# Patient Record
Sex: Female | Born: 1935 | Race: White | Hispanic: No | Smoking: Former smoker
Health system: Southern US, Community
[De-identification: ages and names within clinical notes are randomized; demographics above are authoritative.]

## PROBLEM LIST (undated history)

## (undated) DIAGNOSIS — F419 Anxiety disorder, unspecified: Secondary | ICD-10-CM

## (undated) DIAGNOSIS — M1990 Unspecified osteoarthritis, unspecified site: Secondary | ICD-10-CM

## (undated) DIAGNOSIS — W19XXXA Unspecified fall, initial encounter: Secondary | ICD-10-CM

## (undated) DIAGNOSIS — K2971 Gastritis, unspecified, with bleeding: Secondary | ICD-10-CM

## (undated) DIAGNOSIS — F329 Major depressive disorder, single episode, unspecified: Secondary | ICD-10-CM

## (undated) DIAGNOSIS — K5790 Diverticulosis of intestine, part unspecified, without perforation or abscess without bleeding: Secondary | ICD-10-CM

## (undated) DIAGNOSIS — D649 Anemia, unspecified: Secondary | ICD-10-CM

## (undated) DIAGNOSIS — K2289 Other specified disease of esophagus: Secondary | ICD-10-CM

## (undated) DIAGNOSIS — K31819 Angiodysplasia of stomach and duodenum without bleeding: Secondary | ICD-10-CM

## (undated) DIAGNOSIS — K449 Diaphragmatic hernia without obstruction or gangrene: Secondary | ICD-10-CM

## (undated) DIAGNOSIS — H269 Unspecified cataract: Secondary | ICD-10-CM

## (undated) DIAGNOSIS — F32A Depression, unspecified: Secondary | ICD-10-CM

## (undated) DIAGNOSIS — D539 Nutritional anemia, unspecified: Secondary | ICD-10-CM

## (undated) DIAGNOSIS — E785 Hyperlipidemia, unspecified: Secondary | ICD-10-CM

## (undated) DIAGNOSIS — K635 Polyp of colon: Secondary | ICD-10-CM

## (undated) DIAGNOSIS — R296 Repeated falls: Secondary | ICD-10-CM

## (undated) DIAGNOSIS — Z5189 Encounter for other specified aftercare: Secondary | ICD-10-CM

## (undated) DIAGNOSIS — R011 Cardiac murmur, unspecified: Secondary | ICD-10-CM

## (undated) DIAGNOSIS — K219 Gastro-esophageal reflux disease without esophagitis: Secondary | ICD-10-CM

## (undated) DIAGNOSIS — IMO0001 Reserved for inherently not codable concepts without codable children: Secondary | ICD-10-CM

## (undated) DIAGNOSIS — I350 Nonrheumatic aortic (valve) stenosis: Secondary | ICD-10-CM

## (undated) DIAGNOSIS — I509 Heart failure, unspecified: Secondary | ICD-10-CM

## (undated) DIAGNOSIS — K228 Other specified diseases of esophagus: Secondary | ICD-10-CM

## (undated) DIAGNOSIS — G629 Polyneuropathy, unspecified: Secondary | ICD-10-CM

## (undated) DIAGNOSIS — I10 Essential (primary) hypertension: Secondary | ICD-10-CM

## (undated) DIAGNOSIS — I82409 Acute embolism and thrombosis of unspecified deep veins of unspecified lower extremity: Secondary | ICD-10-CM

## (undated) HISTORY — DX: Other specified disease of esophagus: K22.89

## (undated) HISTORY — PX: TUBAL LIGATION: SHX77

## (undated) HISTORY — PX: CATARACT EXTRACTION, BILATERAL: SHX1313

## (undated) HISTORY — DX: Hyperlipidemia, unspecified: E78.5

## (undated) HISTORY — PX: ORIF WRIST FRACTURE: SHX2133

## (undated) HISTORY — DX: Cardiac murmur, unspecified: R01.1

## (undated) HISTORY — DX: Nutritional anemia, unspecified: D53.9

## (undated) HISTORY — DX: Polyp of colon: K63.5

## (undated) HISTORY — DX: Depression, unspecified: F32.A

## (undated) HISTORY — DX: Essential (primary) hypertension: I10

## (undated) HISTORY — DX: Nonrheumatic aortic (valve) stenosis: I35.0

## (undated) HISTORY — DX: Other specified diseases of esophagus: K22.8

## (undated) HISTORY — DX: Anemia, unspecified: D64.9

## (undated) HISTORY — PX: CHOLECYSTECTOMY: SHX55

## (undated) HISTORY — DX: Major depressive disorder, single episode, unspecified: F32.9

## (undated) HISTORY — DX: Gastritis, unspecified, with bleeding: K29.71

## (undated) HISTORY — DX: Polyneuropathy, unspecified: G62.9

## (undated) HISTORY — DX: Acute embolism and thrombosis of unspecified deep veins of unspecified lower extremity: I82.409

## (undated) HISTORY — DX: Encounter for other specified aftercare: Z51.89

## (undated) HISTORY — DX: Gastro-esophageal reflux disease without esophagitis: K21.9

## (undated) HISTORY — DX: Unspecified cataract: H26.9

## (undated) HISTORY — PX: OTHER SURGICAL HISTORY: SHX169

## (undated) HISTORY — DX: Anxiety disorder, unspecified: F41.9

## (undated) HISTORY — DX: Unspecified osteoarthritis, unspecified site: M19.90

## (undated) HISTORY — DX: Angiodysplasia of stomach and duodenum without bleeding: K31.819

## (undated) HISTORY — DX: Diaphragmatic hernia without obstruction or gangrene: K44.9

## (undated) HISTORY — DX: Diverticulosis of intestine, part unspecified, without perforation or abscess without bleeding: K57.90

## (undated) HISTORY — DX: Reserved for inherently not codable concepts without codable children: IMO0001

## (undated) HISTORY — DX: Heart failure, unspecified: I50.9

---

## 1991-02-06 HISTORY — PX: OTHER SURGICAL HISTORY: SHX169

## 1996-02-06 HISTORY — PX: FOOT SURGERY: SHX648

## 1997-07-07 ENCOUNTER — Other Ambulatory Visit: Admission: RE | Admit: 1997-07-07 | Discharge: 1997-07-07 | Payer: Self-pay | Admitting: Family Medicine

## 1997-07-08 ENCOUNTER — Other Ambulatory Visit: Admission: RE | Admit: 1997-07-08 | Discharge: 1997-07-08 | Payer: Self-pay | Admitting: Family Medicine

## 1997-07-26 ENCOUNTER — Ambulatory Visit (HOSPITAL_COMMUNITY): Admission: RE | Admit: 1997-07-26 | Discharge: 1997-07-26 | Payer: Self-pay | Admitting: Family Medicine

## 1997-08-31 ENCOUNTER — Ambulatory Visit (HOSPITAL_COMMUNITY): Admission: RE | Admit: 1997-08-31 | Discharge: 1997-08-31 | Payer: Self-pay | Admitting: *Deleted

## 1997-09-03 ENCOUNTER — Ambulatory Visit (HOSPITAL_COMMUNITY): Admission: RE | Admit: 1997-09-03 | Discharge: 1997-09-03 | Payer: Self-pay | Admitting: *Deleted

## 1998-03-29 ENCOUNTER — Ambulatory Visit (HOSPITAL_BASED_OUTPATIENT_CLINIC_OR_DEPARTMENT_OTHER): Admission: RE | Admit: 1998-03-29 | Discharge: 1998-03-29 | Payer: Self-pay | Admitting: Podiatry

## 1999-02-11 ENCOUNTER — Encounter: Admission: RE | Admit: 1999-02-11 | Discharge: 1999-02-11 | Payer: Self-pay | Admitting: Orthopedic Surgery

## 1999-02-11 ENCOUNTER — Encounter: Payer: Self-pay | Admitting: Orthopedic Surgery

## 2000-06-18 ENCOUNTER — Ambulatory Visit (HOSPITAL_COMMUNITY): Admission: RE | Admit: 2000-06-18 | Discharge: 2000-06-18 | Payer: Self-pay | Admitting: Family Medicine

## 2000-06-18 ENCOUNTER — Encounter: Payer: Self-pay | Admitting: Family Medicine

## 2000-07-04 ENCOUNTER — Encounter: Payer: Self-pay | Admitting: Family Medicine

## 2000-07-04 ENCOUNTER — Encounter: Admission: RE | Admit: 2000-07-04 | Discharge: 2000-07-04 | Payer: Self-pay | Admitting: Family Medicine

## 2004-01-10 ENCOUNTER — Ambulatory Visit: Payer: Self-pay | Admitting: Family Medicine

## 2004-02-07 ENCOUNTER — Ambulatory Visit: Payer: Self-pay | Admitting: Family Medicine

## 2004-06-29 ENCOUNTER — Ambulatory Visit (HOSPITAL_COMMUNITY): Admission: RE | Admit: 2004-06-29 | Discharge: 2004-06-30 | Payer: Self-pay | Admitting: Otolaryngology

## 2004-07-12 ENCOUNTER — Ambulatory Visit: Payer: Self-pay | Admitting: Family Medicine

## 2004-11-13 ENCOUNTER — Ambulatory Visit: Payer: Self-pay | Admitting: Family Medicine

## 2004-11-15 ENCOUNTER — Ambulatory Visit (HOSPITAL_COMMUNITY): Admission: RE | Admit: 2004-11-15 | Discharge: 2004-11-15 | Payer: Self-pay | Admitting: Family Medicine

## 2005-01-08 ENCOUNTER — Ambulatory Visit: Payer: Self-pay | Admitting: Family Medicine

## 2005-09-25 ENCOUNTER — Ambulatory Visit: Payer: Self-pay | Admitting: Family Medicine

## 2006-08-02 ENCOUNTER — Ambulatory Visit: Payer: Self-pay | Admitting: Family Medicine

## 2006-08-05 DIAGNOSIS — I10 Essential (primary) hypertension: Secondary | ICD-10-CM | POA: Insufficient documentation

## 2006-08-05 DIAGNOSIS — M810 Age-related osteoporosis without current pathological fracture: Secondary | ICD-10-CM | POA: Insufficient documentation

## 2006-08-05 DIAGNOSIS — IMO0002 Reserved for concepts with insufficient information to code with codable children: Secondary | ICD-10-CM

## 2006-08-05 DIAGNOSIS — E785 Hyperlipidemia, unspecified: Secondary | ICD-10-CM

## 2006-08-05 DIAGNOSIS — M1990 Unspecified osteoarthritis, unspecified site: Secondary | ICD-10-CM | POA: Insufficient documentation

## 2006-08-05 DIAGNOSIS — R32 Unspecified urinary incontinence: Secondary | ICD-10-CM

## 2006-08-05 DIAGNOSIS — G608 Other hereditary and idiopathic neuropathies: Secondary | ICD-10-CM

## 2006-08-05 DIAGNOSIS — K219 Gastro-esophageal reflux disease without esophagitis: Secondary | ICD-10-CM | POA: Insufficient documentation

## 2006-08-05 DIAGNOSIS — F341 Dysthymic disorder: Secondary | ICD-10-CM | POA: Insufficient documentation

## 2006-08-05 DIAGNOSIS — D500 Iron deficiency anemia secondary to blood loss (chronic): Secondary | ICD-10-CM

## 2007-02-05 ENCOUNTER — Emergency Department (HOSPITAL_COMMUNITY): Admission: EM | Admit: 2007-02-05 | Discharge: 2007-02-05 | Payer: Self-pay | Admitting: Emergency Medicine

## 2007-07-16 ENCOUNTER — Ambulatory Visit: Payer: Self-pay | Admitting: Family Medicine

## 2007-07-16 LAB — CONVERTED CEMR LAB
Albumin: 4.2 g/dL (ref 3.5–5.2)
CO2: 25 meq/L (ref 19–32)
Chloride: 103 meq/L (ref 96–112)
Creatinine, Ser: 1.27 mg/dL — ABNORMAL HIGH (ref 0.40–1.20)
Glucose, Bld: 84 mg/dL (ref 70–99)
HDL: 75 mg/dL (ref >39)
Hemoglobin: 10.1 g/dL — ABNORMAL LOW (ref 12.0–15.0)
LDL Cholesterol: 63 mg/dL (ref 0–99)
Lymphocytes Relative: 21 % (ref 12–46)
MCV: 93.7 fL (ref 78.0–100.0)
Neutro Abs: 3.4 10*3/uL (ref 1.7–7.7)
RBC: 3.64 M/uL — ABNORMAL LOW (ref 3.87–5.11)
Sodium: 138 meq/L (ref 135–145)
Total CHOL/HDL Ratio: 2.3
VLDL: 32 mg/dL (ref 0–40)
WBC: 5.1 10*3/uL (ref 4.0–10.5)

## 2007-07-31 ENCOUNTER — Ambulatory Visit: Payer: Self-pay | Admitting: Family Medicine

## 2007-09-16 ENCOUNTER — Encounter (INDEPENDENT_AMBULATORY_CARE_PROVIDER_SITE_OTHER): Payer: Self-pay | Admitting: *Deleted

## 2007-09-16 ENCOUNTER — Ambulatory Visit (HOSPITAL_COMMUNITY): Admission: RE | Admit: 2007-09-16 | Discharge: 2007-09-16 | Payer: Self-pay | Admitting: *Deleted

## 2007-12-27 ENCOUNTER — Emergency Department (HOSPITAL_COMMUNITY): Admission: EM | Admit: 2007-12-27 | Discharge: 2007-12-27 | Payer: Self-pay | Admitting: Emergency Medicine

## 2007-12-29 ENCOUNTER — Inpatient Hospital Stay (HOSPITAL_COMMUNITY): Admission: AD | Admit: 2007-12-29 | Discharge: 2008-01-05 | Payer: Self-pay | Admitting: Orthopedic Surgery

## 2007-12-31 ENCOUNTER — Ambulatory Visit: Payer: Self-pay | Admitting: Physical Medicine & Rehabilitation

## 2008-02-09 ENCOUNTER — Inpatient Hospital Stay (HOSPITAL_COMMUNITY): Admission: AD | Admit: 2008-02-09 | Discharge: 2008-02-09 | Payer: Self-pay | Admitting: Internal Medicine

## 2008-03-25 ENCOUNTER — Ambulatory Visit: Payer: Self-pay | Admitting: Family Medicine

## 2008-03-25 LAB — CONVERTED CEMR LAB
BUN: 19 mg/dL (ref 6–23)
Basophils Relative: 0 % (ref 0–1)
Calcium: 9.6 mg/dL (ref 8.4–10.5)
Eosinophils Absolute: 0.1 10*3/uL (ref 0.0–0.7)
HCT: 26.2 % — ABNORMAL LOW (ref 36.0–46.0)
Hemoglobin: 8.2 g/dL — ABNORMAL LOW (ref 12.0–15.0)
MCHC: 31.3 g/dL (ref 30.0–36.0)
MCV: 79.2 fL (ref 78.0–100.0)
Neutrophils Relative %: 73 % (ref 43–77)
Platelets: 318 10*3/uL (ref 150–400)
RBC: 3.31 M/uL — ABNORMAL LOW (ref 3.87–5.11)
Saturation Ratios: 3 % — ABNORMAL LOW (ref 20–55)
Sodium: 130 meq/L — ABNORMAL LOW (ref 135–145)
TIBC: 414 ug/dL (ref 250–470)
WBC: 5.4 10*3/uL (ref 4.0–10.5)

## 2008-12-21 ENCOUNTER — Ambulatory Visit: Payer: Self-pay | Admitting: Family Medicine

## 2008-12-21 LAB — CONVERTED CEMR LAB
Basophils Absolute: 0 10*3/uL (ref 0.0–0.1)
Basophils Relative: 0 % (ref 0–1)
Eosinophils Absolute: 0.2 10*3/uL (ref 0.0–0.7)
Eosinophils Relative: 4 % (ref 0–5)
HCT: 41 % (ref 36.0–46.0)
Hemoglobin: 13.5 g/dL (ref 12.0–15.0)
Lymphocytes Relative: 22 % (ref 12–46)
Lymphs Abs: 1.1 10*3/uL (ref 0.7–4.0)
MCHC: 32.9 g/dL (ref 30.0–36.0)
MCV: 98.3 fL (ref 78.0–100.0)
Monocytes Absolute: 0.5 10*3/uL (ref 0.1–1.0)
Monocytes Relative: 10 % (ref 3–12)
Neutro Abs: 3.2 10*3/uL (ref 1.7–7.7)
Neutrophils Relative %: 64 % (ref 43–77)
Platelets: 252 10*3/uL (ref 150–400)
RBC: 4.17 M/uL (ref 3.87–5.11)
RDW: 12.6 % (ref 11.5–15.5)
Vit D, 25-Hydroxy: 32 ng/mL (ref 30–89)
WBC: 4.9 10*3/uL (ref 4.0–10.5)

## 2009-02-10 ENCOUNTER — Ambulatory Visit: Payer: Self-pay | Admitting: Family Medicine

## 2009-02-10 LAB — CONVERTED CEMR LAB
BUN: 21 mg/dL (ref 6–23)
Calcium: 9.7 mg/dL (ref 8.4–10.5)
Creatinine, Ser: 0.89 mg/dL (ref 0.40–1.20)
Potassium: 4.6 meq/L (ref 3.5–5.3)
Sodium: 135 meq/L (ref 135–145)
Total CHOL/HDL Ratio: 1.8

## 2009-03-15 ENCOUNTER — Ambulatory Visit: Payer: Self-pay | Admitting: Family Medicine

## 2009-06-14 ENCOUNTER — Ambulatory Visit: Payer: Self-pay | Admitting: Family Medicine

## 2009-12-08 ENCOUNTER — Encounter (INDEPENDENT_AMBULATORY_CARE_PROVIDER_SITE_OTHER): Payer: Self-pay | Admitting: Family Medicine

## 2009-12-08 ENCOUNTER — Ambulatory Visit (HOSPITAL_COMMUNITY): Admission: RE | Admit: 2009-12-08 | Discharge: 2009-12-08 | Payer: Self-pay | Admitting: Family Medicine

## 2009-12-08 ENCOUNTER — Ambulatory Visit: Payer: Self-pay | Admitting: Cardiology

## 2010-01-26 ENCOUNTER — Encounter (INDEPENDENT_AMBULATORY_CARE_PROVIDER_SITE_OTHER): Payer: Self-pay | Admitting: Family Medicine

## 2010-01-26 LAB — CONVERTED CEMR LAB
Iron: 26 ug/dL — ABNORMAL LOW (ref 42–145)
Saturation Ratios: 7 % — ABNORMAL LOW (ref 20–55)
TIBC: 382 ug/dL (ref 250–470)
UIBC: 356 ug/dL

## 2010-03-12 ENCOUNTER — Inpatient Hospital Stay (HOSPITAL_COMMUNITY)
Admission: EM | Admit: 2010-03-12 | Discharge: 2010-03-15 | DRG: 378 | Disposition: A | Discharge status: Home / Self Care | Payer: Medicare Other | Attending: Internal Medicine | Admitting: Internal Medicine

## 2010-03-12 ENCOUNTER — Emergency Department (HOSPITAL_COMMUNITY): Payer: Medicare Other

## 2010-03-12 DIAGNOSIS — E785 Hyperlipidemia, unspecified: Secondary | ICD-10-CM | POA: Diagnosis present

## 2010-03-12 DIAGNOSIS — F3289 Other specified depressive episodes: Secondary | ICD-10-CM | POA: Diagnosis present

## 2010-03-12 DIAGNOSIS — D500 Iron deficiency anemia secondary to blood loss (chronic): Secondary | ICD-10-CM | POA: Diagnosis present

## 2010-03-12 DIAGNOSIS — D378 Neoplasm of uncertain behavior of other specified digestive organs: Secondary | ICD-10-CM | POA: Diagnosis present

## 2010-03-12 DIAGNOSIS — I509 Heart failure, unspecified: Secondary | ICD-10-CM | POA: Diagnosis present

## 2010-03-12 DIAGNOSIS — I10 Essential (primary) hypertension: Secondary | ICD-10-CM | POA: Diagnosis present

## 2010-03-12 DIAGNOSIS — D371 Neoplasm of uncertain behavior of stomach: Secondary | ICD-10-CM | POA: Diagnosis present

## 2010-03-12 DIAGNOSIS — K2961 Other gastritis with bleeding: Principal | ICD-10-CM | POA: Diagnosis present

## 2010-03-12 DIAGNOSIS — I359 Nonrheumatic aortic valve disorder, unspecified: Secondary | ICD-10-CM | POA: Diagnosis present

## 2010-03-12 DIAGNOSIS — F329 Major depressive disorder, single episode, unspecified: Secondary | ICD-10-CM | POA: Diagnosis present

## 2010-03-12 DIAGNOSIS — I5022 Chronic systolic (congestive) heart failure: Secondary | ICD-10-CM | POA: Diagnosis present

## 2010-03-12 LAB — CBC
Hemoglobin: 5.4 g/dL — CL (ref 12.0–15.0)
Platelets: 277 10*3/uL (ref 150–400)
RBC: 2.19 MIL/uL — ABNORMAL LOW (ref 3.87–5.11)
RDW: 14.7 % (ref 11.5–15.5)
WBC: 5.2 10*3/uL (ref 4.0–10.5)

## 2010-03-12 LAB — BASIC METABOLIC PANEL
BUN: 30 mg/dL — ABNORMAL HIGH (ref 6–23)
CO2: 25 mEq/L (ref 19–32)
Calcium: 9 mg/dL (ref 8.4–10.5)
Chloride: 104 mEq/L (ref 96–112)
GFR calc non Af Amer: 43 mL/min — ABNORMAL LOW (ref >60)

## 2010-03-12 LAB — POCT CARDIAC MARKERS: CKMB, poc: <1 ng/mL — ABNORMAL LOW (ref 1.0–8.0)

## 2010-03-12 LAB — TROPONIN I: Troponin I: 0.01 ng/mL (ref 0.00–0.06)

## 2010-03-12 LAB — CK TOTAL AND CKMB (NOT AT ARMC): CK, MB: 1.9 ng/mL (ref 0.3–4.0)

## 2010-03-12 LAB — URINALYSIS, ROUTINE W REFLEX MICROSCOPIC
Ketones, ur: NEGATIVE mg/dL
Protein, ur: NEGATIVE mg/dL
Urine Glucose, Fasting: NEGATIVE mg/dL
Urobilinogen, UA: 0.2 mg/dL (ref 0.0–1.0)

## 2010-03-12 LAB — OCCULT BLOOD, POC DEVICE: Fecal Occult Bld: NEGATIVE

## 2010-03-12 LAB — DIFFERENTIAL
Basophils Absolute: 0 10*3/uL (ref 0.0–0.1)
Eosinophils Relative: 1 % (ref 0–5)

## 2010-03-12 LAB — PREPARE RBC (CROSSMATCH)

## 2010-03-13 DIAGNOSIS — D509 Iron deficiency anemia, unspecified: Secondary | ICD-10-CM

## 2010-03-13 DIAGNOSIS — R195 Other fecal abnormalities: Secondary | ICD-10-CM

## 2010-03-13 LAB — COMPREHENSIVE METABOLIC PANEL
ALT: 15 U/L (ref 0–35)
AST: 17 U/L (ref 0–37)
Albumin: 3.4 g/dL — ABNORMAL LOW (ref 3.5–5.2)
Alkaline Phosphatase: 56 U/L (ref 39–117)
BUN: 20 mg/dL (ref 6–23)
CO2: 26 mEq/L (ref 19–32)
Calcium: 9.2 mg/dL (ref 8.4–10.5)
Chloride: 107 mEq/L (ref 96–112)
Creatinine, Ser: 1.17 mg/dL (ref 0.4–1.2)
GFR calc Af Amer: 54 mL/min — ABNORMAL LOW (ref >60)
GFR calc non Af Amer: 45 mL/min — ABNORMAL LOW (ref >60)
Glucose, Bld: 95 mg/dL (ref 70–99)
Potassium: 4 mEq/L (ref 3.5–5.1)
Sodium: 140 mEq/L (ref 135–145)
Total Bilirubin: 0.6 mg/dL (ref 0.3–1.2)
Total Protein: 6.1 g/dL (ref 6.0–8.3)

## 2010-03-13 LAB — IRON AND TIBC
Iron: 11 ug/dL — ABNORMAL LOW (ref 42–135)
Saturation Ratios: 3 % — ABNORMAL LOW (ref 20–55)
TIBC: 412 ug/dL (ref 250–470)

## 2010-03-13 LAB — CARDIAC PANEL(CRET KIN+CKTOT+MB+TROPI)
CK, MB: 1.7 ng/mL (ref 0.3–4.0)
Relative Index: INVALID (ref 0.0–2.5)
Total CK: 73 U/L (ref 7–177)
Troponin I: 0.02 ng/mL (ref 0.00–0.06)

## 2010-03-13 LAB — CBC
HCT: 24.7 % — ABNORMAL LOW (ref 36.0–46.0)
Hemoglobin: 7.9 g/dL — ABNORMAL LOW (ref 12.0–15.0)
MCH: 26.2 pg (ref 26.0–34.0)
MCHC: 32 g/dL (ref 30.0–36.0)
MCV: 82.1 fL (ref 78.0–100.0)
Platelets: 238 10*3/uL (ref 150–400)
RBC: 3.01 MIL/uL — ABNORMAL LOW (ref 3.87–5.11)
RDW: 14.5 % (ref 11.5–15.5)
WBC: 4.5 10*3/uL (ref 4.0–10.5)

## 2010-03-13 LAB — DIFFERENTIAL
Basophils Absolute: 0 10*3/uL (ref 0.0–0.1)
Basophils Relative: 0 % (ref 0–1)
Eosinophils Absolute: 0.1 10*3/uL (ref 0.0–0.7)
Eosinophils Relative: 2 % (ref 0–5)
Lymphocytes Relative: 13 % (ref 12–46)
Lymphs Abs: 0.6 10*3/uL — ABNORMAL LOW (ref 0.7–4.0)
Monocytes Absolute: 0.5 10*3/uL (ref 0.1–1.0)
Monocytes Relative: 10 % (ref 3–12)
Neutro Abs: 3.3 10*3/uL (ref 1.7–7.7)
Neutrophils Relative %: 75 % (ref 43–77)

## 2010-03-13 LAB — HEMOCCULT GUIAC POC 1CARD (OFFICE): Fecal Occult Bld: NEGATIVE

## 2010-03-13 LAB — MAGNESIUM: Magnesium: 2.1 mg/dL (ref 1.5–2.5)

## 2010-03-13 LAB — LACTATE DEHYDROGENASE: LDH: 130 U/L (ref 94–250)

## 2010-03-14 ENCOUNTER — Other Ambulatory Visit: Payer: Self-pay | Admitting: Internal Medicine

## 2010-03-14 DIAGNOSIS — K5790 Diverticulosis of intestine, part unspecified, without perforation or abscess without bleeding: Secondary | ICD-10-CM

## 2010-03-14 DIAGNOSIS — K5730 Diverticulosis of large intestine without perforation or abscess without bleeding: Secondary | ICD-10-CM

## 2010-03-14 DIAGNOSIS — D509 Iron deficiency anemia, unspecified: Secondary | ICD-10-CM

## 2010-03-14 DIAGNOSIS — K31819 Angiodysplasia of stomach and duodenum without bleeding: Secondary | ICD-10-CM

## 2010-03-14 DIAGNOSIS — R195 Other fecal abnormalities: Secondary | ICD-10-CM

## 2010-03-14 DIAGNOSIS — K2971 Gastritis, unspecified, with bleeding: Secondary | ICD-10-CM

## 2010-03-14 DIAGNOSIS — D126 Benign neoplasm of colon, unspecified: Secondary | ICD-10-CM

## 2010-03-14 DIAGNOSIS — K2991 Gastroduodenitis, unspecified, with bleeding: Secondary | ICD-10-CM

## 2010-03-14 HISTORY — DX: Diverticulosis of intestine, part unspecified, without perforation or abscess without bleeding: K57.90

## 2010-03-14 LAB — CROSSMATCH
Unit division: 0
Unit division: 0

## 2010-03-14 LAB — CBC
HCT: 32.5 % — ABNORMAL LOW (ref 36.0–46.0)
MCV: 82.1 fL (ref 78.0–100.0)
RBC: 3.96 MIL/uL (ref 3.87–5.11)
WBC: 5.7 10*3/uL (ref 4.0–10.5)

## 2010-03-14 LAB — SEDIMENTATION RATE: Sed Rate: 13 mm/hr (ref 0–22)

## 2010-03-14 LAB — RETICULOCYTES: Retic Count, Absolute: 63.4 10*3/uL (ref 19.0–186.0)

## 2010-03-15 ENCOUNTER — Encounter (INDEPENDENT_AMBULATORY_CARE_PROVIDER_SITE_OTHER): Payer: Self-pay | Admitting: *Deleted

## 2010-03-15 ENCOUNTER — Encounter: Payer: Self-pay | Admitting: Internal Medicine

## 2010-03-15 ENCOUNTER — Telehealth: Payer: Self-pay | Admitting: Internal Medicine

## 2010-03-15 DIAGNOSIS — K2970 Gastritis, unspecified, without bleeding: Secondary | ICD-10-CM

## 2010-03-15 DIAGNOSIS — K2971 Gastritis, unspecified, with bleeding: Secondary | ICD-10-CM

## 2010-03-15 DIAGNOSIS — K263 Acute duodenal ulcer without hemorrhage or perforation: Secondary | ICD-10-CM

## 2010-03-15 DIAGNOSIS — D1330 Benign neoplasm of unspecified part of small intestine: Secondary | ICD-10-CM

## 2010-03-15 DIAGNOSIS — D500 Iron deficiency anemia secondary to blood loss (chronic): Secondary | ICD-10-CM

## 2010-03-15 DIAGNOSIS — K2990 Gastroduodenitis, unspecified, without bleeding: Secondary | ICD-10-CM

## 2010-03-15 HISTORY — DX: Gastritis, unspecified, with bleeding: K29.71

## 2010-03-15 LAB — PROTEIN ELECTROPH W RFLX QUANT IMMUNOGLOBULINS
Albumin ELP: 57.3 % (ref 55.8–66.1)
Alpha-1-Globulin: 5.9 % — ABNORMAL HIGH (ref 2.9–4.9)
Alpha-2-Globulin: 11.6 % (ref 7.1–11.8)
Gamma Globulin: 9.1 % — ABNORMAL LOW (ref 11.1–18.8)

## 2010-03-15 LAB — HEMOGLOBIN AND HEMATOCRIT, BLOOD: HCT: 30.1 % — ABNORMAL LOW (ref 36.0–46.0)

## 2010-03-15 LAB — TISSUE TRANSGLUTAMINASE, IGG: Tissue Transglut Ab: 6.7 U/mL (ref <20)

## 2010-03-16 LAB — UIFE/LIGHT CHAINS/TP QN, 24-HR UR
Albumin, U: DETECTED
Alpha 2, Urine: DETECTED — AB
Beta, Urine: DETECTED — AB
Free Lambda Lt Chains,Ur: 0.26 mg/dL (ref 0.08–1.01)
Free Lt Chn Excr Rate: 34.63 mg/d
Gamma Globulin, Urine: DETECTED — AB
Total Protein, Urine-Ur/day: 44 mg/d (ref 10–140)

## 2010-03-23 NOTE — Letter (Signed)
Summary: Patient Notice- Polyp Results  [facility]  [place], [place] [address]   Phone: [phone]  Fax: [phone]        March 15, 2010 MRN: [id number]    Sophia [name] [place], [place]  [address]    Dear [name],  I am pleased to inform you that the colon polyp(s) removed during your recent colonoscopy was (were) found to be benign (no cancer detected) upon pathologic examination.The polyp was hyperplactic ( not premalignant)    Should you develop new or worsening symptoms of abdominal pain, bowel habit changes or bleeding from the rectum or bowels, please schedule an evaluation with either your primary care physician or with me.  Additional information/recommendations:  __ No further action with gastroenterology is needed at this time. Please      follow-up with your primary care physician for your other healthcare      needs.  __ Please call [phone] to schedule a return visit to review your      situation.  _x_ Please keep your follow-up visit as already scheduled.  _x_ Continue treatment plan as outlined the day of your exam.  Please call us if you are having persistent problems or have questions about your condition that have not been fully answered at this time.  Sincerely,  Lafayette Dragon MD  This letter has been electronically signed by your physician.  Appended Document: Patient Notice- Polyp Results Letter mailed to patient.

## 2010-03-23 NOTE — Procedures (Signed)
Summary: Colonoscopy  COLONOSCOPY PROCEDURE REPORT  PATIENT:  Sophia [name], Sophia [name]  MR#:  [id number] BIRTHDATE:   Jun 13, 1928, 81 yrs. old   GENDER:   female ENDOSCOPIST:   Lowella Bandy. Olevia Perches, MD REF. BY: Ellsworth Lennox, M.D. PROCEDURE DATE:  03/14/2010 PROCEDURE:  Colonoscopy [id number] ASA CLASS:   Class II INDICATIONS: Iron deficiency anemia last colon 1999, 2009, heme positive stool MEDICATIONS:    Versed 6 mg, Fentanyl 50 mcg  DESCRIPTION OF PROCEDURE:   After the risks benefits and alternatives of the procedure were thoroughly explained, informed consent was obtained.  Digital rectal exam was performed and revealed no rectal masses.   The Pentax Peds Colonoscope [id number] endoscope was introduced through the anus and advanced to the cecum, which was identified by both the appendix and ileocecal valve, without limitations.  The quality of the prep was good, using MoviPrep.  The instrument was then slowly withdrawn as the colon was fully examined. <<PROCEDUREIMAGES>>        <<OLD IMAGES>>  FINDINGS:  A sessile polyp was found. 4 mm sessile polyp at 15 cm The polyp was removed using cold biopsy forceps (see image1).  There were mild diverticular changes in left colon. diverticulosis was found in the sigmoid colon.  This was otherwise a normal examination of the colon (see image4, image2, and image3). tortuous spastic colon, difficult exam   Retroflexed views in the rectum revealed no abnormalities.    The scope was then withdrawn from the patient and the procedure completed.  COMPLICATIONS:   None ENDOSCOPIC IMPRESSION:  1) Sessile polyp  2) Diverticulosis,mild,left sided diverticulosis in the sigmoid colon  3) Otherwise normal examination  nothing to account for heme positive stool, suspect an upper GI source of blood loss RECOMMENDATIONS:  1) Await pathology results  SB capsule endoscopy REPEAT EXAM:   In 5 year(s) for.   _______________________________ Lowella Bandy. Olevia Perches, MD  CC:

## 2010-03-23 NOTE — Procedures (Signed)
Summary: EGD  ENDOSCOPY PROCEDURE REPORT  PATIENT:  Sophia [name], Sophia [name]  MR#:  [id number] BIRTHDATE:   Nov 12, 1928, 81 yrs. old   GENDER:   female  ENDOSCOPIST:   Lowella Bandy. Olevia Perches, MD Referred by: Ellsworth Lennox, M.D.  PROCEDURE DATE:  03/14/2010 PROCEDURE:  EGD with biopsy, [id number] ASA CLASS:   Class II INDICATIONS: iron deficiency anemia, hemoccult positive stool RECURRENT IRON DEFICIENCY ANEMIA, PRIOR NEGATIVE GI WORKUPS  MEDICATIONS:    Versed 4 mg, Fentanyl 50 mcg TOPICAL ANESTHETIC:   Cetacaine Spray  DESCRIPTION OF PROCEDURE:   After the risks benefits and alternatives of the procedure were thoroughly explained, informed consent was obtained.  The Pentax Gastroscope [id number] endoscope was introduced through the mouth and advanced to the second portion of the duodenum, without limitations.  The instrument was slowly withdrawn as the mucosa was fully examined. <<PROCEDUREIMAGES>>              <<OLD IMAGES>>  Moderate gastritis was found in the antrum. hemorrhagic gastritis, low grade bleeding, With standard forceps, a biopsy was obtained and sent to pathology (see image1, image2, and image3).  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 avm vs scope induced trauma, not ablated  Otherwise the examination was normal. With standard forceps, a biopsy was obtained and sent to pathology (see image7). small bowl biopsy to r/o sprue    Retroflexed views revealed no abnormalities.    The scope was then withdrawn from the patient and the procedure completed.  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 stomach may be the source of GIB  avm vs scope induced trauma not ablated RECOMMENDATIONS:  1) Await biopsy results  colonoscopy  if negative, small bowl capsule endoscopy  REPEAT EXAM:   In 0 year(s)  for.   _______________________________ Lowella Bandy. Olevia Perches, MD    CC:

## 2010-03-23 NOTE — Letter (Signed)
Summary: New Patient letter  [facility]  [address] [place], [place] [address]   Phone: [phone]  Fax: [phone]       03/15/2010 MRN: [id number]  Sophia [name] [address] [place], [place]  [address]  Dear Ms. Lucking,  Welcome to the Gastroenterology Division at [facility].    You are scheduled to see Dr.  [name] on April 26, 2010 at 9:00am on the 3rd floor at [facility] [place] [facility].  We ask that you try to arrive at our office 15 minutes prior to your appointment time to allow for check-in.  We would like you to complete the enclosed self-administered evaluation form prior to your visit and bring it with you on the day of your appointment.  We will review it with you.  Also, please bring a complete list of all your medications or, if you prefer, bring the medication bottles and we will list them.  Please bring your insurance card so that we may make a copy of it.  If your insurance requires a referral to see a specialist, please bring your referral form from your primary care physician.  Co-payments are due at the time of your visit and may be paid by cash, check or credit card.     Your office visit will consist of a consult with your physician (includes a physical exam), any laboratory testing he/she may order, scheduling of any necessary diagnostic testing (e.g. x-ray, ultrasound, CT-scan), and scheduling of a procedure (e.g. Endoscopy, Colonoscopy) if required.  Please allow enough time on your schedule to allow for any/all of these possibilities.    If you cannot keep your appointment, please call [phone] to cancel or reschedule prior to your appointment date.  This allows [place] the opportunity to schedule an appointment for another patient in need of care.  If you do not cancel or reschedule by 5 p.m. the business day prior to your appointment date, you will be charged a $50.00 late cancellation/no-show fee.    Thank you for  choosing [facility] for your medical needs.  We appreciate the opportunity to care for you.  Please visit [place] at our website  to learn more about our practice.                     Sincerely,                                                             The Gastroenterology Division

## 2010-03-23 NOTE — Progress Notes (Signed)
Summary: labs in three weeks  Phone Note From Other Clinic   Caller: [place]  Call For: DR Olevia Perches Reason for Call: Schedule Patient Appt Summary of Call: Patient is scheduled to see Dr Olevia Perches on April 26, 2010 and Nevin Bloodgood wants her to have labs (cbc) in three weeks from today, Please put it in the system. Initial call taken by: Ronalee Red,  March 15, 2010 10:39 AM  Follow-up for Phone Call        Lmom for patient to return our call. Patient has a lab appointment in 3 weeks and then a f/u with Dr Donna Christen. Shella Maxim RN  March 15, 2010 2:31 PM   Additional Follow-up for Phone Call Additional follow up Details #1::        Spoke with patient and gave her the date for labs 04/05/10. Additional Follow-up by: Leone Payor RN,  March 17, 2010 8:39 AM

## 2010-03-27 ENCOUNTER — Telehealth: Payer: Self-pay | Admitting: Internal Medicine

## 2010-03-27 NOTE — Discharge Summary (Signed)
Sophia [name], COMMINS NO.:  [id number]  MEDICAL RECORD NO.:  [id number]           PATIENT TYPE:  I  LOCATION:  1520                         FACILITY:  [facility]  PHYSICIAN:  Oren Binet, MD    DATE OF BIRTH:  1928-09-05  DATE OF ADMISSION:  03/12/2010 DATE OF DISCHARGE:                        DISCHARGE SUMMARY - REFERRING   PRIMARY CARE PRACTITIONER:  Barton Fanny, M.D.  PRIMARY GASTROENTEROLOGIST:  Lowella Bandy. Olevia Perches, M.D.  PRIMARY CARDIOLOGIST:  Belva Crome, M.D. of [facility].  PRIMARY DISCHARGE DIAGNOSES: 1. Anemia, likely secondary to chronic gastrointestinal loss. 2. Hemorrhagic gastritis per esophagogastroduodenoscopy.  SECONDARY DISCHARGE DIAGNOSES: 1. Congestive heart failure, mild systolic, compensated currently. 2. Moderate aortic stenosis. 3. Aortic stenosis, moderate. 4. Hypertension. 5. Dyslipidemia. 6. Depression.  DISCHARGE MEDICATIONS: 1. Ferrous sulfate 325 mg 1 tablet p.o. twice daily. 2. Lisinopril 5 mg 1 tablet daily. 3. Xanax 0.5 mg half to one tablet p.o. 3 times a day p.r.n. 4. Protonix 40 mg p.o. daily. 5. EpiPen injection 1 injection subcutaneously daily p.r.n. for hornet     stings. 6. Flonase nasal spray 2 sprays nasally at bedtime. 7. [name] 40 mg 1 tablet p.o. weekly. 8. Neurontin 400 mg 1 capsule p.o. 3 times a day. 9. Hydrocodone/APAP 5/500 half to one tablet p.o. at bedtime. 10.Lidoderm 5% one to three patches topically every 12 hours. 11.Lipitor 40 mg 1 tablet p.o. at bedtime. 12.Verapamil sustained release 240 mg 1 tablet p.o. q.a.m. Mondays,     Wednesdays, and Fridays. 13.Vitamin B12 of 1000 mg 2 tablets p.o. daily. 14.Vitamin D3 of 1000 units 1 tablet p.o. daily. 15.Wellbutrin XL 150 mg p.o. twice daily.  CONSULTATION:  Lowella Bandy. Olevia Perches, M.D. from [facility].  BRIEF HPI:  The patient is a very pleasant 77 year old female who presented to the ED on March 12, 2010, with generalized  weakness. This was going on and off for the past 1 month.  On further evaluation in the emergency room, she was found to have profound anemia with a hemoglobin of 5.4.  She had no evidence of active ongoing GI loss, and she was admitted to the hospitalist service for further evaluation and treatment.  PERTINENT LABORATORY DATA: 1. Hemoglobin on discharge is 9.7. 2. ESR is 13. 3. TSH was 0.879. 4. Fecal occult blood x2 was negative. 5. Anemia, showed an iron level of 11, total iron binding capacity of     412, percent saturation of 3, vitamin B12 of 1206.  RADIOLOGICAL STUDIES:  X-ray of the chest showed stable examination.  No acute cardiopulmonary process.  PROCEDURES PERFORMED:  The patient underwent an EGD and colonoscopy done that was done by Dr. [name] on March 14, 2010.  The EGD showed moderate gastritis in the antrum.  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  The colonoscopy was negative except for a sessile polyp and diverticulosis.  Pathology results from EGD and colonoscopy are currently pending and will need to be followed up.  BRIEF HOSPITAL COURSE: 1. Anemia.  This was profound microcytic with features suggestive of     iron deficiency.  Review of the patient's record did show she had a  EGD and colonoscopy done by Dr. Lajoyce Corners in 2009 which was by and large     negative.  She was admitted to the hospitalist service and given 4     units of PRBC for her profound anemia.  Since the suspicion was for     GI loss, gastroenterology was consulted and the patient was seen in     consult by Dr. Olevia Perches.  She then subsequently underwent an EGD     which basically showed moderate hemorrhagic gastritis with low     grade bleeding in the stomach, biopsy results of which are     currently pending.  Her colonoscopy showed a sessile polyp and     diverticulosis, the biopsy results of which are also pending.  The     patient's hemoglobin and hematocrit have been stable status post      transfusion.  Please do note that this patient's FOBT x3 was     negative, and she does not have any active bleeding.  She has been     placed on proton pump inhibitor and she will continue this.  She     will also continue iron supplementation twice daily.  She also     subsequently underwent a small bowel video capsule, the official     report of which is currently pending.  Per GI note today, the     patient can be discharged to follow up with Dr. Olevia Perches on April 26, 2010, at 9:00 a.m.  She will also have blood work done in 3 weeks'     time at [facility] as well. 2. Hypertension.  This was controlled.  Given her history of mild     systolic dysfunction, she has been placed on lisinopril. 3. Aortic stenosis.  She is to continue regular followup with Dr.     Daneen Schick. 4. Dyslipidemia.  The patient is to continue on a statin.  DISPOSITION:  The patient is considered currently stable to be discharged home.  FOLLOWUP INSTRUCTIONS: 1. The patient will follow up with Dr. Olevia Perches on April 26, 2010, at     9:00 a.m.  She also will follow up with Dr. Olevia Perches in 3 weeks' time     for lab work as well. 2. She will follow up with Dr. Daneen Schick at the next scheduled     appointment. 3. She will follow up Dr. Leward Quan within 1-2 weeks upon discharge. 4. The patient's EGD and colonoscopy biopsy results are currently     pending and this will need to be followed up     when she follows up with her primary physicians. 5. Her small bowel capsule endoscopy is also currently pending and     will need followup for that as well.  TIME SPENT:  45 minutes.     Oren Binet, MD     SG/MEDQ  D:  03/15/2010  T:  03/15/2010  Job:  [id number]  cc:   Lynnell Chad. Shelia Media, M.D. Fax: [phone]  Barton Fanny, M.D. Fax: [place]. Olevia Perches, [place] [address] [place] [address]  Belva Crome, M.D. Fax: [phone]  Electronically Signed by Oren Binet  on  03/27/2010 04:24:18 PM

## 2010-04-01 NOTE — H&P (Signed)
NAMELASONIA, CASAS NO.:  [id number]  MEDICAL RECORD NO.:  [id number]           PATIENT TYPE:  I  LOCATION:  [id number]                         FACILITY[facility]  [facility]  PHYSICIAN:  Ulice Bold, MD   DATE OF BIRTH:  Feb 07, 1928  DATE OF ADMISSION:  03/12/2010 DATE OF DISCHARGE:                             HISTORY & PHYSICAL   CHIEF COMPLAINT:  Generalized weakness.  HISTORY OF PRESENT ILLNESS:  The patient is a 77 year old white female with a past medical history of hypertension, GERD, anemia who presented to the ER with a chief complaint of weakness.  History of present illness dates back to 1 month ago when the patient started having weakness.  The patient describes it as generalized weakness.  The patient does not complain of any exacerbating or relieving factors.  The patient is known to have a history of anemia.  Her last admission and transfusion was in 2010.  The patient's last hemoglobin check was before Christmas, 2011, in which the hemoglobin was told to be 8.  The patient was started on iron supplement, but the patient continued to have progressive weakness and that is the reason she came to the ER.  No complaint of nausea, vomiting, diarrhea.  No complaint of bright red blood per rectum.  No complaint of melena.  No complaint of hematemesis. No complaint of chest pain, shortness of breath.  No complaint of fever, cough, or chills.  No complaint of dizziness, loss of consciousness, or fall.  REVIEW OF SYSTEMS:  Positive for malaise.  ALLERGIES:  The patient is allergic to ACTONEL.  SOCIAL HISTORY:  The patient lives alone.  Nonsmoker.  Nondrinker. Drives.  The patient is able to perform her ADLs and IADLs.  PAST MEDICAL HISTORY:  Positive for: 1. Hypertension. 2. GERD. 3. Anemia. 4. Hiatal hernia. 5. Depression. 6. Hypercholesterolemia. 7. Aortic stenosis. 8. The patient is status post EGD and status post colonoscopy in 2009.  PHYSICAL  EXAMINATION:  VITAL SIGNS:  Blood pressure 129/73, pulse 86, respiratory rate 21, temperature afebrile. GENERAL:  The patient is awake; alert; oriented to time, place and person; is pale looking; resting comfortably on the bed. HEENT:  Conjunctivae is pale.  Sclerae anicteric.  Pupils equally reactive to light and accommodation.  Head atraumatic, normocephalic. RESPIRATORY:  No acute respiratory distress. CHEST:  Clear to auscultation bilaterally.  No crackles or rhonchi were auscultated. CVS:  S1, S2.  Regular rate and rhythm.  Systolic ejection murmur 2/6 was auscultated. GIT:  Bowel sounds present. ABDOMEN:  Soft, nontender, nondistended.  She was FOBT negative as per the ER records. RECTAL:  Deferred. EXTREMITIES:  No lower extremity edema. CNS:  Cranial nerves II through XII were grossly intact.  LABORATORY DATA:  Sodium 136, potassium 4.9, chloride 104, bicarb 25, BUN 30, serum creatinine 1.2, glucose 113, calcium 9, troponin 0.05. EKG shows normal sinus rhythm.  The patient's hemoglobin was 5.4 and the patient's peripheral blood smear had large platelets, stomatocytes and ovalocytes.  IMPRESSION: 1. Anemia.  The patient had a history of anemia, for which GI workup     was done in 2009, which was  nonsignificant besides hemorrhoids.     Her FOBT is negative this evening with her labs showing large     platelets plus stomatocytes plus oval cells plus the patient's age     of more than 39, this could possibly be myelodysplastic syndrome.     We will transfuse 4 units of PRBC.  We will check iron, TIBC,     ferritin, B12, folate.  Before the transfusion, we will get SPEP     and UPEP.  The patient's further clinical course during the     hospital stay will decide whether she needs more workup for her     possible myelodysplastic syndrome/GI as an inpatient. 2. Hypertension.  The patient has a history of hypertension.  We will     decrease the dose of verapamil at this time, as  the patient is     severely anemic. 3. Cardiovascular:  The patient was recently diagnosed with aortic     stenosis, now the hemoglobin is very low.  We will admit to Tele     and cycle troponins. 4. Deep venous thrombosis.  We will keep the patient on Venodyne for     deep venous thrombosis prophylaxis. 5. Osteoporosis.  We will hold the [name] at this time.  Further clinical course depends upon how the patient's clinical situation changes during the hospital stay.     Ulice Bold, MD     MB/MEDQ  D:  03/12/2010  T:  03/13/2010  Job:  [id number]  Electronically Signed by Ulice Bold M.D. on 04/01/2010 08:57:08 AM

## 2010-04-04 NOTE — Procedures (Signed)
Summary: CAPSULE ENDOSCOPY REPORT   ORDERED BY: [name], MD READ BY: Barrington Ellison, MD  REASON FOR REFERRAL: 77 YO FEMALE ADMITTED WITH WEAKNESS AND HGB 5.4. EGD WAS DONE ON 03/14/10 SHOWING SEVERE HEMORRHAGIC GASTRITIS. COLONOSCOPY WITH POYLP AND DIVERTICULOSIS. FE STUDIES CONSISTENT WITH AN IRON DEFICIENCY.  PATIENT DATA: Height: 60.0inches. Weight: 175 lbs. Waist: 36.0inches, Build: Stocky. Gastric Passage time: 0h 45m. Small Bowel Passage Time: 3h 36m.  PROCEDURE INFO & FINDINGS: 1)COMPLETE STUDY, GOOD PREP 2)MARKED HEMORRHAGIC GASTRITIS 3) DUODENTIS 4) A FEW BENIGN-APPEARING DUODENAL POLYPS 5) AT ONE HOUR 34 MINUTES THERE IS AN EXCAVATED LESION WITH DARK PIGMENT IN CENTER CONSISTANT WITH A SMALL, BENIGN-APPEARING SMALL BOWEL ULCER AND THERE IS A NEARBY EROSION  SUMMARY & RECOMMENDATIONS: PER DR. Olevia Perches NEED TO CONSIDER STOPPING [name] GIVEN THE SMALL BOWEL ULCER (AND AVOIDING NSAIDS IF USING)

## 2010-04-04 NOTE — Progress Notes (Signed)
Summary: refills  Phone Note Call from Patient Call back at Home Phone [phone]   Caller: Patient Call For: Dr Olevia Perches Reason for Call: Talk to Nurse Summary of Call: Patient needs refills for her iron meds, pantoprazole and lisinopril  that will run out on 3-9 states that her appt is on 3-28. Initial call taken by: Ronalee Red,  March 27, 2010 12:41 PM  Follow-up for Phone Call        Dr Olevia Perches, I realize this patient needs to get lisinopril from her PCP, however, she is also requesting pantoprazole and iron supplement until her appointment with our office. You have only seen her in hospital for ENDO/COLON and capsule endo. Would you like me to send in medication until her appointment or have her get it from her PCP until she sees you in the office? Follow-up by: Madlyn Frankel CMA Deborra Medina),  March 27, 2010 1:43 PM  Additional Follow-up for Phone Call Additional follow up Details #1::        please refill Additional Follow-up by: Lafayette Dragon MD,  March 27, 2010 2:02 PM    Additional Follow-up for Phone Call Additional follow up Details #2::    Per Dr Olevia Perches, pantoprazole 40 mg once daily and iron once daily. I have advised patient that we will send in these two prescriptions but that she needs to contact her PCP for lisinopril. Follow-up by: Madlyn Frankel CMA Deborra Medina),  March 27, 2010 5:31 PM  New/Updated Medications: PROTONIX 40 MG  TBEC (PANTOPRAZOLE SODIUM) Take 1 tablet by mouth once a day FERROUS SULFATE 325 (65 FE) MG TBEC (FERROUS SULFATE) Take 1 tablet by mouth once a day Prescriptions: FERROUS SULFATE 325 (65 FE) MG TBEC (FERROUS SULFATE) Take 1 tablet by mouth once a day  #30 x 0   Entered by:   Madlyn Frankel CMA (AAMA)   Authorized by:   Lafayette Dragon MD   Signed by:   [place] ([place]) on 03/27/2010   Method used:   Electronically to        [facility]  [id number]* (retail)       [address]    [place], [place]  [address]       Ph: [phone] or [phone]       Fax: [phone]   RxIDUM:[id number] PROTONIX 40 MG  TBEC (PANTOPRAZOLE SODIUM) Take 1 tablet by mouth once a day  #30 x 0   Entered by:   [place] ([place])   Authorized by:   Lafayette Dragon MD   Signed by:   Madlyn Frankel CMA ([place]) on 03/27/2010   Method used:   Electronically to        [facility]  [id number]* (retail)       [address]       [place], [place]  [address]       Ph: [phone] or [phone]       Fax: [phone]   RxID:   [id number]

## 2010-04-05 ENCOUNTER — Other Ambulatory Visit: Payer: Medicare Other

## 2010-04-05 ENCOUNTER — Other Ambulatory Visit: Payer: Self-pay | Admitting: Internal Medicine

## 2010-04-05 ENCOUNTER — Encounter (INDEPENDENT_AMBULATORY_CARE_PROVIDER_SITE_OTHER): Payer: Self-pay | Admitting: *Deleted

## 2010-04-05 DIAGNOSIS — D508 Other iron deficiency anemias: Secondary | ICD-10-CM

## 2010-04-05 LAB — CBC WITH DIFFERENTIAL/PLATELET
Basophils Absolute: 0 10*3/uL (ref 0.0–0.1)
Basophils Relative: 0.3 % (ref 0.0–3.0)
Eosinophils Relative: 1.1 % (ref 0.0–5.0)
HCT: 28.6 % — ABNORMAL LOW (ref 36.0–46.0)
Hemoglobin: 9.6 g/dL — ABNORMAL LOW (ref 12.0–15.0)
Lymphocytes Relative: 12.8 % (ref 12.0–46.0)
Lymphs Abs: 0.8 10*3/uL (ref 0.7–4.0)
Monocytes Relative: 8 % (ref 3.0–12.0)
Neutro Abs: 4.8 10*3/uL (ref 1.4–7.7)
RBC: 3.27 Mil/uL — ABNORMAL LOW (ref 3.87–5.11)
RDW: 22.9 % — ABNORMAL HIGH (ref 11.5–14.6)

## 2010-04-25 NOTE — Op Note (Signed)
Summary: COLON (Dr Lajoyce Corners)  NAME:  Sophia [name], Sophia [name]             ACCOUNT NO.:  [id number]      MEDICAL RECORD NO.:  [id number]          PATIENT TYPE:  AMB      LOCATION:  ENDO                         FACILITY:  [facility]      PHYSICIAN:  Waverly Ferrari, M.D.    DATE OF BIRTH:  August 02, 1928      DATE OF PROCEDURE:  09/16/2007   DATE OF DISCHARGE:                                  OPERATIVE REPORT      PROCEDURE:  Colonoscopy.      INDICATIONS:  Anemia, rule out GI bleed.      ANESTHESIA:  Fentanyl 50 mcg, Versed 4 mg.      PROCEDURE:  With the patient mildly sedated in the left lateral   decubitus position, Pentax videoscopic colonoscope was inserted in the   rectum and passed under direct vision with pressure applied to the   cecum, identified by ileocecal valve and appendiceal orifice, both of   which were photographed.  From this point, the colonoscope was slowly   withdrawn taking circumferential views of colonic mucosa, stopping to   suction greenish fecal debris as we withdrew, stopping only in the   rectum which appeared normal on direct and showed hemorrhoids on   retroflexed view.  The endoscope was straightened and withdrawn.  The   patient's vital signs and pulse oximeter remained stable.  The patient   tolerated procedure well without apparent complications.      FINDINGS:  Some thickening and diverticula noted in the sigmoid colon.   Internal hemorrhoids.  Otherwise an unremarkable exam.  The hemorrhoids   were fairly marked.      PLAN:  See endoscopy note for further details.                  ______________________________   Waverly Ferrari, M.D.            GMO/MEDQ  D:  09/16/2007  T:  09/16/2007  Job:  [id number]      cc:   Barton Fanny, M.D.   Fax: [phone]

## 2010-04-25 NOTE — Op Note (Signed)
Summary: Endoscopy (Dr Orr)/bx  NAME:  Sophia [name], Sophia [name]             ACCOUNT NO.:  [id number]      MEDICAL RECORD NO.:  [id number]          PATIENT TYPE:  AMB      LOCATION:  ENDO                         FACILITY:  [facility]      PHYSICIAN:  Waverly Ferrari, M.D.    DATE OF BIRTH:  03-Oct-1928      DATE OF PROCEDURE:   DATE OF DISCHARGE:                                  OPERATIVE REPORT      PROCEDURE:  Upper endoscopy.      INDICATIONS:  Gastroesophageal reflux disease, hemoccult positivity,   presumably.      ANESTHESIA:  Fentanyl 50 mcg, Versed 6 mg.      PROCEDURE IN DETAIL:  With the patient mildly sedated in the left   lateral decubitus position, the Pentax videoscopic endoscope was   inserted in the mouth, passed under direct vision into the esophagus,   where I saw a fair amount of esophageal spasm, but was able to advance   the endoscope of the plate was able to of that and advance the endoscope   to the GE junction, where a hiatal hernia was noted from above and   photographed.  The endoscope was advanced into stomach.  Fundus, body,   antrum were visualized and the antrum was noted to have diffuse erythema   which was photographed and biopsied.  Duodenal bulb, second portion   duodenum appeared normal.  From this point, the endoscope was slowly   withdrawn, taking circumferential views of duodenal mucosa until the   endoscope had been pulled back into stomach, placed in retroflexion to   view the stomach from below and it was noted that there may be Barrett's   esophagus on retroflexed views, so biopsies were taken of this area   after photographing it as well.  The endoscope was then straightened and   withdrawn, taking circumferential views of the remaining gastric and   esophageal mucosa.  Once again, the hiatal hernia and the esophageal   spasm was once again noted.  The endoscope was withdrawn.  The patient's   vital signs, pulse oximeter remained stable.  The patient  tolerated   procedure well without apparent complications.      FINDINGS:  Esophageal spasm with above hiatal hernia.  Question of   Barrett's esophagus, biopsied and antral erythema, biopsied.  Await   biopsy reports.  The patient will call me for results and followup with   me as an outpatient.                  ______________________________   Waverly Ferrari, M.D.            GMO/MEDQ  D:  09/16/2007  T:  09/16/2007  Job:  [id number]      cc:   Barton Fanny, M.D.   Fax: [phone]    FINAL DIAGNOSIS    ***MICROSCOPIC EXAMINATION AND DIAGNOSIS***    1. 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   - THERE  IS NO EVIDENCE OF DYSPLASIA OR MALIGNANCY.   - SEE COMMENT.    2. STOMACH, ANTRAL, BIOPSY:   - REACTIVE GASTROPATHY.   - THERE IS NO EVIDENCE OF DYSPLASIA OR MALIGNANCY.   - SEE COMMENT.    COMMENT   1. An Alcian Blue stain is performed to determine the presence   of intestinal metaplasia (goblet cell metaplasia). The Alcian   Blue stain shows intestinal metaplasia (goblet cell metaplasia).   The control stained appropriately.   Squamous lined mucosa is not identified. Therefore, if this   biopsy specimen is from the esophagogastric junction, it likely   represents Barrett' s esophagus. However, if the biopsy specimen   is from gastric mucosa, it likely represents chronic gastritis   with associated intestinal metaplasia. Endoscopic correlation is   recommended. Nonetheless, malignant features are not identified.   (JBK:cdc 09/17/07)    2. This pattern can be associated with nonsteroidal   anti-inflammatory drugs (NSAIDS), alcohol or with other causes of   chemical gastropathy. Helicobacter pylori are not identified   with Warthin-Starry stain. The control stained appropriately.   Features of gastric antral vascular ectasia (GAVE) are not   identified. (JBK:mw, 09/17/07)    cc   Date Reported: 09/17/2007  Vonna Kotyk B. Lyndon Code, MD   *** Electronically Signed Out By JBK ***    Clinical information   1) R/O Barrett' s and 2) r/o gastritis, r/o GAVE (tc)    specimen(s) obtained   1: Esophagus, biopsy   2: Stomach, biopsy, antral    Gross Description   1. Received in formalin are tan, soft tissue fragments that are   submitted in toto. Number: Two   Size: 0.1 and 0.2 cm    2. Received in formalin are tan, soft tissue fragments that are   submitted in toto. Number: Two   Size: 0.3 and 0.5 cm (GP:mj 09/16/07)    mj/   Additional Information  [id number] RESULT STATUS : F  External IF Update Timestamp : 2007-09-16:10:07:00.000000

## 2010-04-26 ENCOUNTER — Ambulatory Visit: Payer: Medicare Other | Admitting: Internal Medicine

## 2010-05-03 ENCOUNTER — Ambulatory Visit (INDEPENDENT_AMBULATORY_CARE_PROVIDER_SITE_OTHER): Payer: Medicare Other | Admitting: Internal Medicine

## 2010-05-03 ENCOUNTER — Encounter: Payer: Self-pay | Admitting: Internal Medicine

## 2010-05-03 ENCOUNTER — Other Ambulatory Visit (INDEPENDENT_AMBULATORY_CARE_PROVIDER_SITE_OTHER): Payer: Medicare Other

## 2010-05-03 DIAGNOSIS — D649 Anemia, unspecified: Secondary | ICD-10-CM

## 2010-05-03 DIAGNOSIS — E319 Polyglandular dysfunction, unspecified: Secondary | ICD-10-CM

## 2010-05-03 DIAGNOSIS — K2270 Barrett's esophagus without dysplasia: Secondary | ICD-10-CM

## 2010-05-03 LAB — CBC WITH DIFFERENTIAL/PLATELET
Basophils Relative: 0.5 % (ref 0.0–3.0)
Eosinophils Relative: 0.8 % (ref 0.0–5.0)
Hemoglobin: 9.7 g/dL — ABNORMAL LOW (ref 12.0–15.0)
MCV: 91.6 fl (ref 78.0–100.0)
Monocytes Absolute: 0.5 10*3/uL (ref 0.1–1.0)
Neutro Abs: 3.5 10*3/uL (ref 1.4–7.7)
Neutrophils Relative %: 70.5 % (ref 43.0–77.0)
RBC: 3.12 Mil/uL — ABNORMAL LOW (ref 3.87–5.11)
WBC: 5 10*3/uL (ref 4.5–10.5)

## 2010-05-03 LAB — IBC PANEL
Saturation Ratios: 6.3 % — ABNORMAL LOW (ref 20.0–50.0)
Transferrin: 329.3 mg/dL (ref 212.0–360.0)

## 2010-05-03 MED ORDER — PANTOPRAZOLE SODIUM 40 MG PO TBEC: 40.0000 mg | DELAYED_RELEASE_TABLET | Freq: Every day | ORAL | Status: DC

## 2010-05-03 MED ORDER — FERROUS SULFATE 325 (65 FE) MG PO TABS: ORAL_TABLET | ORAL | Status: DC

## 2010-05-03 NOTE — Patient Instructions (Addendum)
You have been scheduled for an endoscopy. Please follow separate instructions given to you at your visit today. Your physician has requested that you go to the basement for the following lab work before leaving today: CBC, IBC panel. CC: Dr Estanislado Pandy, Dr Leward Quan

## 2010-05-03 NOTE — Progress Notes (Signed)
Sophia [name] [birth year] MRN [id number]   History of Present Illness:  This is a 77 year old, white female who was recently hospitalized for severe anemia. She had a hemoglobin of 5.4. She was iron deficient. She has been on iron supplements including ferrous sulfate twice a day and has been feeling much better. Her energy level has increased. A small bowel capsule endoscopy showed severe gastritis. She has a history of Barrett's esophagus on an upper endoscopy in 2009. Her most recent upper endoscopy in February 2012 showed no Barrett's. A colonoscopy showed polyps and diverticulosis. She has been on [name] for osteoporosis and is followed by Dr Estanislado Pandy for this.   Past Medical History  Diagnosis Date  . Congestive heart failure   . Aortic stenosis   . Hypertension   . Dyslipidemia   . Depression   . Duodenitis 03/15/10    per capsule endoscopy report  . Hemorrhagic gastritis 03/15/10    per capsule endoscopy report  . Ulceration of intestine 03/15/10    small bowel-seen on capsule endoscopy report  . Diverticulosis 03/14/10  . Hyperplastic colon polyp   . Hiatal hernia   . Anemia   . Osteoporosis   . DVT (deep venous thrombosis)   . Anxiety    Past Surgical History  Procedure Date  . Cholecystectomy   . Orif wrist fracture   . Fracture left knee 1993    x 2   . Foot surgery 1998  . Tubal ligation   . Plantar fasciitis repair     left    reports that she has quit smoking. She has never used smokeless tobacco. She reports that she does not drink alcohol or use illicit drugs. family history includes Uterine cancer in her sister. Allergies  Allergen Reactions  . [name]   . [name]         Review of Systems: Negative for dysphagia, abdominal pain nausea or vomiting. Negative for pedal edema or shortness of breath  The remainder of the 10  point ROS is negative except as outlined in H&P   Physical Exam: General appearance  Well developed, in  no distress. Eyes- non icteric. HEENT nontraumatic, normocephalic. Mouth no lesions, tongue papillated, no cheilosis. Neck supple without adenopathy, thyroid not enlarged, no carotid bruits, no JVD. Lungs Clear to auscultation bilaterally. Cor normal S1, normal S2, regular rhythm , 2/6 systolic murmur,  quiet precordium. Abdomen soft mildly obese abdomen with normoactive bowel sounds. No tenderness. No distention. No bruits. Liver edge at costal margin. Rectal: Normal rectal sphincter tone, soft Hemoccult negative stool. Extremities no pedal edema. Skin no lesions. Neurological alert and oriented x 3. Psychological normal mood and affect.  Assessment and Plan:  Problems #1- severe iron deficiency anemia. We will recheck a CBC today. Her anemia is most likely related to severe gastritis which could have been caused by [name]. She has to stay on [name] due to osteoporosis. She is to continue on Protonix 40 mg daily and iron supplements.   Problem #2- History of Barrett's esophagus. Patient has a history of this in 2009. It was not reproduced in February 2012. There was  severe gastritis on a small bowel capsule endoscopy. This needs to be rechecked due to persistent gastritis. If it continues, I would strongly consider stopping [name] and will discuss alternative medication with Dr Estanislado Pandy.   Plan  Repeat upper endoscopy to follow up on hemorrhagic gastritis. Continue Protonix 40 mg daily. Continue ferrous sulfate 325 mg twice a day.  If her iron level and CBC is back to normal, she may be able to decrease her iron supplements to one a day.      05/03/2010 [name]

## 2010-05-04 ENCOUNTER — Telehealth: Payer: Self-pay | Admitting: *Deleted

## 2010-05-04 ENCOUNTER — Other Ambulatory Visit: Payer: Self-pay | Admitting: *Deleted

## 2010-05-04 MED ORDER — IRON DEXTRAN 50 MG/ML IJ SOLN: INTRAMUSCULAR | Status: DC

## 2010-05-04 MED ORDER — FERROUS SULFATE 325 (65 FE) MG PO TABS: ORAL_TABLET | ORAL | Status: DC

## 2010-05-04 NOTE — Telephone Encounter (Signed)
Message copied by Leone Payor on Thu May 04, 2010  8:31 AM ------      Message from: [name]      Created: Wed May 03, 2010  5:19 PM       Please call pt with very low Hgb 9.7, , was 9.6 1 month ago. Iron level still extremely low, despite of taking her FeSO4 325 bid. Please increase to tid and set up an Iron infusion ,Imfed 25mg IV test dose followed by infusion over 4 hrs of Pharmacy calculated dese.

## 2010-05-04 NOTE — Telephone Encounter (Signed)
Spoke with patient and gave her lab results and Dr. Nichola Sizer recommendations. She will increase the Ferrous Sulfate to TID. Pharmacy notified. Scheduled patient to go to [facility] short stay on 05/15/10 at 9:00 AM. Orders faxed to short stay.

## 2010-05-04 NOTE — Telephone Encounter (Signed)
Faxed orders to short stay at [facility]

## 2010-05-04 NOTE — Telephone Encounter (Signed)
Message copied by Leone Payor on Thu May 04, 2010  9:24 AM ------      Message from: [name]      Created: Wed May 03, 2010  5:19 PM       Please call pt with very low Hgb 9.7, , was 9.6 1 month ago. Iron level still extremely low, despite of taking her FeSO4 325 bid. Please increase to tid and set up an Iron infusion ,Imfed 25mg IV test dose followed by infusion over 4 hrs of Pharmacy calculated dese.

## 2010-05-10 ENCOUNTER — Other Ambulatory Visit: Payer: Self-pay | Admitting: Internal Medicine

## 2010-05-10 ENCOUNTER — Encounter: Payer: Self-pay | Admitting: Internal Medicine

## 2010-05-10 ENCOUNTER — Telehealth: Payer: Self-pay | Admitting: Internal Medicine

## 2010-05-10 NOTE — Telephone Encounter (Signed)
Patient notified that Dr. Olevia Perches will examine her hernia tomorrowl

## 2010-05-10 NOTE — Telephone Encounter (Signed)
Patient calling because she wants Dr. Olevia Perches to check her hernia tomorrow when she has the EGD. Patient states she has a runny nose every morning and her daughter told her it was because of her hernia.

## 2010-05-10 NOTE — Telephone Encounter (Signed)
Yes, I am planning to check her esophagus and stomach during the endoscopy tomorrow. It is part of the exam.

## 2010-05-11 ENCOUNTER — Encounter: Payer: Self-pay | Admitting: Internal Medicine

## 2010-05-11 ENCOUNTER — Other Ambulatory Visit: Payer: Self-pay | Admitting: Internal Medicine

## 2010-05-11 ENCOUNTER — Ambulatory Visit (AMBULATORY_SURGERY_CENTER): Payer: Medicare Other | Admitting: Internal Medicine

## 2010-05-11 VITALS — BP 128/73 | HR 74 | Temp 97.9°F | Resp 18 | Ht 60.0 in | Wt 180.0 lb

## 2010-05-11 DIAGNOSIS — K2990 Gastroduodenitis, unspecified, without bleeding: Secondary | ICD-10-CM

## 2010-05-11 DIAGNOSIS — D509 Iron deficiency anemia, unspecified: Secondary | ICD-10-CM

## 2010-05-11 DIAGNOSIS — K2961 Other gastritis with bleeding: Secondary | ICD-10-CM

## 2010-05-11 DIAGNOSIS — K2980 Duodenitis without bleeding: Secondary | ICD-10-CM

## 2010-05-11 DIAGNOSIS — R6889 Other general symptoms and signs: Secondary | ICD-10-CM

## 2010-05-11 DIAGNOSIS — D649 Anemia, unspecified: Secondary | ICD-10-CM

## 2010-05-11 DIAGNOSIS — R195 Other fecal abnormalities: Secondary | ICD-10-CM

## 2010-05-11 DIAGNOSIS — D500 Iron deficiency anemia secondary to blood loss (chronic): Secondary | ICD-10-CM

## 2010-05-11 DIAGNOSIS — D508 Other iron deficiency anemias: Secondary | ICD-10-CM

## 2010-05-11 DIAGNOSIS — K319 Disease of stomach and duodenum, unspecified: Secondary | ICD-10-CM

## 2010-05-11 MED ORDER — SODIUM CHLORIDE 0.9 % IV SOLN: 500.0000 mL | INTRAVENOUS | Status: DC

## 2010-05-11 NOTE — Patient Instructions (Signed)
Severe gastritis, duodenitis Await pathology Continue PPI See green and blue sheets for additional D/C instructions

## 2010-05-15 ENCOUNTER — Telehealth: Payer: Self-pay | Admitting: Internal Medicine

## 2010-05-15 ENCOUNTER — Telehealth: Payer: Self-pay

## 2010-05-15 ENCOUNTER — Encounter (HOSPITAL_COMMUNITY): Payer: Medicare Other | Attending: Internal Medicine

## 2010-05-15 DIAGNOSIS — D508 Other iron deficiency anemias: Secondary | ICD-10-CM | POA: Insufficient documentation

## 2010-05-15 NOTE — Telephone Encounter (Signed)
Forwarded to Dr. Cristal Generous for review.

## 2010-05-15 NOTE — Telephone Encounter (Signed)

## 2010-05-16 ENCOUNTER — Other Ambulatory Visit (INDEPENDENT_AMBULATORY_CARE_PROVIDER_SITE_OTHER): Payer: Medicare Other

## 2010-05-16 DIAGNOSIS — D500 Iron deficiency anemia secondary to blood loss (chronic): Secondary | ICD-10-CM

## 2010-05-16 LAB — CBC WITH DIFFERENTIAL/PLATELET
Basophils Absolute: 0 10*3/uL (ref 0.0–0.1)
Basophils Relative: 0.5 % (ref 0.0–3.0)
Eosinophils Absolute: 0 10*3/uL (ref 0.0–0.7)
Hemoglobin: 11 g/dL — ABNORMAL LOW (ref 12.0–15.0)
Lymphocytes Relative: 11.3 % — ABNORMAL LOW (ref 12.0–46.0)
Lymphs Abs: 0.6 10*3/uL — ABNORMAL LOW (ref 0.7–4.0)
MCHC: 34.2 g/dL (ref 30.0–36.0)
MCV: 92.7 fl (ref 78.0–100.0)
Monocytes Absolute: 0.4 10*3/uL (ref 0.1–1.0)
Neutro Abs: 4.1 10*3/uL (ref 1.4–7.7)
RDW: 17.3 % — ABNORMAL HIGH (ref 11.5–14.6)

## 2010-05-18 ENCOUNTER — Encounter: Payer: Self-pay | Admitting: Internal Medicine

## 2010-05-18 ENCOUNTER — Telehealth: Payer: Self-pay | Admitting: *Deleted

## 2010-05-18 DIAGNOSIS — D509 Iron deficiency anemia, unspecified: Secondary | ICD-10-CM

## 2010-05-18 NOTE — Telephone Encounter (Signed)
Spoke with patient and gave results and recommendations. Lab in [facility] for CBC in 3 months and note to me to call to remind patient when lab is due.

## 2010-05-18 NOTE — Telephone Encounter (Signed)
Message copied by Leone Payor on Thu May 18, 2010  9:45 AM ------      Message from: [name]      Created: Wed May 17, 2010 10:13 PM       Please call pt with improved CBC, from 9.7 up to

## 2010-05-19 ENCOUNTER — Encounter: Payer: Self-pay | Admitting: Internal Medicine

## 2010-05-22 ENCOUNTER — Telehealth: Payer: Self-pay | Admitting: Internal Medicine

## 2010-05-22 LAB — CBC
HCT: 25 % — ABNORMAL LOW (ref 36.0–46.0)
Hemoglobin: 10.7 g/dL — ABNORMAL LOW (ref 12.0–15.0)
Hemoglobin: 8 g/dL — ABNORMAL LOW (ref 12.0–15.0)
MCV: 83.9 fL (ref 78.0–100.0)
Platelets: 333 10*3/uL (ref 150–400)
Platelets: 340 10*3/uL (ref 150–400)
RBC: 2.98 MIL/uL — ABNORMAL LOW (ref 3.87–5.11)
RDW: 15.7 % — ABNORMAL HIGH (ref 11.5–15.5)
WBC: 4.8 10*3/uL (ref 4.0–10.5)
WBC: 5 10*3/uL (ref 4.0–10.5)

## 2010-05-22 LAB — COMPREHENSIVE METABOLIC PANEL
ALT: 17 U/L (ref 0–35)
Albumin: 3.3 g/dL — ABNORMAL LOW (ref 3.5–5.2)
Alkaline Phosphatase: 86 U/L (ref 39–117)
Potassium: 3.9 mEq/L (ref 3.5–5.1)
Sodium: 138 mEq/L (ref 135–145)
Total Protein: 6.2 g/dL (ref 6.0–8.3)

## 2010-05-22 LAB — BASIC METABOLIC PANEL
BUN: 15 mg/dL (ref 6–23)
Calcium: 9.5 mg/dL (ref 8.4–10.5)
Creatinine, Ser: 1.08 mg/dL (ref 0.4–1.2)
GFR calc non Af Amer: 49 mL/min — ABNORMAL LOW (ref >60)
Glucose, Bld: 111 mg/dL — ABNORMAL HIGH (ref 70–99)
Potassium: 4.2 mEq/L (ref 3.5–5.1)

## 2010-05-22 LAB — PROTIME-INR
INR: 2.2 — ABNORMAL HIGH (ref 0.00–1.49)
Prothrombin Time: 25.7 seconds — ABNORMAL HIGH (ref 11.6–15.2)

## 2010-05-22 LAB — IRON AND TIBC
Iron: 33 ug/dL — ABNORMAL LOW (ref 42–135)
TIBC: 380 ug/dL (ref 250–470)
UIBC: 347 ug/dL

## 2010-05-22 LAB — CROSSMATCH

## 2010-05-22 LAB — RETICULOCYTES
RBC.: 3.02 MIL/uL — ABNORMAL LOW (ref 3.87–5.11)
Retic Count, Absolute: 63.4 10*3/uL (ref 19.0–186.0)

## 2010-05-22 LAB — VITAMIN B12: Vitamin B-12: 714 pg/mL (ref 211–911)

## 2010-05-22 MED ORDER — DEXLANSOPRAZOLE 60 MG PO CPDR: 60.0000 mg | DELAYED_RELEASE_CAPSULE | Freq: Every day | ORAL | Status: DC

## 2010-05-22 NOTE — Telephone Encounter (Signed)
Spoke with patient and told her it is okay to go back the [place]. Rx sent to her pharmacy

## 2010-05-22 NOTE — Telephone Encounter (Signed)
OK to switch to Dexilant 60 mg po qd,.,#30, 3 refills.

## 2010-05-22 NOTE — Telephone Encounter (Signed)
Patient calling to report the Protonix is not helping. She has tried it BID. States " I am sick al over, not in pain, just sick all over."  She states her medication was changed when she was in the hospital to Protonix. States she took Dexilant in the past it helped. Is it okay to change her to Dexilant daily?

## 2010-06-20 NOTE — Op Note (Signed)
[name], Sophia [name] NO.:  [id number]   MEDICAL RECORD NO.:  [id number]          PATIENT TYPE:  AMB   LOCATION:  ENDO                         FACILITY:  [facility]   PHYSICIAN:  Waverly Ferrari, M.D.    DATE OF BIRTH:  [birth year]   DATE OF PROCEDURE:  DATE OF DISCHARGE:                               OPERATIVE REPORT   PROCEDURE:  Upper endoscopy.   INDICATIONS:  Gastroesophageal reflux disease, hemoccult positivity,  presumably.   ANESTHESIA:  Fentanyl 50 mcg, Versed 6 mg.   PROCEDURE IN DETAIL:  With the patient mildly sedated in the left  lateral decubitus position, the Pentax videoscopic endoscope was  inserted in the mouth, passed under direct vision into the esophagus,  where I saw a fair amount of esophageal spasm, but was able to advance  the endoscope of the plate was able to of that and advance the endoscope  to the GE junction, where a hiatal hernia was noted from above and  photographed.  The endoscope was advanced into stomach.  Fundus, body,  antrum were visualized and the antrum was noted to have diffuse erythema  which was photographed and biopsied.  Duodenal bulb, second portion  duodenum appeared normal.  From this point, the endoscope was slowly  withdrawn, taking circumferential views of duodenal mucosa until the  endoscope had been pulled back into stomach, placed in retroflexion to  view the stomach from below and it was noted that there may be Barrett's  esophagus on retroflexed views, so biopsies were taken of this area  after photographing it as well.  The endoscope was then straightened and  withdrawn, taking circumferential views of the remaining gastric and  esophageal mucosa.  Once again, the hiatal hernia and the esophageal  spasm was once again noted.  The endoscope was withdrawn.  The patient's  vital signs, pulse oximeter remained stable.  The patient tolerated  procedure well without apparent complications.   FINDINGS:  Esophageal  spasm with above hiatal hernia.  Question of  Barrett's esophagus, biopsied and antral erythema, biopsied.  Await  biopsy reports.  The patient will call me for results and followup with  me as an outpatient.           ______________________________  Waverly Ferrari, M.D.     GMO/MEDQ  D:  09/16/2007  T:  09/16/2007  Job:  [id number]   cc:   Barton Fanny, M.D.  Fax: [phone]

## 2010-06-20 NOTE — H&P (Signed)
NAMEMIRJANA, BRUNELL NO.:  [id number]   MEDICAL RECORD NO.:  [id number]          PATIENT TYPE:  INP   LOCATION:  5009                         FACILITY:  [facility]   PHYSICIAN:  Astrid Divine. Marcelino Scot, M.D. DATE OF BIRTH:  February 15, 1928   DATE OF ADMISSION:  12/29/2007  DATE OF DISCHARGE:                              HISTORY & PHYSICAL   CHIEF COMPLAINT:  Left knee pain/left tibial plateau fracture.   DERMATOLOGIST:  Dr. Estanislado Pandy.   HISTORY OF PRESENT ILLNESS:  This is a 77 year old Caucasian female with  a history of end-stage DJD in her right knee who has sustained a fall on  Saturday, December 27, 2007, in the morning hours on the way to the  store.  The patient was exiting her house when she thought she left her  coffee pot on, she turned around and went back into the house to turn it  off and did so successfully.  As she came out of the house, she slipped  on the top step of her porch and fell down approximately 4 steps, a  total height of probably 7-8 feet.  As a result, she landed on her left  hand, left knee, and also hit her head.  She had the immediate onset of  pain, inability to wear weight on the left leg.  Therefore, she was  brought to [facility] Emergency Department for evaluation of her  injuries.  Upon workup, it was demonstrated that she had a small  avulsion fracture of the volar plate at the middle phalanx at the PIP  joint, in addition to this and more concerning was the left lateral  tibial plateau fracture.  The patient was released from [facility]  Emergency Department with a knee immobilizer and was told to follow up  with orthopedist in about a week.  Since discharge from [facility] ED,  the patient has been experiencing significant amounts of pain and has  had tremendous difficulty mobilizing secondary to her advanced DJD in  her right knee.  The patient has become fairly reliant on family members  to help transport her to the bathroom and to  perform ADLs for her.  As  such, the patient's family contacted our office earlier this morning  stating that the patient has been complaining of a tremendous amount of  pain and is having great difficulty of mobilizing.  Therefore, we  thought prudent to proceed with direct admission for continued  observation and pain control and possible surgical fixation during this  hospital stay.   On clinical encounter, Ms. Dumouchel appears to be well.  She states the  circumstances around her fall which are noted above.  She denies any  loss of consciousness or blacking out prior to, during or after the  fall.  She was not lightheaded or dizzy which resulted in the fall.  She  simply states that she was rushing and tripped which caused her  accident.  She notes that immediately after the accident, she had  immediate onset of pain, was unable to bear weight.  Her pain was at  10/10, and  she needed to go to the emergency room which she did.  Currently, Ms. Mauzey appears to be comfortable.  Her pain is  approximately 6/10, is exacerbated with any type of movement.  She is  getting relief with Percocet that were provided to her upon discharge in  [facility] Emergency Department.  She has noted some swelling in her  left leg and has noted bruising of her foot as well.  She complains of  tenderness primarily along the lateral aspect of her left knee.  She  does not note any tenderness along the medial aspect of her left knee.  Aside from the injuries to her left hand and then contusion to her head,  she did not sustain any additional injuries in the accident.  Ms.  Thevenin notes that her pain is fairly steady and has not changed much  since the accident and pain is sharp and throbbing in nature, primarily  located about her left knee.  No radiation of pain into her upper leg,  into her back or down her leg into her foot.  She does not complain of  any shortness of breath.  No chest pain.  No  abdominal pain.  No nausea,  vomiting, or diarrhea.  No fevers.  No chills.  She has been well up  until this fall.  No recent illnesses.  No recent GI problems.   REVIEW OF SYSTEMS:  Positive for musculoskeletal pain, left knee.  There  is also some issues pertaining to her genitourinary system, sounds as if  there is some slight urinary retention.  She has had recent bowel  movement and says she is pretty regular.   PAST MEDICAL HISTORY:  Significant for:  1. Hypertension.  2. GERD status post upper endoscopy.  3. Osteoarthritis with severe DJD of her right knee and osteoarthritis      of her hands and right shoulder.  4. Hiatal hernia.  5. Neuropathy.  6. Hypercholesterolemia.  7. Depression.   SURGICAL HISTORY:  1. Cholecystectomy.  2. Tubal ligation.  3. Surgical repair for left plantar fasciitis.  4. ORIF, right wrist.  5. Four natural births.   SOCIAL HISTORY:  Ms. Labor has used tobacco products in the past, she  used to smoke approximately 3/4 of a pack a day, was started at the age  of 61 and quit back in 1995.  She denies any alcohol use.  No illicit  drug use.  She does live alone at home.  It is a single level house with  approximately 4 steps outside to enter the house.  She was walker-  dependent prior to admission and prior to this injury.  She also did  utilize a compression knee sleeve on her right knee for her  osteoarthritis.   MEDICATIONS:  1. Forteo 20 mg subcu daily.  2. Prevacid 30 mg p.o. daily.  3. Verapamil 240 mg p.o. daily.  4. Lipitor 40 mg p.o. daily.  5. Gabapentin 400 mg b.i.d.  6. Wellbutrin 150 mg p.o. daily.  7. Darvocet-N 100 p.o. p.r.n.  8. Alprazolam 0.5 mg p.o. t.i.d.  9. Vitamin D 50,000 units.  10.Calcium with vitamin D 3 times a day.  11.Multivitamins p.o. daily.  12.Lidoderm topical patch p.r.n.  13.Epinephrine for anaphylactic reaction to bee stings p.r.n. sting.   FAMILY HISTORY:  Noncontributory.   ALLERGIES:  1.  ACTONEL, the patient states anaphylaxis.  2. Anaphylaxis to BEE STINGS.   PHYSICAL EXAMINATION:  VITAL SIGNS:  Temperature temperature  99.0, heart  rate 86, respirations 20, at 96% on room air, BP 124/57.  GENERAL:  This is a well-developed, well-nourished Caucasian female who  does appear her stated age.  She is in no acute distress and converses  very well, is a good historian and is accompanied by her son.  HEENT:  Normocephalic and atraumatic except for bruise over the left  frontal region appears to be resolving.  Eyes, extraocular muscles are  intact.  Pupils are reactive to accommodation.  Moist mucous membranes  are noted.  NECK:  Supple.  No lymphadenopathy and no bruits.  Full range of motion  and no spinous tenderness is appreciated.  LUNGS:  Clear to auscultation bilaterally.  CARDIAC:  Regular rate and rhythm.  ABDOMEN:  Soft, nontender, nondistended with positive bowel sounds.  PELVIS:  No instability is appreciated with compression of ASIS or iliac  crest bilaterally.  EXTREMITIES:  Bilateral upper extremities did not demonstrate any gross  deformities, no crepitus, no blocked motion with passive range of  motion, the only exception is that there is a splint around the digits 3  and 4 on the left hand splinting the injuries sustained in the fall,  otherwise, no acute findings are noted.  The patient demonstrates full  range of motion at her wrist, elbows, and shoulders and hands  bilaterally which demonstrates good strength, strength on the left side  is restricted somewhat to the splint that is in place.  Sensation along  the radial, ulnar, and median nerves is intact to light touch.  Axillary  nerve sensation is also intact.  Distal motor function along the radial,  ulnar, and median, anterior interosseus, and posterior interosseus  nerves is intact.  There is good skin color.  No skin tensing is noted.  There is a palpable radial pulse bilaterally strong and good.   Brisk  capillary refill is noted.  Right lower extremity, no gross deformities,  crepitus, or blocked motion is noted.  The patient is nontender to  palpation on the soft tissue and compartments.  The patient is able to  demonstrate good active range of motion at the ankle, knee, and hip.  She does not demonstrate any laxity with testing of the knee and ankle.  No tenderness to palpation on the soft tissue or the bony landmarks.  No  swelling is appreciated on right lower extremity.  She has a 2+ dorsalis  pedis pulse.  Sensation along the deep peroneal nerves, superficial  peroneal nerve, and tibial nerve is intact to light touch.  EHL, FHL,  anterior tibialis, posterior tibialis, peroneal, and gastroc soleus  complex demonstrates good motor function.  The patient demonstrates a  good active quad set.  This lower extremity demonstrates good color.  Again, pulses are palpable both the DP and dorsalis pedis.  Brisk  capillary refill.  Skin is warm.  Examination of the left lower  extremity does not demonstrate any gross deformity or malalignment of  the left leg.  There is some swelling appreciated along the lateral  aspect of the left tibia.  A small knee effusion is also appreciated.  Examination of the ankle and the hip are negative for any acute  findings.  The patient did not have any tenderness with palpation of the  hip or the ankle.  The patient is able to range her ankle within a full  range.  Hip and knee range of motion not assessed secondary to pain.  With palpation, the patient does  demonstrate some tenderness along the  lateral aspect of the left tibia along the left lateral tibial plateau.  No tenderness is noted with palpation of the patella.  No tenderness  over the tibial tuberosity.  No tenderness is appreciated with palpation  over the medial aspect of the left knee.  Again, no tenderness with  palpation of the bony landmarks of the hip or the ankle.  Sensory  functions  along the DPN, SPN, TN are intact to light touch.  Extensor  hallucis longus, flexor hallucis longus, anterior tibialis, posterior  tibialis, peroneal, and gastroc-soleus complex are intact with respect  to gross motor testing.  The patient does demonstrate a good active quad  set on the left side, but it is restricted secondary to pain.  There is  2+ dorsalis pedis pulse and 1+ posterior tibialis pulse.  The extremity  has good color and feels warm.  There is some ecchymosis noted on the  dorsal aspect of the left foot as well.  Ecchymosis is also appreciated  along the proximal aspect of the left tibia.  There is some swelling  over the lateral aspect of the left proximal tibia; however, the skin  does wrinkle nicely with gentle compression.   X-rays multiple views of the left knee obtained on December 27, 2007,  demonstrate what appears to be a depressed left lateral tibial plateau  fracture with possibly split fracture also involving the left lateral  tibial plateau.  Also, a fracture involving the left fibular head.   Labs are pending.   ASSESSMENT AND PLAN:  This is 77 year old Caucasian female who sustained  a ground level fall resulting in a left lateral tibial plateau fracture  SHATZKER 2 versus sHATZKER 3, OTA classification 41-B2.1 versus 41-B3.1.  1. We will admit Ms. Nieto to our service for continued observation      and pain control.  Given her physical exam, I feel that we will be      able to perform surgery for definitive plate osteosynthesis of her      tibial plateau fracture tomorrow.  Her skin and soft tissue appears      to be in a good condition to permit safe surgical intervention.  2. We will continue workup of her left tibial plateau fracture by      obtaining a CT scan to further evaluate the amount of depression      involved and in fact there is a split component to her fracture as      well.  This skin is a valuable tool in helping our intraoperative       plan and for correct management of her fracture.  3. After surgery, we will initiate deep vein thrombosis prophylaxis      with Lovenox and most likely bridge her to Coumadin.  We would      anticipate Coumadin for 6 weeks after surgery, but this will be      based on her mobility.  4. Ms. Mcroberts will be on bed rest until surgery is performed.  After      surgery, she will be nonweightbearing on her left lower extremity      for the next 6-8 weeks.  However, we will initiate early gentle      range of motion activity of her left knee to prevent the      development of any types of knee contractures.  She will be fitted      for a [employer]  knee brace on her left leg after surgery to      facilitate this.  We will also consult PT and OT and have care      management involved.  5. I would anticipate that Ms. Lagow will need a skilled nursing      facility after surgery to help her mobilize.  6. I am concerned over the limited function of her right knee prior to      this accident and the impact it will have on her recovery from this      point forward.  We may consider having Ms. Cousar fitted for an      offloader hinged knee brace to provide some additional support,      given the advanced osteoarthritis in her right knee.  We will      obtain an x-ray of her right knee to evaluate the severity of the      disease and to direct [place] as to what type of knee brace should be      provided for the patient.  Ms. Schopp will be n.p.o. after      midnight.  We will give Ancef 1 g IV on call to the OR.  Also      obtain routine labs which will include a CBC, a BMET, UA, and urine      culture over concerns of the urinary retention which may be      attributed to urinary tract infection.   The patient was seen and examined with Dr. Marcelino Scot.      Jari Pigg, PA      Astrid Divine. Marcelino Scot, M.D.  Electronically Signed    KWP/MEDQ  D:  12/29/2007  T:  12/30/2007  Job:  [id number]

## 2010-06-20 NOTE — Op Note (Signed)
NAMETRASHA, RUBERT NO.:  [id number]   MEDICAL RECORD NO.:  [id number]          PATIENT TYPE:  AMB   LOCATION:  ENDO                         FACILITY:  [facility]   PHYSICIAN:  Waverly Ferrari, M.D.    DATE OF BIRTH:  Jun 23, 1928   DATE OF PROCEDURE:  09/16/2007  DATE OF DISCHARGE:                               OPERATIVE REPORT   PROCEDURE:  Colonoscopy.   INDICATIONS:  Anemia, rule out GI bleed.   ANESTHESIA:  Fentanyl 50 mcg, Versed 4 mg.   PROCEDURE:  With the patient mildly sedated in the left lateral  decubitus position, Pentax videoscopic colonoscope was inserted in the  rectum and passed under direct vision with pressure applied to the  cecum, identified by ileocecal valve and appendiceal orifice, both of  which were photographed.  From this point, the colonoscope was slowly  withdrawn taking circumferential views of colonic mucosa, stopping to  suction greenish fecal debris as we withdrew, stopping only in the  rectum which appeared normal on direct and showed hemorrhoids on  retroflexed view.  The endoscope was straightened and withdrawn.  The  patient's vital signs and pulse oximeter remained stable.  The patient  tolerated procedure well without apparent complications.   FINDINGS:  Some thickening and diverticula noted in the sigmoid colon.  Internal hemorrhoids.  Otherwise an unremarkable exam.  The hemorrhoids  were fairly marked.   PLAN:  See endoscopy note for further details.           ______________________________  Waverly Ferrari, M.D.     GMO/MEDQ  D:  09/16/2007  T:  09/16/2007  Job:  [id number]   cc:   Barton Fanny, M.D.  Fax: [phone]

## 2010-06-20 NOTE — Discharge Summary (Signed)
NAMESHINEQUA, BAYONA NO.:  [id number]   MEDICAL RECORD NO.:  [id number]          PATIENT TYPE:  OBV   LOCATION:  [id number]                         FACILITY:  [facility]   PHYSICIAN:  Annita Brod, M.D.DATE OF BIRTH:  12-25-1928   DATE OF ADMISSION:  02/06/2008  DATE OF DISCHARGE:  02/09/2008                               DISCHARGE SUMMARY   PRIMARY CARE PHYSICIAN:  Dr. Lajean Manes.   DISCHARGE DIAGNOSES:  1. Anemia secondary to recent surgery.  2. Status post transfusion 2 units packed red blood cells.  3. Altered mental status felt to be secondary to dehydration and      anemia, now resolved.  4. Muscle weakness and deconditioning.   DISCHARGE MEDICATIONS:  Are as follows:  1. Vitamin D 50,000 units weekly.  2. Wellbutrin 150 p.o. daily.  3. Forteo 20 mcg subcu daily.  4. Simvastatin 80 p.o. q.h.s.  5. Multivitamin daily.  6. Verapamil 40 p.o. daily.  7. Coumadin 3 mg alternating with 4 mg every other day.  8. Neurontin  400 p.o. b.i.d.  9. Prevacid 30 p.o. b.i.d.  10.Xanax 0.25 p.o. t.i.d.  11.Os-Cal D t.i.d.  12.Oxybutynin 10 p.o. q.h.s.  13.EpiPen p.r.n.  14.Methocarbamol 500 p.o. q.6 hours p.r.n. for spasm.  15.Percocet 10/650 1/2 tablet p.o. q.4 hours p.r.n. for pain.  16.Lidoderm patch 5% on for 12 hours at night, off for 12 hours during      the day.   HOSPITAL COURSE:  The patient is a 77 year old white female with recent  knee surgery who was discharged to the [facility] for rehab when she  looked to be having episodes of confusion and weakness.  When her PCP  saw her and ordered routine labs, it was noted that her hemoglobin was  down to 7.8 and at the time of discharge it was 9.6.  The patient was  brought in as a direct admission.  She was typed and crossed and  transfused 2 units of packed red blood cells.  Her hemoglobin was noted  to be on arrival to the 5100 unit at 8.0.  On 2 units of packed red  blood cells, her hemoglobin  improved to 10.7.  She remained stable and  her pressures remained stable as well.  She was continued on her  Coumadin and she has otherwise been improved.  She had some mild  confusion which was felt to be possibly secondary to her anemia and  slightly decreased oxygenation because of her anemia.  This improved  following her blood transfusion and currently she is fully alert and  oriented.  She has been evaluated by PT who recommended the patient go  back to the [facility] for rehab.  This patient will be discharged  back to [facility] skilled nursing on Monday, February 09, 2008.  The  patient's overall disposition is improved.   ACTIVITY:  Will be as per the [facility].   DISCHARGE DIET:  Will be a low-sodium diet.   In regards to her Coumadin, initially it had been scheduled for the  patient to stop her Coumadin on February 10, 2008; however, because of the  patient's slight delay in her recovery because of the blood transfusion,  I would recommend that she stay on Coumadin for 5 more days and stop her  Coumadin on February 15, 2008.   DISCHARGE FOLLOWUP:  The patient follow up with primary care physician,  Dr. Lajean Manes in 2 weeks and her disposition is improved.      Annita Brod, M.D.  Electronically Signed     SKK/MEDQ  D:  02/08/2008  T:  02/08/2008  Job:  [id number]   cc:   Hal T. Stoneking, M.D.

## 2010-06-20 NOTE — Discharge Summary (Signed)
Sophia [name], Sophia [name] NO.:  [id number]   MEDICAL RECORD NO.:  [id number]          PATIENT TYPE:  INP   LOCATION:  5009                         FACILITY:  [facility]   PHYSICIAN:  Astrid Divine. Marcelino Scot, M.D. DATE OF BIRTH:  1928/05/03   DATE OF ADMISSION:  12/29/2007  DATE OF DISCHARGE:  01/05/2008                               DISCHARGE SUMMARY   DISCHARGE DIAGNOSES:  1. Left lateral tibial plateau fracture.  2. Tibial eminence fracture left knee.  3. Right knee osteoarthritis lateral compartment.   ADDITIONAL DIAGNOSES:  1. Hypertension.  2. Gastroesophageal reflux disease.  3. Hiatal hernia.  4. Neuropathy.  5. Hypercholesterolemia.  6. Depression.   PROCEDURE PERFORMED:  On December 30, 2007, ORIF left lateral tibial  plateau, enclosed treatment of tibial eminence fracture using Synthes  locking  proximal tibial plate.   BRIEF HISTORY AND HOSPITAL COURSE:  Ms. Sophia [name] is a very pleasant 77-  year-old Caucasian female who sustained a left tibial plateau fracture  on December 27, 2007, after she sustained a ground-level fall.  Patient  was brought to the emergency room which demonstrated the tibial plateau  fracture, however, she was discharged to home in a knee immobilizer and  was given Percocet for pain control.  Subsequently over the next 2 days,  patient began to have increasing pain and contacted our office.  The  patient was directly admitted to [facility] from our office for  continued observation and pain control with anticipated open reduction  with internal fixation the following day.  After admission, the patient  was prepped for surgery and was made n.p.o. and underwent the procedure  described up above on December 30, 2007, and tolerated the procedure  well.  After a brief stay in the PACU, patient was transported back to  the orthopedic floor for continued observation and pain control.  Immediately postop, patient was started on Lovenox  for DVT prophylaxis  40 mg subcutaneously daily.  Patient's hospital course was relatively  uncomplicated.  However, immediately postoperatively we had some issues  with pain control.  Patient was initially on morphine but was not  receiving any relief from this medication whatsoever.  As such we  switched her Dilaudid 0.5 mg IV every 2 hours which appeared to  alleviate her pain.  Postoperative day #1, patient was doing much  better.  As it appeared, the Dilaudid was helping.  In addition, her  dressing was loosened which helped lessen her pain as well.  Postoperative day #1, her labs did demonstrate a significant drop in her  sodium to a level of 129 as well as her H and H to 8.4 and 25.1 from a  preoperative level of 8.8 and 27.2.  Ms. Sophia [name] also did complain of  some weakness and fatigue which suggested that she was symptomatic  secondary to her acute blood loss anemia.  As such we decided to  transfuse her with 1 unit of packed red blood cells which she tolerated  very well.  Post transfusion specimen demonstrated an H and H of 9.3 and  27.6 the following day.  In addition, the patient was somewhat  hyponatremic and we therefore discontinued her IV fluids and initiated a  fluid restriction of 1000 mL per day.  However, I did have a suspicion  that this may have been a spurious result and given the drastic drop  from her admission sodium which was 137.  Therefore, I ordered another  BMET which did demonstrate a sodium of 135.  As such, her fluid  restriction was discontinued.  Postoperative day #2, patient continued  to do very well.  The patient and her family decided that she would not  go to the [facility].  The drain was pulled and the wound was re-  dressed.  There were no acute findings and the drain was pulled without  complication.  For the remainder of visit, Ms. Cohagan visit was  uncomplicated.  We did order a Bledsoe hinge knee brace for the left  knee as well as  an osteoarthritis brace for her right knee.  We were  very concerned with her ability to begin early mobilization on her right  leg as this has severely advanced DJD.  Ms. Sophia [name] and her family made  it known that prior to the accident she was highly dependent on using  her left leg to compensate for her right leg.  Therefore, we felt that  it was necessary to obtain a custom made osteoarthritis brace for the  right knee to allow for her safe mobilization.  Patient did ultimately  receive both braces and was working with physical therapy fairly well.  Ms. Sophia [name] was still requiring significant assist to help with sit-to-  stand and stand-to-sit as well as transfers to chair as well given her  limited capacity to bare weight on her well right side.  On  postoperative day #6, Ms. Sophia [name] was deemed stable for discharge to  SNF.  She does not have any complaints and is ready to be discharged.  Clinical encounter notes for postoperative day #6 January 05, 2008, is  as follows.   SUBJECTIVE/OBJECTIVE:  Patient is doing very well without any  complaints.  Pain is well-controlled on p.o. medications.  Patient has  received her osteoarthritis brace for her right leg and her Bledsoe  hinge knee brace to the left leg.  She denies any numbness or tingling.  She is tolerating p.o. well.  She has not had any difficulty voiding and  has had a bowel movement.  She is ready for discharge to the SNF.   VITAL SIGNS:  Temperature 98.0.  Heart rate 76.  Respirations 20 at 96%  on room air.  Blood pressure 128/77.  No new labs are noted.  GENERAL:  Patient is awake, alert, no acute distress, comfortable.  LUNGS:  Clear to auscultation bilaterally.  HEART:  Regular rate and rhythm but distant.  ABDOMEN:  Soft, nontender.  Positive bowel sounds.  LOWER EXTREMITIES:  Incision was clean, dry and intact.  Looks  excellent.  No signs of infection and no drainage.  No deep calf  tenderness is noted.   Compartments are soft and nontender.  Deep  peroneal nerve, superficial peroneal nerve and tibial nerve sensation is  intact to light touch.  EHL, FHL, anterior tibialis, posterior tibialis,  peroneales and gastroc motor is intact as well.  Extremities warm.  There is good color with brisk capillary refill.  There is positive  dorsalis pedis pulse as well.   ASSESSMENT AND PLAN:  This is a 77 year old Caucasian female status  post  open reduction with internal fixation left tibial plateau fracture who  continues to improve.  1. Non-weightbearing left lower extremity.  2. Okay for left knee range of motion with a Bledsoe.  No range of      motion should be done without the brace on.  3. Lovenox for deep vein thrombosis prophylaxis and we will start with      Coumadin as well.  Do anticipate that she will have severely      restricted mobility for a period of time and feel that a prolonged      treatment with Coumadin is necessary and warranted.  4. Patient should wear her osteoarthritis brace on the right leg while      ambulating and doing therapy.  5. Daily wound care to her left leg.  The wound may be cleaned with      soap and water only.  No ointments or lotions are to be applied to      the wound.  6. We would discharge the patient to the skilled nursing facility      today.  7. The patient is to follow up at the office in 7-10 days.   DISCHARGE MEDICATIONS:  Are as follows:  1. Norco 5/325 one to two p.o. every 4-6 hours as needed for pain.  2. Robaxin 500 mg 1-2 p.o. every 6 hours as needed for spasms.  3. Lovenox 40 mg 1 subcutaneously daily.  4. Coumadin as directed by pharmacy protocol.   Patient may resume her home medications as follows:  1. Forteo 20 mg subcutaneously daily.  2. Prevacid 30 mg 1 p.o. b.i.d.  3. Verapamil 240 mg p.o. daily.  4. Lipitor 40 mg p.o. daily.  5. Gabapentin 400 mg p.o. b.i.d.  6. Wellbutrin 150 mg p.o. daily.  7. Alprazolam 0.5 mg p.o.  t.i.d.  8. Vitamin D 50,000 units weekly.  9. Multivitamin p.o. daily.  10.Citracal plus D 600 p.o. t.i.d.  11.Lidoderm patch as needed.  12.EpiPen if needed for anaphylactoid reaction.   DISCHARGE INSTRUCTIONS:  Ms. Sharber did sustain a pretty significant  injury to her left leg.  She will be non-weightbearing on her left lower  extremity for at least the next 6-8 weeks as her tibial plateau fracture  heals.  We are greatly concerned given the fact that her condition is  compounded by the severe osteoarthritis in her right knee which makes  mobilization that much more difficult.  We are hopeful that with the  addition of the osteoarthritis brace to her right knee, she will be able  to mobilize more efficiently and safely during rehabilitation.  Ms.  Lagnese needs to wear her Bledsoe hinge knee brace on her left knee at  all times during this acute phase.  The brace should not be locked out  and should permit full range of motion about her left knee.  It is okay  at this point to initiate gentle passive and active range of motion of  her left knee and flexion and extension.  With primary focus at this  point being on achieving a full extension and gradually working on knee  flexion, optimal goal is at 3-4 weeks.  She should have at least 90  degrees of knee flexion.  Ms. Auger will require daily physical  therapy at the skilled nursing facility for close supervision to help  her mobilize safely.  Ms. Midgette will be on long term DVT prophylaxis  given her expected restricted  mobility.  This will continue to be with  Lovenox until her INR becomes therapeutic on Coumadin.  Again, she will  be non-weightbearing for the next 6-8 weeks with gradual weightbearing  thereafter.  We will anticipate that once she begins some partial  weightbearing, she will continue to wear her Bledsoe knee brace on the  left side.   GENERAL WOUND CARE INSTRUCTIONS:  Are as follows:  It is permitted to  cleanse her operative wound with soap and water only.  No lotions, ointments or solutions should be applied at any point in  time to the operative wound as these may be more detrimental to wound  healing.  Nursing staff should be watchful for any signs of infection  which include increased erythema, increased pain, any new drainage or  foul odor.  Should these be noted, the patient is to contact the office  immediately and we will get her in for evaluation.  Ms. Pluff will  follow up with our office in the next 7-10 days for re-evaluation.  At  that time, we will obtain x-rays of her left knee to evaluate for  hardware placement as well as fracture healing.  We will also obtain  range of motion assessment to check her progress thus far and remove her  staples if the surgical wounds permit.  Should the patient or the  nursing facility have any questions at any point in time, they are to  feel free to contact the office at [phone].     Jari Pigg, PA      Astrid Divine. Marcelino Scot, M.D.  Electronically Signed   KWP/MEDQ  D:  01/05/2008  T:  01/05/2008  Job:  [id number]

## 2010-06-20 NOTE — Op Note (Signed)
NAMEJODILYN, Sophia [name] NO.:  [id number]   MEDICAL RECORD NO.:  [id number]          PATIENT TYPE:  INP   LOCATION:  5009                         FACILITY:  [facility]   PHYSICIAN:  Astrid Divine. Marcelino Scot, M.D. DATE OF BIRTH:  1928-03-05   DATE OF PROCEDURE:  12/30/2007  DATE OF DISCHARGE:                               OPERATIVE REPORT   PREOPERATIVE DIAGNOSES:  1. Left unicondylar tibial plateau fracture.  2. Tibial eminence fracture.   POSTOPERATIVE DIAGNOSES:  1. Left unicondylar tibial plateau fracture.  2. Tibial eminence fracture.   PROCEDURES:  1. Open reduction and internal fixation of left tibial plateau.  2. Close treatment of tibial eminence fracture.   SURGEON:  Astrid Divine. Marcelino Scot, MD   ASSISTANT:  Jari Pigg, PA   ANESTHESIA:  General.   FINDINGS:  Osteoporosis.   COMPLICATIONS:  None.   DRAINS:  one.   ESTIMATED BLOOD LOSS:  Minimal.   TOTAL TOURNIQUET TIME:  1 hour and 40 minutes.   DISPOSITION:  PACU.   CONDITION:  Stable.   BRIEF SUMMARY AND INDICATIONS FOR PROCEDURE:  Sophia [name] is a 77-  year-old female who sustained a tibial plateau fracture several days ago  and was discharged from the ED without having been seen in the hospital  by Orthopedics.  She subsequently was admitted directly to me after  having difficulty at home.  When we learned the nature for her injury  and comorbidities including contralateral knee pain, I discussed with  her preoperatively the risks and benefits of surgery including the  possibility of infection, nerve injury, vessel injury, failure to  restore her anatomy, and need for further surgery, and multiple others.  We also discussed alternative treatments including waiting for the  fracture to heal and performing a total knee arthroplasty.  After full  discussion of the risks and benefits, she wished to proceed with ORIF.   BRIEF DESCRIPTION OF PROCEDURE:  Sophia [name] was taken to the operating  room.   After administration of preoperative antibiotics, her left lower  extremity was prepped and draped in the usual sterile fashion.  A  standard anterolateral approach was made to the knee and dissection  carried down to the proximal femur through the anterior compartment as  well as proximal to the joint.  There was extensive depression of the  articular surface, which could be palpated over the retinaculum.  We did  encounter cancellus bleeding, which was not controlled with the Bovie,  and because of the need to work further in the metaphysis, I chose to go  ahead and use the Esmarch bandage to exsanguinate the extremity and then  elevate the tourniquet to 311 mmHg.  I then performed an arthrotomy and  incised the coronary ligament along the base of the plateau.  It was  quite thick in this area and cutting through the meniscus, which was  rock hard and was visualized.  We did not see any large fragments in the  joint nor there was any tear other than what was identified on entry  into the joint of the meniscus itself.  This was repaired back with 0  Prolene suture in vertical mattress fashion.  Attention was then turned  to the proximal tibia.   A cortical window was made in the bone using the half-inch osteotome  trapdoor with a hinge posterior, this was flapped open, and through it,  a series of broad tamps were placed and the lateral compartment elevated  from its severely depressed position.  I then took the lateral plate and  multiple K-wires into the lateral tibial plateau segment and then while  Sophia [name] applied varus force, elevated the plate in the lateral  plateau and then drove K-wires across into the medial side securing  significant improvement in her overall alignment and elevation of the  lateral side.  After this was achieved, we then placed a single lag  screw from the lateral into the medial side, then applied the [employer]  clamp and further compressed it.  Screws  were placed down in the  metaphysis and shaft using a combination of standard fixation which was  placed first followed by locked fixation.  This resulted in a stable  construct.  The large metaphyseal void and the underneath the lateral  plateau was filled with Norian cement, a full 5 mL.  A drain was placed  into the anterior compartment.  Final AP and lateral images showed  appropriate alignment, hardware placement, and length.  Standard layered  closure was performed with 0 Vicryl, 2-0 Vicryl, and staples.  Sterile  gently compressive dressing was applied.  Tourniquet was deflated.  The  patient was placed in knee immobilizer and then taken to the PACU in  stable condition.  Sophia Spinner, PA, assisted throughout the procedure  with reduction, retraction during exposure, placement of provisional  fixation, and simultaneous wound closure to perform this case  appropriately.   PROGNOSIS:  Sophia [name] has severe osteopenia and a severe injury to  the left tibial plateau.  I am concerned about her going on to arthritis  as we were unable to completely restore alignment of her plateau, but it  is very close and much greater than her preoperative position.  I am  hopeful this will translate into a positive outcome for her.  She have  unrestricted range of motion of the knees as soon as her wound allows.  She will need to be on long-term DVT prophylaxis and will be  weightbearing for the next 6-8 weeks with gradual weightbearing  thereafter.      Astrid Divine. Marcelino Scot, M.D.  Electronically Signed     MHH/MEDQ  D:  12/30/2007  T:  12/31/2007  Job:  [id number]

## 2010-06-20 NOTE — H&P (Signed)
NAMEKANYLA, ZEIEN NO.:  [id number]   MEDICAL RECORD NO.:  [id number]          PATIENT TYPE:  OBV   LOCATION:  [id number]                         FACILITY:  [facility]   PHYSICIAN:  Farris Has, MDDATE OF BIRTH:  05-07-1928   DATE OF ADMISSION:  02/06/2008  DATE OF DISCHARGE:                              HISTORY & PHYSICAL   PRIMARY CARE Christiaan Strebeck:  [facility].   The patient lives currently in the [facility].   Request for admission by Dr. Felipa Eth.   CHIEF COMPLAINT:  Abnormal lab values.   HISTORY OF PRESENT ILLNESS:  The patient is a 77 year old lady who  recently has had a fall in November of 2009 that ended up resulting in  depressed left lateral vertebral plateau fracture and left fibular head  fracture.  The patient was admitted.  She had an open reduction and  internal fixation and was discharged to the [facility] with follow-up  and rehab.  The patient was doing relatively well.  With routine labs  Dr. Felipa Eth noticed that her hemoglobin was down to 7.8 yesterday.  At  the time of discharge it was 9.6.  The patient was discharged on  Coumadin, but she does not endorse any melena  or bright red blood per  rectum.  She was minimally symptomatic.  And Dr. Felipa Eth felt that she  would benefit from a transfusion, for which she was admitted.  The  patient otherwise denies any shortness of breath, no chest pain.  Feeling well. No fevers or chills.  The patient has been somewhat  constipated but otherwise review of systems is unremarkable.   PAST MEDICAL HISTORY:  Significant for:  1. Recent lateral tibial fracture.  2. Hypertension.  3. GERD.  4. Hiatal hernia.  5. Neuropathy.  6. Hypercholesterolemia.  7. Depression.   SOCIAL HISTORY:  Significant for the patient is currently living in  [facility].  She previously smoked but quit in 1995.  She does not use  alcohol.   ALLERGIES:  To ACTONEL.  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   MEDICATIONS:  Per the patient's chart:  1. Vitamin D once a week.  2. Albuterol 150 mg daily.  3. __________ 20 mg daily.  4. Simvastatin 80 mg daily.  5. Multivitamins daily.  6. Verapamil 240 mg daily.  7. Coumadin 4 mg every other day.  8. Neurontin 400 mg  p.o. twice daily.  9. Prilosec 20 mg  twice daily.  10.Lidoderm topical patch as needed.  11.Epinephrine pen as needed.  12.Alprazolam 0.25 3 times per day as needed.  13.Citrus calcium with vitamin D.  14.Oxybutynin 10 mg p.o. at bedtime.  15.Prevacid 20 mg p.o. b.i.d.  16.Methocarbamol 500 mg q.6h. as needed for spasm.  17.Hydrocodone/APAP as needed for pain.  18.Lidoderm patch.   VITAL SIGNS:  Temperature 98.4, blood pressure 136/71, pulse 72,  respirations 20, satting initially 92%  on room air.   PHYSICAL EXAMINATION:  GENERAL APPEARANCE:  The patient appears to be in  no acute distress.  HEENT: Head nontraumatic.  Moist mucous membranes.  LUNGS:  Clear to auscultation anteriorly.  The patient is unable totake  deep breaths  HEART: There is a slight systolic murmur noted.  Regular rate and  rhythm.  ABDOMEN: Soft and nontender but obese and nondistended.  EXTREMITIES:  No clubbing, cyanosis, or edema.  Wearing compression  stockings.   LABORATORY RESULTS:  White blood cell count 4.8, hemoglobin 8. Sodium  136, potassium 4.2, creatinine 1.  Chest x-ray showed no acute process.  Of note, Dr. Felipa Eth obtained Hemoccult of her stool which was  negative.   ASSESSMENT AND PLAN:  This is a 77 year old female   ASSESSMENT AND PLAN:  This is a 77 year old female with anemia most  likely secondary to postoperative acute blood loss but will obtain an  anemia panel. Will transfuse 2 units of PRBC and follow-up CBC.  Will  Hemoccult stool. Expect discharge to home in the a.m. if CBC improves.  Will continue her home medications for high blood pressure, depression,  and hyperlipidemia.       Farris Has, MD  Electronically Signed     AVD/MEDQ  D:  02/06/2008  T:  02/06/2008  Job:  [id number]   cc:   Hal T. Stoneking, M.D.

## 2010-06-23 NOTE — Op Note (Signed)
Sophia [name], FOULKES NO.:  [id number]   MEDICAL RECORD NO.:  [id number]          PATIENT TYPE:  OIB   LOCATION:  [id number]                         FACILITY:  [facility]   PHYSICIAN:  Chrystie Nose. Zigmund Daniel, M.D. DATE OF BIRTH:  1928/06/26   DATE OF PROCEDURE:  06/29/2004  DATE OF DISCHARGE:                                 OPERATIVE REPORT   ADMISSION DIAGNOSES:  1.  Dislocated intraocular lens into the vitreous, right eye.  2.  Vitreous opacities, right eye.  3.  Aphakia, right eye.   PROCEDURES:  Pars plana vitrectomy, removal of nonmagnetic foreign body from  vitreous, retinal photocoagulation. gas injection. secondary intraocular  lens placement with suture and membrane peel in the right eye.   SURGEON:  Tempie Hoist, M.D.   ASSISTANT:  Thurman Coyer. Lundquist, P.A.   ANESTHESIA:  General.   DETAILS:  Usual prep and drape, peritomies from 8 o'clock around to 4  o'clock at the limbus.  Scleral flaps were raised at 3 and 9 o'clock in  anticipation of IOL suture.  The 25-gauge trocars were placed at 8, 10 and 2  o'clock.  The Provisc was placed on the corneal surface and biome viewing  system was moved into place.  The pars plana vitrectomy was begun and  vitreous was carefully removed from its attachments to the intraocular lens  and the iris.  A three-layered corneal wound was opened up from 11 o'clock  to 2 o'clock.  The intraocular lens was passed through the pupil and into  the anterior chamber, then out through the limbal wound.  The indirect  ophthalmoscope was moved into place; 842 burns were placed around the  retinal periphery with a power 500 milliwatts, 1000 microns each and 0.1  seconds each.  A new intraocular lens, model CZ70BD from [employer], 21.0 diopters in power, length 12.5 mm, optics 7.0 mm, serial  numbers [id number], was brought onto the field.  The lens was inspected and  cleaned.  Two 10-0 Prolene sutures were placed beneath  the scleral flaps at  3 and 9 o'clock for one ciliary sulcus to the other.  The sutures were  externalized through the pupil and the limbal wound.  Sutures were attached  to the intraocular lens.  The intraocular lens was passed through the limbal  wound into the posterior chamber and positioned behind the iris.  The lens  was dialed into place.  The sutures were drawn securely, knotted and the  free ends removed.  The additional pars plana vitrectomy was carried out at  this point, removing pigment particles and some blood from the vitreous  cavity.  The corneal wound was closed with 4 interrupted 10-0 sutures.  The  wound was tested and found to be tight.  1.0 mL of intravitreal gas was  injected to reinflate the globe.  The conjunctiva was closed with 7-0  chromic suture.  Polymyxin and gentamicin were irrigated into Tenon's space,  Decadron 10 mg was injected to the lower subconjunctival space, Marcaine was  injected around the globe for postop  pain, TobraDex ophthalmic ointment was  placed in the cul-de-sac, above and  below.  The closing pressure was 10 with a Barraquer keratometer.  Complications -- none.  Duration -- 2 hours.  A patch and shield were  placed.  The patient was awakened and taken to recovery in satisfactory  condition.      JDM/MEDQ  D:  06/29/2004  T:  06/30/2004  Job:  [id number]

## 2010-07-26 ENCOUNTER — Telehealth: Payer: Self-pay | Admitting: Internal Medicine

## 2010-07-26 NOTE — Telephone Encounter (Signed)
Patient notified that labs are due on 08/17/10.

## 2010-07-27 ENCOUNTER — Telehealth: Payer: Self-pay | Admitting: Internal Medicine

## 2010-07-27 NOTE — Telephone Encounter (Signed)
Scheduled patient on 07/28/10 at 2:00 PM with Tye Savoy, NP

## 2010-07-27 NOTE — Telephone Encounter (Signed)
Work in with an Conservation officer, historic buildings, next available

## 2010-07-27 NOTE — Telephone Encounter (Signed)
Spoke with patient and she states she is having stomach pain all over her abdomen above belly button after eating breakfast. She is taking Dexilant and uses a heating pad to abdomen. Heat seems to help. She has noticed weakness in her arms for sometime now and she is worried her Hgb is low. Denies bleeding or SOB. (last HGB on 05/16/10- 11.0) Hx hemorrhagic gastritis, iron def. Anemia, Barrettt's esophagus. Last EGD- 05/11/10- severe gastritis, dudenditis, prebyesophagus,"watermelon stomach." Please, advise

## 2010-07-28 ENCOUNTER — Encounter: Payer: Self-pay | Admitting: Nurse Practitioner

## 2010-07-28 ENCOUNTER — Ambulatory Visit (INDEPENDENT_AMBULATORY_CARE_PROVIDER_SITE_OTHER): Payer: Medicare Other | Admitting: Nurse Practitioner

## 2010-07-28 ENCOUNTER — Other Ambulatory Visit (INDEPENDENT_AMBULATORY_CARE_PROVIDER_SITE_OTHER): Payer: Medicare Other

## 2010-07-28 VITALS — BP 98/60 | HR 76 | Ht 60.0 in | Wt 185.0 lb

## 2010-07-28 DIAGNOSIS — R1013 Epigastric pain: Secondary | ICD-10-CM

## 2010-07-28 LAB — CBC WITH DIFFERENTIAL/PLATELET
Basophils Relative: 0.2 % (ref 0.0–3.0)
Eosinophils Relative: 0.1 % (ref 0.0–5.0)
HCT: 28.6 % — ABNORMAL LOW (ref 36.0–46.0)
Hemoglobin: 9.9 g/dL — ABNORMAL LOW (ref 12.0–15.0)
Lymphs Abs: 0.6 10*3/uL — ABNORMAL LOW (ref 0.7–4.0)
MCV: 96 fl (ref 78.0–100.0)
Monocytes Absolute: 0.3 10*3/uL (ref 0.1–1.0)
Neutrophils Relative %: 79.9 % — ABNORMAL HIGH (ref 43.0–77.0)
RBC: 2.98 Mil/uL — ABNORMAL LOW (ref 3.87–5.11)
WBC: 4.8 10*3/uL (ref 4.5–10.5)

## 2010-07-28 LAB — HEPATIC FUNCTION PANEL
ALT: 17 U/L (ref 0–35)
AST: 21 U/L (ref 0–37)
Total Bilirubin: 0.5 mg/dL (ref 0.3–1.2)

## 2010-07-28 NOTE — Patient Instructions (Addendum)
Go directly to the basement today to have your labs drawn. Take Tylenol Extra Strength every 6 hours as needed for pain. Your follow up visit with Dr. Olevia Perches is scheduled for 09/04/10 at 3:30pm. If your symptoms get worse, please call our office before your appointment.

## 2010-07-28 NOTE — Progress Notes (Signed)
Sophia NUR [id number] [birth year]   HISTORY OR PRESENT ILLNESS : [name] is a 77 year old female followed by Dr. Olevia Perches for history of iron deficiency anemia, severe gastritis, duodenitis, and diverticulosis. She was last seen in late March after being hospitalized with severe anemia. Her EGD revealed moderate gastritis and possible gastric AVM vrs. trauma. Colonoscopy unrevealing. Small bowel video capsule study revealed marked hemorrhagic gastritis. Patient had been on [name].  A followup upper endoscopy May 5 of this year showed severe gastritis and duodenitis. There was a suspicion for GAVE but biopsies were compatible with reactive gastropathy.  No H. pylori was identified. Patient gives a several month history of intermittent, gnawing epigastric pain relieved with heat . She doesn't really feel that the pain is GI in origin. No recent GERD, if patient does get heartburn she takes a second Dexilant and pain subsides. Appetitie good, BMs are normal.   Patient complains of weakness in her arms and pain in left upper arm relieved with lidoderm patchs.   Current Medications, Allergies, Past Medical History, Past Surgical History, Family History and Social History were reviewed in [employer] record.   PHYSICAL EXAMINATION : General: Well developed white female in no acute distress Head: Normocephalic and atraumatic Eyes:  sclerae anicteric,conjunctive pink. Ears: Normal auditory acuity Mouth: No deformity or lesions Neck: Supple, no masses.  Lungs: Clear throughout to auscultation Heart: Regular rate and rhythm; no murmurs heard Abdomen: Soft, nondistended, nontender. No masses or hepatomegaly noted. Normal bowel sounds Rectal: Not done Musculoskeletal: Symmetrical with no gross deformities  Skin: No lesions on visible extremities Extremities: No edema or deformities noted Neurological: Alert oriented x 4, grossly nonfocal Cervical Nodes:  No significant  cervical adenopathy Psychological:  Alert and cooperative. Normal mood and affect  ASSESSMENT AND PLAN :

## 2010-07-29 DIAGNOSIS — R1013 Epigastric pain: Secondary | ICD-10-CM | POA: Insufficient documentation

## 2010-07-29 NOTE — Assessment & Plan Note (Addendum)
Intermittent, gnawing epigastric pain. Patient doesn't feel that pain is GI in origin, she may be correct as pain relieved with heat. Her abdomial examination is benign. Patient does have a history of severe gastritis which has hopefully improved since discontinuation of [name].To be on the safe side, will check a CBC as well as LFTs and lipase. Continue daily PPI, heating pad as needed, avoid NSAIDS. May use Tylenol every 6 hours for pain. Patient will be called early next week with test results and any further recommendations.

## 2010-08-01 NOTE — Progress Notes (Signed)
Agree with initial assessment and plans 

## 2010-08-07 ENCOUNTER — Telehealth: Payer: Self-pay | Admitting: *Deleted

## 2010-08-07 DIAGNOSIS — D649 Anemia, unspecified: Secondary | ICD-10-CM

## 2010-08-07 NOTE — Telephone Encounter (Signed)
Message copied by Hulan Saas on Mon Aug 07, 2010 10:05 AM ------      Message from: Willia Craze      Created: Mon Aug 07, 2010  9:23 AM       Rollene Fare, please let patient know that hemoglobin dropped slightly but the previous result of 11.0 may have been falsely elevated as her hemoglobin usually runs in high 9's and that is where she is now. Let's check another CBC in couple of weeks to be on safe side. Thanks

## 2010-08-07 NOTE — Telephone Encounter (Signed)
Patient notified of lab results. She will come for repeat labs on 08/21/10. Note to remind patient with a call.

## 2010-08-14 ENCOUNTER — Telehealth: Payer: Self-pay | Admitting: *Deleted

## 2010-08-14 NOTE — Telephone Encounter (Signed)
Patient notified and she will get labs done.

## 2010-08-14 NOTE — Telephone Encounter (Signed)
Message copied by Hulan Saas on Mon Aug 14, 2010 10:02 AM ------      Message from: Hulan Saas      Created: Thu May 18, 2010  9:49 AM       Due for cbc 7/02/06/10. Call and remind patient

## 2010-08-18 ENCOUNTER — Telehealth: Payer: Self-pay | Admitting: *Deleted

## 2010-08-18 NOTE — Telephone Encounter (Signed)
Message copied by Hulan Saas on Fri Aug 18, 2010  3:32 PM ------      Message from: Hulan Saas      Created: Mon Aug 07, 2010 10:09 AM       Call and remind patient of CBC(PG)

## 2010-08-18 NOTE — Telephone Encounter (Signed)
Called and reminded patient of labs due next week.

## 2010-08-24 ENCOUNTER — Other Ambulatory Visit (INDEPENDENT_AMBULATORY_CARE_PROVIDER_SITE_OTHER): Payer: Medicare Other

## 2010-08-24 DIAGNOSIS — D500 Iron deficiency anemia secondary to blood loss (chronic): Secondary | ICD-10-CM

## 2010-08-24 LAB — CBC WITH DIFFERENTIAL/PLATELET
Eosinophils Absolute: 0 10*3/uL (ref 0.0–0.7)
Eosinophils Relative: 1.2 % (ref 0.0–5.0)
MCV: 97.8 fl (ref 78.0–100.0)
Monocytes Absolute: 0.4 10*3/uL (ref 0.1–1.0)
Neutrophils Relative %: 74.6 % (ref 43.0–77.0)
Platelets: 240 10*3/uL (ref 150.0–400.0)
WBC: 4 10*3/uL — ABNORMAL LOW (ref 4.5–10.5)

## 2010-08-31 ENCOUNTER — Other Ambulatory Visit: Payer: Self-pay | Admitting: Internal Medicine

## 2010-09-01 ENCOUNTER — Telehealth: Payer: Self-pay | Admitting: *Deleted

## 2010-09-01 DIAGNOSIS — D649 Anemia, unspecified: Secondary | ICD-10-CM

## 2010-09-01 NOTE — Telephone Encounter (Signed)
Message copied by Hulan Saas on Fri Sep 01, 2010  8:18 AM ------      Message from: Lafayette Dragon      Created: Tue Aug 29, 2010 10:47 PM       Please call pt with results, her H/H has dropped since last month from 9.9 to 8.7. Please tell her to  Take her IRON AND REPEAT CBC AND IRON STUDIES IN 2 WEEKS.

## 2010-09-01 NOTE — Telephone Encounter (Signed)
Spoke with patient and she states she has an appointment with Dr. Olevia Perches on 09/04/10 and wants to do labs then. She states she has seen black in stools on 08/17/10 and one other time since then.  She states she is getting worked up for a cornea transplant.

## 2010-09-04 ENCOUNTER — Ambulatory Visit (INDEPENDENT_AMBULATORY_CARE_PROVIDER_SITE_OTHER): Payer: Medicare Other | Admitting: Internal Medicine

## 2010-09-04 ENCOUNTER — Encounter: Payer: Self-pay | Admitting: Internal Medicine

## 2010-09-04 VITALS — BP 140/72 | HR 72 | Ht 60.0 in | Wt 187.0 lb

## 2010-09-04 DIAGNOSIS — R195 Other fecal abnormalities: Secondary | ICD-10-CM

## 2010-09-04 DIAGNOSIS — D509 Iron deficiency anemia, unspecified: Secondary | ICD-10-CM

## 2010-09-04 MED ORDER — IRON DEXTRAN 50 MG/ML IJ SOLN: INTRAMUSCULAR | Status: DC

## 2010-09-04 NOTE — Progress Notes (Signed)
Sophia [name] [birth year] MRN [id number]     History of Present Illness:  This is an 77 year old white female with chronic GI blood loss and a recent drop in hemoglobin to 8.7, prior Hgb 9.9 in June 2012. She has chronic iron deficiency anemia. She has been on oral iron supplements 3 times a day. Her last iron infusion was in March 2012. Her iron saturation was 6%. She has chronic gastritis and duodenitis. Her last upper endoscopy was in March 2012. A small bowel capsule endoscopy in March 2012 showed hemorrhagic gastritis and a question of GAVE syndrome. Her last colonoscopy in March 2012 was normal. She has been off Coumadin. Her platelet count is 240,000 with white cells of 4000. She has intermittent epigastric pain. She has been on Dexilant 60 mg daily.   Past Medical History  Diagnosis Date  . Congestive heart failure   . Aortic stenosis   . Hypertension   . Dyslipidemia   . Depression   . Duodenitis 03/15/10    per capsule endoscopy report  . Hemorrhagic gastritis 03/15/10    per capsule endoscopy report  . Diverticulosis 03/14/10  . Hyperplastic colon polyp   . Anemia   . DVT (deep venous thrombosis)   . Anxiety   . Arthritis   . Blood transfusion   . Cataract bil cateract removed  . GERD (gastroesophageal reflux disease)   . Heart murmur   . Hyperlipidemia   . Hiatal hernia   . Osteoporosis   . Neuropathy     bil big toes   Past Surgical History  Procedure Date  . Cholecystectomy   . Orif wrist fracture   . Fracture left knee 1993    x 2   . Foot surgery 1998  . Tubal ligation   . Plantar fasciitis repair     left    reports that she has quit smoking. She has never used smokeless tobacco. She reports that she does not drink alcohol or use illicit drugs. family history includes Uterine cancer in her sister. Allergies  Allergen Reactions  . [name]   . [name]         Review of Systems: Denies dysphagia odynophagia shortness of  breath or chest pain  The remainder of the 10  point ROS is negative except as outlined in H&P   Physical Exam: General appearance  Well developed in no distress, appears pale, ambulates independently   Assessment and Plan  Problem #1 Chronic ongoing GI blood loss from erosive gastritis. 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 She has had a recent drop in hemoglobin from 9.9 to 8.7. We will schedule her for iron infusion of Imfed,1,000 mg over a period of 4-5 hours IV. She will have her CBC rechecked 2 weeks following infusion.   09/04/2010 [name]

## 2010-09-04 NOTE — Patient Instructions (Signed)
We will contact you regarding an appointment for iron infusion.

## 2010-09-08 ENCOUNTER — Encounter (HOSPITAL_COMMUNITY): Payer: Medicare Other | Attending: Internal Medicine

## 2010-09-08 DIAGNOSIS — Z79899 Other long term (current) drug therapy: Secondary | ICD-10-CM | POA: Insufficient documentation

## 2010-09-08 DIAGNOSIS — D509 Iron deficiency anemia, unspecified: Secondary | ICD-10-CM | POA: Insufficient documentation

## 2010-09-10 ENCOUNTER — Other Ambulatory Visit: Payer: Self-pay | Admitting: Internal Medicine

## 2010-09-15 ENCOUNTER — Other Ambulatory Visit (INDEPENDENT_AMBULATORY_CARE_PROVIDER_SITE_OTHER): Payer: Medicare Other

## 2010-09-15 DIAGNOSIS — D509 Iron deficiency anemia, unspecified: Secondary | ICD-10-CM

## 2010-09-15 LAB — CBC WITH DIFFERENTIAL/PLATELET
Basophils Relative: 0.4 % (ref 0.0–3.0)
Eosinophils Relative: 2.6 % (ref 0.0–5.0)
Lymphocytes Relative: 15.2 % (ref 12.0–46.0)
MCV: 101.4 fl — ABNORMAL HIGH (ref 78.0–100.0)
Monocytes Relative: 10.7 % (ref 3.0–12.0)
Neutrophils Relative %: 71.1 % (ref 43.0–77.0)
RBC: 2.49 Mil/uL — ABNORMAL LOW (ref 3.87–5.11)
WBC: 3.4 10*3/uL — ABNORMAL LOW (ref 4.5–10.5)

## 2010-09-18 ENCOUNTER — Telehealth: Payer: Self-pay | Admitting: *Deleted

## 2010-09-18 NOTE — Telephone Encounter (Signed)
Scheduled patient at [facility] short stay on 09/21/10 at 8:00 AM for 2 units PRBC.Orders faxed. Patient notified,

## 2010-09-18 NOTE — Telephone Encounter (Signed)
Message copied by Hulan Saas on Mon Sep 18, 2010  1:54 PM ------      Message from: Lafayette Dragon      Created: Mon Sep 18, 2010  1:29 PM       Please set up for blood transfusion, 2 units IV this week. DB

## 2010-09-21 ENCOUNTER — Encounter (HOSPITAL_COMMUNITY): Payer: Medicare Other

## 2010-09-21 ENCOUNTER — Other Ambulatory Visit: Payer: Self-pay | Admitting: Internal Medicine

## 2010-09-22 LAB — CROSSMATCH
Antibody Screen: NEGATIVE
Unit division: 0

## 2010-10-01 ENCOUNTER — Other Ambulatory Visit: Payer: Self-pay | Admitting: Internal Medicine

## 2010-10-06 ENCOUNTER — Telehealth: Payer: Self-pay | Admitting: Internal Medicine

## 2010-10-06 DIAGNOSIS — D649 Anemia, unspecified: Secondary | ICD-10-CM

## 2010-10-06 NOTE — Telephone Encounter (Signed)
CBC on 9/4 ok

## 2010-10-06 NOTE — Telephone Encounter (Signed)
Spoke with patient and she is feeling weak today. Denies any bleeding or black stools. Not SOB. She had labs on 09/25/10 with Dr. Sallyanne Havers on 09/25/10 Hgb 9.9 and Hct 29.6. She would like to come for CBC on Tuesday. States [place] is [place] to be evaluated for eye surgery and she wants to be sure her Hgb is good enough. Hx chronic ongoing GI blood loss from erosive gastritis Please, advise.

## 2010-10-10 NOTE — Telephone Encounter (Signed)
Spoke with patient and told her she can come today for labs. She states she cannot come today but will come on 10/10/10

## 2010-10-12 ENCOUNTER — Encounter: Payer: Self-pay | Admitting: Internal Medicine

## 2010-10-12 ENCOUNTER — Telehealth: Payer: Self-pay | Admitting: *Deleted

## 2010-10-12 DIAGNOSIS — D649 Anemia, unspecified: Secondary | ICD-10-CM

## 2010-10-12 NOTE — Telephone Encounter (Signed)
Error

## 2010-10-12 NOTE — Telephone Encounter (Signed)
Called patient to check on why she did not come for CBC and she states she had to leave her MD visit without being seen today because she did not sleep last night and she could not come for labs. She states she does not have a ride to get here and she is not sure when she can come. She has an appointment on 10/18/10 at [facility] for her eyes and is not sure she can come before then.

## 2010-10-12 NOTE — Telephone Encounter (Signed)
Message copied by Hulan Saas on Thu Oct 12, 2010  4:35 PM ------      Message from: Hulan Saas      Created: Tue Oct 10, 2010  8:38 AM       Due foe CBC  On 9.5.12

## 2010-10-13 ENCOUNTER — Other Ambulatory Visit (INDEPENDENT_AMBULATORY_CARE_PROVIDER_SITE_OTHER): Payer: Medicare Other

## 2010-10-13 DIAGNOSIS — D649 Anemia, unspecified: Secondary | ICD-10-CM

## 2010-10-13 LAB — CBC WITH DIFFERENTIAL/PLATELET
Basophils Absolute: 0 10*3/uL (ref 0.0–0.1)
Lymphocytes Relative: 11.2 % — ABNORMAL LOW (ref 12.0–46.0)
Monocytes Relative: 9.3 % (ref 3.0–12.0)
Neutrophils Relative %: 78.3 % — ABNORMAL HIGH (ref 43.0–77.0)
Platelets: 259 10*3/uL (ref 150.0–400.0)
RDW: 14.9 % — ABNORMAL HIGH (ref 11.5–14.6)
WBC: 5.1 10*3/uL (ref 4.5–10.5)

## 2010-10-13 NOTE — Telephone Encounter (Signed)
There is a CBC from today, Hgb  up to 9.8 from 8.3. She can wait 3-4 weeks and have it rechecked.

## 2010-10-16 NOTE — Progress Notes (Signed)
Quick Note:  This shows stable to better Hgb Forwarding to Dr. Olevia Perches ______

## 2010-10-16 NOTE — Telephone Encounter (Signed)
Patient given results. Labs in EPIC. Note to remind patient.

## 2010-10-31 ENCOUNTER — Other Ambulatory Visit: Payer: Self-pay | Admitting: Internal Medicine

## 2010-11-03 ENCOUNTER — Telehealth: Payer: Self-pay | Admitting: *Deleted

## 2010-11-03 MED ORDER — DEXLANSOPRAZOLE 60 MG PO CPDR: 60.0000 mg | DELAYED_RELEASE_CAPSULE | Freq: Every day | ORAL | Status: DC

## 2010-11-03 NOTE — Telephone Encounter (Signed)
Patient's son here to se if he can get some samples of Dexilant for his mother. She is going out of town and her refill is not due yet but she will run out while out of town Samples given.

## 2010-11-07 LAB — BASIC METABOLIC PANEL
BUN: 12 mg/dL (ref 6–23)
BUN: 18 mg/dL (ref 6–23)
BUN: 9 mg/dL (ref 6–23)
CO2: 24 mEq/L (ref 19–32)
CO2: 25 mEq/L (ref 19–32)
CO2: 28 mEq/L (ref 19–32)
CO2: 29 mEq/L (ref 19–32)
CO2: 30 mEq/L (ref 19–32)
Calcium: 9.1 mg/dL (ref 8.4–10.5)
Calcium: 9.1 mg/dL (ref 8.4–10.5)
Chloride: 100 mEq/L (ref 96–112)
Chloride: 103 mEq/L (ref 96–112)
Chloride: 103 mEq/L (ref 96–112)
Chloride: 106 mEq/L (ref 96–112)
Chloride: 97 mEq/L (ref 96–112)
Creatinine, Ser: 0.95 mg/dL (ref 0.4–1.2)
Creatinine, Ser: 1.01 mg/dL (ref 0.4–1.2)
Creatinine, Ser: 1.01 mg/dL (ref 0.4–1.2)
Creatinine, Ser: 1.11 mg/dL (ref 0.4–1.2)
Creatinine, Ser: 1.21 mg/dL — ABNORMAL HIGH (ref 0.4–1.2)
GFR calc Af Amer: 52 mL/min — ABNORMAL LOW (ref >60)
GFR calc Af Amer: 58 mL/min — ABNORMAL LOW (ref >60)
GFR calc Af Amer: >60 mL/min (ref >60)
GFR calc Af Amer: >60 mL/min (ref >60)
GFR calc Af Amer: >60 mL/min (ref >60)
GFR calc non Af Amer: 47 mL/min — ABNORMAL LOW (ref >60)
Glucose, Bld: 118 mg/dL — ABNORMAL HIGH (ref 70–99)
Glucose, Bld: 125 mg/dL — ABNORMAL HIGH (ref 70–99)
Potassium: 4 mEq/L (ref 3.5–5.1)
Potassium: 4.1 mEq/L (ref 3.5–5.1)
Potassium: 4.1 mEq/L (ref 3.5–5.1)
Potassium: 4.4 mEq/L (ref 3.5–5.1)
Potassium: 4.6 mEq/L (ref 3.5–5.1)
Sodium: 137 mEq/L (ref 135–145)
Sodium: 138 mEq/L (ref 135–145)

## 2010-11-07 LAB — CBC
HCT: 25.1 % — ABNORMAL LOW (ref 36.0–46.0)
HCT: 27.6 % — ABNORMAL LOW (ref 36.0–46.0)
HCT: 29.7 % — ABNORMAL LOW (ref 36.0–46.0)
Hemoglobin: 8.4 g/dL — ABNORMAL LOW (ref 12.0–15.0)
Hemoglobin: 9.6 g/dL — ABNORMAL LOW (ref 12.0–15.0)
MCHC: 32.3 g/dL (ref 30.0–36.0)
MCHC: 32.5 g/dL (ref 30.0–36.0)
MCHC: 32.8 g/dL (ref 30.0–36.0)
MCHC: 33.4 g/dL (ref 30.0–36.0)
MCHC: 33.5 g/dL (ref 30.0–36.0)
MCV: 82.9 fL (ref 78.0–100.0)
MCV: 84 fL (ref 78.0–100.0)
MCV: 84.1 fL (ref 78.0–100.0)
MCV: 84.3 fL (ref 78.0–100.0)
MCV: 84.6 fL (ref 78.0–100.0)
Platelets: 193 10*3/uL (ref 150–400)
Platelets: 221 10*3/uL (ref 150–400)
Platelets: 221 10*3/uL (ref 150–400)
RBC: 3.03 MIL/uL — ABNORMAL LOW (ref 3.87–5.11)
RBC: 3.12 MIL/uL — ABNORMAL LOW (ref 3.87–5.11)
RBC: 3.15 MIL/uL — ABNORMAL LOW (ref 3.87–5.11)
RBC: 3.52 MIL/uL — ABNORMAL LOW (ref 3.87–5.11)
RDW: 15.9 % — ABNORMAL HIGH (ref 11.5–15.5)
RDW: 16.7 % — ABNORMAL HIGH (ref 11.5–15.5)
WBC: 4 10*3/uL (ref 4.0–10.5)
WBC: 5 10*3/uL (ref 4.0–10.5)

## 2010-11-07 LAB — PROTIME-INR
INR: 1 (ref 0.00–1.49)
INR: 1 (ref 0.00–1.49)
INR: 1.1 (ref 0.00–1.49)
Prothrombin Time: 13.4 seconds (ref 11.6–15.2)
Prothrombin Time: 13.6 seconds (ref 11.6–15.2)
Prothrombin Time: 13.9 seconds (ref 11.6–15.2)
Prothrombin Time: 13.9 seconds (ref 11.6–15.2)

## 2010-11-07 LAB — URINALYSIS, ROUTINE W REFLEX MICROSCOPIC
Bilirubin Urine: NEGATIVE
Hgb urine dipstick: NEGATIVE
Protein, ur: NEGATIVE mg/dL
Urobilinogen, UA: 0.2 mg/dL (ref 0.0–1.0)

## 2010-11-07 LAB — CROSSMATCH

## 2010-11-07 LAB — URINE CULTURE: Special Requests: NEGATIVE

## 2010-11-10 ENCOUNTER — Telehealth: Payer: Self-pay | Admitting: *Deleted

## 2010-11-10 LAB — I-STAT 8, (EC8 V) (CONVERTED LAB)
BUN: 13
Bicarbonate: 26.2 — ABNORMAL HIGH
Chloride: 97
Glucose, Bld: 113 — ABNORMAL HIGH
HCT: 37
Operator id: 198171
pCO2, Ven: 38.7 — ABNORMAL LOW
pH, Ven: 7.438 — ABNORMAL HIGH

## 2010-11-10 NOTE — Telephone Encounter (Signed)
Left message for patient reminding her of labs that are due on 11/15/10.

## 2010-11-10 NOTE — Telephone Encounter (Signed)
Message copied by Hulan Saas on Fri Nov 10, 2010  9:21 AM ------      Message from: Hulan Saas      Created: Mon Oct 16, 2010  9:31 AM       Patient due for CBC 11/15/10 call and remind

## 2010-11-13 ENCOUNTER — Telehealth: Payer: Self-pay | Admitting: Internal Medicine

## 2010-11-13 NOTE — Telephone Encounter (Signed)
Left message to call back  

## 2010-11-13 NOTE — Telephone Encounter (Signed)
Left message for return phone call

## 2010-11-14 NOTE — Telephone Encounter (Signed)
Pts son came to the office. Pt is currently living 3.5 hours away and recently had eye surgery and has some other procedures to follow. Requesting that cbc needed 11/15/10 be drawn at [place]. Rx given to son for the cbc.

## 2010-11-23 ENCOUNTER — Telehealth: Payer: Self-pay | Admitting: Internal Medicine

## 2010-11-23 NOTE — Telephone Encounter (Signed)
Spoke with patient's son. Received labs from [place] labs to her other physicians as requested. Labs on Dr. Nichola Sizer desk for review.

## 2010-11-23 NOTE — Telephone Encounter (Signed)
Patient's son calling to report his mother had her lab work yesterday at [facility] in [place], [place] and we should be getting the results faxed to [place]. He wants the results called to him. Also wants labs faxed to Dr. Shelia Media and Dr. Estanislado Pandy.

## 2010-11-29 ENCOUNTER — Telehealth: Payer: Self-pay | Admitting: *Deleted

## 2010-11-29 ENCOUNTER — Encounter: Payer: Self-pay | Admitting: Internal Medicine

## 2010-11-29 NOTE — Telephone Encounter (Signed)
Spoke with patient's son

## 2010-11-29 NOTE — Telephone Encounter (Signed)
Message copied by Hulan Saas on Wed Nov 29, 2010  8:56 AM ------      Message from: Lafayette Dragon      Created: Tue Nov 28, 2010 10:57 PM      Regarding: repeat CBC       Please have her cbc BE CHECKED EVERY 4 WEEKS.      ----- Message -----         From: Hulan Saas, RN         Sent: 11/27/2010   3:14 PM           To: Lafayette Dragon, MD            Dr. Olevia Perches,      Sophia [name] is living with her daughter at this time. She had a CBC on 11/22/10 hgb-10.8, hct-32.5. [facility] is doing her labs now. When should she have another CBC?      Sophia [name]

## 2010-11-29 NOTE — Telephone Encounter (Signed)
Spoke with patient's son. Mailed rx for labs to [facility] at  [place], [place] [address]

## 2010-12-05 ENCOUNTER — Other Ambulatory Visit: Payer: Self-pay | Admitting: *Deleted

## 2010-12-05 MED ORDER — FERROUS SULFATE 325 (65 FE) MG PO TABS: 325.0000 mg | ORAL_TABLET | Freq: Three times a day (TID) | ORAL | Status: DC

## 2010-12-12 ENCOUNTER — Encounter: Payer: Self-pay | Admitting: Internal Medicine

## 2010-12-12 NOTE — Telephone Encounter (Signed)
Error

## 2010-12-26 ENCOUNTER — Telehealth: Payer: Self-pay | Admitting: Internal Medicine

## 2010-12-26 DIAGNOSIS — D649 Anemia, unspecified: Secondary | ICD-10-CM

## 2010-12-26 NOTE — Telephone Encounter (Signed)
Spoke with patient's son and told him we have received her labs. On 12/22/10, Hgb 9.9, hct 30.5. Last CBC on 11/22/10, hgb 10.8, hct 32.5. Patient will be in [place] next Thursday for OV with Dr. Shelia Media  If Dr. Olevia Perches thinks she needs to be transfused, she could do this on Friday 01/05/11. Please, advise.

## 2010-12-26 NOTE — Telephone Encounter (Signed)
She does not need a Txn for Hgb 9.9 but next week, she ought to have a STAT CBC done in our office or at Dr Linnell Fulling' and we can decide about TXN. Is it possible to "reserve" a Txn spot  for her at [facility] outpatient in case she will need a Txn?

## 2010-12-27 NOTE — Telephone Encounter (Signed)
Spoke with pts son and he is aware. Pt will have the CBC checked by Dr. Linnell Fulling' office. Pt scheduled for type and cross and blood if needed at [place] stay 01-05-11@8am . If pt does not need the blood we can cancel the appt.

## 2011-01-02 NOTE — Telephone Encounter (Signed)
Dr Shelia Media does not plan to check CBC.  She will come for a CBC on Thursday am.

## 2011-01-04 ENCOUNTER — Other Ambulatory Visit (INDEPENDENT_AMBULATORY_CARE_PROVIDER_SITE_OTHER): Payer: Medicare Other

## 2011-01-04 ENCOUNTER — Telehealth: Payer: Self-pay | Admitting: Internal Medicine

## 2011-01-04 DIAGNOSIS — D649 Anemia, unspecified: Secondary | ICD-10-CM

## 2011-01-04 LAB — CBC WITH DIFFERENTIAL/PLATELET
Basophils Relative: 0.2 % (ref 0.0–3.0)
Eosinophils Relative: 0.6 % (ref 0.0–5.0)
Lymphocytes Relative: 12.3 % (ref 12.0–46.0)
Neutrophils Relative %: 80.2 % — ABNORMAL HIGH (ref 43.0–77.0)
RBC: 2.41 Mil/uL — ABNORMAL LOW (ref 3.87–5.11)
WBC: 5.2 10*3/uL (ref 4.5–10.5)

## 2011-01-04 NOTE — Telephone Encounter (Signed)
Patient is having an air bubble under her cornea tomorrow at 1:00.  She will not be sedated for this.  Dr Laray Anger [phone].  I am unable to reschedule transfusion at [facility] or [facility].  Dr Olevia Perches please advise step.  Patient is not symptomatic at this time.

## 2011-01-04 NOTE — Telephone Encounter (Signed)
Reviewed and agree. We will recheck her CBC before rescheduling the infusion.

## 2011-01-04 NOTE — Telephone Encounter (Signed)
Son called and the patient needs to have urgent eye surgery tomorrow at [facility] and won't be able to have transfusion if it is still needed.  Her son will bring her by today to get CBC.  I have canceled appt for short stay for tomorrow.  Will reschedule transfusion if needed based on Dr Brodie's orders and results.

## 2011-01-05 ENCOUNTER — Inpatient Hospital Stay (HOSPITAL_COMMUNITY): Admission: RE | Admit: 2011-01-05 | Payer: Medicare Other | Source: Ambulatory Visit

## 2011-01-05 ENCOUNTER — Other Ambulatory Visit: Payer: Self-pay

## 2011-01-05 DIAGNOSIS — D649 Anemia, unspecified: Secondary | ICD-10-CM

## 2011-01-05 NOTE — Telephone Encounter (Signed)
Patient is scheduled for T&C on 01/09/11 and transfusion on 01/10/11 8:00 Daughter is aware of both appointments

## 2011-01-08 ENCOUNTER — Telehealth: Payer: Self-pay | Admitting: Internal Medicine

## 2011-01-08 NOTE — Telephone Encounter (Signed)
OK to draw CBC tomorrow. , if Hgb less than 8.0 gm, she will be asked to go to ER for transfusion.

## 2011-01-08 NOTE — Telephone Encounter (Signed)
Patient is feeling weaker.  Her son would like to have a CBC drawn while she is at the hospital tomorrow for T&C.  Her son is thinking that she has dropped more.  She is scheduled for T&C for blood tomorrow and transfusion on Wed 01/10/11

## 2011-01-09 ENCOUNTER — Encounter (HOSPITAL_COMMUNITY)
Admission: RE | Admit: 2011-01-09 | Discharge: 2011-01-09 | Disposition: A | Discharge status: Home / Self Care | Payer: Medicare Other | Source: Ambulatory Visit | Attending: Internal Medicine | Admitting: Internal Medicine

## 2011-01-09 ENCOUNTER — Encounter: Payer: Self-pay | Admitting: Internal Medicine

## 2011-01-09 ENCOUNTER — Other Ambulatory Visit: Payer: Self-pay

## 2011-01-09 ENCOUNTER — Observation Stay (HOSPITAL_COMMUNITY)
Admission: EM | Admit: 2011-01-09 | Discharge: 2011-01-11 | Disposition: A | Discharge status: Home / Self Care | Payer: Medicare Other | Attending: Internal Medicine | Admitting: Internal Medicine

## 2011-01-09 ENCOUNTER — Encounter (HOSPITAL_COMMUNITY): Payer: Self-pay | Admitting: *Deleted

## 2011-01-09 DIAGNOSIS — R6889 Other general symptoms and signs: Secondary | ICD-10-CM | POA: Insufficient documentation

## 2011-01-09 DIAGNOSIS — D509 Iron deficiency anemia, unspecified: Secondary | ICD-10-CM | POA: Insufficient documentation

## 2011-01-09 DIAGNOSIS — D508 Other iron deficiency anemias: Secondary | ICD-10-CM

## 2011-01-09 DIAGNOSIS — D500 Iron deficiency anemia secondary to blood loss (chronic): Principal | ICD-10-CM | POA: Diagnosis present

## 2011-01-09 DIAGNOSIS — K2950 Unspecified chronic gastritis without bleeding: Secondary | ICD-10-CM | POA: Diagnosis present

## 2011-01-09 DIAGNOSIS — D649 Anemia, unspecified: Secondary | ICD-10-CM

## 2011-01-09 DIAGNOSIS — M79609 Pain in unspecified limb: Secondary | ICD-10-CM | POA: Insufficient documentation

## 2011-01-09 DIAGNOSIS — I959 Hypotension, unspecified: Secondary | ICD-10-CM | POA: Insufficient documentation

## 2011-01-09 DIAGNOSIS — R531 Weakness: Secondary | ICD-10-CM | POA: Diagnosis present

## 2011-01-09 DIAGNOSIS — G5790 Unspecified mononeuropathy of unspecified lower limb: Secondary | ICD-10-CM | POA: Insufficient documentation

## 2011-01-09 DIAGNOSIS — I350 Nonrheumatic aortic (valve) stenosis: Secondary | ICD-10-CM | POA: Insufficient documentation

## 2011-01-09 DIAGNOSIS — K2940 Chronic atrophic gastritis without bleeding: Secondary | ICD-10-CM | POA: Insufficient documentation

## 2011-01-09 DIAGNOSIS — K219 Gastro-esophageal reflux disease without esophagitis: Secondary | ICD-10-CM | POA: Diagnosis present

## 2011-01-09 LAB — CBC
HCT: 25.9 % — ABNORMAL LOW (ref 36.0–46.0)
Hemoglobin: 7.5 g/dL — ABNORMAL LOW (ref 12.0–15.0)
Hemoglobin: 7.9 g/dL — ABNORMAL LOW (ref 12.0–15.0)
MCH: 31.9 pg (ref 26.0–34.0)
MCHC: 30.5 g/dL (ref 30.0–36.0)
MCV: 102.1 fL — ABNORMAL HIGH (ref 78.0–100.0)
RBC: 2.35 MIL/uL — ABNORMAL LOW (ref 3.87–5.11)
RBC: 2.53 MIL/uL — ABNORMAL LOW (ref 3.87–5.11)
WBC: 6 10*3/uL (ref 4.0–10.5)

## 2011-01-09 LAB — COMPREHENSIVE METABOLIC PANEL
ALT: 13 U/L (ref 0–35)
Alkaline Phosphatase: 56 U/L (ref 39–117)
BUN: 21 mg/dL (ref 6–23)
CO2: 22 mEq/L (ref 19–32)
Chloride: 107 mEq/L (ref 96–112)
GFR calc Af Amer: 53 mL/min — ABNORMAL LOW (ref >90)
Glucose, Bld: 118 mg/dL — ABNORMAL HIGH (ref 70–99)
Potassium: 4.1 mEq/L (ref 3.5–5.1)
Total Bilirubin: 0.1 mg/dL — ABNORMAL LOW (ref 0.3–1.2)

## 2011-01-09 LAB — VITAMIN B12: Vitamin B-12: 530 pg/mL (ref 211–911)

## 2011-01-09 LAB — RETICULOCYTES
RBC.: 2.32 MIL/uL — ABNORMAL LOW (ref 3.87–5.11)
Retic Ct Pct: 7.1 % — ABNORMAL HIGH (ref 0.4–3.1)

## 2011-01-09 LAB — FERRITIN: Ferritin: 25 ng/mL (ref 10–291)

## 2011-01-09 MED ORDER — ONDANSETRON HCL 4 MG PO TABS: 4.0000 mg | ORAL_TABLET | Freq: Four times a day (QID) | ORAL | Status: DC | PRN

## 2011-01-09 MED ORDER — ONDANSETRON HCL 4 MG/2ML IJ SOLN: 4.0000 mg | Freq: Four times a day (QID) | INTRAMUSCULAR | Status: DC | PRN

## 2011-01-09 MED ORDER — MORPHINE SULFATE 2 MG/ML IJ SOLN
1.0000 mg | INTRAMUSCULAR | Status: DC | PRN
  Filled 2011-01-09: qty 1

## 2011-01-09 MED ORDER — VERAPAMIL HCL ER 240 MG PO TBCR
240.0000 mg | EXTENDED_RELEASE_TABLET | Freq: Every day | ORAL | Status: DC
  Administered 2011-01-10 – 2011-01-11 (×2): 240 mg via ORAL
  Filled 2011-01-09 (×3): qty 1

## 2011-01-09 MED ORDER — SODIUM CHLORIDE 0.9 % IV SOLN
INTRAVENOUS | Status: DC
  Administered 2011-01-09: 17:00:00 via INTRAVENOUS

## 2011-01-09 MED ORDER — PANTOPRAZOLE SODIUM 40 MG PO TBEC
40.0000 mg | DELAYED_RELEASE_TABLET | Freq: Every day | ORAL | Status: DC
  Administered 2011-01-10 – 2011-01-11 (×2): 40 mg via ORAL
  Filled 2011-01-09 (×2): qty 1

## 2011-01-09 MED ORDER — ROSUVASTATIN CALCIUM 5 MG PO TABS
5.0000 mg | ORAL_TABLET | Freq: Every day | ORAL | Status: DC
  Filled 2011-01-09 (×3): qty 1

## 2011-01-09 MED ORDER — PREDNISOLONE ACETATE 1 % OP SUSP
1.0000 [drp] | Freq: Four times a day (QID) | OPHTHALMIC | Status: DC
  Administered 2011-01-09 – 2011-01-11 (×6): 1 [drp] via OPHTHALMIC
  Filled 2011-01-09: qty 1

## 2011-01-09 MED ORDER — ALPRAZOLAM 0.5 MG PO TABS
0.5000 mg | ORAL_TABLET | Freq: Two times a day (BID) | ORAL | Status: DC | PRN
  Administered 2011-01-09 – 2011-01-11 (×3): 0.5 mg via ORAL
  Filled 2011-01-09 (×3): qty 1

## 2011-01-09 MED ORDER — GABAPENTIN 800 MG PO TABS
400.0000 mg | ORAL_TABLET | Freq: Two times a day (BID) | ORAL | Status: DC
  Administered 2011-01-10: 400 mg via ORAL
  Filled 2011-01-09 (×5): qty 1

## 2011-01-09 MED ORDER — SIMVASTATIN 20 MG PO TABS: 20.0000 mg | ORAL_TABLET | Freq: Every day | ORAL | Status: DC

## 2011-01-09 MED ORDER — ACETAMINOPHEN 325 MG PO TABS
650.0000 mg | ORAL_TABLET | Freq: Four times a day (QID) | ORAL | Status: DC | PRN
  Administered 2011-01-10: 650 mg via ORAL
  Filled 2011-01-09: qty 2

## 2011-01-09 MED ORDER — FERROUS SULFATE 325 (65 FE) MG PO TABS
325.0000 mg | ORAL_TABLET | Freq: Three times a day (TID) | ORAL | Status: DC
  Administered 2011-01-10 – 2011-01-11 (×4): 325 mg via ORAL
  Filled 2011-01-09 (×8): qty 1

## 2011-01-09 MED ORDER — LIDOCAINE 5 % EX PTCH
1.0000 | MEDICATED_PATCH | CUTANEOUS | Status: DC
  Administered 2011-01-09 – 2011-01-10 (×2): 1 via TRANSDERMAL
  Filled 2011-01-09 (×3): qty 1

## 2011-01-09 MED ORDER — ACETAMINOPHEN 650 MG RE SUPP: 650.0000 mg | Freq: Four times a day (QID) | RECTAL | Status: DC | PRN

## 2011-01-09 MED ORDER — VERAPAMIL HCL ER 240 MG PO CP24: 240.0000 mg | ORAL_CAPSULE | Freq: Every day | ORAL | Status: DC

## 2011-01-09 MED ORDER — SODIUM CHLORIDE 0.9 % IV SOLN: INTRAVENOUS | Status: AC

## 2011-01-09 MED ORDER — ZOLPIDEM TARTRATE 5 MG PO TABS
5.0000 mg | ORAL_TABLET | Freq: Every evening | ORAL | Status: DC | PRN
  Administered 2011-01-09 – 2011-01-10 (×2): 5 mg via ORAL
  Filled 2011-01-09 (×2): qty 1

## 2011-01-09 NOTE — Telephone Encounter (Signed)
Patient notified

## 2011-01-09 NOTE — ED Provider Notes (Signed)
History     CSN: [id number] Arrival date & time: 01/09/2011 11:24 AM   First MD Initiated Contact with Patient 01/09/11 1152      Chief Complaint  Patient presents with  . Anemia    (Consider location/radiation/quality/duration/timing/severity/associated sxs/prior treatment) HPI  Patient relates she feels weak. She states "my body is gone". He denies chest pain shortness of breath abdominal pain melena blood in stool passing out. Son relates she's had anemia and has had to be transfused a couple times in the past. She was scheduled to be transfused 4 days ago but she got canceled because of another emergent patient. He states in September her hemoglobin was 9.9 he states that last week her hemoglobin was 9.9 and then Thursday 5 days ago 7.8. She is scheduled to have blood transfusion done in short stay tomorrow. Son states however she cannot wait. Patient has already been to the lab this morning and had her blood drawn in anticipation of a transfusion tomorrow. Patient is also beginning iron transfusions. He states she's had endoscopy twice and colonoscopy dysuria by Dr. Olevia Perches and her diagnosis is chronic anemia  Primary care physician Dr. Shelia Media GI Dr Olevia Perches  Past Medical History  Diagnosis Date  . Congestive heart failure   . Aortic stenosis   . Hypertension   . Dyslipidemia   . Depression   . Duodenitis 03/15/10    per capsule endoscopy report  . Hemorrhagic gastritis 03/15/10    per capsule endoscopy report  . Diverticulosis 03/14/10  . Hyperplastic colon polyp   . Anemia   . DVT (deep venous thrombosis)   . Anxiety   . Arthritis   . Blood transfusion   . Cataract bil cateract removed  . GERD (gastroesophageal reflux disease)   . Heart murmur   . Hyperlipidemia   . Hiatal hernia   . Osteoporosis   . Neuropathy     bil big toes    Past Surgical History  Procedure Date  . Cholecystectomy   . Orif wrist fracture   . Fracture left knee 1993    x 2   . Foot surgery  1998  . Tubal ligation   . Plantar fasciitis repair     left    Family History  Problem Relation Age of Onset  . Uterine cancer Sister     Spread to colon     History  Substance Use Topics  . Smoking status: Former Research scientist (life sciences)  . Smokeless tobacco: Never Used  . Alcohol Use: No   easily lives alone currently living with her daughter  OB History    Grav Para Term Preterm Abortions TAB SAB Ect Mult Living                  Review of Systems  All other systems reviewed and are negative.    Allergies  Bee; Lyrica; [name]; and [name]  Home Medications   Current Outpatient Rx  Name Route Sig Dispense Refill  . ALPRAZOLAM 0.5 MG PO TABS  1/2- 1 tab by mouth three times a day as needed    . ATORVASTATIN CALCIUM 40 MG PO TABS Oral Take 40 mg by mouth. Take 1 tab by mouth every PM     . CALCIUM CITRATE-VITAMIN D 315-200 MG-UNIT PO TABS  1 tablet. Take 3 tablets by mouth once daily    . VITAMIN D3 1000 UNITS PO CAPS Oral Take by mouth. Take 1 tablet by mouth once daily     .  DENOSUMAB 60 MG/ML [place] SOLN Subcutaneous Inject 60 mg into the skin once. One shot every 6 months.     . DEXILANT 60 MG PO CPDR  TAKE ONE CAPSULE BY MOUTH EVERY DAY 30 capsule 2  . FERROUS SULFATE 325 (65 FE) MG PO TABS Oral Take 1 tablet (325 mg total) by mouth 3 (three) times daily. 90 tablet 2  . GABAPENTIN 400 MG PO TABS Oral Take 400 mg by mouth 2 (two) times daily.     [name] HYDROCODONE-ACETAMINOPHEN 5-500 MG PO TABS  Take 1/2-1 tablet by mouth at bedtime     . IRON DEXTRAN 50 MG/ML IJ SOLN  Iron infusion- Infed infustion over 4 hours of pharmacy calculated dose. No test dose needed. Weight-187 lbs Height - 5'. 1 mL 0  . LIDOCAINE 5 % EX PTCH Transdermal Place onto the skin. Use 1-3 patches topically every 12 hours     . MOXIFLOXACIN HCL 0.5 % OP SOLN Left Eye Place 1 drop into the left eye 4 (four) times daily.      [name] PREDNISOLONE ACETATE 1 % OP SUSP Left Eye Place 1 drop into the left  eye 4 (four) times daily.      [name] VERAPAMIL HCL ER 240 MG PO CP24  Take 1 tablet by mouth daily    . EPIPEN 2-PAK IJ Injection Inject as directed. Use as directed for anaphylaxis      BP 94/31  Pulse 59  Temp(Src) 98.6 F (37 C) (Oral)  Resp 17  SpO2 95%  Hypotension (94/31) bradycardia otherwise normal  Physical Exam  Nursing note and vitals reviewed. Constitutional: She is oriented to person, place, and time. She appears well-developed and well-nourished.  Non-toxic appearance. She does not appear ill. No distress.  HENT:  Head: Normocephalic and atraumatic.  Right Ear: External ear normal.  Left Ear: External ear normal.  Nose: Nose normal. No mucosal edema or rhinorrhea.  Mouth/Throat: Oropharynx is clear and moist and mucous membranes are normal. No dental abscesses or uvula swelling.  Eyes: Conjunctivae and EOM are normal. Pupils are equal, round, and reactive to light.  Neck: Normal range of motion and full passive range of motion without pain. Neck supple.  Cardiovascular: Normal rate, regular rhythm and normal heart sounds.  Exam reveals no gallop and no friction rub.   No murmur heard. Pulmonary/Chest: Effort normal and breath sounds normal. No respiratory distress. She has no wheezes. She has no rhonchi. She has no rales. She exhibits no tenderness and no crepitus.  Abdominal: Soft. Normal appearance and bowel sounds are normal. She exhibits no distension. There is no tenderness. There is no rebound and no guarding.  Musculoskeletal: Normal range of motion. She exhibits no edema and no tenderness.       Moves all extremities well.   Neurological: She is alert and oriented to person, place, and time. She has normal strength. No cranial nerve deficit.  Skin: Skin is warm, dry and intact. No rash noted. No erythema. There is pallor.  Psychiatric: She has a normal mood and affect. Her speech is normal and behavior is normal. Her mood appears not anxious.    ED Course   Procedures (including critical care time)  1:43 Dr Hartford Poli will admit to observation, med bed, team 3  Results for orders placed during the hospital encounter of 01/09/11  CBC      Component Value Range   WBC 6.0  4.0 - 10.5 (K/uL)   RBC 2.53 (*) 3.87 -  5.11 (MIL/uL)   Hemoglobin 7.9 (*) 12.0 - 15.0 (g/dL)   HCT 25.9 (*) 36.0 - 46.0 (%)   MCV 102.4 (*) 78.0 - 100.0 (fL)   MCH 31.2  26.0 - 34.0 (pg)   MCHC 30.5  30.0 - 36.0 (g/dL)   RDW 16.0 (*) 11.5 - 15.5 (%)   Platelets 259  150 - 400 (K/uL)  COMPREHENSIVE METABOLIC PANEL      Component Value Range   Sodium 138  135 - 145 (mEq/L)   Potassium 4.1  3.5 - 5.1 (mEq/L)   Chloride 107  96 - 112 (mEq/L)   CO2 22  19 - 32 (mEq/L)   Glucose, Bld 118 (*) 70 - 99 (mg/dL)   BUN 21  6 - 23 (mg/dL)   Creatinine, Ser 1.11 (*) 0.50 - 1.10 (mg/dL)   Calcium 8.8  8.4 - 10.5 (mg/dL)   Total Protein 6.3  6.0 - 8.3 (g/dL)   Albumin 3.5  3.5 - 5.2 (g/dL)   AST 20  0 - 37 (U/L)   ALT 13  0 - 35 (U/L)   Alkaline Phosphatase 56  39 - 117 (U/L)   Total Bilirubin 0.1 (*) 0.3 - 1.2 (mg/dL)   GFR calc non Af Amer 46 (*) >90 (mL/min)   GFR calc Af Amer 53 (*) >90 (mL/min)   Laboratory interpretation anemia, renal insufficiency that is mild  Diagnoses that have been ruled out:  Diagnoses that are still under consideration:  Final diagnoses:  Anemia  Hypotension     Plan admission to observation  Rolland Porter, MD, FACEP      MDM          Janice Norrie, MD 01/09/11 1350

## 2011-01-09 NOTE — ED Notes (Deleted)
Pt reports low blood sugar this am, states it was 33. Took glucagon at home. States rechecked sugar at home and was normal but is afraid it will go down again.

## 2011-01-09 NOTE — ED Notes (Signed)
Pt with hx of anemia. Son states that went to docotors office last week and hemoglobin was 7, scheduled to have infusion tomorrow but states that pt is very weak. Spoke with dr Maurene Capes and was told to come into ED. Pt very lethargic.

## 2011-01-09 NOTE — ED Notes (Signed)
Family at bedside. 

## 2011-01-09 NOTE — H&P (Signed)
Hospital Admission Note Date: 01/09/2011  Patient name: Sophia [name]           Medical record number: [id number] Date of birth: [birth year]           Age: 77 y.o.   Gender: female    PCP:   Horatio Pel, MD, MD   Chief Complaint:  Anemia  HPI: Sophia [name] is a 77 y.o. female with past medical history of hemorrhagic gastritis and iron deficiency anemia. Patient is been getting transfusions on and off since February of this year. In February patient was admitted to the hospital with generalized weakness and anemia with hemoglobin of 5. At that time EGD/colonoscopy and the capsule endoscopy showed hemorrhagic gastritis and some small bowel AVMs. Patient was following with Dr. Maurene Capes as outpatient and she said she got 3 transfusions since then. Patient is scheduled for transfusion tomorrow, but she got weak to the point she came into the emergency department today. Patient evaluated in the emergency department her hemoglobin is 7.9 with MCV is 102.4. Transfusion ordered by the ED physician. Claimed there is no short stay bed so patient preferred to try the hospitalist for observation overnight till a transfusion finishes.  Past Medical History: Past Medical History  Diagnosis Date  . Congestive heart failure   . Aortic stenosis   . Hypertension   . Dyslipidemia   . Depression   . Duodenitis 03/15/10    per capsule endoscopy report  . Hemorrhagic gastritis 03/15/10    per capsule endoscopy report  . Diverticulosis 03/14/10  . Hyperplastic colon polyp   . Anemia   . DVT (deep venous thrombosis)   . Anxiety   . Arthritis   . Blood transfusion   . Cataract bil cateract removed  . GERD (gastroesophageal reflux disease)   . Heart murmur   . Hyperlipidemia   . Hiatal hernia   . Osteoporosis   . Neuropathy     bil big toes   Past Surgical History  Procedure Date  . Cholecystectomy   . Orif wrist fracture   . Fracture left knee 1993    x 2   . Foot surgery 1998  . Tubal  ligation   . Plantar fasciitis repair     left    Medications: Prior to Admission medications   Medication Sig Start Date End Date Taking? Authorizing Provider  ALPRAZolam (XANAX) 0.5 MG tablet 1/2- 1 tab by mouth three times a day as needed   Yes Historical Provider, MD  atorvastatin (LIPITOR) 40 MG tablet Take 40 mg by mouth. Take 1 tab by mouth every PM    Yes Historical Provider, MD  calcium citrate-vitamin D (CITRACAL+D) 315-200 MG-UNIT per tablet 1 tablet. Take 3 tablets by mouth once daily   Yes Historical Provider, MD  Cholecalciferol (VITAMIN D3) 1000 UNITS CAPS Take by mouth. Take 1 tablet by mouth once daily    Yes Historical Provider, MD  denosumab (PROLIA) 60 MG/ML SOLN Inject 60 mg into the skin once. One shot every 6 months.    Yes Historical Provider, MD  DEXILANT 60 MG capsule TAKE ONE CAPSULE BY MOUTH EVERY DAY 09/10/10  Yes Lafayette Dragon, MD  ferrous sulfate 325 (65 FE) MG tablet Take 1 tablet (325 mg total) by mouth 3 (three) times daily. 12/05/10  Yes Lafayette Dragon, MD  gabapentin (NEURONTIN) 400 MG tablet Take 400 mg by mouth 2 (two) times daily.    Yes Historical Provider, MD  HYDROcodone-acetaminophen Criselda Peaches)  5-500 MG per tablet Take 1/2-1 tablet by mouth at bedtime    Yes Historical Provider, MD  iron dextran complex (INFED) 50 MG/ML injection Iron infusion- Infed infustion over 4 hours of pharmacy calculated dose. No test dose needed. Weight-187 lbs Height - 5'. 09/04/10  Yes Lafayette Dragon, MD  lidocaine (LIDODERM) 5 % Place onto the skin. Use 1-3 patches topically every 12 hours    Yes Historical Provider, MD  moxifloxacin (VIGAMOX) 0.5 % ophthalmic solution Place 1 drop into the left eye 4 (four) times daily.     Yes Historical Provider, MD  prednisoLONE acetate (PRED FORTE) 1 % ophthalmic suspension Place 1 drop into the left eye 4 (four) times daily.     Yes Historical Provider, MD  verapamil (VERELAN PM) 240 MG 24 hr capsule Take 1 tablet by mouth daily   Yes  Historical Provider, MD  EPINEPHrine (EPIPEN 2-PAK IJ) Inject as directed. Use as directed for anaphylaxis    Historical Provider, MD    Allergies:   Allergies  Allergen Reactions  . Bee Anaphylaxis  . Lyrica Nausea And Vomiting  . [name]   . [name]     Social History:  reports that she has quit smoking. She has never used smokeless tobacco. She reports that she does not drink alcohol or use illicit drugs.  Family History: Family History  Problem Relation Age of Onset  . Uterine cancer Sister     Spread to colon     Review of Systems:  Constitutional: negative for anorexia, fevers and sweats Eyes: negative for irritation, redness and visual disturbance Ears, nose, mouth, throat, and face: negative for earaches, epistaxis, nasal congestion and sore throat Respiratory: negative for cough, dyspnea on exertion, sputum and wheezing Cardiovascular: negative for chest pain, dyspnea, lower extremity edema, orthopnea, palpitations and syncope Gastrointestinal: negative for abdominal pain, constipation, diarrhea, melena, nausea and vomiting Genitourinary:negative for dysuria, frequency and hematuria Hematologic/lymphatic: negative for bleeding, easy bruising and lymphadenopathy Musculoskeletal:negative for arthralgias, muscle weakness and stiff joints Neurological: negative for coordination problems, gait problems, headaches and weakness Endocrine: negative for diabetic symptoms including polydipsia, polyuria and weight loss Allergic/Immunologic: negative for anaphylaxis, hay fever and urticaria  Physical Exam: BP 114/42  Pulse 60  Temp(Src) 97.9 F (36.6 C) (Oral)  Resp 15  SpO2 99% General appearance: alert, cooperative and no distress  Head: Normocephalic, without obvious abnormality, atraumatic  Eyes: conjunctivae/corneas clear. PERRL, EOM's intact. Fundi benign.  Nose: Nares normal. Septum midline. Mucosa normal. No drainage or sinus tenderness.    Throat: lips, mucosa, and tongue normal; teeth and gums normal  Neck: Supple, no masses, no cervical lymphadenopathy, no JVD appreciated, no meningeal signs Resp: clear to auscultation bilaterally  Chest wall: no tenderness  Cardio: regular rate and rhythm, S1, S2 normal, no murmur, click, rub or gallop  GI: soft, non-tender; bowel sounds normal; no masses, no organomegaly  Extremities: extremities normal, atraumatic, no cyanosis or edema  Skin: Skin color, texture, turgor normal. No rashes or lesions  Neurologic: Alert and oriented X 3, normal strength and tone. Normal symmetric reflexes. Normal coordination and gait  Labs on Admission:   [facility] 01/09/11 1130  NA 138  K 4.1  CL 107  CO2 22  GLUCOSE 118*  BUN 21  CREATININE 1.11*  CALCIUM 8.8  MG --  PHOS --    Basename 01/09/11 1130  AST 20  ALT 13  ALKPHOS 56  BILITOT 0.1*  PROT 6.3  ALBUMIN 3.5  No results found for this basename: LIPASE:2,AMYLASE:2 in the last 72 hours  Basename 01/09/11 1130 01/09/11 1052  WBC 6.0 5.5  NEUTROABS -- --  HGB 7.9* 7.5*  HCT 25.9* 24.0*  MCV 102.4* 102.1*  PLT 259 239   Radiological Exams on Admission: No results found.   IMPRESSION: Present on Admission:  .ANEMIA, IRON DEFICIENCY NEC .GASTROESOPHAGEAL REFLUX DISEASE .Chronic gastritis .Generalized weakness  Assessment/Plan 1. Anemia: Patient has history of iron deficiency anemia. With iron level of 11 and ferritin level of 5. Since then patient had multiple iron infusions and blood transfusions. This time patient came in with generalized weakness and she is scheduled for transfusion tomorrow she can wait until tomorrow so she admitted to the hospital overnight for blood transfusion. Her MCV is 102.4 which is not consistent with iron deficiency on getting iron studies on her. The transfusion already ordered  2. Generalized weakness: This is likely secondary to the anemia patient does not have any symptoms or signs of  infections will get urinalysis and check her TSH.  3. History of chronic gastritis: We'll continue her PPI, explained again to avoid NSAIDs and bisphosphonates.  4. Hypertension: Controlled.  Kensli Bowley A 01/09/2011, 2:51 PM

## 2011-01-10 ENCOUNTER — Encounter (HOSPITAL_COMMUNITY): Payer: Medicare Other

## 2011-01-10 LAB — CBC
HCT: 20.8 % — ABNORMAL LOW (ref 36.0–46.0)
Hemoglobin: 6.4 g/dL — CL (ref 12.0–15.0)
MCH: 31.5 pg (ref 26.0–34.0)
MCH: 31.3 pg (ref 26.0–34.0)
MCHC: 32.4 g/dL (ref 30.0–36.0)
MCV: 102.5 fL — ABNORMAL HIGH (ref 78.0–100.0)
MCV: 96.7 fL (ref 78.0–100.0)
Platelets: 204 10*3/uL (ref 150–400)
Platelets: 210 10*3/uL (ref 150–400)
RBC: 2.03 MIL/uL — ABNORMAL LOW (ref 3.87–5.11)

## 2011-01-10 LAB — BASIC METABOLIC PANEL
CO2: 25 mEq/L (ref 19–32)
Calcium: 8 mg/dL — ABNORMAL LOW (ref 8.4–10.5)
Glucose, Bld: 88 mg/dL (ref 70–99)
Sodium: 144 mEq/L (ref 135–145)

## 2011-01-10 LAB — URINALYSIS, ROUTINE W REFLEX MICROSCOPIC
Ketones, ur: NEGATIVE mg/dL
Leukocytes, UA: NEGATIVE
Nitrite: NEGATIVE
Protein, ur: NEGATIVE mg/dL
Urobilinogen, UA: 0.2 mg/dL (ref 0.0–1.0)

## 2011-01-10 MED ORDER — GATIFLOXACIN 0.5 % OP SOLN
1.0000 [drp] | Freq: Four times a day (QID) | OPHTHALMIC | Status: DC
  Administered 2011-01-10 – 2011-01-11 (×4): 1 [drp] via OPHTHALMIC
  Filled 2011-01-10 (×2): qty 2.5

## 2011-01-10 NOTE — Progress Notes (Signed)
Patient signed informed consent prior to receiving.  Patient was given 2 units of blood today.  Unit [id number] was the first unit that was given at 0800 and was completed at 1100. Patient's vital signs are stable prior to, during, and after the transfusion.  No reaction was seen with the patient.  Unit [id number] was the second unit that was started at 1115 and ended at 1315, patient's vitals were stable before, during, and after transfusion.  No reaction was seen with the patient.

## 2011-01-10 NOTE — Progress Notes (Signed)
Physical Therapy Evaluation Patient Details Name: Sophia [name] MRN: [id number] DOB: May 23, 1928 Today's Date: 01/10/2011  Problem List:  Patient Active Problem List  Diagnoses  . HYPERLIPIDEMIA  . ANEMIA, IRON DEFICIENCY NEC  . ANXIETY DEPRESSION  . NEUROPATHY, IDIOPATHIC PERIPHERAL NEC  . HYPERTENSION, ESSENTIAL, CONTROLLED  . GASTROESOPHAGEAL REFLUX DISEASE  . OSTEOARTHRITIS  . DEGENERATIVE DISC DISEASE  . OSTEOPOROSIS  . SYMPTOM, INCONTINENCE, URINARY NOS  . Epigastric pain  . Chronic gastritis  . Generalized weakness  . Aortic stenosis    Past Medical History:  Past Medical History  Diagnosis Date  . Congestive heart failure   . Aortic stenosis   . Hypertension   . Dyslipidemia   . Depression   . Duodenitis 03/15/10    per capsule endoscopy report  . Hemorrhagic gastritis 03/15/10    per capsule endoscopy report  . Diverticulosis 03/14/10  . Hyperplastic colon polyp   . Anemia   . DVT (deep venous thrombosis)   . Anxiety   . Arthritis   . Blood transfusion   . Cataract bil cateract removed  . GERD (gastroesophageal reflux disease)   . Heart murmur   . Hyperlipidemia   . Hiatal hernia   . Osteoporosis   . Neuropathy     bil big toes   Past Surgical History:  Past Surgical History  Procedure Date  . Cholecystectomy   . Orif wrist fracture   . Fracture left knee 1993    x 2   . Foot surgery 1998  . Tubal ligation   . Plantar fasciitis repair     left    PT Assessment/Plan/Recommendation PT Assessment Clinical Impression Statement: 77 y.o. female admitted to [facility] with anemia s/p 2 units of PRBCs.  She presents with generalized weakness, decreased balance and mobility.  She would benefit from skilled PT in the acute care setting to maximize independence, functional mobility, safety so that she may return home with her daughter's assist and supervision safetly at discharge.  PT Recommendation/Assessment: Patient will need skilled PT in the acute care  venue PT Problem List: Decreased activity tolerance;Decreased balance;Decreased mobility PT Therapy Diagnosis : Difficulty walking;Abnormality of gait;Generalized weakness PT Plan PT Frequency: Min 3X/week PT Treatment/Interventions: DME instruction;Gait training;Stair training;Functional mobility training;Therapeutic activities;Therapeutic exercise;Balance training;Neuromuscular re-education;Patient/family education PT Recommendation Follow Up Recommendations: None Equipment Recommended: None recommended by PT PT Goals  Acute Rehab PT Goals PT Goal Formulation: With patient Time For Goal Achievement: 2 weeks Pt will go Supine/Side to Sit: with modified independence;with HOB 0 degrees PT Goal: Supine/Side to Sit - Progress: Progressing toward goal Pt will go Sit to Supine/Side: with modified independence;with HOB 0 degrees PT Goal: Sit to Supine/Side - Progress: Progressing toward goal Pt will go Sit to Stand: with modified independence PT Goal: Sit to Stand - Progress: Progressing toward goal Pt will go Stand to Sit: with modified independence PT Goal: Stand to Sit - Progress: Progressing toward goal Pt will Ambulate: >150 feet;with modified independence;with rolling walker PT Goal: Ambulate - Progress: Progressing toward goal Pt will Go Up / Down Stairs: 1-2 stairs;with min assist;with cane PT Goal: Up/Down Stairs - Progress: Not met  PT Evaluation Precautions/Restrictions    Prior Functioning  Home Living Lives With: Family;Daughter Receives Help From: Family (Daughter works days and is off Games developer, Wed.) Type of Home: Estate agent (daughter's house) Home Layout: One level Home Access: Stairs to enter Entrance Stairs-Rails: None Technical brewer of Steps: 2 Home Adaptive Equipment: Straight cane;Walker - rolling Prior Function  Level of Independence: Requires assistive device for independence;Other (comment) (assist needed on stairs) Able to Take Stairs?: Yes (with assist from  daughter) Driving: No Vocation: Retired Public librarian Arousal/Alertness: Awake/alert Overall Cognitive Status: Appears within functional limits for tasks assessed Orientation Level: Oriented X4 Sensation/Coordination   Extremity Assessment RLE Assessment RLE Assessment: Within Functional Limits LLE Assessment LLE Assessment: Within Functional Limits Mobility (including Balance) Bed Mobility Bed Mobility: Yes Supine to Sit: 6: Modified independent (Device/Increase time);HOB elevated (Comment degrees);With rails (HOB 45) Sitting - Scoot to Edge of Bed: 7: Independent Sit to Supine - Right: 6: Modified independent (Device/Increase time);With rail;HOB elevated (comment degrees) (HOB 45) Transfers Transfers: Yes Sit to Stand: 4: Min assist;With upper extremity assist;With armrests;From bed;From toilet Sit to Stand Details (indicate cue type and reason): min assist to boost trunk up over feet  Stand to Sit: 5: Supervision;To bed;To toilet;With armrests;With upper extremity assist Stand to Sit Details: supervision for safety Ambulation/Gait Ambulation/Gait: Yes Ambulation/Gait Assistance: 5: Supervision Ambulation/Gait Assistance Details (indicate cue type and reason): supervision for safety secondary to first walk while staying in hospital Ambulation Distance (Feet): 100 Feet Assistive device: Rolling walker Gait Pattern: Step-through pattern;Shuffle    Exercise    End of Session PT - End of Session Activity Tolerance: Patient tolerated treatment well;Patient limited by fatigue Patient left: in bed;with call bell in reach Nurse Communication:  (need for heat packs on feet-RN to attach per patient.) General Behavior During Session: [facility] for tasks performed Cognition: [facility] for tasks performed  Will Schier B. [place], [place], DPT [phone]  01/10/2011, 5:26 PM

## 2011-01-10 NOTE — Progress Notes (Signed)
PCP: Horatio Pel, MD, MD  Brief narrative: Sophia [name] is a 77 y.o. female with past medical history of hemorrhagic gastritis and iron deficiency anemia. Patient is been getting transfusions on and off since February of this year. In February patient was admitted to the hospital with generalized weakness and anemia with hemoglobin of 5. At that time EGD/colonoscopy and the capsule endoscopy showed hemorrhagic gastritis and some small bowel AVMs. Patient was following with Dr. Maurene Capes as outpatient and she said she got 3 transfusions since then. She presented to the ED, with the severe weakness. She was scheduled to undergo transfusions. This morning as an outpatient. However, she could not wait that long.  Consultants: None  Procedures: None   Subjective: Patient feels better after transfusion. Denies any bleeding anywhere. Denies any shortness of breath or chest pain.  Objective: Vital signs in last 24 hours: Temp:  [97.2 F (36.2 C)-99.1 F (37.3 C)] 98.1 F (36.7 C) (12/05 1437) Pulse Rate:  [69-84] 76  (12/05 1437) Resp:  [17-19] 18  (12/05 1437) BP: (121-157)/(51-79) 134/51 mmHg (12/05 1437) SpO2:  [93 %-99 %] 97 % (12/05 1437) Weight change:  Last BM Date: 01/08/11  Intake/Output from previous day: 12/04 0701 - 12/05 0700 In: -  Out: 1050 [Urine:1050] Intake/Output this shift: Total I/O In: 360 [P.O.:360] Out: -   General appearance: alert, cooperative, appears stated age and no distress Head: Normocephalic, without obvious abnormality, atraumatic Resp: clear to auscultation bilaterally Cardio: regular rate and rhythm, systolic murmur: holosystolic 4/6, blowing at 2nd left intercostal space, no click and no rub GI: soft, non-tender; bowel sounds normal; no masses,  no organomegaly Extremities: extremities normal, atraumatic, no cyanosis or edema Pulses: 2+ and symmetric Neurologic: Alert and oriented X 3, normal strength and tone. Normal symmetric  reflexes. Normal coordination and gait  Lab Results:  Basename 01/10/11 0550 01/09/11 1130  WBC 3.1* 6.0  HGB 6.4* 7.9*  HCT 20.8* 25.9*  PLT 204 259   BMET  Basename 01/10/11 0550 01/09/11 1130  NA 144 138  K 3.8 4.1  CL 114* 107  CO2 25 22  GLUCOSE 88 118*  BUN 14 21  CREATININE 0.86 1.11*  CALCIUM 8.0* 8.8    Studies/Results: No results found.  Medications:  Scheduled:    . sodium chloride   Intravenous STAT  . ferrous sulfate  325 mg Oral TID  . gabapentin  400 mg Oral BID  . gatifloxacin  1 drop Left Eye QID  . lidocaine  1 patch Transdermal Q24H  . pantoprazole  40 mg Oral Q1200  . prednisoLONE acetate  1 drop Left Eye QID  . rosuvastatin  5 mg Oral q1800  . verapamil  240 mg Oral Daily  . DISCONTD: simvastatin  20 mg Oral Daily  . DISCONTD: verapamil  240 mg Oral Daily    Principal Problem:  *ANEMIA, IRON DEFICIENCY NEC Active Problems:  GASTROESOPHAGEAL REFLUX DISEASE  Chronic gastritis  Generalized weakness   Assessment/Plan: 1. Anemia: Patient has history of iron deficiency anemia. With iron level of 11 and ferritin level of 5. Since earlier this year patient has had multiple iron infusions and blood transfusions. Patient feels better posttransfusion. Will check a CBC post transfusion, as well as one in the morning.  2. Generalized weakness: This is likely secondary to the anemia. UA and TSH were normal. PT/OT will be asked to evaluate.  3. History of chronic gastritis: We'll continue her PPI, explained again to avoid NSAIDs and bisphosphonates.  4. Hypertension: Controlled.  His hemoglobin stays stable patient will likely be discharged in the morning.   LOS: 1 day   [facility] Pager [phone] 01/10/2011, 4:42 PM

## 2011-01-10 NOTE — Progress Notes (Signed)
01/10/2011 [facility] [employer] Case Management Note [id number]        CARE MANAGEMENT NOTE 01/10/2011  Patient:  Sophia [name], Sophia [name]   Account Number:  [id number]  Date Initiated:  01/10/2011  Documentation initiated by:  Kellie Moor  Subjective/Objective Assessment:   admitted on 01/09/11 with c/o weakness and anemia     Action/Plan:   Prior to admission, patient lived at home with family support. Fairly independent with ADLs   Anticipated DC Date:  01/11/2011   Anticipated DC Plan:  [place]  CM consult      Choice offered to / List presented to:             Status of service:  In process, will continue to follow Medicare Important Message given?   (If response is "NO", the following Medicare IM given date fields will be blank) Date Medicare IM given:   Date Additional Medicare IM given:    Discharge Disposition:    Per UR Regulation:  Reviewed for med. necessity/level of care/duration of stay  Comments:  01/10/11-1030- J.Bertram Savin  [id number]      77yo female admitted on 01/09/11 with c/o weakness and anemia. Prior to admission, patient lived at home with family support. Reports being fairly independent with ADLs. Anticipated discharge plan will be to discharge home to self care. No discharge needs noted at present.CM will continue to follow.

## 2011-01-11 ENCOUNTER — Telehealth: Payer: Self-pay | Admitting: Internal Medicine

## 2011-01-11 LAB — CBC
MCHC: 32.9 g/dL (ref 30.0–36.0)
MCV: 97 fL (ref 78.0–100.0)
Platelets: 237 10*3/uL (ref 150–400)
RDW: 18.2 % — ABNORMAL HIGH (ref 11.5–15.5)
WBC: 4.6 10*3/uL (ref 4.0–10.5)

## 2011-01-11 LAB — TYPE AND SCREEN: Unit division: 0

## 2011-01-11 LAB — URINE CULTURE
Colony Count: NO GROWTH
Culture  Setup Time: 201212051111
Culture: NO GROWTH

## 2011-01-11 MED ORDER — GABAPENTIN 400 MG PO CAPS
400.0000 mg | ORAL_CAPSULE | Freq: Two times a day (BID) | ORAL | Status: DC
  Administered 2011-01-11: 400 mg via ORAL

## 2011-01-11 NOTE — Progress Notes (Signed)
PT Cancellation Note    Treatment cancelled today due to patient's refusal to participate. Patient states pending discharge and wishes to conserve her energy  01/11/2011  Jake Bathe, PT  Pager [phone]

## 2011-01-11 NOTE — Telephone Encounter (Signed)
Patient is about to be discharged from the hospital after receiving 2 units.  He is advised to have her follow up with Dr Olevia Perches based on the discharge instructions.

## 2011-01-11 NOTE — Discharge Summary (Signed)
Physician Discharge Summary  Patient ID: Sophia [name] MRN: [id number] DOB/AGE: [birth year] 77 y.o.  Admit date: 01/09/2011 Discharge date: 01/11/2011  Discharge Diagnoses:  Principal Problem:  *ANEMIA, IRON DEFICIENCY NEC Active Problems:  GASTROESOPHAGEAL REFLUX DISEASE  Chronic gastritis  Discharged Condition: fair  Initial History: Sophia [name] is a 77 y.o. female with past medical history of hemorrhagic gastritis and iron deficiency anemia. Patient is been getting transfusions on and off since February of this year. In February patient was admitted to the hospital with generalized weakness and anemia with hemoglobin of 5. At that time EGD/colonoscopy and the capsule endoscopy showed hemorrhagic gastritis and some small bowel AVMs. Patient was following with Dr. Maurene Capes as outpatient and she said she got 3 transfusions since then. She presented to the ED, with the severe weakness. She was scheduled to undergo transfusions this morning as an outpatient. However, she could not wait that long and decided to come in to the hospital.   Hospital Course:  1. Anemia: Patient has history of iron deficiency anemia. Patient was admitted to the hospital and underwent the blood transfusions. She got 2 units. Her weakness resolved after she received the blood transfusions. The reason for her anemia is thought to be chronic GI blood loss. She follows with Dr. Olevia Perches, as an outpatient. Her posttransfusion CBC reveals a hemoglobin of 9.4.  2. Generalized weakness: This is likely secondary to the anemia. And this resolved with transfusion. UA and TSH were normal.   3. History of chronic gastritis: We'll continue her PPI, explained again to avoid NSAIDs and bisphosphonates.   4. Hypertension: Controlled. Continue current medications.  We also had the patient evaluated by physical therapy and they did not feel that the patient needed any home health.   PERTINENT LABS  Hemoglobin yesterday  morning was 6.4. Post transfusion, it came up to 9.4. Renal function was normal. Electrolytes were normal. B12 level was normal as well.  IMAGING STUDIES No results found.  Discharge Exam: Blood pressure 164/73, pulse 90, temperature 97.9 F (36.6 C), temperature source Oral, resp. rate 20, SpO2 96.00%. General appearance: alert, cooperative and no distress Head: Normocephalic, without obvious abnormality, atraumatic Eyes: conjunctivae/corneas clear. PERRL, EOM's intact. Fundi benign. Throat: lips, mucosa, and tongue normal; teeth and gums normal Resp: clear to auscultation bilaterally Cardio: regular rate and rhythm, systolic murmur: holosystolic 4/6, blowing at 2nd right intercostal space, no click and no rub GI: soft, non-tender; bowel sounds normal; no masses,  no organomegaly Extremities: extremities normal, atraumatic, no cyanosis or edema Pulses: 2+ and symmetric Skin: Skin color, texture, turgor normal. No rashes or lesions Neurologic: Grossly normal  Disposition: Home or Self Care  Discharge Orders    Future Orders Please Complete By Expires   Diet - low sodium heart healthy      Increase activity slowly      Discharge instructions      Comments:   Follow up with Dr. Olevia Perches as soon as possibe     Current Discharge Medication List    CONTINUE these medications which have NOT CHANGED   Details  ALPRAZolam (XANAX) 0.5 MG tablet 1/2- 1 tab by mouth three times a day as needed    atorvastatin (LIPITOR) 40 MG tablet Take 40 mg by mouth. Take 1 tab by mouth every PM     calcium citrate-vitamin D (CITRACAL+D) 315-200 MG-UNIT per tablet 1 tablet. Take 3 tablets by mouth once daily    Cholecalciferol (VITAMIN D3) 1000 UNITS CAPS Take  by mouth. Take 1 tablet by mouth once daily     denosumab (PROLIA) 60 MG/ML SOLN Inject 60 mg into the skin once. One shot every 6 months.     DEXILANT 60 MG capsule TAKE ONE CAPSULE BY MOUTH EVERY DAY Qty: 30 capsule, Refills: 2    ferrous  sulfate 325 (65 FE) MG tablet Take 1 tablet (325 mg total) by mouth 3 (three) times daily. Qty: 90 tablet, Refills: 2    gabapentin (NEURONTIN) 400 MG tablet Take 400 mg by mouth 2 (two) times daily.     HYDROcodone-acetaminophen (VICODIN) 5-500 MG per tablet Take 1/2-1 tablet by mouth at bedtime     iron dextran complex (INFED) 50 MG/ML injection Iron infusion- Infed infustion over 4 hours of pharmacy calculated dose. No test dose needed. Weight-187 lbs Height - 5'. Qty: 1 mL, Refills: 0    lidocaine (LIDODERM) 5 % Place onto the skin. Use 1-3 patches topically every 12 hours     moxifloxacin (VIGAMOX) 0.5 % ophthalmic solution Place 1 drop into the left eye 4 (four) times daily.     prednisoLONE acetate (PRED FORTE) 1 % ophthalmic suspension Place 1 drop into the left eye 4 (four) times daily.      verapamil (VERELAN PM) 240 MG 24 hr capsule Take 1 tablet by mouth daily    EPINEPHrine (EPIPEN 2-PAK IJ) Inject as directed. Use as directed for anaphylaxis       Follow-up Information    Follow up with Horatio Pel, MD in 2 weeks.      Follow up with [name], MD in 5 days.   Contact information:   [address] [facility] [place]          Total Discharge Time: 35 mins  Signed: [facility] Pager [phone]  01/11/2011, 11:22 AM

## 2011-02-07 ENCOUNTER — Telehealth: Payer: Self-pay | Admitting: Internal Medicine

## 2011-02-07 NOTE — Telephone Encounter (Signed)
error 

## 2011-02-07 NOTE — Telephone Encounter (Signed)
Had a detailed conversation with Sophia [name] regarding a letter that he brought by from [employer] stating that they would no longer cover [place]. I advised him that I never received this paperwork, however, I am more than happy to help him. He did give me the phone number of the insurance company [phone]) to contact regarding the medication. I did advise Sophia [name] that it does not appear his mother has been given any refills of Dexilant in quite sometime so I wonder if she is still taking it. He states that she actually gets the medication from Dr Olevia Perches and Dr Shelia Media. I have advised him that she needs to get her Dexilant from either [place] OR Dr Shelia Media, not both, as it makes it difficult to gauge compliance of medications etc. He verbalizes understanding. He states that he also took a letter to Dr Pennie Banter office regarding the [place]. I have asked him to contact Dr Pennie Banter office first to see if they have already completed the necessary paperwork etc. I have also advised him that his mother needs an office visit as per Discharge instructions from the hospital. He states he was never told this (although Sophia Merino, RN note states that he was advised of this). I attempted to schedule an office visit but he states that he has to coordinate between several doctors so he will have to call me back. I will await his phone call.

## 2011-02-08 DIAGNOSIS — N289 Disorder of kidney and ureter, unspecified: Secondary | ICD-10-CM | POA: Diagnosis not present

## 2011-02-08 DIAGNOSIS — D649 Anemia, unspecified: Secondary | ICD-10-CM | POA: Diagnosis not present

## 2011-02-15 ENCOUNTER — Telehealth: Payer: Self-pay | Admitting: Internal Medicine

## 2011-02-15 NOTE — Telephone Encounter (Signed)
Spoke with patient's son and told him we have not received any labs on his mother since 01/11/12.[name] He will have labs faxed to [place] from [place], [place]. Patient is scheduled on 03/09/11 for OV here.

## 2011-02-16 ENCOUNTER — Telehealth: Payer: Self-pay | Admitting: Internal Medicine

## 2011-02-16 NOTE — Telephone Encounter (Signed)
Spoke with patient's son [name] and told him we have received the fax with her labs and it is on Dr. Nichola Sizer desk for review. Her hgb was 10.2.

## 2011-02-19 ENCOUNTER — Telehealth: Payer: Self-pay | Admitting: Internal Medicine

## 2011-02-19 NOTE — Telephone Encounter (Signed)
Left message for Sophia [name] to call us back.

## 2011-02-19 NOTE — Telephone Encounter (Signed)
I have spoken to Mr Sophia [name] once more regarding authorization for [name]. He still is unaware as to whether Dr Pennie Banter office is persuing authorization of [name]. I have asked that he find out if they are and if not, we will be glad to get authorization. He verbalizes understanding. He states that his mother has tried Protonix but he will ask her if there are any other medications she has tried and will let us know.

## 2011-02-19 NOTE — Telephone Encounter (Signed)
Spoke with Olivia Mackie and gave her dx code: 285.9 for CBC/diff on 02/08/11.

## 2011-02-22 ENCOUNTER — Encounter: Payer: Self-pay | Admitting: Internal Medicine

## 2011-02-27 ENCOUNTER — Encounter: Payer: Self-pay | Admitting: *Deleted

## 2011-03-07 ENCOUNTER — Telehealth: Payer: Self-pay | Admitting: Internal Medicine

## 2011-03-07 DIAGNOSIS — D509 Iron deficiency anemia, unspecified: Secondary | ICD-10-CM

## 2011-03-07 DIAGNOSIS — E538 Deficiency of other specified B group vitamins: Secondary | ICD-10-CM

## 2011-03-07 DIAGNOSIS — D649 Anemia, unspecified: Secondary | ICD-10-CM

## 2011-03-07 NOTE — Telephone Encounter (Signed)
Please do CBC,C-met, Iron studies and B12

## 2011-03-07 NOTE — Telephone Encounter (Signed)
Patient's family calling. She is due for OV on 03/09/11. Wants to know if she can have labs done before OV. Please, advise.

## 2011-03-07 NOTE — Telephone Encounter (Signed)
Spoke with patient's son. Patient will come tomorrow for labs prior to OV on 03/09/11. Labs in [facility]

## 2011-03-08 ENCOUNTER — Other Ambulatory Visit (INDEPENDENT_AMBULATORY_CARE_PROVIDER_SITE_OTHER): Payer: Medicare Other

## 2011-03-08 DIAGNOSIS — M6740 Ganglion, unspecified site: Secondary | ICD-10-CM | POA: Diagnosis not present

## 2011-03-08 DIAGNOSIS — D649 Anemia, unspecified: Secondary | ICD-10-CM | POA: Diagnosis not present

## 2011-03-08 DIAGNOSIS — E538 Deficiency of other specified B group vitamins: Secondary | ICD-10-CM

## 2011-03-08 DIAGNOSIS — D509 Iron deficiency anemia, unspecified: Secondary | ICD-10-CM | POA: Diagnosis not present

## 2011-03-08 DIAGNOSIS — F329 Major depressive disorder, single episode, unspecified: Secondary | ICD-10-CM | POA: Diagnosis not present

## 2011-03-08 LAB — IBC PANEL
Iron: 48 ug/dL (ref 42–145)
Transferrin: 246.2 mg/dL (ref 212.0–360.0)

## 2011-03-08 LAB — CBC WITH DIFFERENTIAL/PLATELET
Basophils Absolute: 0 10*3/uL (ref 0.0–0.1)
Eosinophils Absolute: 0.1 10*3/uL (ref 0.0–0.7)
Hemoglobin: 10.2 g/dL — ABNORMAL LOW (ref 12.0–15.0)
Lymphocytes Relative: 13.6 % (ref 12.0–46.0)
Monocytes Relative: 7.7 % (ref 3.0–12.0)
Neutro Abs: 3.7 10*3/uL (ref 1.4–7.7)
Neutrophils Relative %: 76.3 % (ref 43.0–77.0)
RBC: 3.17 Mil/uL — ABNORMAL LOW (ref 3.87–5.11)
RDW: 13.2 % (ref 11.5–14.6)

## 2011-03-08 LAB — COMPREHENSIVE METABOLIC PANEL
AST: 17 U/L (ref 0–37)
Alkaline Phosphatase: 52 U/L (ref 39–117)
BUN: 29 mg/dL — ABNORMAL HIGH (ref 6–23)
Creatinine, Ser: 1.2 mg/dL (ref 0.4–1.2)

## 2011-03-09 ENCOUNTER — Encounter: Payer: Self-pay | Admitting: Internal Medicine

## 2011-03-09 ENCOUNTER — Ambulatory Visit (INDEPENDENT_AMBULATORY_CARE_PROVIDER_SITE_OTHER): Payer: Medicare Other | Admitting: Internal Medicine

## 2011-03-09 ENCOUNTER — Telehealth: Payer: Self-pay | Admitting: *Deleted

## 2011-03-09 VITALS — BP 102/60 | HR 76 | Ht 62.0 in | Wt 183.0 lb

## 2011-03-09 DIAGNOSIS — K2961 Other gastritis with bleeding: Secondary | ICD-10-CM

## 2011-03-09 DIAGNOSIS — R195 Other fecal abnormalities: Secondary | ICD-10-CM | POA: Diagnosis not present

## 2011-03-09 NOTE — Progress Notes (Signed)
Sophia [name] 1928-11-28 MRN [id number]        History of Present Illness:  This is a 78 year old white female with chronic GI blood loss and chronic anemia due to  iron deficiency despite of oral iron supplements. Last iron infusion March 2012. She has a history of aortic stenosis and congestive heart failure. Her hemoglobin yesterday was 10.2 which is the same as 8 weeks ago. Her iron saturation is still low at 13.9%. Last upper endoscopy and colonoscopy in March 2012 showed hemorrhagic gastritis question of watermelon stomach. She has been off Coumadin. She received blood transfusions in December 2012 for hemoglobin of 6.4. She denies any abdominal pain   Past Medical History  Diagnosis Date  . Congestive heart failure   . Aortic stenosis   . Hypertension   . Dyslipidemia   . Depression   . Duodenitis 03/15/10    per capsule endoscopy report  . Hemorrhagic gastritis 03/15/10    per capsule endoscopy report  . Diverticulosis 03/14/10  . Hyperplastic colon polyp   . Anemia   . DVT (deep venous thrombosis)   . Anxiety   . Arthritis   . Blood transfusion   . Cataract bil cateract removed  . GERD (gastroesophageal reflux disease)   . Heart murmur   . Hyperlipidemia   . Hiatal hernia   . Osteoporosis   . Neuropathy     bil big toes   Past Surgical History  Procedure Date  . Cholecystectomy   . Orif wrist fracture   . Fracture left knee 1993    x 2   . Foot surgery 1998  . Tubal ligation   . Plantar fasciitis repair     left  . Eye surgery     reports that she quit smoking about 22 years ago. She has never used smokeless tobacco. She reports that she does not drink alcohol or use illicit drugs. family history includes Uterine cancer in her sister. Allergies  Allergen Reactions  . Bee Anaphylaxis  . Lyrica Nausea And Vomiting  . [name]   . [name]         Review of Systems: Denies abdominal pain, dyspepsia, dysphagia  The remainder  of the 10 point ROS is negative except as outlined in H&P   Physical Exam: General appearance  Well developed, in no distress. Eyes- non icteric. HEENT nontraumatic, normocephalic. Mouth no lesions, tongue papillated, no cheilosis. Neck supple without adenopathy, thyroid not enlarged, no carotid bruits, no JVD. Lungs Clear to auscultation bilaterally. Cor normal S1, normal S2, regular rhythm, no murmur,  quiet precordium. Abdomen: Soft nontender abdomen with normal active bowel sounds  Rectal: Dark Hemoccult positive stool Extremities no pedal edema. Skin no lesions. Neurological alert and oriented x 3. Psychological normal mood and affect.  Assessment and Plan:  Chronic GI blood loss from  chronic gastritis. On iron supplements. We will recheck blood count in 4 weeks. She will likely need iron infusion in near future but wants to wait because she is currently living with her daughter in [place]. We may be able to arrange for outpatient iron transfusion there in [facility] pending results of the CBC from April 06, 2011   03/09/2011 [name]

## 2011-03-09 NOTE — Patient Instructions (Signed)
Dr Shelia Media

## 2011-03-09 NOTE — Telephone Encounter (Signed)
Message copied by Hulan Saas on Fri Mar 09, 2011 11:46 AM ------      Message from: Lafayette Dragon      Created: Thu Mar 08, 2011  5:46 PM       Please call pt with  Improved Hgb up to

## 2011-03-09 NOTE — Telephone Encounter (Signed)
Spoke with patient and gave her labs results as per Dr. Olevia Perches.

## 2011-04-05 DIAGNOSIS — D649 Anemia, unspecified: Secondary | ICD-10-CM | POA: Diagnosis not present

## 2011-04-10 ENCOUNTER — Telehealth: Payer: Self-pay | Admitting: *Deleted

## 2011-04-10 DIAGNOSIS — D649 Anemia, unspecified: Secondary | ICD-10-CM

## 2011-04-10 NOTE — Telephone Encounter (Signed)
She ought to have her iron checked with next CBC

## 2011-04-10 NOTE — Telephone Encounter (Signed)
Gave patient's son results as per Dr. Olevia Perches. He is wondering if she should have her iron level checked. Please, advise.

## 2011-04-10 NOTE — Telephone Encounter (Signed)
Received a call from son re: 04/05/11 labs. [employer] for results.

## 2011-04-10 NOTE — Telephone Encounter (Signed)
Addended by: Hulan Saas on: 04/10/2011 02:52 PM   Modules accepted: Orders

## 2011-04-13 ENCOUNTER — Encounter: Payer: Self-pay | Admitting: Internal Medicine

## 2011-04-26 DIAGNOSIS — M545 Low back pain: Secondary | ICD-10-CM | POA: Diagnosis not present

## 2011-04-26 DIAGNOSIS — M5430 Sciatica, unspecified side: Secondary | ICD-10-CM | POA: Diagnosis not present

## 2011-04-26 DIAGNOSIS — M5126 Other intervertebral disc displacement, lumbar region: Secondary | ICD-10-CM | POA: Diagnosis not present

## 2011-04-29 DIAGNOSIS — M5126 Other intervertebral disc displacement, lumbar region: Secondary | ICD-10-CM | POA: Diagnosis not present

## 2011-04-29 DIAGNOSIS — M5430 Sciatica, unspecified side: Secondary | ICD-10-CM | POA: Diagnosis not present

## 2011-04-29 DIAGNOSIS — M545 Low back pain: Secondary | ICD-10-CM | POA: Diagnosis not present

## 2011-05-03 ENCOUNTER — Other Ambulatory Visit (INDEPENDENT_AMBULATORY_CARE_PROVIDER_SITE_OTHER): Payer: Medicare Other

## 2011-05-03 DIAGNOSIS — D649 Anemia, unspecified: Secondary | ICD-10-CM

## 2011-05-03 DIAGNOSIS — D235 Other benign neoplasm of skin of trunk: Secondary | ICD-10-CM | POA: Diagnosis not present

## 2011-05-03 DIAGNOSIS — B351 Tinea unguium: Secondary | ICD-10-CM | POA: Diagnosis not present

## 2011-05-03 DIAGNOSIS — B079 Viral wart, unspecified: Secondary | ICD-10-CM | POA: Diagnosis not present

## 2011-05-03 LAB — CBC WITH DIFFERENTIAL/PLATELET
Basophils Relative: 0.4 % (ref 0.0–3.0)
Eosinophils Absolute: 0.1 10*3/uL (ref 0.0–0.7)
Eosinophils Relative: 2.2 % (ref 0.0–5.0)
Lymphocytes Relative: 15.7 % (ref 12.0–46.0)
MCHC: 33 g/dL (ref 30.0–36.0)
Neutrophils Relative %: 72.7 % (ref 43.0–77.0)
Platelets: 273 10*3/uL (ref 150.0–400.0)
RBC: 3.27 Mil/uL — ABNORMAL LOW (ref 3.87–5.11)
WBC: 5.1 10*3/uL (ref 4.5–10.5)

## 2011-05-03 LAB — IBC PANEL
Iron: 121 ug/dL (ref 42–145)
Saturation Ratios: 31.7 % (ref 20.0–50.0)
Transferrin: 272.3 mg/dL (ref 212.0–360.0)

## 2011-05-04 DIAGNOSIS — H43399 Other vitreous opacities, unspecified eye: Secondary | ICD-10-CM | POA: Diagnosis not present

## 2011-05-04 DIAGNOSIS — Z961 Presence of intraocular lens: Secondary | ICD-10-CM | POA: Diagnosis not present

## 2011-05-04 DIAGNOSIS — H524 Presbyopia: Secondary | ICD-10-CM | POA: Diagnosis not present

## 2011-05-07 ENCOUNTER — Telehealth: Payer: Self-pay | Admitting: *Deleted

## 2011-05-07 NOTE — Telephone Encounter (Signed)
Message copied by Larina Bras on Mon May 07, 2011  9:15 AM ------      Message from: Lafayette Dragon      Created: Thu May 03, 2011 10:28 PM       Please call pt with stable H/H, unchanged from 1/31//2013, repeat 2 months

## 2011-05-07 NOTE — Telephone Encounter (Signed)
I have spoken to Mrs. Palomera son (her emergency contact). I have advised him that per Dr Olevia Perches, patient's hemoglobin and hematocrit were stable. He questions whether the IBC was completed. I did advise him that this has been completed and those levels were normal (actually increased significantly since 03/08/11). I have asked that patient had CBC completed again in 2 months. He verbalizes understanding and states that [facility] already has standing orders to complete labs.

## 2011-05-17 DIAGNOSIS — M25569 Pain in unspecified knee: Secondary | ICD-10-CM | POA: Diagnosis not present

## 2011-05-17 DIAGNOSIS — M5430 Sciatica, unspecified side: Secondary | ICD-10-CM | POA: Diagnosis not present

## 2011-05-17 DIAGNOSIS — M5020 Other cervical disc displacement, unspecified cervical region: Secondary | ICD-10-CM | POA: Diagnosis not present

## 2011-06-14 DIAGNOSIS — M549 Dorsalgia, unspecified: Secondary | ICD-10-CM | POA: Diagnosis not present

## 2011-06-14 DIAGNOSIS — M545 Low back pain: Secondary | ICD-10-CM | POA: Diagnosis not present

## 2011-06-14 DIAGNOSIS — M5430 Sciatica, unspecified side: Secondary | ICD-10-CM | POA: Diagnosis not present

## 2011-06-20 DIAGNOSIS — I080 Rheumatic disorders of both mitral and aortic valves: Secondary | ICD-10-CM | POA: Diagnosis not present

## 2011-06-20 DIAGNOSIS — J209 Acute bronchitis, unspecified: Secondary | ICD-10-CM | POA: Diagnosis not present

## 2011-06-27 DIAGNOSIS — M810 Age-related osteoporosis without current pathological fracture: Secondary | ICD-10-CM | POA: Diagnosis not present

## 2011-06-27 DIAGNOSIS — D509 Iron deficiency anemia, unspecified: Secondary | ICD-10-CM | POA: Diagnosis not present

## 2011-06-27 DIAGNOSIS — J209 Acute bronchitis, unspecified: Secondary | ICD-10-CM | POA: Diagnosis not present

## 2011-06-29 ENCOUNTER — Telehealth: Payer: Self-pay | Admitting: Internal Medicine

## 2011-06-29 DIAGNOSIS — D649 Anemia, unspecified: Secondary | ICD-10-CM

## 2011-06-29 NOTE — Telephone Encounter (Signed)
Patient's son calling to make sure we received labs on his mother. Labs on are MD desk for review.

## 2011-07-03 NOTE — Telephone Encounter (Signed)
I have spoken to patient's son to advise him that Dr Olevia Perches has looked over his mother's labs and says they are stable at this time. She would like for patient to have repeat labwork in 8 weeks. Son verbalizes understanding.

## 2011-07-04 DIAGNOSIS — M810 Age-related osteoporosis without current pathological fracture: Secondary | ICD-10-CM | POA: Diagnosis not present

## 2011-07-04 DIAGNOSIS — G250 Essential tremor: Secondary | ICD-10-CM | POA: Diagnosis not present

## 2011-07-04 DIAGNOSIS — G252 Other specified forms of tremor: Secondary | ICD-10-CM | POA: Diagnosis not present

## 2011-07-05 ENCOUNTER — Encounter: Payer: Self-pay | Admitting: Internal Medicine

## 2011-07-12 DIAGNOSIS — M549 Dorsalgia, unspecified: Secondary | ICD-10-CM | POA: Diagnosis not present

## 2011-07-12 DIAGNOSIS — M25569 Pain in unspecified knee: Secondary | ICD-10-CM | POA: Diagnosis not present

## 2011-07-12 DIAGNOSIS — M545 Low back pain: Secondary | ICD-10-CM | POA: Diagnosis not present

## 2011-07-12 DIAGNOSIS — M5430 Sciatica, unspecified side: Secondary | ICD-10-CM | POA: Diagnosis not present

## 2011-08-01 DIAGNOSIS — Z961 Presence of intraocular lens: Secondary | ICD-10-CM | POA: Diagnosis not present

## 2011-08-01 DIAGNOSIS — Z947 Corneal transplant status: Secondary | ICD-10-CM | POA: Diagnosis not present

## 2011-08-02 ENCOUNTER — Telehealth: Payer: Self-pay | Admitting: Internal Medicine

## 2011-08-02 DIAGNOSIS — K625 Hemorrhage of anus and rectum: Secondary | ICD-10-CM

## 2011-08-02 NOTE — Telephone Encounter (Signed)
Line busy - will try again later

## 2011-08-03 NOTE — Telephone Encounter (Signed)
Spoke with patient.

## 2011-08-03 NOTE — Addendum Note (Signed)
Addended by: Hulan Saas on: 08/03/2011 10:59 AM   Modules accepted: Orders

## 2011-08-03 NOTE — Telephone Encounter (Signed)
Please check CBC and Iron studies on Mon July 1,2013 ----- Message ----- From: Hulan Saas, RN Sent: 08/03/2011 9:17 AM To: Lafayette Dragon, MD

## 2011-08-03 NOTE — Telephone Encounter (Signed)
Patient will come Monday for labs. Lab in [facility]

## 2011-08-03 NOTE — Telephone Encounter (Signed)
Spoke with patient and she states she is back in town now. States she thought she had a UTI and her daughter gave her some antibiotic pills she had(she thinks it was sulfa). She took them for 5 days. Her stools became mushy and tar like. States her stools were like this on 6/23, 24, 25, 26. She did not have a stool yesterday. She is asking to have labs done to check her CBC. States she does not have a ride to get here today but could come Monday. Please, advise.

## 2011-08-06 ENCOUNTER — Other Ambulatory Visit (INDEPENDENT_AMBULATORY_CARE_PROVIDER_SITE_OTHER): Payer: Medicare Other

## 2011-08-06 DIAGNOSIS — K625 Hemorrhage of anus and rectum: Secondary | ICD-10-CM | POA: Diagnosis not present

## 2011-08-06 LAB — CBC WITH DIFFERENTIAL/PLATELET
Basophils Relative: 0.6 % (ref 0.0–3.0)
Eosinophils Absolute: 0 10*3/uL (ref 0.0–0.7)
HCT: 27 % — ABNORMAL LOW (ref 36.0–46.0)
Hemoglobin: 9 g/dL — ABNORMAL LOW (ref 12.0–15.0)
Lymphocytes Relative: 14.2 % (ref 12.0–46.0)
Lymphs Abs: 0.5 10*3/uL — ABNORMAL LOW (ref 0.7–4.0)
MCHC: 33.3 g/dL (ref 30.0–36.0)
MCV: 95.1 fl (ref 78.0–100.0)
Neutro Abs: 2.9 10*3/uL (ref 1.4–7.7)
RBC: 2.84 Mil/uL — ABNORMAL LOW (ref 3.87–5.11)
RDW: 13.7 % (ref 11.5–14.6)

## 2011-08-06 LAB — IBC PANEL: Saturation Ratios: 9.9 % — ABNORMAL LOW (ref 20.0–50.0)

## 2011-08-07 ENCOUNTER — Other Ambulatory Visit: Payer: Self-pay | Admitting: *Deleted

## 2011-08-10 DIAGNOSIS — N390 Urinary tract infection, site not specified: Secondary | ICD-10-CM | POA: Diagnosis not present

## 2011-08-10 DIAGNOSIS — R300 Dysuria: Secondary | ICD-10-CM | POA: Diagnosis not present

## 2011-08-15 ENCOUNTER — Encounter (HOSPITAL_COMMUNITY)
Admission: RE | Admit: 2011-08-15 | Discharge: 2011-08-15 | Disposition: A | Discharge status: Home / Self Care | Payer: Medicare Other | Source: Ambulatory Visit | Attending: Internal Medicine | Admitting: Internal Medicine

## 2011-08-15 ENCOUNTER — Encounter (HOSPITAL_COMMUNITY): Payer: Self-pay

## 2011-08-15 DIAGNOSIS — D509 Iron deficiency anemia, unspecified: Secondary | ICD-10-CM | POA: Diagnosis not present

## 2011-08-15 MED ORDER — SODIUM CHLORIDE 0.9 % IV SOLN
1000.0000 mg | Freq: Once | INTRAVENOUS | Status: AC
  Administered 2011-08-15: 1000 mg via INTRAVENOUS
  Filled 2011-08-15: qty 20

## 2011-08-15 MED ORDER — SODIUM CHLORIDE 0.9 % IV SOLN
Freq: Once | INTRAVENOUS | Status: AC
  Administered 2011-08-15: 10:00:00 via INTRAVENOUS

## 2011-08-15 NOTE — Progress Notes (Signed)
MEDICATION RELATED CONSULT NOTE - INITIAL   Pharmacy Consult for Infed Indication: IDA  Allergies  Allergen Reactions  . Nutritional Supplements Anaphylaxis  . Pregabalin Nausea And Vomiting  . [name]   . [name]     Patient Measurements: Height: 5' (152.4 cm) Weight: 173 lb (78.472 kg) IBW/kg (Calculated) : 45.5   Vital Signs: Temp: 97.8 F (36.6 C) (07/10 0900) Temp src: Oral (07/10 0900) BP: 128/51 mmHg (07/10 0900) Pulse Rate: 79  (07/10 0900) Intake/Output from previous day:   Intake/Output from this shift:    Labs: No results found for this basename: WBC:3,HGB:3,HCT:3,PLT:3,APTT:3;INR:3,CREATININE:3,LABCREA:3,CREATININE:3,CREAT24HRUR:3,MG:3,PHOS:3,ALBUMIN:3,PROT:3,ALBUMIN:3,AST:3,ALT:3,ALKPHOS:3,BILITOT:3,BILIDIR:3,IBILI:3 in the last 72 hours Estimated Creatinine Clearance: 34.1 ml/min (by C-G formula based on Cr of 1.2). Hgb 9  Microbiology: No results found for this or any previous visit (from the past 720 hour(s)).  Medical History: Past Medical History  Diagnosis Date  . Congestive heart failure   . Aortic stenosis   . Hypertension   . Dyslipidemia   . Depression   . Duodenitis 03/15/10    per capsule endoscopy report  . Hemorrhagic gastritis 03/15/10    per capsule endoscopy report  . Diverticulosis 03/14/10  . Hyperplastic colon polyp   . Anemia   . DVT (deep venous thrombosis)   . Anxiety   . Arthritis   . Blood transfusion   . Cataract bil cateract removed  . GERD (gastroesophageal reflux disease)   . Heart murmur   . Hyperlipidemia   . Hiatal hernia   . Osteoporosis   . Neuropathy     bil big toes    Assessment/Plan: Goal Hgb per consult is 12 g/dL. Using Iron Dextran calculation, patient to receive 1g Iron Dextran. Also per orders, no test dose is needed.    Kara Mead 08/15/2011,9:09 AM

## 2011-08-23 ENCOUNTER — Telehealth: Payer: Self-pay | Admitting: *Deleted

## 2011-08-23 DIAGNOSIS — H35729 Serous detachment of retinal pigment epithelium, unspecified eye: Secondary | ICD-10-CM | POA: Diagnosis not present

## 2011-08-23 DIAGNOSIS — N390 Urinary tract infection, site not specified: Secondary | ICD-10-CM | POA: Diagnosis not present

## 2011-08-23 DIAGNOSIS — H179 Unspecified corneal scar and opacity: Secondary | ICD-10-CM | POA: Diagnosis not present

## 2011-08-23 DIAGNOSIS — F411 Generalized anxiety disorder: Secondary | ICD-10-CM | POA: Diagnosis not present

## 2011-08-23 DIAGNOSIS — Z961 Presence of intraocular lens: Secondary | ICD-10-CM | POA: Diagnosis not present

## 2011-08-23 DIAGNOSIS — M169 Osteoarthritis of hip, unspecified: Secondary | ICD-10-CM | POA: Diagnosis not present

## 2011-08-23 DIAGNOSIS — R259 Unspecified abnormal involuntary movements: Secondary | ICD-10-CM | POA: Diagnosis not present

## 2011-08-23 DIAGNOSIS — H43399 Other vitreous opacities, unspecified eye: Secondary | ICD-10-CM | POA: Diagnosis not present

## 2011-08-23 NOTE — Telephone Encounter (Signed)
OK to take the supplements

## 2011-08-23 NOTE — Telephone Encounter (Signed)
Patient's son brought by a paper with supplements that [facility] would like patient to take. He wonders if they could "irritate her stomach." They include Vitamin C 250 mg, Vitamin E 200 iu, Zinc 7.5 mg, Copper 0.5 mg, Lutein 12.5 mg, bilberry 10 mg and Zeaxanthin 5 mg. I advised Mr Drapkin that this is very likely fine for the patient to take, but that I would double check with Dr Olevia Perches to make sure none of these supplements would "irritate her stomach." Dr Olevia Perches, please advise.

## 2011-08-24 NOTE — Telephone Encounter (Signed)
Patient advised that Dr Olevia Perches is okay with her taking the eye supplements. She verbalizes understanding.

## 2011-09-06 DIAGNOSIS — N390 Urinary tract infection, site not specified: Secondary | ICD-10-CM | POA: Diagnosis not present

## 2011-09-06 DIAGNOSIS — F411 Generalized anxiety disorder: Secondary | ICD-10-CM | POA: Diagnosis not present

## 2011-09-06 DIAGNOSIS — Z947 Corneal transplant status: Secondary | ICD-10-CM | POA: Diagnosis not present

## 2011-09-06 DIAGNOSIS — H04129 Dry eye syndrome of unspecified lacrimal gland: Secondary | ICD-10-CM | POA: Diagnosis not present

## 2011-09-10 DIAGNOSIS — M159 Polyosteoarthritis, unspecified: Secondary | ICD-10-CM | POA: Diagnosis not present

## 2011-09-10 DIAGNOSIS — M810 Age-related osteoporosis without current pathological fracture: Secondary | ICD-10-CM | POA: Diagnosis not present

## 2011-09-10 DIAGNOSIS — M25569 Pain in unspecified knee: Secondary | ICD-10-CM | POA: Diagnosis not present

## 2011-09-10 DIAGNOSIS — M545 Low back pain: Secondary | ICD-10-CM | POA: Diagnosis not present

## 2011-09-25 ENCOUNTER — Other Ambulatory Visit: Payer: Self-pay | Admitting: Internal Medicine

## 2011-09-25 DIAGNOSIS — D649 Anemia, unspecified: Secondary | ICD-10-CM

## 2011-09-26 ENCOUNTER — Other Ambulatory Visit: Payer: Self-pay | Admitting: Internal Medicine

## 2011-09-26 ENCOUNTER — Other Ambulatory Visit (INDEPENDENT_AMBULATORY_CARE_PROVIDER_SITE_OTHER): Payer: Medicare Other

## 2011-09-26 DIAGNOSIS — M1610 Unilateral primary osteoarthritis, unspecified hip: Secondary | ICD-10-CM | POA: Diagnosis not present

## 2011-09-26 DIAGNOSIS — I10 Essential (primary) hypertension: Secondary | ICD-10-CM

## 2011-09-26 DIAGNOSIS — M76899 Other specified enthesopathies of unspecified lower limb, excluding foot: Secondary | ICD-10-CM | POA: Diagnosis not present

## 2011-09-26 DIAGNOSIS — D649 Anemia, unspecified: Secondary | ICD-10-CM | POA: Diagnosis not present

## 2011-09-26 DIAGNOSIS — M545 Low back pain: Secondary | ICD-10-CM | POA: Diagnosis not present

## 2011-09-26 DIAGNOSIS — M810 Age-related osteoporosis without current pathological fracture: Secondary | ICD-10-CM

## 2011-09-26 DIAGNOSIS — Z79899 Other long term (current) drug therapy: Secondary | ICD-10-CM

## 2011-09-26 LAB — CBC WITH DIFFERENTIAL/PLATELET
Basophils Relative: 0.8 % (ref 0.0–3.0)
Eosinophils Relative: 1.5 % (ref 0.0–5.0)
HCT: 34.2 % — ABNORMAL LOW (ref 36.0–46.0)
Hemoglobin: 11.3 g/dL — ABNORMAL LOW (ref 12.0–15.0)
Lymphocytes Relative: 17.6 % (ref 12.0–46.0)
Lymphs Abs: 0.8 10*3/uL (ref 0.7–4.0)
Monocytes Relative: 10 % (ref 3.0–12.0)
Neutro Abs: 3.1 10*3/uL (ref 1.4–7.7)
RBC: 3.62 Mil/uL — ABNORMAL LOW (ref 3.87–5.11)
RDW: 13.9 % (ref 11.5–14.6)
WBC: 4.4 10*3/uL — ABNORMAL LOW (ref 4.5–10.5)

## 2011-09-26 LAB — COMPREHENSIVE METABOLIC PANEL
ALT: 14 U/L (ref 0–35)
Alkaline Phosphatase: 43 U/L (ref 39–117)
CO2: 27 mEq/L (ref 19–32)
Calcium: 9.5 mg/dL (ref 8.4–10.5)
Creatinine, Ser: 1.1 mg/dL (ref 0.4–1.2)
Sodium: 132 mEq/L — ABNORMAL LOW (ref 135–145)
Total Bilirubin: 0.4 mg/dL (ref 0.3–1.2)
Total Protein: 6.3 g/dL (ref 6.0–8.3)

## 2011-09-26 LAB — LIPID PANEL
HDL: 74.9 mg/dL (ref >39.00)
Total CHOL/HDL Ratio: 3
Triglycerides: 163 mg/dL — ABNORMAL HIGH (ref 0.0–149.0)
VLDL: 32.6 mg/dL (ref 0.0–40.0)

## 2011-09-27 ENCOUNTER — Other Ambulatory Visit: Payer: Self-pay | Admitting: *Deleted

## 2011-09-27 DIAGNOSIS — D509 Iron deficiency anemia, unspecified: Secondary | ICD-10-CM

## 2011-10-10 DIAGNOSIS — F411 Generalized anxiety disorder: Secondary | ICD-10-CM | POA: Diagnosis not present

## 2011-10-10 DIAGNOSIS — G4700 Insomnia, unspecified: Secondary | ICD-10-CM | POA: Diagnosis not present

## 2011-10-11 DIAGNOSIS — H04129 Dry eye syndrome of unspecified lacrimal gland: Secondary | ICD-10-CM | POA: Diagnosis not present

## 2011-11-07 ENCOUNTER — Encounter (HOSPITAL_COMMUNITY): Payer: Self-pay | Admitting: *Deleted

## 2011-11-07 ENCOUNTER — Emergency Department (HOSPITAL_COMMUNITY)
Admission: EM | Admit: 2011-11-07 | Discharge: 2011-11-07 | Disposition: A | Discharge status: Home / Self Care | Payer: Medicare Other | Attending: Emergency Medicine | Admitting: Emergency Medicine

## 2011-11-07 ENCOUNTER — Emergency Department (HOSPITAL_COMMUNITY): Payer: Medicare Other

## 2011-11-07 DIAGNOSIS — R1012 Left upper quadrant pain: Secondary | ICD-10-CM | POA: Diagnosis not present

## 2011-11-07 DIAGNOSIS — Z9089 Acquired absence of other organs: Secondary | ICD-10-CM | POA: Insufficient documentation

## 2011-11-07 DIAGNOSIS — K449 Diaphragmatic hernia without obstruction or gangrene: Secondary | ICD-10-CM | POA: Diagnosis not present

## 2011-11-07 DIAGNOSIS — Z79899 Other long term (current) drug therapy: Secondary | ICD-10-CM | POA: Insufficient documentation

## 2011-11-07 DIAGNOSIS — K5900 Constipation, unspecified: Secondary | ICD-10-CM | POA: Diagnosis not present

## 2011-11-07 DIAGNOSIS — I10 Essential (primary) hypertension: Secondary | ICD-10-CM | POA: Diagnosis not present

## 2011-11-07 DIAGNOSIS — I509 Heart failure, unspecified: Secondary | ICD-10-CM | POA: Insufficient documentation

## 2011-11-07 DIAGNOSIS — R109 Unspecified abdominal pain: Secondary | ICD-10-CM

## 2011-11-07 LAB — POCT I-STAT, CHEM 8
Creatinine, Ser: 1.1 mg/dL (ref 0.50–1.10)
HCT: 33 % — ABNORMAL LOW (ref 36.0–46.0)
Hemoglobin: 11.2 g/dL — ABNORMAL LOW (ref 12.0–15.0)
Potassium: 4 mEq/L (ref 3.5–5.1)
Sodium: 134 mEq/L — ABNORMAL LOW (ref 135–145)
TCO2: 21 mmol/L (ref 0–100)

## 2011-11-07 LAB — COMPREHENSIVE METABOLIC PANEL
Albumin: 3.4 g/dL — ABNORMAL LOW (ref 3.5–5.2)
BUN: 28 mg/dL — ABNORMAL HIGH (ref 6–23)
Calcium: 9.8 mg/dL (ref 8.4–10.5)
GFR calc Af Amer: 60 mL/min — ABNORMAL LOW (ref >90)
Glucose, Bld: 113 mg/dL — ABNORMAL HIGH (ref 70–99)
Potassium: 4.1 mEq/L (ref 3.5–5.1)
Sodium: 131 mEq/L — ABNORMAL LOW (ref 135–145)
Total Protein: 6.2 g/dL (ref 6.0–8.3)

## 2011-11-07 LAB — URINALYSIS, MICROSCOPIC ONLY
Leukocytes, UA: NEGATIVE
Nitrite: NEGATIVE
Specific Gravity, Urine: 1.007 (ref 1.005–1.030)
Urine-Other: NONE SEEN
Urobilinogen, UA: 0.2 mg/dL (ref 0.0–1.0)
pH: 6 (ref 5.0–8.0)

## 2011-11-07 LAB — CBC WITH DIFFERENTIAL/PLATELET
Basophils Relative: 0 % (ref 0–1)
Eosinophils Absolute: 0 10*3/uL (ref 0.0–0.7)
Eosinophils Relative: 0 % (ref 0–5)
Lymphs Abs: 0.7 10*3/uL (ref 0.7–4.0)
MCH: 31.6 pg (ref 26.0–34.0)
MCHC: 34.3 g/dL (ref 30.0–36.0)
MCV: 92.2 fL (ref 78.0–100.0)
Monocytes Relative: 8 % (ref 3–12)
Neutrophils Relative %: 79 % — ABNORMAL HIGH (ref 43–77)
Platelets: 252 10*3/uL (ref 150–400)
RBC: 3.45 MIL/uL — ABNORMAL LOW (ref 3.87–5.11)

## 2011-11-07 LAB — LIPASE, BLOOD: Lipase: 56 U/L (ref 11–59)

## 2011-11-07 MED ORDER — IOHEXOL 300 MG/ML  SOLN
100.0000 mL | Freq: Once | INTRAMUSCULAR | Status: AC | PRN
  Administered 2011-11-07: 100 mL via INTRAVENOUS

## 2011-11-07 MED ORDER — ONDANSETRON HCL 4 MG/2ML IJ SOLN
4.0000 mg | Freq: Once | INTRAMUSCULAR | Status: AC
  Administered 2011-11-07: 4 mg via INTRAVENOUS
  Filled 2011-11-07: qty 2

## 2011-11-07 MED ORDER — MORPHINE SULFATE 2 MG/ML IJ SOLN
2.0000 mg | Freq: Once | INTRAMUSCULAR | Status: AC
  Administered 2011-11-07: 2 mg via INTRAVENOUS
  Filled 2011-11-07: qty 1

## 2011-11-07 NOTE — ED Provider Notes (Addendum)
History     CSN: [id number]  Arrival date & time 11/07/11  1459   First MD Initiated Contact with Patient 11/07/11 1531      Chief Complaint  Patient presents with  . Abdominal Pain  . Nausea    (Consider location/radiation/quality/duration/timing/severity/associated sxs/prior treatment) Patient is a 78 y.o. female presenting with abdominal pain. The history is provided by the patient.  Abdominal Pain The primary symptoms of the illness include abdominal pain and nausea. The primary symptoms of the illness do not include fever, shortness of breath, vomiting, diarrhea or dysuria. Episode onset: 5 days ago. The onset of the illness was gradual. The problem has been gradually worsening.  The abdominal pain is located in the LUQ (left mid quadrant). The abdominal pain does not radiate. The severity of the abdominal pain is 8/10. The abdominal pain is relieved by nothing. The abdominal pain is exacerbated by movement (eating does not worsen symptoms).  Additional symptoms associated with the illness include constipation. Symptoms associated with the illness do not include urgency, frequency or back pain. Significant associated medical issues include PUD and GERD. Associated medical issues comments: prior GI bleeding.    Past Medical History  Diagnosis Date  . Congestive heart failure   . Aortic stenosis   . Hypertension   . Dyslipidemia   . Depression   . Duodenitis 03/15/10    per capsule endoscopy report  . Hemorrhagic gastritis 03/15/10    per capsule endoscopy report  . Diverticulosis 03/14/10  . Hyperplastic colon polyp   . Anemia   . DVT (deep venous thrombosis)   . Anxiety   . Arthritis   . Blood transfusion   . Cataract bil cateract removed  . GERD (gastroesophageal reflux disease)   . Heart murmur   . Hyperlipidemia   . Hiatal hernia   . Osteoporosis   . Neuropathy     bil big toes    Past Surgical History  Procedure Date  . Cholecystectomy   . Orif wrist fracture    . Fracture left knee 1993    x 2   . Foot surgery 1998  . Tubal ligation   . Plantar fasciitis repair     left  . Eye surgery     Family History  Problem Relation Age of Onset  . Uterine cancer Sister     Spread to colon     History  Substance Use Topics  . Smoking status: Former Smoker    Quit date: 01/08/1989  . Smokeless tobacco: Never Used  . Alcohol Use: No    OB History    Grav Para Term Preterm Abortions TAB SAB Ect Mult Living                  Review of Systems  Constitutional: Negative for fever.  Respiratory: Negative for shortness of breath.   Gastrointestinal: Positive for nausea, abdominal pain and constipation. Negative for vomiting and diarrhea.  Genitourinary: Negative for dysuria, urgency and frequency.  Musculoskeletal: Negative for back pain.  All other systems reviewed and are negative.    Allergies  Nutritional supplements; [name]; Sertraline; Lipitor; Pregabalin; and [name]  Home Medications   Current Outpatient Rx  Name Route Sig Dispense Refill  . ALPRAZOLAM 0.5 MG PO TABS  1/2- 1 tab by mouth three times a day as needed    . ARTIFICIAL TEAR SOLUTION OP SOLN Right Eye Place 1 each into the right eye daily.    [name] CALCIUM CITRATE-VITAMIN  D 315-200 MG-UNIT PO TABS  1 tablet. Take 3 tablets by mouth once daily    . VITAMIN D3 1000 UNITS PO CAPS Oral Take by mouth. Take 1 tablet by mouth once daily     . DEXILANT 60 MG PO CPDR  TAKE ONE CAPSULE BY MOUTH EVERY DAY 30 capsule 2  . FERROUS SULFATE 325 (65 FE) MG PO TABS Oral Take 1 tablet (325 mg total) by mouth 3 (three) times daily. 90 tablet 2  . FLUOROMETHOLONE ACETATE OP Left Eye Place 1 drop into the left eye 3 (three) times daily.    [name] GABAPENTIN 400 MG PO TABS Oral Take 400 mg by mouth 2 (two) times daily.     [name] HYDROCODONE-ACETAMINOPHEN 5-500 MG PO TABS  Take 1/2-1 tablet by mouth at bedtime     . LIDOCAINE 5 % EX PTCH Transdermal Place onto the skin. Use 1-3  patches topically every 12 hours     . VERAPAMIL HCL ER 240 MG PO CP24  Take 1 tablet by mouth daily    . DENOSUMAB 60 MG/ML [place] SOLN Subcutaneous Inject 60 mg into the skin once. One shot every 6 months.     . IRON DEXTRAN 50 MG/ML IJ SOLN  Iron infusion- Infed infustion over 4 hours of pharmacy calculated dose. No test dose needed. Weight-187 lbs Height - 5'.  Pt took 09-2011      BP 144/61  Pulse 79  Temp 98 F (36.7 C) (Oral)  Resp 16  SpO2 96%  Physical Exam  Nursing note and vitals reviewed. Constitutional: She is oriented to person, place, and time. She appears well-developed and well-nourished. No distress.  HENT:  Head: Normocephalic and atraumatic.  Mouth/Throat: Oropharynx is clear and moist.  Eyes: Conjunctivae normal and EOM are normal. Pupils are equal, round, and reactive to light.  Neck: Normal range of motion. Neck supple.  Cardiovascular: Normal rate, regular rhythm and intact distal pulses.   No murmur heard. Pulmonary/Chest: Effort normal and breath sounds normal. No respiratory distress. She has no wheezes. She has no rales.  Abdominal: Soft. Normal appearance. She exhibits no distension. There is tenderness in the left upper quadrant. There is guarding. There is no rebound and no CVA tenderness. No hernia.  Musculoskeletal: Normal range of motion. She exhibits no edema and no tenderness.  Neurological: She is alert and oriented to person, place, and time.  Skin: Skin is warm and dry. No rash noted. No erythema.  Psychiatric: She has a normal mood and affect. Her behavior is normal.    ED Course  Procedures (including critical care time)  Labs Reviewed  CBC WITH DIFFERENTIAL - Abnormal; Notable for the following:    RBC 3.45 (*)     Hemoglobin 10.9 (*)     HCT 31.8 (*)     Neutrophils Relative 79 (*)     All other components within normal limits  COMPREHENSIVE METABOLIC PANEL - Abnormal; Notable for the following:    Sodium 131 (*)     Glucose, Bld 113  (*)     BUN 28 (*)     Albumin 3.4 (*)     Total Bilirubin 0.2 (*)     GFR calc non Af Amer 52 (*)     GFR calc Af Amer 60 (*)     All other components within normal limits  POCT I-STAT, CHEM 8 - Abnormal; Notable for the following:    Sodium 134 (*)     BUN 30 (*)  Glucose, Bld 107 (*)     Hemoglobin 11.2 (*)     HCT 33.0 (*)     All other components within normal limits  LIPASE, BLOOD  URINALYSIS, MICROSCOPIC ONLY   Ct Abdomen Pelvis W Contrast  11/07/2011  *RADIOLOGY REPORT*  Clinical Data: Left sided abdominal pain, some nausea  CT ABDOMEN AND PELVIS WITH CONTRAST  Technique:  Multidetector CT imaging of the abdomen and pelvis was performed following the standard protocol during bolus administration of intravenous contrast.  Contrast: 18mL OMNIPAQUE IOHEXOL 300 MG/ML  SOLN  Comparison: None.  Findings: The lung bases are clear.  There is a moderate sized hiatal hernia present.  The liver enhances with no focal abnormality.  There is slight prominence of central intrahepatic ducts in this patient who appears to have undergone prior cholecystectomy.  This is most likely normal but correlation with LFTs is recommended.  The pancreatic duct is somewhat prominent throughout the entirety the pancreas but no mass is evident.  The right adrenal gland is unremarkable.  There is a probable left adrenal adenoma present which is low in attenuation.  The spleen is normal in size.  The stomach is decompressed.  The kidneys enhance with no calculus or mass and no hydronephrosis is seen.  The abdominal aorta is normal in caliber with moderate atheromatous change present.  No adenopathy is seen.  The urinary bladder is decompressed and cannot be well evaluated. The uterus  is unremarkable.  No adnexal lesion is seen.  No fluid is noted within the pelvis.  Scattered rectosigmoid colonic diverticula are present.  There is a moderate amount of feces throughout the colon.  The cecum appears to be on a long  mesentery, lying in the anterior mid line of the pelvis.  The terminal ileum is unremarkable.  Moderately severe thoracolumbar scoliosis is present with associated degenerative change.  IMPRESSION:  1.  No explanation for the patient's pain is seen.  There is a moderate amount of feces throughout the colon. 2.  Moderate sized hiatal hernia. 3.  Slightly prominent central intrahepatic ducts most likely normal post cholecystectomy.  Correlate with LFTs. 4.  Moderately severe thoracolumbar scoliosis with degenerative change.   Original Report Authenticated By: Joretta Bachelor, M.D.      1. Abdominal  pain, other specified site   2. Constipation       MDM   Patient with a history of 5 days of left-sided abdominal pain which is continuing to get worse. It is not affected by eating she does feel nauseated however no vomiting. She states she's had normal bowel movements but is always constipated. She denies any signs of bleeding. She is status post cholecystectomy and appendectomy. But does have a history of diverticulosis.  CMP is within normal limits. Lipase and CBC also within normal limits. UA pending. CT abdomen and pelvis pending she was given pain and nausea medication  7:15 PM. All labs and CT within normal limits except for a moderate stool burden which is most likely the cause of her symptoms. Patient states she stays constipated. She is not currently taking anything for constipation. She will initially try MiraLAX and if that works at the magnesium citrate.     Blanchie Dessert, MD 11/07/11 1916  Blanchie Dessert, MD 11/07/11 [id number]

## 2011-11-07 NOTE — ED Notes (Signed)
Pt reports left abdominal pain starting Friday of last week and has continued to worsen. Pt denies history of similar pain.  Pt reports nausea without vomiting.  Pt reports last normal BM was a few days ago.

## 2011-11-09 ENCOUNTER — Other Ambulatory Visit: Payer: Self-pay | Admitting: *Deleted

## 2011-11-09 DIAGNOSIS — D649 Anemia, unspecified: Secondary | ICD-10-CM

## 2011-11-19 DIAGNOSIS — H1810 Bullous keratopathy, unspecified eye: Secondary | ICD-10-CM | POA: Diagnosis not present

## 2011-11-26 DIAGNOSIS — H1810 Bullous keratopathy, unspecified eye: Secondary | ICD-10-CM | POA: Diagnosis not present

## 2011-11-26 DIAGNOSIS — H04209 Unspecified epiphora, unspecified lacrimal gland: Secondary | ICD-10-CM | POA: Diagnosis not present

## 2011-11-26 DIAGNOSIS — H538 Other visual disturbances: Secondary | ICD-10-CM | POA: Diagnosis not present

## 2011-12-03 DIAGNOSIS — H1810 Bullous keratopathy, unspecified eye: Secondary | ICD-10-CM | POA: Diagnosis not present

## 2011-12-17 DIAGNOSIS — H538 Other visual disturbances: Secondary | ICD-10-CM | POA: Diagnosis not present

## 2011-12-17 DIAGNOSIS — H1810 Bullous keratopathy, unspecified eye: Secondary | ICD-10-CM | POA: Diagnosis not present

## 2011-12-17 DIAGNOSIS — H04209 Unspecified epiphora, unspecified lacrimal gland: Secondary | ICD-10-CM | POA: Diagnosis not present

## 2011-12-17 DIAGNOSIS — Z23 Encounter for immunization: Secondary | ICD-10-CM | POA: Diagnosis not present

## 2011-12-20 ENCOUNTER — Telehealth: Payer: Self-pay | Admitting: *Deleted

## 2011-12-20 NOTE — Telephone Encounter (Signed)
Message copied by Hulan Saas on Thu Dec 20, 2011 10:42 AM ------      Message from: Hulan Saas      Created: Fri Nov 09, 2011  8:57 AM       Call and remind CBC due on 12/24/11 DB

## 2011-12-20 NOTE — Telephone Encounter (Signed)
Line busy. Will try again later.

## 2011-12-21 NOTE — Telephone Encounter (Signed)
Spoke with patient's son and reminded him she will come for labs next week.

## 2011-12-21 NOTE — Telephone Encounter (Signed)
Line busy, will try again later

## 2011-12-27 DIAGNOSIS — Z961 Presence of intraocular lens: Secondary | ICD-10-CM | POA: Diagnosis not present

## 2012-01-07 ENCOUNTER — Other Ambulatory Visit (INDEPENDENT_AMBULATORY_CARE_PROVIDER_SITE_OTHER): Payer: Medicare Other

## 2012-01-07 DIAGNOSIS — D649 Anemia, unspecified: Secondary | ICD-10-CM | POA: Diagnosis not present

## 2012-01-07 DIAGNOSIS — Z299 Encounter for prophylactic measures, unspecified: Secondary | ICD-10-CM | POA: Diagnosis not present

## 2012-01-07 DIAGNOSIS — M810 Age-related osteoporosis without current pathological fracture: Secondary | ICD-10-CM | POA: Diagnosis not present

## 2012-01-07 LAB — CBC WITH DIFFERENTIAL/PLATELET
Basophils Relative: 0.5 % (ref 0.0–3.0)
Eosinophils Absolute: 0.1 10*3/uL (ref 0.0–0.7)
Hemoglobin: 10.9 g/dL — ABNORMAL LOW (ref 12.0–15.0)
Lymphocytes Relative: 16.8 % (ref 12.0–46.0)
MCHC: 33.3 g/dL (ref 30.0–36.0)
Monocytes Relative: 12.3 % — ABNORMAL HIGH (ref 3.0–12.0)
Neutrophils Relative %: 69.2 % (ref 43.0–77.0)
RBC: 3.4 Mil/uL — ABNORMAL LOW (ref 3.87–5.11)
WBC: 5 10*3/uL (ref 4.5–10.5)

## 2012-01-08 ENCOUNTER — Other Ambulatory Visit: Payer: Self-pay | Admitting: *Deleted

## 2012-01-08 DIAGNOSIS — D509 Iron deficiency anemia, unspecified: Secondary | ICD-10-CM

## 2012-01-08 MED ORDER — FERROUS SULFATE 325 (65 FE) MG PO TABS: 325.0000 mg | ORAL_TABLET | Freq: Three times a day (TID) | ORAL | Status: DC

## 2012-01-21 DIAGNOSIS — K649 Unspecified hemorrhoids: Secondary | ICD-10-CM | POA: Diagnosis not present

## 2012-01-21 DIAGNOSIS — K644 Residual hemorrhoidal skin tags: Secondary | ICD-10-CM | POA: Diagnosis not present

## 2012-01-23 DIAGNOSIS — H1810 Bullous keratopathy, unspecified eye: Secondary | ICD-10-CM | POA: Diagnosis not present

## 2012-01-31 DIAGNOSIS — J45909 Unspecified asthma, uncomplicated: Secondary | ICD-10-CM | POA: Diagnosis not present

## 2012-03-06 ENCOUNTER — Telehealth: Payer: Self-pay | Admitting: *Deleted

## 2012-03-06 NOTE — Telephone Encounter (Signed)
Spoke with patient and she will come for labs next week. 

## 2012-03-06 NOTE — Telephone Encounter (Signed)
Message copied by Hulan Saas on Thu Mar 06, 2012 11:18 AM ------      Message from: Hulan Saas      Created: Tue Jan 08, 2012  9:56 AM       Call and remind due for CBC for DB on 03/10/12. Lab in [facility]

## 2012-03-12 ENCOUNTER — Other Ambulatory Visit (INDEPENDENT_AMBULATORY_CARE_PROVIDER_SITE_OTHER): Payer: Medicare Other

## 2012-03-12 DIAGNOSIS — M159 Polyosteoarthritis, unspecified: Secondary | ICD-10-CM | POA: Diagnosis not present

## 2012-03-12 DIAGNOSIS — M545 Low back pain: Secondary | ICD-10-CM | POA: Diagnosis not present

## 2012-03-12 DIAGNOSIS — M810 Age-related osteoporosis without current pathological fracture: Secondary | ICD-10-CM | POA: Diagnosis not present

## 2012-03-12 DIAGNOSIS — D509 Iron deficiency anemia, unspecified: Secondary | ICD-10-CM

## 2012-03-12 DIAGNOSIS — M25569 Pain in unspecified knee: Secondary | ICD-10-CM | POA: Diagnosis not present

## 2012-03-12 DIAGNOSIS — M1710 Unilateral primary osteoarthritis, unspecified knee: Secondary | ICD-10-CM | POA: Diagnosis not present

## 2012-03-12 LAB — CBC WITH DIFFERENTIAL/PLATELET
Basophils Relative: 0.6 % (ref 0.0–3.0)
Eosinophils Absolute: 0.1 10*3/uL (ref 0.0–0.7)
HCT: 26.2 % — ABNORMAL LOW (ref 36.0–46.0)
Hemoglobin: 8.7 g/dL — ABNORMAL LOW (ref 12.0–15.0)
Lymphocytes Relative: 14.1 % (ref 12.0–46.0)
Lymphs Abs: 0.7 10*3/uL (ref 0.7–4.0)
MCHC: 33.3 g/dL (ref 30.0–36.0)
Monocytes Relative: 9.4 % (ref 3.0–12.0)
Neutro Abs: 3.5 10*3/uL (ref 1.4–7.7)
RBC: 2.72 Mil/uL — ABNORMAL LOW (ref 3.87–5.11)

## 2012-03-12 NOTE — Progress Notes (Signed)
Quick Note:  Please call pt, Hgb has dropped to 8.7. Last Iron infusion July 20. Please set up an Iron infusion ASAP. It has been 1 year since her last OV. Please schedule first available appointment. Thanx ______

## 2012-03-13 ENCOUNTER — Encounter: Payer: Self-pay | Admitting: *Deleted

## 2012-03-13 ENCOUNTER — Other Ambulatory Visit: Payer: Self-pay | Admitting: *Deleted

## 2012-03-17 DIAGNOSIS — H04129 Dry eye syndrome of unspecified lacrimal gland: Secondary | ICD-10-CM | POA: Diagnosis not present

## 2012-03-17 DIAGNOSIS — H1810 Bullous keratopathy, unspecified eye: Secondary | ICD-10-CM | POA: Diagnosis not present

## 2012-03-17 DIAGNOSIS — Z961 Presence of intraocular lens: Secondary | ICD-10-CM | POA: Diagnosis not present

## 2012-03-17 DIAGNOSIS — Z947 Corneal transplant status: Secondary | ICD-10-CM | POA: Diagnosis not present

## 2012-03-18 ENCOUNTER — Other Ambulatory Visit (HOSPITAL_COMMUNITY): Payer: Self-pay | Admitting: Internal Medicine

## 2012-03-19 ENCOUNTER — Encounter (HOSPITAL_COMMUNITY)
Admission: RE | Admit: 2012-03-19 | Discharge: 2012-03-19 | Disposition: A | Discharge status: Home / Self Care | Payer: Medicare Other | Source: Ambulatory Visit | Attending: Internal Medicine | Admitting: Internal Medicine

## 2012-03-19 ENCOUNTER — Encounter (HOSPITAL_COMMUNITY): Payer: Self-pay

## 2012-03-19 DIAGNOSIS — D509 Iron deficiency anemia, unspecified: Secondary | ICD-10-CM | POA: Diagnosis not present

## 2012-03-19 MED ORDER — SODIUM CHLORIDE 0.9 % IV SOLN
1000.0000 mg | Freq: Once | INTRAVENOUS | Status: AC
  Administered 2012-03-19: 1000 mg via INTRAVENOUS
  Filled 2012-03-19: qty 20

## 2012-03-19 MED ORDER — SODIUM CHLORIDE 0.9 % IV SOLN
Freq: Once | INTRAVENOUS | Status: AC
  Administered 2012-03-19: 09:00:00 via INTRAVENOUS

## 2012-03-19 NOTE — Progress Notes (Signed)
MEDICATION RELATED CONSULT NOTE - INITIAL   Pharmacy Consult for IV iron  Labs: Hgb 8.7  A/P: Stated Hgb goal of 12. Patient has had IV iron previously so does not need a test dose. Using IV iron calculation, will give dose of 1000mg .   Adrian Saran, PharmD, BCPS Pager [phone] 03/19/2012 9:09 AM

## 2012-03-19 NOTE — Progress Notes (Signed)
Tolerated iron infusion well. Verbalizes understanding of discharge instructions. Will be discharged home with son.

## 2012-03-26 ENCOUNTER — Other Ambulatory Visit: Payer: Self-pay | Admitting: *Deleted

## 2012-03-26 ENCOUNTER — Ambulatory Visit (INDEPENDENT_AMBULATORY_CARE_PROVIDER_SITE_OTHER): Payer: Medicare Other | Admitting: Internal Medicine

## 2012-03-26 ENCOUNTER — Encounter: Payer: Self-pay | Admitting: Internal Medicine

## 2012-03-26 ENCOUNTER — Other Ambulatory Visit (INDEPENDENT_AMBULATORY_CARE_PROVIDER_SITE_OTHER): Payer: Medicare Other

## 2012-03-26 VITALS — BP 118/68 | HR 113 | Ht 60.0 in | Wt 171.0 lb

## 2012-03-26 DIAGNOSIS — K2970 Gastritis, unspecified, without bleeding: Secondary | ICD-10-CM | POA: Diagnosis not present

## 2012-03-26 DIAGNOSIS — D509 Iron deficiency anemia, unspecified: Secondary | ICD-10-CM

## 2012-03-26 DIAGNOSIS — R195 Other fecal abnormalities: Secondary | ICD-10-CM

## 2012-03-26 DIAGNOSIS — K2990 Gastroduodenitis, unspecified, without bleeding: Secondary | ICD-10-CM

## 2012-03-26 DIAGNOSIS — D649 Anemia, unspecified: Secondary | ICD-10-CM

## 2012-03-26 LAB — CBC WITH DIFFERENTIAL/PLATELET
Basophils Absolute: 0 10*3/uL (ref 0.0–0.1)
Eosinophils Absolute: 0 10*3/uL (ref 0.0–0.7)
HCT: 31.3 % — ABNORMAL LOW (ref 36.0–46.0)
Hemoglobin: 10.3 g/dL — ABNORMAL LOW (ref 12.0–15.0)
Lymphs Abs: 0.6 10*3/uL — ABNORMAL LOW (ref 0.7–4.0)
MCHC: 32.9 g/dL (ref 30.0–36.0)
Neutro Abs: 4.7 10*3/uL (ref 1.4–7.7)
RDW: 14.4 % (ref 11.5–14.6)

## 2012-03-26 NOTE — Progress Notes (Signed)
Sophia [name] 1928-07-06 MRN [id number]  History of Present Illness:  This is an 79 year old white female with chronic GI blood loss, chronic iron deficiency anemia despite of oral iron and iron infusions. 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 Her last appointment was in February 2013. She was able to maintain her hemoglobin at 10-11 g until 2 weeks ago when her hemoglobin dropped to 8.7. She saw a small amount of blood on the toilet tissue. She received an iron infusion last week. Her prior iron infusions were in March 2012, July 2013 and her upper and lower endoscopies were in March 2012. Her last blood transfusion for hemoglobin of 6.4 was in December 2012. Her small bowel capsule endoscopy showed hemorrhagic gastritis in February 2012. She has a history of aortic stenosis. She lives by herself but does not drive. Her son brings her to her appointments.   Past Medical History  Diagnosis Date  . Congestive heart failure   . Aortic stenosis   . Hypertension   . Dyslipidemia   . Depression   . Duodenitis 03/15/10    per capsule endoscopy report  . Hemorrhagic gastritis 03/15/10    per capsule endoscopy report  . Diverticulosis 03/14/10  . Hyperplastic colon polyp   . Anemia   . DVT (deep venous thrombosis)   . Anxiety   . Arthritis   . Blood transfusion   . Cataracts, bilateral     removed  . GERD (gastroesophageal reflux disease)   . Heart murmur   . Hyperlipidemia   . Hiatal hernia   . Osteoporosis   . Neuropathy     bil big toes   Past Surgical History  Procedure Laterality Date  . Cholecystectomy    . Orif wrist fracture    . Fracture left knee  1993    x 2   . Foot surgery  1998  . Tubal ligation    . Plantar fasciitis repair      left  . Cataract extraction, bilateral      reports that she quit smoking about 23 years ago. She has never used smokeless tobacco. She reports that she does not drink alcohol or use illicit drugs. family  history includes Uterine cancer in her sister. Allergies  Allergen Reactions  . Nutritional Supplements Anaphylaxis  . [name] Shortness Of Breath  . Sertraline Shortness Of Breath  . Lipitor (Atorvastatin) Other (See Comments)    Muscle aches  . Pregabalin Nausea And Vomiting  . [name] Nausea And Vomiting        Review of Systems: Negative for dysphagia, odynophagia, abdominal pain  The remainder of the 10 point ROS is negative except as outlined in H&P   Physical Exam: General appearance  Well developed, in no distress. Eyes- non icteric. HEENT nontraumatic, normocephalic. Mouth no lesions, tongue papillated, no cheilosis. Neck supple without adenopathy, thyroid not enlarged, no carotid bruits, no JVD. Lungs Clear to auscultation bilaterally. Cor normal S1, normal S2, regular rhythm, harsh 3/6 systolic ejection murmur, quiet precordium. Abdomen: Soft nontender with normoactive bowel sounds. Rectal: Dark Hemoccult-positive stool. Extremities no pedal edema., Walks with walker Skin no lesions. Neurological alert and oriented x 3. Psychological normal mood and affect.  Assessment and Plan:  Problem #61 79 year old white female with chronic low-grade GI blood loss necessitating transfusions and intravenous iron infusions. Her hemoglobin recently dropped to 8.7. She received an iron infusion last week. We will recheck her blood count today. She will continue on Dexilant  60 mg daily and continue on her iron supplements. I have spoken to her son who came with her. Her GI work up has been completed.   03/26/2012 [name]

## 2012-03-26 NOTE — Patient Instructions (Addendum)
Your physician has requested that you go to the basement for the following lab work before leaving today: CBC  CC: Dr [name]

## 2012-04-01 ENCOUNTER — Other Ambulatory Visit: Payer: Self-pay | Admitting: Internal Medicine

## 2012-04-14 DIAGNOSIS — H353190 Nonexudative age-related macular degeneration, unspecified eye, stage unspecified: Secondary | ICD-10-CM | POA: Diagnosis not present

## 2012-04-14 DIAGNOSIS — H43399 Other vitreous opacities, unspecified eye: Secondary | ICD-10-CM | POA: Diagnosis not present

## 2012-04-14 DIAGNOSIS — H1810 Bullous keratopathy, unspecified eye: Secondary | ICD-10-CM | POA: Diagnosis not present

## 2012-04-17 ENCOUNTER — Telehealth: Payer: Self-pay | Admitting: *Deleted

## 2012-04-17 NOTE — Telephone Encounter (Signed)
Patient notified and she will come next week for labs. 

## 2012-04-17 NOTE — Telephone Encounter (Signed)
Message copied by Hulan Saas on Thu Apr 17, 2012 10:07 AM ------      Message from: Hulan Saas      Created: Wed Mar 26, 2012  1:18 PM       Call and remind patient due for CBC for DB on 04/21/12 ------

## 2012-04-24 ENCOUNTER — Other Ambulatory Visit (INDEPENDENT_AMBULATORY_CARE_PROVIDER_SITE_OTHER): Payer: Medicare Other

## 2012-04-24 DIAGNOSIS — D649 Anemia, unspecified: Secondary | ICD-10-CM | POA: Diagnosis not present

## 2012-04-24 LAB — CBC WITH DIFFERENTIAL/PLATELET
Basophils Absolute: 0 10*3/uL (ref 0.0–0.1)
Lymphocytes Relative: 14.8 % (ref 12.0–46.0)
Monocytes Relative: 9.9 % (ref 3.0–12.0)
Neutrophils Relative %: 73.2 % (ref 43.0–77.0)
Platelets: 247 10*3/uL (ref 150.0–400.0)
RDW: 14.6 % (ref 11.5–14.6)

## 2012-04-25 ENCOUNTER — Other Ambulatory Visit: Payer: Self-pay | Admitting: *Deleted

## 2012-04-25 DIAGNOSIS — D500 Iron deficiency anemia secondary to blood loss (chronic): Secondary | ICD-10-CM

## 2012-04-27 NOTE — H&P (Signed)
History of Present Illness:  This is an 79 year old white female with chronic GI blood loss, chronic iron deficiency anemia despite of oral iron and iron infusions. 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 Her last appointment was in February 2013. She was able to maintain her hemoglobin at 10-11 g until 2 weeks ago when her hemoglobin dropped to 8.7. She saw a small amount of blood on the toilet tissue. She received an iron infusion last week. Her prior iron infusions were in March 2012, July 2013 and her upper and lower endoscopies were in March 2012. Her last blood transfusion for hemoglobin of 6.4 was in December 2012. Her small bowel capsule endoscopy showed hemorrhagic gastritis in February 2012. She has a history of aortic stenosis. She lives by herself but does not drive. Her son brings her to her appointments.  Past Medical History   Diagnosis  Date   .  Congestive heart failure    .  Aortic stenosis    .  Hypertension    .  Dyslipidemia    .  Depression    .  Duodenitis  03/15/10     per capsule endoscopy report   .  Hemorrhagic gastritis  03/15/10     per capsule endoscopy report   .  Diverticulosis  03/14/10   .  Hyperplastic colon polyp    .  Anemia    .  DVT (deep venous thrombosis)    .  Anxiety    .  Arthritis    .  Blood transfusion    .  Cataracts, bilateral      removed   .  GERD (gastroesophageal reflux disease)    .  Heart murmur    .  Hyperlipidemia    .  Hiatal hernia    .  Osteoporosis    .  Neuropathy      bil big toes    Past Surgical History   Procedure  Laterality  Date   .  Cholecystectomy     .  Orif wrist fracture     .  Fracture left knee   1993     x 2   .  Foot surgery   1998   .  Tubal ligation     .  Plantar fasciitis repair       left   .  Cataract extraction, bilateral     reports that she quit smoking about 23 years ago. She has never used smokeless tobacco. She reports that she does not drink alcohol or use  illicit drugs.  family history includes Uterine cancer in her sister.  Allergies   Allergen  Reactions   .  Nutritional Supplements  Anaphylaxis   .  [name]  Shortness Of Breath   .  Sertraline  Shortness Of Breath   .  Lipitor (Atorvastatin)  Other (See Comments)     Muscle aches   .  Pregabalin  Nausea And Vomiting   .  [name]  Nausea And Vomiting   Review of Systems: Negative for dysphagia, odynophagia, abdominal pain  The remainder of the 10 point ROS is negative except as outlined in H&P  Physical Exam:  General appearance Well developed, in no distress.  Eyes- non icteric.  HEENT nontraumatic, normocephalic.  Mouth no lesions, tongue papillated, no cheilosis.  Neck supple without adenopathy, thyroid not enlarged, no carotid bruits, no JVD.  Lungs Clear to auscultation bilaterally.  Cor normal S1, normal S2, regular rhythm,  harsh 3/6 systolic ejection murmur, quiet precordium.  Abdomen: Soft nontender with normoactive bowel sounds.  Rectal: Dark Hemoccult-positive stool.  Extremities no pedal edema., Walks with walker  Skin no lesions.  Neurological alert and oriented x 3.  Psychological normal mood and affect.  Assessment and Plan:  Problem #75 79 year old white female with chronic low-grade GI blood loss necessitating transfusions and intravenous iron infusions. Her hemoglobin recently dropped to 8.7. She received an iron infusion last week. We will recheck her blood count today. She will continue on Dexilant 60 mg daily and continue on her iron supplements. I have spoken to her son who came with her. Her GI work up has been completed.  03/26/2012  [name]

## 2012-04-28 ENCOUNTER — Encounter (HOSPITAL_COMMUNITY): Payer: Self-pay

## 2012-04-28 ENCOUNTER — Encounter (HOSPITAL_COMMUNITY)
Admission: RE | Disposition: A | Discharge status: Home / Self Care | Payer: Self-pay | Source: Ambulatory Visit | Attending: Internal Medicine

## 2012-04-28 ENCOUNTER — Ambulatory Visit (HOSPITAL_COMMUNITY)
Admission: RE | Admit: 2012-04-28 | Discharge: 2012-04-28 | Disposition: A | Discharge status: Home / Self Care | Payer: Medicare Other | Source: Ambulatory Visit | Attending: Internal Medicine | Admitting: Internal Medicine

## 2012-04-28 DIAGNOSIS — E785 Hyperlipidemia, unspecified: Secondary | ICD-10-CM | POA: Insufficient documentation

## 2012-04-28 DIAGNOSIS — K31811 Angiodysplasia of stomach and duodenum with bleeding: Secondary | ICD-10-CM | POA: Insufficient documentation

## 2012-04-28 DIAGNOSIS — K219 Gastro-esophageal reflux disease without esophagitis: Secondary | ICD-10-CM | POA: Insufficient documentation

## 2012-04-28 DIAGNOSIS — D508 Other iron deficiency anemias: Secondary | ICD-10-CM | POA: Diagnosis not present

## 2012-04-28 DIAGNOSIS — K2971 Gastritis, unspecified, with bleeding: Secondary | ICD-10-CM | POA: Diagnosis not present

## 2012-04-28 DIAGNOSIS — I10 Essential (primary) hypertension: Secondary | ICD-10-CM | POA: Diagnosis not present

## 2012-04-28 DIAGNOSIS — D509 Iron deficiency anemia, unspecified: Secondary | ICD-10-CM | POA: Insufficient documentation

## 2012-04-28 DIAGNOSIS — Z86718 Personal history of other venous thrombosis and embolism: Secondary | ICD-10-CM | POA: Insufficient documentation

## 2012-04-28 DIAGNOSIS — K31819 Angiodysplasia of stomach and duodenum without bleeding: Secondary | ICD-10-CM

## 2012-04-28 DIAGNOSIS — K2289 Other specified disease of esophagus: Secondary | ICD-10-CM | POA: Diagnosis not present

## 2012-04-28 DIAGNOSIS — I509 Heart failure, unspecified: Secondary | ICD-10-CM | POA: Diagnosis not present

## 2012-04-28 DIAGNOSIS — K449 Diaphragmatic hernia without obstruction or gangrene: Secondary | ICD-10-CM | POA: Insufficient documentation

## 2012-04-28 DIAGNOSIS — D500 Iron deficiency anemia secondary to blood loss (chronic): Secondary | ICD-10-CM

## 2012-04-28 DIAGNOSIS — K228 Other specified diseases of esophagus: Secondary | ICD-10-CM | POA: Insufficient documentation

## 2012-04-28 HISTORY — PX: ESOPHAGOGASTRODUODENOSCOPY: SHX5428

## 2012-04-28 HISTORY — PX: HOT HEMOSTASIS: SHX5433

## 2012-04-28 SURGERY — EGD (ESOPHAGOGASTRODUODENOSCOPY)
Anesthesia: Moderate Sedation

## 2012-04-28 MED ORDER — MIDAZOLAM HCL 10 MG/2ML IJ SOLN
INTRAMUSCULAR | Status: DC | PRN
  Administered 2012-04-28: 2.5 mg via INTRAVENOUS

## 2012-04-28 MED ORDER — MIDAZOLAM HCL 10 MG/2ML IJ SOLN
INTRAMUSCULAR | Status: AC
  Filled 2012-04-28: qty 4

## 2012-04-28 MED ORDER — FENTANYL CITRATE 0.05 MG/ML IJ SOLN
INTRAMUSCULAR | Status: DC | PRN
  Administered 2012-04-28: 25 ug via INTRAVENOUS

## 2012-04-28 MED ORDER — FENTANYL CITRATE 0.05 MG/ML IJ SOLN
INTRAMUSCULAR | Status: AC
  Filled 2012-04-28: qty 4

## 2012-04-28 MED ORDER — BUTAMBEN-TETRACAINE-BENZOCAINE 2-2-14 % EX AERO
INHALATION_SPRAY | CUTANEOUS | Status: DC | PRN
  Administered 2012-04-28: 2 via TOPICAL

## 2012-04-28 MED ORDER — SODIUM CHLORIDE 0.9 % IV SOLN
INTRAVENOUS | Status: DC
  Administered 2012-04-28: 11:00:00 via INTRAVENOUS

## 2012-04-28 NOTE — Op Note (Signed)
[facility] [place], [id number]   ENDOSCOPY PROCEDURE REPORT  PATIENT: Sophia, [name]  MR#: [id number] BIRTHDATE: 07-14-1928 , 83  yrs. old GENDER: Female ENDOSCOPIST: Lafayette Dragon, MD REFERRED BY:  Alden Server, M.D. 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 MEDICATIONS: These medications were titrated to patient response per physician's verbal order, Fentanyl 25 mcg IV, and Versed-Detailed 2.5 mg IV TOPICAL ANESTHETIC: Cetacaine Spray  DESCRIPTION OF PROCEDURE: After the risks benefits and alternatives of the procedure were thoroughly explained, informed consent was obtained.  The    endoscope was introduced through the mouth and advanced to the second portion of the duodenum. Without limitations.  The instrument was slowly withdrawn as the mucosa was fully examined.      Esophagus: tortuous esophagus c/w presbyesophagus, spastic LES, STOMACH: 2-3 cm hiatal hernia, 2 avm's in the gastric fundus, ablated with APC coagulator, extensive vascular ectasias in the prepyloric antrum, treated extensively with an APC ( Argon Plasma coagulator,) side- probe, DUODENUM; bulb and descending duodenum normal[          The scope was then withdrawn from the patient and the procedure completed.  COMPLICATIONS: There were no complications. ENDOSCOPIC IMPRESSION:  1. 2 AVM's gastric fundus, ablated with an APC 2. GAVE prepyloric antrum, ablated with side-probe,APC 3. Presbyesophagus   RECOMMENDATIONS: 1. continue PPI 2.  continue to monitor H/H, nex 2 weeks 3. will likely need a repeat exam in 4 weeks  REPEAT EXAM: 4 weeks  eSigned:  Lafayette Dragon, MD 04/28/2012 12:25 PM   CC:  PATIENT NAME:  Sophia [name], Sophia [name] MR#: [id number]

## 2012-04-28 NOTE — Interval H&P Note (Signed)
History and Physical Interval Note:  04/28/2012 11:19 AM  Sophia [name]  has presented today for surgery, with the diagnosis of anemia, watermelon stomach  The various methods of treatment have been discussed with the patient and family. After consideration of risks, benefits and other options for treatment, the patient has consented to  Procedure(s): ESOPHAGOGASTRODUODENOSCOPY (EGD) (N/A) HOT HEMOSTASIS (ARGON PLASMA COAGULATION/BICAP) (N/A) as a surgical intervention .  The patient's history has been reviewed, patient examined, no change in status, stable for surgery.  I have reviewed the patient's chart and labs.  Questions were answered to the patient's satisfaction.     [name]

## 2012-04-29 ENCOUNTER — Other Ambulatory Visit: Payer: Self-pay | Admitting: *Deleted

## 2012-04-29 ENCOUNTER — Encounter (HOSPITAL_COMMUNITY): Payer: Self-pay | Admitting: Internal Medicine

## 2012-04-29 DIAGNOSIS — D500 Iron deficiency anemia secondary to blood loss (chronic): Secondary | ICD-10-CM

## 2012-05-08 ENCOUNTER — Telehealth: Payer: Self-pay | Admitting: *Deleted

## 2012-05-08 NOTE — Telephone Encounter (Signed)
Spoke with patient and reminded of labs due for Dr. Olevia Perches

## 2012-05-08 NOTE — Telephone Encounter (Signed)
Message copied by Hulan Saas on Thu May 08, 2012  8:53 AM ------      Message from: Hulan Saas      Created: Tue Apr 29, 2012 10:20 AM       Call and remind patient due for CBC for DB on 05/12/12 ------

## 2012-05-15 ENCOUNTER — Other Ambulatory Visit (INDEPENDENT_AMBULATORY_CARE_PROVIDER_SITE_OTHER): Payer: Medicare Other

## 2012-05-15 DIAGNOSIS — D500 Iron deficiency anemia secondary to blood loss (chronic): Secondary | ICD-10-CM | POA: Diagnosis not present

## 2012-05-15 LAB — CBC WITH DIFFERENTIAL/PLATELET
Basophils Absolute: 0 10*3/uL (ref 0.0–0.1)
Basophils Relative: 0.8 % (ref 0.0–3.0)
Eosinophils Absolute: 0.1 10*3/uL (ref 0.0–0.7)
MCHC: 33 g/dL (ref 30.0–36.0)
MCV: 96.3 fl (ref 78.0–100.0)
Monocytes Absolute: 0.3 10*3/uL (ref 0.1–1.0)
Neutro Abs: 3.3 10*3/uL (ref 1.4–7.7)
Neutrophils Relative %: 76 % (ref 43.0–77.0)
RBC: 3.11 Mil/uL — ABNORMAL LOW (ref 3.87–5.11)
RDW: 13.7 % (ref 11.5–14.6)

## 2012-05-16 ENCOUNTER — Other Ambulatory Visit: Payer: Self-pay | Admitting: *Deleted

## 2012-05-16 DIAGNOSIS — D649 Anemia, unspecified: Secondary | ICD-10-CM

## 2012-06-12 ENCOUNTER — Telehealth: Payer: Self-pay | Admitting: *Deleted

## 2012-06-12 NOTE — Telephone Encounter (Signed)
Patient will come for labs next week. 

## 2012-06-12 NOTE — Telephone Encounter (Signed)
Message copied by Hulan Saas on Thu Jun 12, 2012  9:19 AM ------      Message from: Hulan Saas      Created: Fri May 16, 2012  9:18 AM       Call and remind patient due for CBC on 06/16/12 DB ------

## 2012-06-24 ENCOUNTER — Encounter: Payer: Self-pay | Admitting: *Deleted

## 2012-06-25 ENCOUNTER — Other Ambulatory Visit (INDEPENDENT_AMBULATORY_CARE_PROVIDER_SITE_OTHER): Payer: Medicare Other

## 2012-06-25 DIAGNOSIS — D649 Anemia, unspecified: Secondary | ICD-10-CM

## 2012-06-25 LAB — CBC WITH DIFFERENTIAL/PLATELET
Basophils Absolute: 0 10*3/uL (ref 0.0–0.1)
Eosinophils Absolute: 0.1 10*3/uL (ref 0.0–0.7)
Lymphocytes Relative: 20.2 % (ref 12.0–46.0)
MCHC: 34.5 g/dL (ref 30.0–36.0)
Monocytes Relative: 9.8 % (ref 3.0–12.0)
Neutrophils Relative %: 67.8 % (ref 43.0–77.0)
RDW: 13.4 % (ref 11.5–14.6)

## 2012-06-26 ENCOUNTER — Other Ambulatory Visit: Payer: Self-pay | Admitting: *Deleted

## 2012-06-26 DIAGNOSIS — D500 Iron deficiency anemia secondary to blood loss (chronic): Secondary | ICD-10-CM

## 2012-07-02 ENCOUNTER — Other Ambulatory Visit: Payer: Self-pay | Admitting: Internal Medicine

## 2012-07-15 DIAGNOSIS — Z006 Encounter for examination for normal comparison and control in clinical research program: Secondary | ICD-10-CM | POA: Diagnosis not present

## 2012-07-15 DIAGNOSIS — D649 Anemia, unspecified: Secondary | ICD-10-CM | POA: Diagnosis not present

## 2012-07-15 DIAGNOSIS — G4700 Insomnia, unspecified: Secondary | ICD-10-CM | POA: Diagnosis not present

## 2012-07-15 DIAGNOSIS — M810 Age-related osteoporosis without current pathological fracture: Secondary | ICD-10-CM | POA: Diagnosis not present

## 2012-07-15 DIAGNOSIS — I10 Essential (primary) hypertension: Secondary | ICD-10-CM | POA: Diagnosis not present

## 2012-07-15 DIAGNOSIS — F411 Generalized anxiety disorder: Secondary | ICD-10-CM | POA: Diagnosis not present

## 2012-07-15 DIAGNOSIS — Z0000 Encounter for general adult medical examination without abnormal findings: Secondary | ICD-10-CM | POA: Diagnosis not present

## 2012-07-16 DIAGNOSIS — E7800 Pure hypercholesterolemia, unspecified: Secondary | ICD-10-CM | POA: Diagnosis not present

## 2012-07-16 DIAGNOSIS — D649 Anemia, unspecified: Secondary | ICD-10-CM | POA: Diagnosis not present

## 2012-07-16 DIAGNOSIS — M545 Low back pain: Secondary | ICD-10-CM | POA: Diagnosis not present

## 2012-07-16 DIAGNOSIS — M47817 Spondylosis without myelopathy or radiculopathy, lumbosacral region: Secondary | ICD-10-CM | POA: Diagnosis not present

## 2012-07-16 DIAGNOSIS — Z0000 Encounter for general adult medical examination without abnormal findings: Secondary | ICD-10-CM | POA: Diagnosis not present

## 2012-07-16 DIAGNOSIS — M810 Age-related osteoporosis without current pathological fracture: Secondary | ICD-10-CM | POA: Diagnosis not present

## 2012-07-31 DIAGNOSIS — D649 Anemia, unspecified: Secondary | ICD-10-CM | POA: Diagnosis not present

## 2012-07-31 DIAGNOSIS — E871 Hypo-osmolality and hyponatremia: Secondary | ICD-10-CM | POA: Diagnosis not present

## 2012-07-31 DIAGNOSIS — F411 Generalized anxiety disorder: Secondary | ICD-10-CM | POA: Diagnosis not present

## 2012-07-31 DIAGNOSIS — M810 Age-related osteoporosis without current pathological fracture: Secondary | ICD-10-CM | POA: Diagnosis not present

## 2012-08-01 DIAGNOSIS — E871 Hypo-osmolality and hyponatremia: Secondary | ICD-10-CM | POA: Diagnosis not present

## 2012-08-06 DIAGNOSIS — H409 Unspecified glaucoma: Secondary | ICD-10-CM | POA: Diagnosis not present

## 2012-08-06 DIAGNOSIS — H1810 Bullous keratopathy, unspecified eye: Secondary | ICD-10-CM | POA: Diagnosis not present

## 2012-08-06 DIAGNOSIS — H40019 Open angle with borderline findings, low risk, unspecified eye: Secondary | ICD-10-CM | POA: Diagnosis not present

## 2012-08-07 ENCOUNTER — Telehealth: Payer: Self-pay | Admitting: *Deleted

## 2012-08-07 NOTE — Telephone Encounter (Signed)
Spoke with patient and she will come for labs next week. 

## 2012-08-07 NOTE — Telephone Encounter (Signed)
Message copied by Hulan Saas on Thu Aug 07, 2012  8:30 AM ------      Message from: Hulan Saas      Created: Thu Jun 26, 2012  8:39 AM       Call and remind patient due for CBC for DB ob 08/11/12 ------

## 2012-08-07 NOTE — Telephone Encounter (Signed)
Left a message for patient to call me. 

## 2012-08-20 ENCOUNTER — Other Ambulatory Visit (INDEPENDENT_AMBULATORY_CARE_PROVIDER_SITE_OTHER): Payer: Medicare Other

## 2012-08-20 DIAGNOSIS — D500 Iron deficiency anemia secondary to blood loss (chronic): Secondary | ICD-10-CM | POA: Diagnosis not present

## 2012-08-20 LAB — CBC WITH DIFFERENTIAL/PLATELET
Basophils Absolute: 0 10*3/uL (ref 0.0–0.1)
Basophils Relative: 0.7 % (ref 0.0–3.0)
Eosinophils Relative: 2.3 % (ref 0.0–5.0)
Hemoglobin: 11.1 g/dL — ABNORMAL LOW (ref 12.0–15.0)
Lymphocytes Relative: 18.5 % (ref 12.0–46.0)
Monocytes Relative: 11.8 % (ref 3.0–12.0)
Neutro Abs: 2.5 10*3/uL (ref 1.4–7.7)
RBC: 3.39 Mil/uL — ABNORMAL LOW (ref 3.87–5.11)
WBC: 3.8 10*3/uL — ABNORMAL LOW (ref 4.5–10.5)

## 2012-08-21 ENCOUNTER — Other Ambulatory Visit: Payer: Self-pay | Admitting: *Deleted

## 2012-08-21 ENCOUNTER — Telehealth: Payer: Self-pay | Admitting: *Deleted

## 2012-08-21 DIAGNOSIS — D500 Iron deficiency anemia secondary to blood loss (chronic): Secondary | ICD-10-CM

## 2012-08-21 NOTE — Telephone Encounter (Signed)
Called and gave patient results of labs. CBC in [facility] for 10/20/12.

## 2012-09-01 DIAGNOSIS — G4700 Insomnia, unspecified: Secondary | ICD-10-CM | POA: Diagnosis not present

## 2012-09-01 DIAGNOSIS — M545 Low back pain: Secondary | ICD-10-CM | POA: Diagnosis not present

## 2012-09-05 DIAGNOSIS — D539 Nutritional anemia, unspecified: Secondary | ICD-10-CM | POA: Diagnosis not present

## 2012-09-05 DIAGNOSIS — E041 Nontoxic single thyroid nodule: Secondary | ICD-10-CM | POA: Diagnosis not present

## 2012-09-05 DIAGNOSIS — E119 Type 2 diabetes mellitus without complications: Secondary | ICD-10-CM | POA: Diagnosis not present

## 2012-09-05 DIAGNOSIS — M1990 Unspecified osteoarthritis, unspecified site: Secondary | ICD-10-CM | POA: Diagnosis not present

## 2012-09-05 DIAGNOSIS — E871 Hypo-osmolality and hyponatremia: Secondary | ICD-10-CM | POA: Diagnosis not present

## 2012-09-09 DIAGNOSIS — M159 Polyosteoarthritis, unspecified: Secondary | ICD-10-CM | POA: Diagnosis not present

## 2012-09-09 DIAGNOSIS — M25569 Pain in unspecified knee: Secondary | ICD-10-CM | POA: Diagnosis not present

## 2012-09-09 DIAGNOSIS — M545 Low back pain: Secondary | ICD-10-CM | POA: Diagnosis not present

## 2012-09-09 DIAGNOSIS — M810 Age-related osteoporosis without current pathological fracture: Secondary | ICD-10-CM | POA: Diagnosis not present

## 2012-09-16 DIAGNOSIS — E871 Hypo-osmolality and hyponatremia: Secondary | ICD-10-CM | POA: Diagnosis not present

## 2012-09-23 ENCOUNTER — Other Ambulatory Visit: Payer: Self-pay | Admitting: Internal Medicine

## 2012-10-13 DIAGNOSIS — Z23 Encounter for immunization: Secondary | ICD-10-CM | POA: Diagnosis not present

## 2012-10-13 DIAGNOSIS — E871 Hypo-osmolality and hyponatremia: Secondary | ICD-10-CM | POA: Diagnosis not present

## 2012-10-16 ENCOUNTER — Telehealth: Payer: Self-pay | Admitting: *Deleted

## 2012-10-16 NOTE — Telephone Encounter (Signed)
Message copied by Hulan Saas on Thu Oct 16, 2012  8:32 AM ------      Message from: Hulan Saas      Created: Thu Aug 21, 2012 11:01 AM       Call and remind due for CBC on 10/20/12 for DB ------

## 2012-10-16 NOTE — Telephone Encounter (Signed)
Spoke with patient and reminded her of lab work due next week.

## 2012-10-23 ENCOUNTER — Other Ambulatory Visit (INDEPENDENT_AMBULATORY_CARE_PROVIDER_SITE_OTHER): Payer: Medicare Other

## 2012-10-23 ENCOUNTER — Other Ambulatory Visit: Payer: Medicare Other

## 2012-10-23 DIAGNOSIS — D500 Iron deficiency anemia secondary to blood loss (chronic): Secondary | ICD-10-CM

## 2012-10-23 LAB — CBC WITH DIFFERENTIAL/PLATELET
Basophils Absolute: 0 10*3/uL (ref 0.0–0.1)
HCT: 31 % — ABNORMAL LOW (ref 36.0–46.0)
Hemoglobin: 10.6 g/dL — ABNORMAL LOW (ref 12.0–15.0)
Lymphs Abs: 0.9 10*3/uL (ref 0.7–4.0)
MCHC: 34.2 g/dL (ref 30.0–36.0)
MCV: 96.1 fl (ref 78.0–100.0)
Monocytes Relative: 10.4 % (ref 3.0–12.0)
Neutro Abs: 3.1 10*3/uL (ref 1.4–7.7)
RDW: 13.4 % (ref 11.5–14.6)

## 2012-10-24 ENCOUNTER — Other Ambulatory Visit: Payer: Self-pay | Admitting: *Deleted

## 2012-10-24 DIAGNOSIS — D649 Anemia, unspecified: Secondary | ICD-10-CM

## 2012-12-04 ENCOUNTER — Telehealth: Payer: Self-pay | Admitting: Internal Medicine

## 2012-12-04 DIAGNOSIS — I10 Essential (primary) hypertension: Secondary | ICD-10-CM | POA: Diagnosis not present

## 2012-12-04 DIAGNOSIS — E871 Hypo-osmolality and hyponatremia: Secondary | ICD-10-CM | POA: Diagnosis not present

## 2012-12-04 DIAGNOSIS — M545 Low back pain: Secondary | ICD-10-CM | POA: Diagnosis not present

## 2012-12-04 DIAGNOSIS — K219 Gastro-esophageal reflux disease without esophagitis: Secondary | ICD-10-CM | POA: Diagnosis not present

## 2012-12-04 NOTE — Telephone Encounter (Signed)
I have spoken to Elberta Fortis at [employer] (phone [phone]) to get an approval for patient to have Dexilant 60 mg once daily in 2015. Patient has a history of Barretts, GAVE, GI bleed, Hemorrhagic gastritis. She has tried pantoprazole in the past. Coverage determination will be made in the next 72 hours. I have left a voicemail for patient's son (who helps care for patient) to advise that in the future, we are glad to to the prior authorization for patient's medication if needed. However, Dr Olevia Perches would need to be the one to write the prescription for the Dexilant, not Dr Shelia Media (current prescriber). I did the authorization last year and this year as a courtesy since Dr Pennie Banter office did not get authorization and patient was out of medication. I will be unable to continue this though.

## 2012-12-08 MED ORDER — LANSOPRAZOLE 30 MG PO CPDR: 30.0000 mg | DELAYED_RELEASE_CAPSULE | Freq: Every day | ORAL | Status: DC

## 2012-12-08 NOTE — Telephone Encounter (Signed)
Patient has been advised that insurance will no longer cover Tw[place]gs. They want her to have tried at least 2 of the preferred alternatives which include omeprazole, lansoprazole and pantoprazole. Patient has tried all of the medications but we only have documentation of pantoprazole trial (she tried the others under a doctor who is now retired). I have sent a prescription for prevacid. Patient will let us know how that works.

## 2012-12-08 NOTE — Addendum Note (Signed)
Addended by: Larina Bras on: 12/08/2012 12:15 PM   Modules accepted: Orders, Medications

## 2012-12-10 DIAGNOSIS — H04129 Dry eye syndrome of unspecified lacrimal gland: Secondary | ICD-10-CM | POA: Diagnosis not present

## 2012-12-10 DIAGNOSIS — H40019 Open angle with borderline findings, low risk, unspecified eye: Secondary | ICD-10-CM | POA: Diagnosis not present

## 2012-12-11 ENCOUNTER — Telehealth: Payer: Self-pay | Admitting: Internal Medicine

## 2012-12-11 NOTE — Telephone Encounter (Signed)
Spoke to patient's son (caregiver). He states that [employer] company is "all screwed up" and he is working on getting things fixed. I have advised that the documentation that I have is that she has tried pantoprazole in the past and that she has a hx of GI bleed and Barretts. I am unable to get authorization for Dexilant if the patient has not tried the preferred medication. Patient states that she has tried all of the preferred medication. However, I have again advised that I am unable to tell the insurance company that she has tried these medications if I do not have proper documentation. Patient's son verbalizes understanding and will call us back if we can be of further assistance.

## 2012-12-18 ENCOUNTER — Telehealth: Payer: Self-pay | Admitting: *Deleted

## 2012-12-18 NOTE — Telephone Encounter (Signed)
Spoke with patient and she will come for labs next week. 

## 2012-12-18 NOTE — Telephone Encounter (Signed)
Message copied by Hulan Saas on Thu Dec 18, 2012  8:22 AM ------      Message from: Hulan Saas      Created: Fri Oct 24, 2012  9:00 AM       Call and remind patient due for CBC on 12/22/12 DB ------

## 2012-12-24 ENCOUNTER — Other Ambulatory Visit (INDEPENDENT_AMBULATORY_CARE_PROVIDER_SITE_OTHER): Payer: Medicare Other

## 2012-12-24 DIAGNOSIS — D649 Anemia, unspecified: Secondary | ICD-10-CM | POA: Diagnosis not present

## 2012-12-24 LAB — CBC WITH DIFFERENTIAL/PLATELET
Basophils Absolute: 0 10*3/uL (ref 0.0–0.1)
HCT: 30.7 % — ABNORMAL LOW (ref 36.0–46.0)
Lymphs Abs: 0.9 10*3/uL (ref 0.7–4.0)
Monocytes Absolute: 0.4 10*3/uL (ref 0.1–1.0)
Monocytes Relative: 11.4 % (ref 3.0–12.0)
Platelets: 238 10*3/uL (ref 150.0–400.0)
RDW: 13.2 % (ref 11.5–14.6)

## 2012-12-25 ENCOUNTER — Other Ambulatory Visit: Payer: Self-pay | Admitting: *Deleted

## 2012-12-25 DIAGNOSIS — D649 Anemia, unspecified: Secondary | ICD-10-CM

## 2012-12-29 ENCOUNTER — Other Ambulatory Visit: Payer: Self-pay | Admitting: Internal Medicine

## 2013-01-08 DIAGNOSIS — M5030 Other cervical disc degeneration, unspecified cervical region: Secondary | ICD-10-CM | POA: Diagnosis not present

## 2013-01-08 DIAGNOSIS — M19049 Primary osteoarthritis, unspecified hand: Secondary | ICD-10-CM | POA: Diagnosis not present

## 2013-01-08 DIAGNOSIS — M810 Age-related osteoporosis without current pathological fracture: Secondary | ICD-10-CM | POA: Diagnosis not present

## 2013-01-08 DIAGNOSIS — M76899 Other specified enthesopathies of unspecified lower limb, excluding foot: Secondary | ICD-10-CM | POA: Diagnosis not present

## 2013-01-08 DIAGNOSIS — M1710 Unilateral primary osteoarthritis, unspecified knee: Secondary | ICD-10-CM | POA: Diagnosis not present

## 2013-02-16 ENCOUNTER — Encounter: Payer: Self-pay | Admitting: *Deleted

## 2013-02-20 ENCOUNTER — Telehealth: Payer: Self-pay | Admitting: *Deleted

## 2013-02-20 NOTE — Telephone Encounter (Signed)
Spoke with patient and she will come for labs. 

## 2013-02-20 NOTE — Telephone Encounter (Signed)
Message copied by Hulan Saas on Fri Feb 20, 2013 10:25 AM ------      Message from: Hulan Saas      Created: Thu Dec 25, 2012  9:29 AM       Call and remind patient CBC due 02/23/13 for DB Lab in EPIC ------

## 2013-02-24 ENCOUNTER — Other Ambulatory Visit (INDEPENDENT_AMBULATORY_CARE_PROVIDER_SITE_OTHER): Payer: Medicare Other

## 2013-02-24 DIAGNOSIS — D649 Anemia, unspecified: Secondary | ICD-10-CM | POA: Diagnosis not present

## 2013-02-24 DIAGNOSIS — M810 Age-related osteoporosis without current pathological fracture: Secondary | ICD-10-CM | POA: Diagnosis not present

## 2013-02-24 LAB — CBC WITH DIFFERENTIAL/PLATELET
BASOS ABS: 0 10*3/uL (ref 0.0–0.1)
BASOS PCT: 0.5 % (ref 0.0–3.0)
EOS ABS: 0.1 10*3/uL (ref 0.0–0.7)
Eosinophils Relative: 1.7 % (ref 0.0–5.0)
HCT: 32 % — ABNORMAL LOW (ref 36.0–46.0)
HEMOGLOBIN: 10.7 g/dL — AB (ref 12.0–15.0)
LYMPHS ABS: 0.8 10*3/uL (ref 0.7–4.0)
Lymphocytes Relative: 19.3 % (ref 12.0–46.0)
MCHC: 33.6 g/dL (ref 30.0–36.0)
MCV: 94 fl (ref 78.0–100.0)
MONO ABS: 0.5 10*3/uL (ref 0.1–1.0)
Monocytes Relative: 11.6 % (ref 3.0–12.0)
NEUTROS ABS: 2.9 10*3/uL (ref 1.4–7.7)
Neutrophils Relative %: 66.9 % (ref 43.0–77.0)
Platelets: 260 10*3/uL (ref 150.0–400.0)
RBC: 3.4 Mil/uL — AB (ref 3.87–5.11)
RDW: 13.1 % (ref 11.5–14.6)
WBC: 4.4 10*3/uL — ABNORMAL LOW (ref 4.5–10.5)

## 2013-02-26 ENCOUNTER — Other Ambulatory Visit: Payer: Self-pay | Admitting: *Deleted

## 2013-02-26 DIAGNOSIS — D649 Anemia, unspecified: Secondary | ICD-10-CM

## 2013-03-17 ENCOUNTER — Encounter: Payer: Self-pay | Admitting: Internal Medicine

## 2013-03-17 ENCOUNTER — Other Ambulatory Visit: Payer: Self-pay | Admitting: Internal Medicine

## 2013-03-17 ENCOUNTER — Ambulatory Visit (INDEPENDENT_AMBULATORY_CARE_PROVIDER_SITE_OTHER): Payer: Medicare Other | Admitting: Internal Medicine

## 2013-03-17 ENCOUNTER — Other Ambulatory Visit (INDEPENDENT_AMBULATORY_CARE_PROVIDER_SITE_OTHER): Payer: Medicare Other

## 2013-03-17 VITALS — BP 116/70 | HR 64 | Ht 60.0 in | Wt 182.0 lb

## 2013-03-17 DIAGNOSIS — R6889 Other general symptoms and signs: Secondary | ICD-10-CM

## 2013-03-17 DIAGNOSIS — D509 Iron deficiency anemia, unspecified: Secondary | ICD-10-CM

## 2013-03-17 DIAGNOSIS — D649 Anemia, unspecified: Secondary | ICD-10-CM | POA: Diagnosis not present

## 2013-03-17 DIAGNOSIS — K31811 Angiodysplasia of stomach and duodenum with bleeding: Secondary | ICD-10-CM | POA: Diagnosis not present

## 2013-03-17 LAB — IBC PANEL
Iron: 35 ug/dL — ABNORMAL LOW (ref 42–145)
Saturation Ratios: 8.2 % — ABNORMAL LOW (ref 20.0–50.0)
TRANSFERRIN: 304.9 mg/dL (ref 212.0–360.0)

## 2013-03-17 LAB — VITAMIN B12: Vitamin B-12: >1500 pg/mL — ABNORMAL HIGH (ref 211–911)

## 2013-03-17 MED ORDER — DEXLANSOPRAZOLE 60 MG PO CPDR: 60.0000 mg | DELAYED_RELEASE_CAPSULE | Freq: Every day | ORAL | Status: DC

## 2013-03-17 NOTE — Progress Notes (Signed)
Sophia [name] 13-Feb-1928 [id number]  Note: This dictation was prepared with Dragon digital system. Any transcriptional errors that result from this procedure are unintentional.   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 Patient also has chronic iron deficiency anemia. She has been doing well on iron supplements and iron infusions. Prior infusions took place in March 2012, July 2013 and the most recent one in January 2014. She has been getting hemoglobin checks every 2 months. Since last March 2014, her hemoglobin values were 9.4, 9.9, 11.0, 11.1, 10.6 10.4  and 10.7. She is feeling well. An upper endoscopy and Argon Plasma  coagulation of AVMs in March 2014 resulted in a stable hematological picture.    Past Medical History  Diagnosis Date  . Congestive heart failure   . Aortic stenosis   . Hypertension   . Dyslipidemia   . Depression   . Duodenitis 03/15/10    per capsule endoscopy report  . Hemorrhagic gastritis 03/15/10    per capsule endoscopy report  . Diverticulosis 03/14/10  . Hyperplastic colon polyp   . Anemia   . DVT (deep venous thrombosis)   . Anxiety   . Arthritis   . Blood transfusion   . Cataracts, bilateral     removed  . GERD (gastroesophageal reflux disease)   . Heart murmur   . Hyperlipidemia   . Hiatal hernia   . Osteoporosis   . Neuropathy     bil big toes  . Presbyesophagus     Past Surgical History  Procedure Laterality Date  . Cholecystectomy    . Orif wrist fracture    . Fracture left knee  1993    x 2   . Foot surgery  1998  . Tubal ligation    . Plantar fasciitis repair      left  . Cataract extraction, bilateral    . Esophagogastroduodenoscopy N/A 04/28/2012    Procedure: ESOPHAGOGASTRODUODENOSCOPY (EGD);  Surgeon: Lafayette Dragon, MD;  Location: [facility];  Service: Endoscopy;  Laterality: N/A;  . Hot hemostasis N/A 04/28/2012     Procedure: HOT HEMOSTASIS (ARGON PLASMA COAGULATION/BICAP);  Surgeon: Lafayette Dragon, MD;  Location: [facility];  Service: Endoscopy;  Laterality: N/A;    Allergies  Allergen Reactions  . Nutritional Supplements Anaphylaxis  . [name] Shortness Of Breath  . Sertraline Shortness Of Breath  . Lipitor [Atorvastatin] Other (See Comments)    Muscle aches  . Pregabalin Nausea And Vomiting  . [name] Nausea And Vomiting    Family history and social history have been reviewed.  Review of Systems:   The remainder of the 10 point ROS is negative except as outlined in the H&P  Physical Exam: General Appearance Well developed, in no distress, ambulates with walker Eyes  Non icteric  HEENT  Non traumatic, normocephalic  Mouth No lesion, tongue papillated, no cheilosis Neck Supple without adenopathy, thyroid not enlarged,  2+ carotid bruits, no JVD Lungs Clear to auscultation bilaterally COR Normal S1, normal S2, regular rhythm, 2/6 systolic ejection murmur, quiet precordium Abdomen soft nontender Rectal not done Extremities  trace bilateral pedal edema Skin No lesions Neurological Alert and oriented x 3 Psychological Normal mood and affect  Assessment and Plan:   Problem #1 Stable chronic GI blood loss. Patient is maintaining her hemoglobin around 10 g on oral iron supplements. We will check her B12 and iron  levels today. Her last iron infusion was 1 year ago. We will refill  Dexilant 60 mg daily. I will see her in one year. We are changing her hemoglobin checks to every 3 months. Problem #2 AS- followed by cardiology    [name] 03/17/2013

## 2013-03-17 NOTE — Patient Instructions (Signed)
Your physician has requested that you go to the basement for the following lab work before leaving today: B12, IBC  You should have your CBC drawn every 3 months now instead of every 3 months.  We have sent the following medications to your pharmacy for you to pick up at your convenience: Dexilant   Cc: Dr [name]

## 2013-03-18 ENCOUNTER — Other Ambulatory Visit: Payer: Self-pay | Admitting: *Deleted

## 2013-03-18 DIAGNOSIS — D509 Iron deficiency anemia, unspecified: Secondary | ICD-10-CM

## 2013-03-30 ENCOUNTER — Encounter (HOSPITAL_COMMUNITY)
Admission: RE | Admit: 2013-03-30 | Discharge: 2013-03-30 | Disposition: A | Discharge status: Home / Self Care | Payer: Medicare Other | Source: Ambulatory Visit | Attending: Internal Medicine | Admitting: Internal Medicine

## 2013-03-30 ENCOUNTER — Encounter (HOSPITAL_COMMUNITY): Payer: Self-pay

## 2013-03-30 VITALS — BP 146/75 | HR 71 | Temp 97.9°F | Resp 16 | Ht 60.0 in | Wt 182.0 lb

## 2013-03-30 DIAGNOSIS — D509 Iron deficiency anemia, unspecified: Secondary | ICD-10-CM

## 2013-03-30 MED ORDER — IRON DEXTRAN 50 MG/ML IJ SOLN
1000.0000 mg | Freq: Once | INTRAMUSCULAR | Status: AC
  Administered 2013-03-30: 1000 mg via INTRAVENOUS
  Filled 2013-03-30: qty 20

## 2013-03-30 MED ORDER — SODIUM CHLORIDE 0.9 % IV SOLN
INTRAVENOUS | Status: DC
  Administered 2013-03-30: 08:00:00 via INTRAVENOUS

## 2013-03-30 NOTE — Progress Notes (Signed)
Tolerated Infed well. No signs of adverse reaction noted. Instructed to call Dr. Nichola Sizer office for concerns following discharge from Short Stay. Verbalized understanding.

## 2013-03-30 NOTE — Discharge Instructions (Signed)

## 2013-04-23 ENCOUNTER — Telehealth: Payer: Self-pay | Admitting: *Deleted

## 2013-04-23 NOTE — Telephone Encounter (Signed)
Patient will come for labs.  

## 2013-04-23 NOTE — Telephone Encounter (Signed)
Message copied by Hulan Saas on Thu Apr 23, 2013  9:44 AM ------      Message from: Hulan Saas      Created: Thu Feb 26, 2013  8:28 AM       Call and remind patient due for CBC (DB) on 04/27/13. Lab in EPIC. ------

## 2013-05-04 ENCOUNTER — Other Ambulatory Visit (INDEPENDENT_AMBULATORY_CARE_PROVIDER_SITE_OTHER): Payer: Medicare Other

## 2013-05-04 DIAGNOSIS — D509 Iron deficiency anemia, unspecified: Secondary | ICD-10-CM | POA: Diagnosis not present

## 2013-05-04 LAB — IBC PANEL
Iron: 162 ug/dL — ABNORMAL HIGH (ref 42–145)
SATURATION RATIOS: 44.4 % (ref 20.0–50.0)
Transferrin: 260.5 mg/dL (ref 212.0–360.0)

## 2013-05-05 ENCOUNTER — Other Ambulatory Visit: Payer: Self-pay | Admitting: *Deleted

## 2013-05-05 DIAGNOSIS — D649 Anemia, unspecified: Secondary | ICD-10-CM

## 2013-05-05 LAB — CBC WITH DIFFERENTIAL/PLATELET
BASOS PCT: 0.4 % (ref 0.0–3.0)
Basophils Absolute: 0 10*3/uL (ref 0.0–0.1)
EOS ABS: 0 10*3/uL (ref 0.0–0.7)
Eosinophils Relative: 1 % (ref 0.0–5.0)
HEMATOCRIT: 29.8 % — AB (ref 36.0–46.0)
HEMOGLOBIN: 10 g/dL — AB (ref 12.0–15.0)
LYMPHS ABS: 0.8 10*3/uL (ref 0.7–4.0)
LYMPHS PCT: 16.6 % (ref 12.0–46.0)
MCHC: 33.4 g/dL (ref 30.0–36.0)
MCV: 100 fl (ref 78.0–100.0)
Monocytes Absolute: 0.4 10*3/uL (ref 0.1–1.0)
Monocytes Relative: 8 % (ref 3.0–12.0)
NEUTROS ABS: 3.4 10*3/uL (ref 1.4–7.7)
Neutrophils Relative %: 74 % (ref 43.0–77.0)
Platelets: 276 10*3/uL (ref 150.0–400.0)
RBC: 2.98 Mil/uL — AB (ref 3.87–5.11)
RDW: 15 % — AB (ref 11.5–14.6)
WBC: 4.7 10*3/uL (ref 4.5–10.5)

## 2013-05-21 ENCOUNTER — Other Ambulatory Visit: Payer: Self-pay | Admitting: Internal Medicine

## 2013-06-03 DIAGNOSIS — H40019 Open angle with borderline findings, low risk, unspecified eye: Secondary | ICD-10-CM | POA: Diagnosis not present

## 2013-06-03 DIAGNOSIS — H353190 Nonexudative age-related macular degeneration, unspecified eye, stage unspecified: Secondary | ICD-10-CM | POA: Diagnosis not present

## 2013-06-03 DIAGNOSIS — Z961 Presence of intraocular lens: Secondary | ICD-10-CM | POA: Diagnosis not present

## 2013-06-03 DIAGNOSIS — H04129 Dry eye syndrome of unspecified lacrimal gland: Secondary | ICD-10-CM | POA: Diagnosis not present

## 2013-07-30 ENCOUNTER — Telehealth: Payer: Self-pay | Admitting: *Deleted

## 2013-07-30 NOTE — Telephone Encounter (Signed)
Message copied by Hulan Saas on Thu Jul 30, 2013  9:58 AM ------      Message from: Hulan Saas      Created: Tue May 05, 2013  2:08 PM       Call and remind patient due for CBC, iron panel for DB on 08/03/13 ------

## 2013-07-30 NOTE — Telephone Encounter (Signed)
Patient notified and she will come for labs. 

## 2013-08-10 ENCOUNTER — Other Ambulatory Visit (INDEPENDENT_AMBULATORY_CARE_PROVIDER_SITE_OTHER): Payer: Medicare Other

## 2013-08-10 DIAGNOSIS — D649 Anemia, unspecified: Secondary | ICD-10-CM | POA: Diagnosis not present

## 2013-08-10 LAB — IBC PANEL
Iron: 346 ug/dL — ABNORMAL HIGH (ref 42–145)
SATURATION RATIOS: 83.4 % — AB (ref 20.0–50.0)
Transferrin: 296.5 mg/dL (ref 212.0–360.0)

## 2013-08-11 ENCOUNTER — Other Ambulatory Visit: Payer: Self-pay | Admitting: *Deleted

## 2013-08-11 ENCOUNTER — Telehealth: Payer: Self-pay | Admitting: Internal Medicine

## 2013-08-11 DIAGNOSIS — R718 Other abnormality of red blood cells: Secondary | ICD-10-CM

## 2013-08-11 DIAGNOSIS — D509 Iron deficiency anemia, unspecified: Secondary | ICD-10-CM

## 2013-08-11 DIAGNOSIS — E7800 Pure hypercholesterolemia, unspecified: Secondary | ICD-10-CM | POA: Diagnosis not present

## 2013-08-11 DIAGNOSIS — D649 Anemia, unspecified: Secondary | ICD-10-CM | POA: Diagnosis not present

## 2013-08-11 DIAGNOSIS — I10 Essential (primary) hypertension: Secondary | ICD-10-CM | POA: Diagnosis not present

## 2013-08-11 DIAGNOSIS — Z23 Encounter for immunization: Secondary | ICD-10-CM | POA: Diagnosis not present

## 2013-08-11 DIAGNOSIS — G609 Hereditary and idiopathic neuropathy, unspecified: Secondary | ICD-10-CM | POA: Diagnosis not present

## 2013-08-11 DIAGNOSIS — D508 Other iron deficiency anemias: Secondary | ICD-10-CM

## 2013-08-11 DIAGNOSIS — K2941 Chronic atrophic gastritis with bleeding: Secondary | ICD-10-CM | POA: Diagnosis not present

## 2013-08-11 DIAGNOSIS — Z0000 Encounter for general adult medical examination without abnormal findings: Secondary | ICD-10-CM | POA: Diagnosis not present

## 2013-08-11 DIAGNOSIS — D72819 Decreased white blood cell count, unspecified: Secondary | ICD-10-CM

## 2013-08-11 DIAGNOSIS — D539 Nutritional anemia, unspecified: Secondary | ICD-10-CM

## 2013-08-12 ENCOUNTER — Telehealth: Payer: Self-pay | Admitting: Hematology and Oncology

## 2013-08-12 NOTE — Telephone Encounter (Signed)
S/W PATIENT AND GAVE NP APPT FOR 07/20 @ 11 W/DR. [place].  REFERRING DR. DORA BRODIE DX- ANEMIA; MACROCYTIC

## 2013-08-12 NOTE — Telephone Encounter (Signed)
Labs faxed from Dr. Pennie Banter office and Dr. Olevia Perches reviewed them. Per Dr. Olevia Perches, patient needs to be referred to hematology non iron def. Anemia, macrocytic anemia and ultrasound abdomen r/o enlarged spleen, liver. Spoke with patient's son and gave him recommendations. Referral made to hematology.Tiffany at [facility] notified and she will schedule patient. Ultrasound scheduled at [facility] radiology([place]) on 08/18/13 at 8:30 AM. NPO 6 hours prior. Patient notified of appointment date, time and instructions.

## 2013-08-13 DIAGNOSIS — H40019 Open angle with borderline findings, low risk, unspecified eye: Secondary | ICD-10-CM | POA: Diagnosis not present

## 2013-08-13 DIAGNOSIS — H18519 Endothelial corneal dystrophy, unspecified eye: Secondary | ICD-10-CM | POA: Diagnosis not present

## 2013-08-17 DIAGNOSIS — E7800 Pure hypercholesterolemia, unspecified: Secondary | ICD-10-CM | POA: Diagnosis not present

## 2013-08-17 DIAGNOSIS — E875 Hyperkalemia: Secondary | ICD-10-CM | POA: Diagnosis not present

## 2013-08-17 DIAGNOSIS — R5381 Other malaise: Secondary | ICD-10-CM | POA: Diagnosis not present

## 2013-08-17 DIAGNOSIS — D649 Anemia, unspecified: Secondary | ICD-10-CM | POA: Diagnosis not present

## 2013-08-17 DIAGNOSIS — M810 Age-related osteoporosis without current pathological fracture: Secondary | ICD-10-CM | POA: Diagnosis not present

## 2013-08-17 DIAGNOSIS — R5383 Other fatigue: Secondary | ICD-10-CM | POA: Diagnosis not present

## 2013-08-18 ENCOUNTER — Ambulatory Visit (HOSPITAL_COMMUNITY)
Admission: RE | Admit: 2013-08-18 | Discharge: 2013-08-18 | Disposition: A | Discharge status: Home / Self Care | Payer: Medicare Other | Source: Ambulatory Visit | Attending: Internal Medicine | Admitting: Internal Medicine

## 2013-08-18 DIAGNOSIS — D539 Nutritional anemia, unspecified: Secondary | ICD-10-CM

## 2013-08-18 DIAGNOSIS — D72818 Other decreased white blood cell count: Secondary | ICD-10-CM | POA: Insufficient documentation

## 2013-08-18 DIAGNOSIS — D649 Anemia, unspecified: Secondary | ICD-10-CM | POA: Diagnosis not present

## 2013-08-18 DIAGNOSIS — R718 Other abnormality of red blood cells: Secondary | ICD-10-CM

## 2013-08-18 DIAGNOSIS — N281 Cyst of kidney, acquired: Secondary | ICD-10-CM | POA: Insufficient documentation

## 2013-08-18 DIAGNOSIS — D72819 Decreased white blood cell count, unspecified: Secondary | ICD-10-CM

## 2013-08-19 ENCOUNTER — Other Ambulatory Visit: Payer: Self-pay | Admitting: Internal Medicine

## 2013-08-24 ENCOUNTER — Encounter: Payer: Self-pay | Admitting: Hematology and Oncology

## 2013-08-24 ENCOUNTER — Ambulatory Visit (HOSPITAL_BASED_OUTPATIENT_CLINIC_OR_DEPARTMENT_OTHER): Payer: Medicare Other | Admitting: Hematology and Oncology

## 2013-08-24 ENCOUNTER — Other Ambulatory Visit (HOSPITAL_BASED_OUTPATIENT_CLINIC_OR_DEPARTMENT_OTHER): Payer: Medicare Other

## 2013-08-24 ENCOUNTER — Telehealth: Payer: Self-pay | Admitting: *Deleted

## 2013-08-24 ENCOUNTER — Ambulatory Visit: Payer: Medicare Other

## 2013-08-24 ENCOUNTER — Telehealth: Payer: Self-pay | Admitting: Hematology and Oncology

## 2013-08-24 VITALS — BP 102/40 | HR 73 | Temp 98.3°F | Resp 18 | Ht 60.0 in | Wt 187.9 lb

## 2013-08-24 DIAGNOSIS — D539 Nutritional anemia, unspecified: Secondary | ICD-10-CM | POA: Diagnosis not present

## 2013-08-24 DIAGNOSIS — D509 Iron deficiency anemia, unspecified: Secondary | ICD-10-CM | POA: Diagnosis not present

## 2013-08-24 DIAGNOSIS — D508 Other iron deficiency anemias: Secondary | ICD-10-CM

## 2013-08-24 DIAGNOSIS — K31811 Angiodysplasia of stomach and duodenum with bleeding: Secondary | ICD-10-CM

## 2013-08-24 DIAGNOSIS — Z86718 Personal history of other venous thrombosis and embolism: Secondary | ICD-10-CM | POA: Diagnosis not present

## 2013-08-24 DIAGNOSIS — K2950 Unspecified chronic gastritis without bleeding: Secondary | ICD-10-CM

## 2013-08-24 HISTORY — DX: Nutritional anemia, unspecified: D53.9

## 2013-08-24 LAB — CBC & DIFF AND RETIC
BASO%: 0.8 % (ref 0.0–2.0)
BASOS ABS: 0 10*3/uL (ref 0.0–0.1)
EOS ABS: 0.1 10*3/uL (ref 0.0–0.5)
EOS%: 2.4 % (ref 0.0–7.0)
HEMATOCRIT: 25.3 % — AB (ref 34.8–46.6)
HEMOGLOBIN: 7.9 g/dL — AB (ref 11.6–15.9)
IMMATURE RETIC FRACT: 14.2 % — AB (ref 1.60–10.00)
LYMPH%: 15.4 % (ref 14.0–49.7)
MCH: 29.6 pg (ref 25.1–34.0)
MCHC: 31.3 g/dL — AB (ref 31.5–36.0)
MCV: 94.4 fL (ref 79.5–101.0)
MONO#: 0.4 10*3/uL (ref 0.1–0.9)
MONO%: 9.7 % (ref 0.0–14.0)
NEUT#: 2.7 10*3/uL (ref 1.5–6.5)
NEUT%: 71.7 % (ref 38.4–76.8)
PLATELETS: 257 10*3/uL (ref 145–400)
RBC: 2.68 10*6/uL — ABNORMAL LOW (ref 3.70–5.45)
RDW: 13.7 % (ref 11.2–14.5)
Retic %: 1.62 % (ref 0.70–2.10)
Retic Ct Abs: 43.42 10*3/uL (ref 33.70–90.70)
WBC: 3.7 10*3/uL — AB (ref 3.9–10.3)
lymph#: 0.6 10*3/uL — ABNORMAL LOW (ref 0.9–3.3)
nRBC: 0 % (ref 0–0)

## 2013-08-24 LAB — MORPHOLOGY: PLT EST: ADEQUATE

## 2013-08-24 LAB — COMPREHENSIVE METABOLIC PANEL (CC13)
ALBUMIN: 3.7 g/dL (ref 3.5–5.0)
ALK PHOS: 47 U/L (ref 40–150)
ALT: 8 U/L (ref 0–55)
AST: 15 U/L (ref 5–34)
Anion Gap: 7 mEq/L (ref 3–11)
BUN: 15.8 mg/dL (ref 7.0–26.0)
CO2: 24 mEq/L (ref 22–29)
Calcium: 8.9 mg/dL (ref 8.4–10.4)
Chloride: 100 mEq/L (ref 98–109)
Creatinine: 1.1 mg/dL (ref 0.6–1.1)
GLUCOSE: 88 mg/dL (ref 70–140)
POTASSIUM: 4.7 meq/L (ref 3.5–5.1)
SODIUM: 131 meq/L — AB (ref 136–145)
TOTAL PROTEIN: 6.5 g/dL (ref 6.4–8.3)
Total Bilirubin: 0.23 mg/dL (ref 0.20–1.20)

## 2013-08-24 LAB — IRON AND TIBC CHCC
%SAT: 5 % — ABNORMAL LOW (ref 21–57)
IRON: 19 ug/dL — AB (ref 41–142)
TIBC: 383 ug/dL (ref 236–444)
UIBC: 364 ug/dL (ref 120–384)

## 2013-08-24 LAB — CHCC SMEAR

## 2013-08-24 LAB — FERRITIN CHCC: FERRITIN: 17 ng/mL (ref 9–269)

## 2013-08-24 LAB — LACTATE DEHYDROGENASE (CC13): LDH: 163 U/L (ref 125–245)

## 2013-08-24 LAB — HOLD TUBE, BLOOD BANK

## 2013-08-24 NOTE — Progress Notes (Signed)
Checked in new patient with no financial issues prior to seeing the dr. She has not been out of the country and she has her appt card.

## 2013-08-24 NOTE — Progress Notes (Signed)
[facility] NOTE  Patient Care Team: Horatio Pel, MD as PCP - General (Internal Medicine)  CHIEF COMPLAINTS/PURPOSE OF CONSULTATION:  Chronic anemia, iron deficiency likely due to chronic GI bleed HISTORY OF PRESENTING ILLNESS:  Sophia [name] 80 y.o. female is here because of chronic anemia.  She was found to have abnormal CBC from recent blood count monitoring. The patient have chronic iron deficiency anemia due to recurrent GI bleed. 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 She had received intravenous iron infusions in the past but that was discontinued when her iron studies came back high. She was placed on oral iron supplement for a while.  She denies recent chest pain on exertion, shortness of breath on minimal exertion, pre-syncopal episodes, or palpitations. she complained of feeling weak and fatigued.  She had not noticed any recent bleeding such as epistaxis, hematuria or hematochezia The patient denies over the counter NSAID ingestion. She is not on antiplatelets agents.  She had no prior history or diagnosis of cancer. Her age appropriate screening programs are up-to-date. She denies any pica and eats a variety of diet. She never donated blood.   She denies prior iron infusion or transfusion reaction. She complained of poor sleep.  MEDICAL HISTORY:  Past Medical History  Diagnosis Date  . Congestive heart failure   . Aortic stenosis   . Hypertension   . Dyslipidemia   . Depression   . Duodenitis 03/15/10    per capsule endoscopy report  . Hemorrhagic gastritis 03/15/10    per capsule endoscopy report  . Diverticulosis 03/14/10  . Hyperplastic colon polyp   . Anemia   . DVT (deep venous thrombosis)   . Anxiety   . Arthritis   . Blood transfusion   . Cataracts, bilateral     removed  . GERD (gastroesophageal reflux disease)   . Heart murmur   . Hyperlipidemia   . Hiatal hernia   .  Osteoporosis   . Neuropathy     bil big toes  . Presbyesophagus   . Unspecified deficiency anemia 08/24/2013    SURGICAL HISTORY: Past Surgical History  Procedure Laterality Date  . Cholecystectomy    . Orif wrist fracture    . Fracture left knee  1993    x 2   . Foot surgery  1998  . Tubal ligation    . Plantar fasciitis repair      left  . Cataract extraction, bilateral    . Esophagogastroduodenoscopy N/A 04/28/2012    Procedure: ESOPHAGOGASTRODUODENOSCOPY (EGD);  Surgeon: Lafayette Dragon, MD;  Location: [facility];  Service: Endoscopy;  Laterality: N/A;  . Hot hemostasis N/A 04/28/2012    Procedure: HOT HEMOSTASIS (ARGON PLASMA COAGULATION/BICAP);  Surgeon: Lafayette Dragon, MD;  Location: [facility];  Service: Endoscopy;  Laterality: N/A;    SOCIAL HISTORY: History   Social History  . Marital Status: Widowed    Spouse Name: N/A    Number of Children: N/A  . Years of Education: N/A   Occupational History  . retired    Social History Main Topics  . Smoking status: Former Smoker    Quit date: 01/08/1989  . Smokeless tobacco: Never Used  . Alcohol Use: No  . Drug Use: No  . Sexual Activity: No   Other Topics Concern  . Not on file   Social History Narrative  . No narrative on file    FAMILY HISTORY: Family History  Problem Relation Age of Onset  . Uterine cancer Sister     Spread to colon   . Cancer Father     prostate ca  . Cancer Brother     kidney ca    ALLERGIES:  is allergic to nutritional supplements; risedronate sodium; sertraline; fosamax ; lipitor; pregabalin; and tolterodine tartrate.  MEDICATIONS:  Current Outpatient Prescriptions  Medication Sig Dispense Refill  . ALPRAZolam (XANAX) 0.5 MG tablet 1/2- 1 tab by mouth three times a day as needed      . Artificial Tear Solution SOLN Place 1 each into the right eye daily.      [name] buPROPion (WELLBUTRIN SR) 150 MG 12 hr tablet Take 150 mg by mouth 2 (two) times daily.      . calcium  citrate-vitamin D (CITRACAL+D) 315-200 MG-UNIT per tablet 1 tablet. Take 3 tablets by mouth once daily      . Cholecalciferol (VITAMIN D3) 1000 UNITS CAPS Take by mouth. Take 1 tablet by mouth once daily       . denosumab (PROLIA) 60 MG/ML SOLN Inject 60 mg into the skin once. One shot every 6 months.       . dexlansoprazole (DEXILANT) 60 MG capsule Take 1 capsule (60 mg total) by mouth daily.  90 capsule  1  . EPINEPHrine (EPIPEN JR) 0.15 MG/0.3ML injection Inject 0.3 mg into the muscle as needed for anaphylaxis.      . fluorometholone (FML) 0.1 % ophthalmic suspension       . FLUOROMETHOLONE ACETATE OP Place 1 drop into the left eye 3 (three) times daily.      . furosemide (LASIX) 40 MG tablet Take 40 mg by mouth daily.      [name] gabapentin (NEURONTIN) 400 MG tablet Take 400 mg by mouth 2 (two) times daily.       [name] HYDROcodone-acetaminophen (NORCO/VICODIN) 5-325 MG per tablet Take 1 tablet by mouth every 6 (six) hours as needed for pain.      . traZODone (DESYREL) 50 MG tablet       . verapamil (VERELAN PM) 240 MG 24 hr capsule Take 1 tablet by mouth daily      . dexlansoprazole (DEXILANT) 60 MG capsule Take 1 capsule (60 mg total) by mouth daily.  20 capsule  0  . ferrous sulfate 325 (65 FE) MG tablet TAKE 1 TABLET BY MOUTH 3 TIMES A DAY  90 tablet  2  . iron dextran complex (INFED) 50 MG/ML injection Iron infusion- Infed infustion over 4 hours of pharmacy calculated dose. No test dose needed. Weight-187 lbs Height - 5'.  Pt took 09-2011       No current facility-administered medications for this visit.    REVIEW OF SYSTEMS:   Constitutional: Denies fevers, chills or abnormal night sweats Eyes: Denies blurriness of vision, double vision or watery eyes Ears, nose, mouth, throat, [name], [name], [name], heartburn or  change in bowel habits Skin: Denies abnormal skin rashes Lymphatics: Denies new lymphadenopathy or easy bruising Neurological:Denies numbness, [name], no new changes  All other systems were reviewed with the patient and are negative.  PHYSICAL EXAMINATION: ECOG PERFORMANCE STATUS: 1 - Symptomatic but completely ambulatory  Filed Vitals:   08/24/13 1119  BP: 102/40  Pulse: 73  Temp: 98.3 F (36.8 C)  Resp: 18  Filed Weights   08/24/13 1119  Weight: 187 lb 14.4 oz (85.231 kg)    GENERAL:alert, [name] is pale, texture, turgor are normal, [name], conjunctiva are  pale  and non-injected, sclera clear OROPHARYNX:no exudate, no erythema and lips, buccal mucosa, and tongue normal  NECK: supple, thyroid normal size, non-tender, without nodularity LYMPH:  no palpable lymphadenopathy in the cervical, axillary or inguinal LUNGS: clear to auscultation and percussion with normal breathing effort HEART: regular rate & rhythm  with systolic murmur on the left sternal border, without lower extremity edema ABDOMEN:abdomen soft, non-tender and normal bowel sounds Musculoskeletal:no cyanosis of digits and no clubbing  PSYCH: alert & oriented x 3 with fluent speech NEURO: no focal motor/sensory deficits  LABORATORY DATA:  I have reviewed the data as listed Recent Results (from the past 2160 hour(s))  IBC PANEL     Status: Abnormal   Collection Time    08/10/13 12:07 PM      Result Value Ref Range   Iron 346 (*) 42 - 145 ug/dL   Transferrin 296.5  212.0 - 360.0 mg/dL   Saturation Ratios 83.4 (*) 20.0 - 50.0 %  CBC & DIFF AND RETIC     Status: Abnormal   Collection Time    08/24/13 12:18 PM      Result Value Ref Range   WBC 3.7 (*) 3.9 - 10.3 10e3/uL   NEUT# 2.7  1.5 - 6.5 10e3/uL   HGB 7.9 (*) 11.6 - 15.9 g/dL   HCT 25.3 (*) 34.8 - 46.6 %   Platelets 257   145 - 400 10e3/uL   MCV 94.4  79.5 - 101.0 fL   MCH 29.6  25.1 - 34.0 pg   MCHC 31.3 (*) 31.5 - 36.0 g/dL   RBC 2.68 (*) 3.70 - 5.45 10e6/uL   RDW 13.7  11.2 - 14.5 %   lymph# 0.6 (*) 0.9 - 3.3 10e3/uL   MONO# 0.4  0.1 - 0.9 10e3/uL   Eosinophils Absolute 0.1  0.0 - 0.5 10e3/uL   Basophils Absolute 0.0  0.0 - 0.1 10e3/uL   NEUT% 71.7  38.4 - 76.8 %   LYMPH% 15.4  14.0 - 49.7 %   MONO% 9.7  0.0 - 14.0 %   EOS% 2.4  0.0 - 7.0 %   BASO% 0.8  0.0 - 2.0 %   nRBC 0  0 - 0 %   Retic % 1.62  0.70 - 2.10 %   Retic Ct Abs 43.42  33.70 - 90.70 10e3/uL   Immature Retic Fract 14.20 (*) 1.60 - 10.00 %  FERRITIN [facility]     Status: None   Collection Time    08/24/13 12:18 PM      Result Value Ref Range   Ferritin 17  9 - 269 ng/ml  HOLD TUBE, BLOOD BANK     Status: None   Collection Time    08/24/13 12:18 PM      Result Value Ref Range   Hold Tube, Blood Bank Blood Bank Order Cancelled per Tammy    IRON AND TIBC [facility]     Status: Abnormal   Collection Time    08/24/13 12:18 PM      Result Value Ref Range   Iron 19 (*) 41 - 142 ug/dL   TIBC 383  236 - 444 ug/dL   UIBC 364  120 - 384 ug/dL   %SAT 5 (*) 21 -  57 %  [facility] SMEAR     Status: None   Collection Time    08/24/13 12:18 PM      Result Value Ref Range   Smear Result Smear Available    MORPHOLOGY     Status: None   Collection Time    08/24/13 12:18 PM      Result Value Ref Range   Tear Drop Cells Occ  Negative   Ovalocytes Occ  Negative   White Cell Comments C/W auto diff     PLT EST Adequate  Adequate  LACTATE DEHYDROGENASE (CC13)     Status: None   Collection Time    08/24/13 12:18 PM      Result Value Ref Range   LDH 163  125 - 245 U/L  COMPREHENSIVE METABOLIC PANEL (ST41)     Status: Abnormal   Collection Time    08/24/13 12:19 PM      Result Value Ref Range   Sodium 131 (*) 136 - 145 mEq/L   Potassium 4.7  3.5 - 5.1 mEq/L   Chloride 100  98 - 109 mEq/L   CO2 24  22 - 29 mEq/L   Glucose 88  70 - 140 mg/dl   BUN 15.8   7.0 - 26.0 mg/dL   Creatinine 1.1  0.6 - 1.1 mg/dL   Total Bilirubin 0.23  0.20 - 1.20 mg/dL   Alkaline Phosphatase 47  40 - 150 U/L   AST 15  5 - 34 U/L   ALT 8  0 - 55 U/L   Total Protein 6.5  6.4 - 8.3 g/dL   Albumin 3.7  3.5 - 5.0 g/dL   Calcium 8.9  8.4 - 10.4 mg/dL   Anion Gap 7  3 - 11 mEq/L   ASSESSMENT & PLAN:  Unspecified deficiency anemia I suspect this is due to chronic blood loss although with her age, myelodysplastic syndrome cannot be excluded. I am waiting for iron studies today. If her ferritin is low, we'll proceed with intravenous iron infusion. However, if iron studies were adequate, we will proceed with unsedated bone marrow aspirate and biopsy. I discussed with her in great detail about the procedure of the bone marrow biopsy and iron infusion and she agreed to proceed with the plan of care. In the meantime, I recommend folic acid supplementation. The most likely cause of her anemia is due to chronic blood loss. We discussed some of the risks, benefits, and alternatives of intravenous iron infusions. The patient is symptomatic from anemia and the iron level is critically low. She tolerated oral iron supplement poorly and desires to achieved higher levels of iron faster for adequate hematopoesis. Some of the side-effects to be expected including risks of infusion reactions, phlebitis, headaches, nausea and fatigue.  The patient is willing to proceed. Patient education material was dispensed.  Goal is to keep ferritin level greater than 50   Chronic gastritis She had recurrent GI bleed from GAVE. She is on high-dose proton pump inhibitor for this.      All questions were answered. The patient knows to call the clinic with any problems, questions or concerns. I spent 40 minutes counseling the patient face to face. The total time spent in the appointment was 55 minutes and more than 50% was on counseling.     [facility], [place], MD 08/24/2013 2:49 PM

## 2013-08-24 NOTE — Telephone Encounter (Signed)
Patient's son notified to bring patient in for iron infusion on Tuesday at 0800

## 2013-08-24 NOTE — Telephone Encounter (Signed)
Pt confirmed labs/ov per 07/20 POF, gave pt AVS....KJ °

## 2013-08-24 NOTE — Assessment & Plan Note (Addendum)
I suspect this is due to chronic blood loss although with her age, myelodysplastic syndrome cannot be excluded. I am waiting for iron studies today. If her ferritin is low, we'll proceed with intravenous iron infusion. However, if iron studies were adequate, we will proceed with unsedated bone marrow aspirate and biopsy. I discussed with her in great detail about the procedure of the bone marrow biopsy and iron infusion and she agreed to proceed with the plan of care. In the meantime, I recommend folic acid supplementation. The most likely cause of her anemia is due to chronic blood loss. We discussed some of the risks, benefits, and alternatives of intravenous iron infusions. The patient is symptomatic from anemia and the iron level is critically low. She tolerated oral iron supplement poorly and desires to achieved higher levels of iron faster for adequate hematopoesis. Some of the side-effects to be expected including risks of infusion reactions, phlebitis, headaches, nausea and fatigue.  The patient is willing to proceed. Patient education material was dispensed.  Goal is to keep ferritin level greater than 50

## 2013-08-24 NOTE — Assessment & Plan Note (Signed)
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 She is on high-dose proton pump inhibitor for this.

## 2013-08-25 ENCOUNTER — Ambulatory Visit (HOSPITAL_BASED_OUTPATIENT_CLINIC_OR_DEPARTMENT_OTHER): Payer: Medicare Other

## 2013-08-25 VITALS — BP 103/40 | HR 67 | Temp 98.4°F | Resp 16

## 2013-08-25 DIAGNOSIS — D539 Nutritional anemia, unspecified: Secondary | ICD-10-CM

## 2013-08-25 DIAGNOSIS — D509 Iron deficiency anemia, unspecified: Secondary | ICD-10-CM

## 2013-08-25 DIAGNOSIS — K31819 Angiodysplasia of stomach and duodenum without bleeding: Secondary | ICD-10-CM

## 2013-08-25 MED ORDER — FERUMOXYTOL INJECTION 510 MG/17 ML
1020.0000 mg | Freq: Once | INTRAVENOUS | Status: AC
  Administered 2013-08-25: 1020 mg via INTRAVENOUS
  Filled 2013-08-25: qty 34

## 2013-08-25 MED ORDER — SODIUM CHLORIDE 0.9 % IV SOLN
Freq: Once | INTRAVENOUS | Status: AC
  Administered 2013-08-25: 09:00:00 via INTRAVENOUS

## 2013-08-25 NOTE — Patient Instructions (Signed)

## 2013-08-26 LAB — SEDIMENTATION RATE: Sed Rate: 35 mm/hr — ABNORMAL HIGH (ref 0–22)

## 2013-08-26 LAB — ERYTHROPOIETIN: Erythropoietin: 179.1 m[IU]/mL — ABNORMAL HIGH (ref 2.6–18.5)

## 2013-09-03 DIAGNOSIS — M1710 Unilateral primary osteoarthritis, unspecified knee: Secondary | ICD-10-CM | POA: Diagnosis not present

## 2013-09-03 DIAGNOSIS — IMO0002 Reserved for concepts with insufficient information to code with codable children: Secondary | ICD-10-CM | POA: Diagnosis not present

## 2013-09-03 DIAGNOSIS — M5137 Other intervertebral disc degeneration, lumbosacral region: Secondary | ICD-10-CM | POA: Diagnosis not present

## 2013-09-03 DIAGNOSIS — G894 Chronic pain syndrome: Secondary | ICD-10-CM | POA: Diagnosis not present

## 2013-09-03 DIAGNOSIS — M47817 Spondylosis without myelopathy or radiculopathy, lumbosacral region: Secondary | ICD-10-CM | POA: Diagnosis not present

## 2013-09-07 ENCOUNTER — Other Ambulatory Visit (HOSPITAL_BASED_OUTPATIENT_CLINIC_OR_DEPARTMENT_OTHER): Payer: Medicare Other

## 2013-09-07 ENCOUNTER — Ambulatory Visit (HOSPITAL_BASED_OUTPATIENT_CLINIC_OR_DEPARTMENT_OTHER): Payer: Medicare Other | Admitting: Hematology and Oncology

## 2013-09-07 ENCOUNTER — Telehealth: Payer: Self-pay | Admitting: *Deleted

## 2013-09-07 ENCOUNTER — Telehealth: Payer: Self-pay | Admitting: Hematology and Oncology

## 2013-09-07 VITALS — BP 130/55 | HR 77 | Temp 98.4°F | Resp 18 | Ht 60.0 in | Wt 183.2 lb

## 2013-09-07 DIAGNOSIS — D72819 Decreased white blood cell count, unspecified: Secondary | ICD-10-CM | POA: Diagnosis not present

## 2013-09-07 DIAGNOSIS — D539 Nutritional anemia, unspecified: Secondary | ICD-10-CM

## 2013-09-07 DIAGNOSIS — D638 Anemia in other chronic diseases classified elsewhere: Secondary | ICD-10-CM

## 2013-09-07 DIAGNOSIS — D508 Other iron deficiency anemias: Secondary | ICD-10-CM

## 2013-09-07 LAB — CBC & DIFF AND RETIC
BASO%: 0.6 % (ref 0.0–2.0)
Basophils Absolute: 0 10*3/uL (ref 0.0–0.1)
EOS%: 3.8 % (ref 0.0–7.0)
Eosinophils Absolute: 0.1 10*3/uL (ref 0.0–0.5)
HCT: 28 % — ABNORMAL LOW (ref 34.8–46.6)
HGB: 8.7 g/dL — ABNORMAL LOW (ref 11.6–15.9)
IMMATURE RETIC FRACT: 9.6 % (ref 1.60–10.00)
LYMPH#: 0.7 10*3/uL — AB (ref 0.9–3.3)
LYMPH%: 20.8 % (ref 14.0–49.7)
MCH: 30.4 pg (ref 25.1–34.0)
MCHC: 31.1 g/dL — AB (ref 31.5–36.0)
MCV: 97.9 fL (ref 79.5–101.0)
MONO#: 0.5 10*3/uL (ref 0.1–0.9)
MONO%: 15.1 % — ABNORMAL HIGH (ref 0.0–14.0)
NEUT#: 1.9 10*3/uL (ref 1.5–6.5)
NEUT%: 59.7 % (ref 38.4–76.8)
Platelets: 214 10*3/uL (ref 145–400)
RBC: 2.86 10*6/uL — ABNORMAL LOW (ref 3.70–5.45)
RDW: 16.6 % — AB (ref 11.2–14.5)
RETIC CT ABS: 85.51 10*3/uL (ref 33.70–90.70)
Retic %: 2.99 % — ABNORMAL HIGH (ref 0.70–2.10)
WBC: 3.2 10*3/uL — AB (ref 3.9–10.3)

## 2013-09-07 LAB — HOLD TUBE, BLOOD BANK

## 2013-09-07 NOTE — Assessment & Plan Note (Signed)
I suspect this is due to chronic blood loss although with her age, myelodysplastic syndrome cannot be excluded. Overall, her symptoms has improved since recent iron infusion but her hemoglobin just barely improved by 1 g with low reticulocyte ptosis. I suspect there may be a component of anemia chronic disease and possible myelodysplastic syndrome. I explained to her and her son the importance of bone marrow aspirate and biopsy, especially in view of additional leukopenia which is unexplained. She agreed to proceed with bone marrow biopsy at the end of this month.

## 2013-09-07 NOTE — Assessment & Plan Note (Signed)
The cause of leukopenia is unknown. I recommend proceed with bone marrow aspirate and biopsy and she agreed.

## 2013-09-07 NOTE — Telephone Encounter (Signed)
Called pt to inform of BMBx and she states her son is not home but she will give him message to call nurse back for details.  Offered to call him on his cell phone but pt said he was at the grocery store and not to bother him.

## 2013-09-07 NOTE — Telephone Encounter (Signed)
Pt confirmed labs/ov per 08/03 POF, gave pt AVS...KJ °

## 2013-09-07 NOTE — Progress Notes (Signed)
[facility] OFFICE PROGRESS NOTE  Sophia Pel, MD SUMMARY OF HEMATOLOGIC HISTORY: She was found to have abnormal CBC from recent blood count monitoring. The patient have chronic iron deficiency anemia due to recurrent GI bleed. 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 She had received intravenous iron infusions in the past but that was discontinued when her iron studies came back high. She was placed on oral iron supplement for a while. On 08/25/2013, she received one dose of intravenous iron. INTERVAL HISTORY: Sophia [name] 80 y.o. female returns for further followup. She continues to feel fatigue. She denies recent bleeding. No recent infection.   I have reviewed the past medical history, past surgical history, [name]; risedronate sodium; sertraline; fosamax ; lipitor; pregabalin; tolterodine tartrate; and wasp venom.  MEDICATIONS:  Current Outpatient Prescriptions  Medication Sig Dispense Refill  . ALPRAZolam (XANAX) 0.5 MG tablet 1/2- 1 tab by mouth three times a day as needed      . Artificial Tear Solution SOLN Place 1 each into the right eye daily.      [name] buPROPion (WELLBUTRIN SR) 150 MG 12 hr tablet Take 150 mg by mouth 2 (two) times daily.      . calcium citrate-vitamin D (CITRACAL+D) 315-200 MG-UNIT per tablet 1 tablet. Take 3 tablets by mouth once daily      . Cholecalciferol (VITAMIN D3) 1000 UNITS CAPS Take by mouth. Take 1 tablet by mouth once daily       . denosumab (PROLIA) 60 MG/ML SOLN Inject 60 mg into the skin once. One shot every 6 months.       . dexlansoprazole (DEXILANT) 60 MG capsule Take 1 capsule (60 mg total) by mouth daily.  90 capsule  1  . EPINEPHrine (EPIPEN JR) 0.15 MG/0.3ML injection Inject 0.3 mg into the muscle as needed for anaphylaxis.       . fluorometholone (FML) 0.1 % ophthalmic suspension       . FLUOROMETHOLONE ACETATE OP Place 1 drop into the left eye 3 (three) times daily.      . folic acid (FOLVITE) 300 MCG tablet Take 800 mcg by mouth daily.      . furosemide (LASIX) 40 MG tablet Take 40 mg by mouth every other day.       . gabapentin (NEURONTIN) 400 MG tablet Take 400 mg by mouth 2 (two) times daily.       [name] HYDROcodone-acetaminophen (NORCO/VICODIN) 5-325 MG per tablet Take 1 tablet by mouth every 6 (six) hours as needed for pain.      . iron dextran complex (INFED) 50 MG/ML injection Iron infusion- Infed infustion over 4 hours of pharmacy calculated dose. No test dose needed. Weight-187 lbs Height - 5'.  Pt took 09-2011      . traZODone (DESYREL) 50 MG tablet at bedtime as needed for sleep.       . verapamil (VERELAN PM) 240 MG 24 hr capsule Take 1 tablet by mouth daily       No current facility-administered medications for this visit.     REVIEW OF SYSTEMS:   Constitutional: Denies fevers, [name], nose, mouth, throat, [name], [name], [name], [name], [name], no new changes  All other systems were reviewed with the patient and are negative.  PHYSICAL EXAMINATION: ECOG PERFORMANCE STATUS: 0 - Asymptomatic  Filed Vitals:   09/07/13 0905  BP: 130/55  Pulse: 77  Temp: 98.4 F (36.9 C)  Resp: 18   Filed Weights   09/07/13 0905  Weight: 183 lb 3.2 oz (83.099 kg)    GENERAL:alert, [name], texture, turgor are normal, no rashes or significant  lesions Musculoskeletal:no cyanosis of digits and no clubbing  NEURO: alert & oriented x 3 with fluent speech, no focal motor/sensory deficits  LABORATORY DATA:  I have reviewed the data as listed Results for orders placed in visit on 09/07/13 (from the past 48 hour(s))  CBC & DIFF AND RETIC     Status: Abnormal   Collection Time    09/07/13  8:45 AM      Result Value Ref Range   WBC 3.2 (*) 3.9 - 10.3 10e3/uL   NEUT# 1.9  1.5 - 6.5 10e3/uL   HGB 8.7 (*) 11.6 - 15.9 g/dL   HCT 28.0 (*) 34.8 - 46.6 %   Platelets 214  145 - 400 10e3/uL   MCV 97.9  79.5 - 101.0 fL   MCH 30.4  25.1 - 34.0 pg   MCHC 31.1 (*) 31.5 - 36.0 g/dL   RBC 2.86 (*) 3.70 - 5.45 10e6/uL   RDW 16.6 (*) 11.2 - 14.5 %   lymph# 0.7 (*) 0.9 - 3.3 10e3/uL   MONO# 0.5  0.1 - 0.9 10e3/uL   Eosinophils Absolute 0.1  0.0 - 0.5 10e3/uL   Basophils Absolute 0.0  0.0 - 0.1 10e3/uL   NEUT% 59.7  38.4 - 76.8 %   LYMPH% 20.8  14.0 - 49.7 %   MONO% 15.1 (*) 0.0 - 14.0 %   EOS% 3.8  0.0 - 7.0 %   BASO% 0.6  0.0 - 2.0 %   Retic % 2.99 (*) 0.70 - 2.10 %   Retic Ct Abs 85.51  33.70 - 90.70 10e3/uL   Immature Retic Fract 9.60  1.60 - 10.00 %  HOLD TUBE, BLOOD BANK     Status: None   Collection Time    09/07/13  8:45 AM      Result Value Ref Range   Hold Tube, Blood Bank Blood Bank Order Cancelled      Lab Results  Component Value Date   WBC 3.2* 09/07/2013   HGB 8.7* 09/07/2013   HCT 28.0* 09/07/2013   MCV 97.9 09/07/2013   PLT 214 09/07/2013   ASSESSMENT & PLAN:  Unspecified deficiency anemia I suspect this is due to chronic blood loss although with her age, myelodysplastic syndrome cannot be excluded. Overall, her symptoms has improved since recent iron infusion but her hemoglobin just barely improved by 1 g with low reticulocyte ptosis. I suspect there may be a component of anemia chronic disease and possible myelodysplastic syndrome. I explained to her and her son the importance of bone marrow aspirate and biopsy, especially  in view of additional leukopenia which is unexplained. She agreed to proceed with bone marrow biopsy at the end of this month.  Leukopenia The cause of leukopenia is unknown. I recommend proceed with bone marrow aspirate and biopsy and she agreed.   All questions were  answered. The patient knows to call the clinic with any problems, questions or concerns. No barriers to learning was detected.  I spent 25 minutes counseling the patient face to face. The total time spent in the appointment was 30 minutes and more than 50% was on counseling.     [facility], [place], MD 09/07/2013 12:14 PM

## 2013-09-18 ENCOUNTER — Telehealth: Payer: Self-pay | Admitting: *Deleted

## 2013-09-18 NOTE — Telephone Encounter (Signed)
Instructed son on BMBx on 8/26,  Arrive at [employer] [facility] at 7 am,  NPO after midnight and need a driver home.  Son verbalized understanding.

## 2013-09-18 NOTE — Telephone Encounter (Signed)
Left VM for son to return nurse's call.  Need to give him date/ time and instructions for pt's BMBx on 8/26.

## 2013-09-30 ENCOUNTER — Encounter (HOSPITAL_COMMUNITY): Payer: Self-pay

## 2013-09-30 ENCOUNTER — Other Ambulatory Visit (HOSPITAL_COMMUNITY): Payer: Self-pay | Admitting: Hematology and Oncology

## 2013-09-30 ENCOUNTER — Ambulatory Visit (HOSPITAL_COMMUNITY)
Admission: RE | Admit: 2013-09-30 | Discharge: 2013-09-30 | Disposition: A | Discharge status: Home / Self Care | Payer: Medicare Other | Source: Ambulatory Visit | Attending: Hematology and Oncology | Admitting: Hematology and Oncology

## 2013-09-30 VITALS — BP 150/60 | HR 80 | Temp 98.2°F | Resp 16 | Ht 59.5 in | Wt 181.0 lb

## 2013-09-30 DIAGNOSIS — D649 Anemia, unspecified: Secondary | ICD-10-CM | POA: Insufficient documentation

## 2013-09-30 DIAGNOSIS — D500 Iron deficiency anemia secondary to blood loss (chronic): Secondary | ICD-10-CM

## 2013-09-30 DIAGNOSIS — D72819 Decreased white blood cell count, unspecified: Secondary | ICD-10-CM | POA: Insufficient documentation

## 2013-09-30 DIAGNOSIS — D638 Anemia in other chronic diseases classified elsewhere: Secondary | ICD-10-CM

## 2013-09-30 LAB — CBC WITH DIFFERENTIAL/PLATELET
BASOS PCT: 1 % (ref 0–1)
Basophils Absolute: 0 10*3/uL (ref 0.0–0.1)
EOS ABS: 0.1 10*3/uL (ref 0.0–0.7)
Eosinophils Relative: 3 % (ref 0–5)
HEMATOCRIT: 31.5 % — AB (ref 36.0–46.0)
HEMOGLOBIN: 10.1 g/dL — AB (ref 12.0–15.0)
Lymphocytes Relative: 24 % (ref 12–46)
Lymphs Abs: 0.7 10*3/uL (ref 0.7–4.0)
MCH: 30.7 pg (ref 26.0–34.0)
MCHC: 32.1 g/dL (ref 30.0–36.0)
MCV: 95.7 fL (ref 78.0–100.0)
MONOS PCT: 13 % — AB (ref 3–12)
Monocytes Absolute: 0.4 10*3/uL (ref 0.1–1.0)
Neutro Abs: 1.6 10*3/uL — ABNORMAL LOW (ref 1.7–7.7)
Neutrophils Relative %: 59 % (ref 43–77)
Platelets: 249 10*3/uL (ref 150–400)
RBC: 3.29 MIL/uL — ABNORMAL LOW (ref 3.87–5.11)
RDW: 14.4 % (ref 11.5–15.5)
WBC: 2.7 10*3/uL — ABNORMAL LOW (ref 4.0–10.5)

## 2013-09-30 LAB — BONE MARROW EXAM

## 2013-09-30 LAB — FERRITIN: Ferritin: 74 ng/mL (ref 10–291)

## 2013-09-30 MED ORDER — MIDAZOLAM HCL 10 MG/2ML IJ SOLN
10.0000 mg | Freq: Once | INTRAMUSCULAR | Status: DC
  Filled 2013-09-30: qty 2

## 2013-09-30 MED ORDER — MIDAZOLAM HCL 5 MG/5ML IJ SOLN
INTRAMUSCULAR | Status: AC | PRN
  Administered 2013-09-30 (×4): 1 mg via INTRAVENOUS

## 2013-09-30 MED ORDER — FENTANYL CITRATE 0.05 MG/ML IJ SOLN
100.0000 ug | Freq: Once | INTRAMUSCULAR | Status: DC
  Filled 2013-09-30: qty 2

## 2013-09-30 MED ORDER — FENTANYL CITRATE 0.05 MG/ML IJ SOLN
INTRAMUSCULAR | Status: AC | PRN
  Administered 2013-09-30: 25 ug via INTRAVENOUS

## 2013-09-30 MED ORDER — SODIUM CHLORIDE 0.9 % IV SOLN
Freq: Once | INTRAVENOUS | Status: AC
  Administered 2013-09-30: 08:00:00 via INTRAVENOUS

## 2013-09-30 NOTE — Sedation Documentation (Signed)
Procedure completed. Pt tolerated well. Lying on right side

## 2013-09-30 NOTE — Discharge Instructions (Signed)
Biopsy Care After Refer to this sheet in the next few weeks. These instructions provide you with information on caring for yourself after your procedure. Your caregiver may also give you more specific instructions. Your treatment has been planned according to current medical practices, but problems sometimes occur. Call your caregiver if you have any problems or questions after your procedure. If you had a fine needle biopsy, you may have soreness at the biopsy site for 1 to 2 days. If you had an open biopsy, you may have soreness at the biopsy site for 3 to 4 days. HOME CARE INSTRUCTIONS   You may resume normal diet and activities as directed.  Change bandages (dressings) as directed. If your wound was closed with a skin glue (adhesive), it will wear off and begin to peel in 7 days.  Only take over-the-counter or prescription medicines for pain, discomfort, or fever as directed by your caregiver.  Ask your caregiver when you can bathe and get your wound wet. SEEK IMMEDIATE MEDICAL CARE IF:   You have increased bleeding (more than a small spot) from the biopsy site.  You notice redness, swelling, or increasing pain at the biopsy site.  You have pus coming from the biopsy site.  You have a fever.  You notice a bad smell coming from the biopsy site or dressing.  You have a rash, have difficulty breathing, or have any allergic problems. MAKE SURE YOU:   Understand these instructions.  Will watch your condition.  Will get help right away if you are not doing well or get worse. Document Released: 08/11/2004 Document Revised: 04/16/2011 Document Reviewed: 07/20/2010 [facility] Patient Information 2015 Sophia [name], [place]. This information is not intended to replace advice given to you by your health care provider. Make sure you discuss any questions you have with your health care provider. Conscious Sedation, Adult, Care After Refer to this sheet in the next few weeks. These instructions  provide you with information on caring for yourself after your procedure. Your health care provider may also give you more specific instructions. Your treatment has been planned according to current medical practices, but problems sometimes occur. Call your health care provider if you have any problems or questions after your procedure. WHAT TO EXPECT AFTER THE PROCEDURE  After your procedure:  You may feel sleepy, clumsy, and have poor balance for several hours.  Vomiting may occur if you eat too soon after the procedure. HOME CARE INSTRUCTIONS  Do not participate in any activities where you could become injured for at least 24 hours. Do not:  Drive.  Swim.  Ride a bicycle.  Operate heavy machinery.  Cook.  Use power tools.  Climb ladders.  Work from a high place.  Do not make important decisions or sign legal documents until you are improved.  If you vomit, drink water, juice, or soup when you can drink without vomiting. Make sure you have little or no nausea before eating solid foods.  Only take over-the-counter or prescription medicines for pain, discomfort, or fever as directed by your health care provider.  Make sure you and your family fully understand everything about the medicines given to you, including what side effects may occur.  You should not drink alcohol, take sleeping pills, or take medicines that cause drowsiness for at least 24 hours.  If you smoke, do not smoke without supervision.  If you are feeling better, you may resume normal activities 24 hours after you were sedated.  Keep all appointments with  your health care provider. SEEK MEDICAL CARE IF:  Your skin is pale or bluish in color.  You continue to feel nauseous or vomit.  Your pain is getting worse and is not helped by medicine.  You have bleeding or swelling.  You are still sleepy or feeling clumsy after 24 hours. SEEK IMMEDIATE MEDICAL CARE IF:  You develop a rash.  You have  difficulty breathing.  You develop any type of allergic problem.  You have a fever. MAKE SURE YOU:  Understand these instructions.  Will watch your condition.  Will get help right away if you are not doing well or get worse. Document Released: 11/12/2012 Document Reviewed: 11/12/2012 [facility] Patient Information 2015 [place], [place]. This information is not intended to replace advice given to you by your health care provider. Make sure you discuss any questions you have with your health care provider.

## 2013-09-30 NOTE — Procedures (Signed)
Brief examination was performed. ENT: adequate airway clearance Heart: regular rate and rhythm. Left systolic murmur audible Lungs: clear to auscultation, no wheezes, normal respiratory effort  American Society of Anesthesiologists ASA scale 3  Mallampati Score of 2  Bone Marrow Biopsy and Aspiration Procedure Note   Informed consent was obtained and potential risks including bleeding, infection and pain were reviewed with the patient. I verified that the patient has been fasting since midnight.  The patient's name, date of birth, identification, consent and allergies were verified prior to the start of procedure and time out was performed.  A total of 4 mg of IV Versed and 25 mcg of IV fentanyl were given.  The left posterior iliac crest was chosen as the site of biopsy.  The skin was prepped with Betadine solution.   8 cc of 1% lidocaine was used to provide local anaesthesia.   10 cc of bone marrow aspirate was obtained followed by 1 inch biopsy.   The procedure was tolerated well and there were no complications.  The patient was stable at the end of the procedure.  Specimens sent for flow cytometry, cytogenetics and additional studies.

## 2013-09-30 NOTE — Sedation Documentation (Signed)
BX site remains C D I. Has drank sprite w/o problems

## 2013-09-30 NOTE — Sedation Documentation (Signed)
MD at bedside. 

## 2013-09-30 NOTE — Sedation Documentation (Addendum)
BX site left iliac C, D, I

## 2013-10-06 ENCOUNTER — Encounter (HOSPITAL_COMMUNITY): Payer: Self-pay

## 2013-10-06 ENCOUNTER — Encounter (HOSPITAL_COMMUNITY)
Admission: RE | Admit: 2013-10-06 | Discharge: 2013-10-06 | Disposition: A | Discharge status: Home / Self Care | Payer: Medicare Other | Source: Ambulatory Visit | Attending: Internal Medicine | Admitting: Internal Medicine

## 2013-10-06 ENCOUNTER — Other Ambulatory Visit (HOSPITAL_COMMUNITY): Payer: Self-pay | Admitting: Internal Medicine

## 2013-10-06 DIAGNOSIS — E871 Hypo-osmolality and hyponatremia: Secondary | ICD-10-CM | POA: Diagnosis not present

## 2013-10-06 MED ORDER — COSYNTROPIN 0.25 MG IJ SOLR
0.2500 mg | Freq: Once | INTRAMUSCULAR | Status: AC
  Administered 2013-10-06: 0.25 mg via INTRAVENOUS
  Filled 2013-10-06: qty 0.25

## 2013-10-06 NOTE — Discharge Instructions (Signed)
ACTH Stimulation Test   The ACTH stimulation test shows how well your adrenal glands are working. The adrenal glands are 2 glands that are above your kidneys. They make hormones (chemicals) that go into the blood. One of these hormones is cortisol. Cortisol helps the body respond to stress. If your adrenal glands are not working right, you may have too much or too little cortisol.  BEFORE THE TEST   You may need to stop taking some medications before the test. Give your caregiver a list of all medications you take. Include prescription and over-the-counter medicines. Also include herbs, vitamins, eyedrops, and medicated creams.   You may be asked to eat foods that contain carbohydrates the day before the test. Some examples are fruits, vegetables, breads, pastas, and milk.   Do not eat or drink for 6 hours before the test.   The test often is done in the early morning. Arrive in time to fill out any needed paperwork.  HOW THE TEST IS DONE  Your blood will be taken and tested 2 times. In between, you will be given a shot (injection) called cosyntropin. Cosyntropin makes the adrenal glands release cortisol. The whole test process usually takes a little more than an hour. Then, the blood samples will be checked in a lab. This test will show the difference in the amount of cortisol in your blood before and after you were given cosyntropin.   To take a sample of your blood:   An area on your arm or hand will be cleaned with a germ-killing (antiseptic) swab.   An elastic band may be wrapped around your upper arm. This makes it easier for your caregiver to see veins under your skin.   A needle will be put into your vein. Blood from the needle will flow into a small bottle or tube. Then, the needle is taken out.   The band is taken off. The caregiver will press on the spot where the needle went in to stop any bleeding.  Other times, something called a heparin lock is used. Only one needle is used for this test.  To do a heparin lock:   A small needle is put into your vein. Tape is put on your arm or hand to hold the needle in place.   For the first blood sample, the needle is hooked to a plastic tube. It is hooked to a different tube for the injection. And, a new tube is used for the last blood test.   When the test is done, the needle is taken out. Pressure and a bandage are used to stop any bleeding.  Sometimes, a urine sample also is taken. Cortisol levels can be checked in urine, too.  RISKS AND COMPLICATIONS  This is a simple and safe test. Problems are rare. But, they can occur. Possibilities include:   Bleeding at the spot where a needle was put in.   Bleeding or bruising under the skin (hematoma).   Fainting or feeling lightheaded.   Infection.  YOUR TEST RESULTS   It is your responsibility to obtain your test results. Ask the lab or department performing the test when and how you will get your results.   Cortisol levels in the blood normally go up after an ACTH stimulation test. The results are measured in micrograms per deciliter (mcg/dL). This tells how much (in micrograms) of cortisol there is in a certain amount (in deciliters) of blood.   Ranges for normal findings may vary among   different laboratories and hospitals. You should always check with your doctor after having lab work or other tests done to discuss the meaning of your test results and whether your values are considered within normal limits.   Your caregiver will help you understand the results. Keep asking questions until it is clear to you.   Results may not be normal for different reasons. You might need more testing.  Document Released: 02/24/2010 Document Revised: 06/08/2013 Document Reviewed: 02/24/2010  [employer] Patient Information 2015 [employer]. This information is not intended to replace advice given to you by your health care provider. Make sure you discuss any questions you have with your health care provider.

## 2013-10-06 NOTE — Progress Notes (Signed)
Pt arrives today for ACTH stimulation test(Cortrosyn test). She has been fasting since midnight. Baseline labs drawn at 0910. Then via saline well Cortrosyn given (see MAR) at 0916. Post injection drawn at : 30 minute labs drawn at 0946, 60 minute labs drawn at 1016 and 90 minute labs to be drawn at 1046 via saline lock.

## 2013-10-06 NOTE — Progress Notes (Signed)
Uneventful visit today. Pt had 90 minute post corrtosyn labs done at 1046. Saline lock d'c. VSS Pt discharged with her rolling walker accompanied by son to her car

## 2013-10-07 LAB — ACTH STIMULATION, 3 TIME POINTS
CORTISOL BASE: 16.6 ug/dL
Cortisol, 30 Min: 26 ug/dL (ref >20.0)
Cortisol, 60 Min: 30.2 ug/dL (ref >20)

## 2013-10-07 LAB — TISSUE HYBRIDIZATION (BONE MARROW)-NCBH

## 2013-10-07 LAB — CHROMOSOME ANALYSIS, BONE MARROW

## 2013-10-09 ENCOUNTER — Ambulatory Visit (HOSPITAL_BASED_OUTPATIENT_CLINIC_OR_DEPARTMENT_OTHER): Payer: Medicare Other | Admitting: Hematology and Oncology

## 2013-10-09 ENCOUNTER — Telehealth: Payer: Self-pay | Admitting: Hematology and Oncology

## 2013-10-09 ENCOUNTER — Encounter: Payer: Self-pay | Admitting: Hematology and Oncology

## 2013-10-09 VITALS — BP 118/45 | HR 78 | Temp 98.1°F | Resp 18 | Ht 59.5 in | Wt 187.2 lb

## 2013-10-09 DIAGNOSIS — D539 Nutritional anemia, unspecified: Secondary | ICD-10-CM

## 2013-10-09 DIAGNOSIS — D509 Iron deficiency anemia, unspecified: Secondary | ICD-10-CM | POA: Diagnosis not present

## 2013-10-09 DIAGNOSIS — Z23 Encounter for immunization: Secondary | ICD-10-CM | POA: Diagnosis not present

## 2013-10-09 DIAGNOSIS — D72819 Decreased white blood cell count, unspecified: Secondary | ICD-10-CM | POA: Diagnosis not present

## 2013-10-09 DIAGNOSIS — D508 Other iron deficiency anemias: Secondary | ICD-10-CM

## 2013-10-09 MED ORDER — INFLUENZA VAC SPLIT QUAD 0.5 ML IM SUSY
0.5000 mL | PREFILLED_SYRINGE | Freq: Once | INTRAMUSCULAR | Status: AC
  Administered 2013-10-09: 0.5 mL via INTRAMUSCULAR
  Filled 2013-10-09: qty 0.5

## 2013-10-09 NOTE — Telephone Encounter (Signed)
gv adn printed appt sched and avs for pt for OCT....sed added tx. °

## 2013-10-09 NOTE — Assessment & Plan Note (Signed)
The cause of leukopenia is unknown. Bone marrow aspirate and biopsy showed nothing suspicious for mild dysplastic syndrome.

## 2013-10-10 NOTE — Assessment & Plan Note (Signed)
I suspect that is the component of iron deficiency and anemia of chronic disease. After iron infusion, she had marked improvement of anemia with high ferritin level. I recommend she resumed iron supplement daily. I plan to see her back in 1 month with repeat blood work ahead of time and to give her further iron infusion if needed.

## 2013-10-10 NOTE — Progress Notes (Signed)
[name] OFF[facility] PROGRESS NOTE  Horatio Pel, MD SUMMARY OF HEMATOLOGIC HISTORY: She was found to have abnormal CBC from recent blood count monitoring. The patient have chronic iron deficiency anemia due to recurrent GI bleed. 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 She had received intravenous iron infusions in the past but that was discontinued when her iron studies came back high. She was placed on oral iron supplement for a while. On 08/25/2013, she received one dose of intravenous iron. On 09/30/2013, [name], [name], past surgical history, [name]; risedronate sodium; sertraline; fosamax ; lipitor; pregabalin; tolterodine tartrate; and wasp venom.  MEDICATIONS:  Current Outpatient Prescriptions  Medication Sig Dispense Refill  . ALPRAZolam (XANAX) 0.5 MG tablet 1/2- 1 tab by mouth three times a day as needed      . Artificial Tear Solution SOLN Place 1 each into the right eye daily.      [name] buPROPion (WELLBUTRIN SR) 150 MG 12 hr tablet Take 150 mg by mouth 2 (two) times daily.      . calcium citrate-vitamin D (CITRACAL+D) 315-200 MG-UNIT per tablet 1 tablet. Take 3 tablets by mouth once daily      . Cholecalciferol (VITAMIN D3) 1000 UNITS CAPS Take by mouth. Take 1 tablet by mouth once daily       . denosumab (PROLIA) 60 MG/ML SOLN Inject 60 mg into the skin once. One shot every 6 months.       .  dexlansoprazole (DEXILANT) 60 MG capsule Take 1 capsule (60 mg total) by mouth daily.  90 capsule  1  . EPINEPHrine (EPIPEN JR) 0.15 MG/0.3ML injection Inject 0.3 mg into the muscle as needed for anaphylaxis.      . fluorometholone (FML) 0.1 % ophthalmic suspension       . FLUOROMETHOLONE ACETATE OP Place 1 drop into the left eye 3 (three) times daily.      . folic acid (FOLVITE) 350 MCG tablet Take 800 mcg by mouth daily.      . furosemide (LASIX) 40 MG tablet Take 40 mg by mouth every other day.       . gabapentin (NEURONTIN) 400 MG tablet Take 400 mg by mouth 2 (two) times daily.       [name] HYDROcodone-acetaminophen (NORCO/VICODIN) 5-325 MG per tablet Take 1 tablet by mouth every 6 (six) hours as needed for pain.      . iron dextran complex (INFED) 50 MG/ML injection Iron infusion- Infed infustion over 4 hours of pharmacy calculated dose. No test dose needed. Weight-187 lbs Height - 5'.  Pt took 09-2011      . verapamil (VERELAN PM) 240 MG 24 hr capsule Take 1 tablet by mouth daily      . zolpidem (AMBIEN) 5 MG tablet Take 5 mg by mouth at bedtime as needed for sleep.      . traZODone (DESYREL) 50 MG tablet at bedtime as needed for sleep.        No current facility-administered medications for this  visit.     REVIEW OF SYSTEMS:   Constitutional: Denies fevers, [name], nose, mouth, throat, [name], [name], [name], [name], [name], no new changes  All other systems were reviewed with the patient and are negative.  PHYSICAL EXAMINATION: ECOG PERFORMANCE STATUS: 0 -  Asymptomatic  Filed Vitals:   10/09/13 1225  BP: 118/45  Pulse: 78  Temp: 98.1 F (36.7 C)  Resp: 18   Filed Weights   10/09/13 1225  Weight: 187 lb 3.2 oz (84.913 kg)    GENERAL:alert, [name], texture, turgor are normal, [name], [name], [name], no focal motor/sensory deficits  LABORATORY DATA:  I have reviewed the data as listed No results found for this or any previous visit (from the past 48 hour(s)).  Lab Results  Component Value Date   WBC 2.7* 09/30/2013   HGB 10.1* 09/30/2013   HCT 31.5* 09/30/2013   MCV 95.7 09/30/2013   PLT 249 09/30/2013    ASSESSMENT & PLAN:  Leukopenia The cause of leukopenia is unknown. Bone marrow aspirate and biopsy showed nothing suspicious for mild dysplastic syndrome.    ANEMIA, [name], [name], questions or concerns. No barriers to learning was detected.  I spent 15 minutes counseling the patient face to face. The total time spent in the appointment was 20 minutes and more than 50% was on counseling.     [name], [name], MD 10/10/2013 3:36  PM

## 2013-11-06 ENCOUNTER — Other Ambulatory Visit (HOSPITAL_BASED_OUTPATIENT_CLINIC_OR_DEPARTMENT_OTHER): Payer: Medicare Other

## 2013-11-06 ENCOUNTER — Other Ambulatory Visit: Payer: Self-pay | Admitting: Hematology and Oncology

## 2013-11-06 ENCOUNTER — Telehealth: Payer: Self-pay | Admitting: *Deleted

## 2013-11-06 DIAGNOSIS — D509 Iron deficiency anemia, unspecified: Secondary | ICD-10-CM | POA: Diagnosis not present

## 2013-11-06 DIAGNOSIS — D539 Nutritional anemia, unspecified: Secondary | ICD-10-CM

## 2013-11-06 DIAGNOSIS — D508 Other iron deficiency anemias: Secondary | ICD-10-CM

## 2013-11-06 LAB — CBC & DIFF AND RETIC
BASO%: 0.3 % (ref 0.0–2.0)
Basophils Absolute: 0 10*3/uL (ref 0.0–0.1)
EOS%: 2.4 % (ref 0.0–7.0)
Eosinophils Absolute: 0.1 10*3/uL (ref 0.0–0.5)
HEMATOCRIT: 30.5 % — AB (ref 34.8–46.6)
HGB: 9.5 g/dL — ABNORMAL LOW (ref 11.6–15.9)
Immature Retic Fract: 10.6 % — ABNORMAL HIGH (ref 1.60–10.00)
LYMPH%: 19 % (ref 14.0–49.7)
MCH: 30 pg (ref 25.1–34.0)
MCHC: 31.1 g/dL — ABNORMAL LOW (ref 31.5–36.0)
MCV: 96.2 fL (ref 79.5–101.0)
MONO#: 0.5 10*3/uL (ref 0.1–0.9)
MONO%: 14.4 % — ABNORMAL HIGH (ref 0.0–14.0)
NEUT#: 2.4 10*3/uL (ref 1.5–6.5)
NEUT%: 63.9 % (ref 38.4–76.8)
PLATELETS: 225 10*3/uL (ref 145–400)
RBC: 3.17 10*6/uL — AB (ref 3.70–5.45)
RDW: 14.4 % (ref 11.2–14.5)
RETIC %: 1.86 % (ref 0.70–2.10)
Retic Ct Abs: 58.96 10*3/uL (ref 33.70–90.70)
WBC: 3.7 10*3/uL — ABNORMAL LOW (ref 3.9–10.3)
lymph#: 0.7 10*3/uL — ABNORMAL LOW (ref 0.9–3.3)

## 2013-11-06 LAB — FERRITIN CHCC: Ferritin: 25 ng/ml (ref 9–269)

## 2013-11-06 LAB — HOLD TUBE, BLOOD BANK

## 2013-11-06 NOTE — Telephone Encounter (Signed)
Spoke w/ pt and son in lobby earlier today.  Gave them results of CBC and informed no need for transfusion.  They requested to be called w/ results of Ferritin level today.   Called son w/ results and keep appt w/ Dr. Alvy Bimler next week as scheduled.  He verbalized understanding.

## 2013-11-10 ENCOUNTER — Telehealth: Payer: Self-pay | Admitting: *Deleted

## 2013-11-10 DIAGNOSIS — D509 Iron deficiency anemia, unspecified: Secondary | ICD-10-CM

## 2013-11-10 NOTE — Telephone Encounter (Signed)
[place] Dragon, MD Hulan Saas, RN            She does not need more labs ibuprofen would wait 3 months before another CBC      Previous Messages      ----- Message -----  From: Hulan Saas, RN  Sent: 11/09/2013 2:05 PM  To: Lafayette Dragon, MD   Dr. Olevia Perches,  It looks like the [facility] has been drawing labs on this patient. I had her down for labs also. Does she need more labs?  Rollene Fare

## 2013-11-12 ENCOUNTER — Ambulatory Visit (HOSPITAL_BASED_OUTPATIENT_CLINIC_OR_DEPARTMENT_OTHER): Payer: Medicare Other

## 2013-11-12 ENCOUNTER — Other Ambulatory Visit: Payer: Self-pay | Admitting: Hematology and Oncology

## 2013-11-12 ENCOUNTER — Telehealth: Payer: Self-pay | Admitting: Hematology and Oncology

## 2013-11-12 ENCOUNTER — Ambulatory Visit (HOSPITAL_BASED_OUTPATIENT_CLINIC_OR_DEPARTMENT_OTHER): Payer: Medicare Other | Admitting: Hematology and Oncology

## 2013-11-12 VITALS — BP 134/63 | HR 64 | Resp 16

## 2013-11-12 VITALS — BP 124/89 | HR 75 | Temp 97.8°F | Resp 18 | Ht 59.5 in | Wt 184.7 lb

## 2013-11-12 DIAGNOSIS — D72819 Decreased white blood cell count, unspecified: Secondary | ICD-10-CM

## 2013-11-12 DIAGNOSIS — K31819 Angiodysplasia of stomach and duodenum without bleeding: Secondary | ICD-10-CM

## 2013-11-12 DIAGNOSIS — K922 Gastrointestinal hemorrhage, unspecified: Secondary | ICD-10-CM

## 2013-11-12 DIAGNOSIS — D508 Other iron deficiency anemias: Secondary | ICD-10-CM | POA: Diagnosis not present

## 2013-11-12 DIAGNOSIS — D539 Nutritional anemia, unspecified: Secondary | ICD-10-CM

## 2013-11-12 MED ORDER — SODIUM CHLORIDE 0.9 % IJ SOLN
3.0000 mL | Freq: Once | INTRAMUSCULAR | Status: DC | PRN
  Filled 2013-11-12: qty 10

## 2013-11-12 MED ORDER — SODIUM CHLORIDE 0.9 % IV SOLN
1020.0000 mg | Freq: Once | INTRAVENOUS | Status: AC
  Administered 2013-11-12: 1020 mg via INTRAVENOUS
  Filled 2013-11-12: qty 34

## 2013-11-12 NOTE — Assessment & Plan Note (Addendum)
The cause of leukopenia is unknown. Bone marrow aspirate and biopsy showed nothing suspicious for myelodysplastic syndrome. She is not symptomatic. Recommend observation only.

## 2013-11-12 NOTE — Telephone Encounter (Signed)
gv and pritned appt sched and avs for ptf ro Dec, Feb adn April...sed added tx.

## 2013-11-12 NOTE — Progress Notes (Signed)
[facility] OFFICE PROGRESS NOTE  Sophia Pel, Sophia [name] SUMMARY OF HEMATOLOGIC HISTORY: She was found to have abnormal CBC from recent blood count monitoring. The patient have chronic iron deficiency anemia due to recurrent GI bleed. 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 She had received intravenous iron infusions in the past but that was discontinued when her iron studies came back high. She was placed on oral iron supplement for a while. On 08/25/2013, she received one dose of intravenous iron. On 09/30/2013, bone marrow biopsy was done which showed no evidence to suggest myelodysplastic syndrome. INTERVAL HISTORY: Sophia [name] 80 y.o. female returns for further followup. She complained of fatigue. She tolerated oral iron supplement poorly with bloating and constipation The patient denies any recent signs or symptoms of bleeding such as spontaneous epistaxis, hematuria or hematochezia.   I have reviewed the past medical history, past surgical history, [name]; risedronate sodium; sertraline; fosamax ; lipitor; pregabalin; tolterodine tartrate; and wasp venom.  MEDICATIONS:  Current Outpatient Prescriptions  Medication Sig Dispense Refill  . ALPRAZolam (XANAX) 0.5 MG tablet 1/2- 1 tab by mouth three times a day as needed      . Artificial Tear Solution SOLN Place 1 each into the right eye daily.      [name] buPROPion (WELLBUTRIN SR) 150 MG 12 hr tablet Take 150 mg by mouth 2 (two) times daily.      . calcium citrate-vitamin D (CITRACAL+D) 315-200 MG-UNIT per tablet 1 tablet. Take 3 tablets by mouth once daily      . Cholecalciferol (VITAMIN D3) 1000 UNITS CAPS Take by mouth. Take 1 tablet by mouth once daily       . denosumab (PROLIA) 60 MG/ML SOLN Inject 60 mg into the skin once. One  shot every 6 months.       . dexlansoprazole (DEXILANT) 60 MG capsule Take 1 capsule (60 mg total) by mouth daily.  90 capsule  1  . EPINEPHrine (EPIPEN JR) 0.15 MG/0.3ML injection Inject 0.3 mg into the muscle as needed for anaphylaxis.      . fluorometholone (FML) 0.1 % ophthalmic suspension       . FLUOROMETHOLONE ACETATE OP Place 1 drop into the left eye 3 (three) times daily.      . folic acid (FOLVITE) 201 MCG tablet Take 800 mcg by mouth daily.      . furosemide (LASIX) 40 MG tablet Take 40 mg by mouth every other day.       . gabapentin (NEURONTIN) 400 MG tablet Take 400 mg by mouth 2 (two) times daily.       [name] HYDROcodone-acetaminophen (NORCO/VICODIN) 5-325 MG per tablet Take 1 tablet by mouth every 6 (six) hours as needed for pain.      . iron dextran complex (INFED) 50 MG/ML injection Iron infusion- Infed infustion over 4 hours of pharmacy calculated dose. No test dose needed. Weight-187 lbs Height - 5'.  Pt took 09-2011      . verapamil (VERELAN PM) 240 MG 24 hr capsule Take 1 tablet by mouth daily      . zolpidem (AMBIEN) 5 MG tablet Take 5 mg by mouth at bedtime as needed for sleep.       No current facility-administered medications for this visit.     REVIEW OF SYSTEMS:   Constitutional: Denies  fevers, [name], nose, mouth, throat, [name], [name], [name], [name], [name], no new changes  All other systems were reviewed with the patient and are negative.  PHYSICAL EXAMINATION: ECOG PERFORMANCE STATUS: 1 - Symptomatic but completely  ambulatory  Filed Vitals:   11/12/13 1224  BP: 124/89  Pulse: 75  Temp: 97.8 F (36.6 C)  Resp: 18   Filed Weights   11/12/13 1224  Weight: 184 lb 11.2 oz (83.779 kg)    GENERAL:alert, [name] is pale, texture, turgor are normal, [name], Conjunctiva are pale and non-injected, [name], no focal motor/sensory deficits  LABORATORY DATA:  I have reviewed the data as listed No results found for this or any previous visit (from the past 48 hour(s)).  Lab Results  Component Value Date   WBC 3.7* 11/06/2013   HGB 9.5* 11/06/2013   HCT 30.5* 11/06/2013   MCV 96.2 11/06/2013   PLT 225 11/06/2013    ASSESSMENT & PLAN:  Other iron deficiency anemias I suspect that is the component of iron deficiency and anemia of chronic disease. There is no doubt she has persistent and recurrent GI bleed After iron infusion, she had marked improvement of anemia with high ferritin level and since then it has dropped again. I recommend she resumed iron supplement daily but she tolerated that very poorly. We discussed some of the risks, benefits, and alternatives of intravenous iron infusions. The patient is symptomatic from anemia and the iron level is critically low. She tolerated oral iron supplement poorly and desires to achieved higher levels of iron faster for adequate hematopoesis. Some of the side-effects to be expected including risks of infusion reactions, phlebitis, headaches, nausea and fatigue.  The patient is willing to proceed. Patient education material was dispensed.  Goal is to keep ferritin level greater than 50   Leukopenia The cause of leukopenia is unknown. Bone marrow aspirate and biopsy showed nothing suspicious for myelodysplastic syndrome. She is not symptomatic. Recommend observation only.    All  questions were answered. The patient knows to call the clinic with any problems, questions or concerns. No barriers to learning was detected.  I spent 25 minutes counseling the patient face to face. The total time spent in the appointment was 30 minutes and more than 50% was on counseling.     [facility], [place], Sophia [name] 11/12/2013 9:51 PM

## 2013-11-12 NOTE — Assessment & Plan Note (Signed)
I suspect that is the component of iron deficiency and anemia of chronic disease. There is no doubt she has persistent and recurrent GI bleed After iron infusion, she had marked improvement of anemia with high ferritin level and since then it has dropped again. I recommend she resumed iron supplement daily but she tolerated that very poorly. We discussed some of the risks, benefits, and alternatives of intravenous iron infusions. The patient is symptomatic from anemia and the iron level is critically low. She tolerated oral iron supplement poorly and desires to achieved higher levels of iron faster for adequate hematopoesis. Some of the side-effects to be expected including risks of infusion reactions, phlebitis, headaches, nausea and fatigue.  The patient is willing to proceed. Patient education material was dispensed.  Goal is to keep ferritin level greater than 50

## 2013-11-12 NOTE — Patient Instructions (Signed)

## 2013-11-19 DIAGNOSIS — R5383 Other fatigue: Secondary | ICD-10-CM | POA: Diagnosis not present

## 2013-11-19 DIAGNOSIS — M545 Low back pain: Secondary | ICD-10-CM | POA: Diagnosis not present

## 2013-11-19 DIAGNOSIS — D649 Anemia, unspecified: Secondary | ICD-10-CM | POA: Diagnosis not present

## 2013-11-20 ENCOUNTER — Other Ambulatory Visit: Payer: Self-pay

## 2013-12-11 DIAGNOSIS — H1810 Bullous keratopathy, unspecified eye: Secondary | ICD-10-CM | POA: Diagnosis not present

## 2013-12-11 DIAGNOSIS — H3531 Nonexudative age-related macular degeneration: Secondary | ICD-10-CM | POA: Diagnosis not present

## 2013-12-11 DIAGNOSIS — Z961 Presence of intraocular lens: Secondary | ICD-10-CM | POA: Diagnosis not present

## 2013-12-11 DIAGNOSIS — H40013 Open angle with borderline findings, low risk, bilateral: Secondary | ICD-10-CM | POA: Diagnosis not present

## 2013-12-16 ENCOUNTER — Other Ambulatory Visit: Payer: Self-pay | Admitting: Hematology and Oncology

## 2014-01-12 ENCOUNTER — Telehealth: Payer: Self-pay | Admitting: *Deleted

## 2014-01-12 ENCOUNTER — Other Ambulatory Visit (HOSPITAL_BASED_OUTPATIENT_CLINIC_OR_DEPARTMENT_OTHER): Payer: Medicare Other

## 2014-01-12 DIAGNOSIS — D508 Other iron deficiency anemias: Secondary | ICD-10-CM

## 2014-01-12 DIAGNOSIS — K31819 Angiodysplasia of stomach and duodenum without bleeding: Secondary | ICD-10-CM

## 2014-01-12 DIAGNOSIS — D539 Nutritional anemia, unspecified: Secondary | ICD-10-CM

## 2014-01-12 LAB — CBC & DIFF AND RETIC
BASO%: 0 % (ref 0.0–2.0)
BASOS ABS: 0 10*3/uL (ref 0.0–0.1)
EOS%: 1.5 % (ref 0.0–7.0)
Eosinophils Absolute: 0.1 10*3/uL (ref 0.0–0.5)
HCT: 32.5 % — ABNORMAL LOW (ref 34.8–46.6)
HEMOGLOBIN: 10.6 g/dL — AB (ref 11.6–15.9)
Immature Retic Fract: 14.2 % — ABNORMAL HIGH (ref 1.60–10.00)
LYMPH%: 20.9 % (ref 14.0–49.7)
MCH: 32.5 pg (ref 25.1–34.0)
MCHC: 32.6 g/dL (ref 31.5–36.0)
MCV: 99.7 fL (ref 79.5–101.0)
MONO#: 0.3 10*3/uL (ref 0.1–0.9)
MONO%: 8.5 % (ref 0.0–14.0)
NEUT%: 69.1 % (ref 38.4–76.8)
NEUTROS ABS: 2.8 10*3/uL (ref 1.5–6.5)
PLATELETS: 223 10*3/uL (ref 145–400)
RBC: 3.26 10*6/uL — ABNORMAL LOW (ref 3.70–5.45)
RDW: 14 % (ref 11.2–14.5)
Retic %: 1.82 % (ref 0.70–2.10)
Retic Ct Abs: 59.33 10*3/uL (ref 33.70–90.70)
WBC: 4 10*3/uL (ref 3.9–10.3)
lymph#: 0.8 10*3/uL — ABNORMAL LOW (ref 0.9–3.3)

## 2014-01-12 LAB — HOLD TUBE, BLOOD BANK

## 2014-01-12 LAB — FERRITIN CHCC: Ferritin: 63 ng/ml (ref 9–269)

## 2014-01-12 NOTE — Telephone Encounter (Signed)
Spoke with pt and informed pt re: Per Dr. Alvy Bimler, labs are stable and better than the last time -  Ferritin  63  Today.

## 2014-02-03 ENCOUNTER — Telehealth: Payer: Self-pay | Admitting: *Deleted

## 2014-02-03 ENCOUNTER — Other Ambulatory Visit: Payer: Self-pay | Admitting: *Deleted

## 2014-02-03 DIAGNOSIS — D509 Iron deficiency anemia, unspecified: Secondary | ICD-10-CM

## 2014-02-03 NOTE — Telephone Encounter (Signed)
Please call pt with Hgb 10.6 which is better than usual. Repeat in 2 months.     ----- Message -----     From: Hulan Saas, RN     Sent: 02/03/2014  8:50 AM      To: Lafayette Dragon, MD       Patient notified. Lab in EPIC.

## 2014-02-17 DIAGNOSIS — M810 Age-related osteoporosis without current pathological fracture: Secondary | ICD-10-CM | POA: Diagnosis not present

## 2014-02-17 DIAGNOSIS — M150 Primary generalized (osteo)arthritis: Secondary | ICD-10-CM | POA: Diagnosis not present

## 2014-02-17 DIAGNOSIS — M25561 Pain in right knee: Secondary | ICD-10-CM | POA: Diagnosis not present

## 2014-02-17 DIAGNOSIS — M5136 Other intervertebral disc degeneration, lumbar region: Secondary | ICD-10-CM | POA: Diagnosis not present

## 2014-02-18 DIAGNOSIS — M545 Low back pain: Secondary | ICD-10-CM | POA: Diagnosis not present

## 2014-03-09 ENCOUNTER — Ambulatory Visit: Payer: Medicare Other

## 2014-03-09 ENCOUNTER — Other Ambulatory Visit (HOSPITAL_BASED_OUTPATIENT_CLINIC_OR_DEPARTMENT_OTHER): Payer: Medicare Other

## 2014-03-09 ENCOUNTER — Other Ambulatory Visit: Payer: Self-pay | Admitting: Hematology and Oncology

## 2014-03-09 ENCOUNTER — Other Ambulatory Visit: Payer: Self-pay | Admitting: *Deleted

## 2014-03-09 ENCOUNTER — Ambulatory Visit (HOSPITAL_COMMUNITY)
Admission: RE | Admit: 2014-03-09 | Discharge: 2014-03-09 | Disposition: A | Discharge status: Home / Self Care | Payer: Medicare Other | Source: Ambulatory Visit | Attending: Hematology and Oncology | Admitting: Hematology and Oncology

## 2014-03-09 VITALS — BP 136/54 | HR 92 | Temp 98.5°F | Resp 18

## 2014-03-09 DIAGNOSIS — D500 Iron deficiency anemia secondary to blood loss (chronic): Secondary | ICD-10-CM

## 2014-03-09 DIAGNOSIS — D539 Nutritional anemia, unspecified: Secondary | ICD-10-CM

## 2014-03-09 DIAGNOSIS — D638 Anemia in other chronic diseases classified elsewhere: Secondary | ICD-10-CM | POA: Diagnosis not present

## 2014-03-09 DIAGNOSIS — K31819 Angiodysplasia of stomach and duodenum without bleeding: Secondary | ICD-10-CM

## 2014-03-09 DIAGNOSIS — D508 Other iron deficiency anemias: Secondary | ICD-10-CM | POA: Diagnosis not present

## 2014-03-09 DIAGNOSIS — D509 Iron deficiency anemia, unspecified: Secondary | ICD-10-CM

## 2014-03-09 LAB — CBC & DIFF AND RETIC
BASO%: 1 % (ref 0.0–2.0)
Basophils Absolute: 0 10*3/uL (ref 0.0–0.1)
EOS ABS: 0 10*3/uL (ref 0.0–0.5)
EOS%: 1.1 % (ref 0.0–7.0)
HEMATOCRIT: 19 % — AB (ref 34.8–46.6)
HEMOGLOBIN: 5.7 g/dL — AB (ref 11.6–15.9)
Immature Retic Fract: 15.5 % — ABNORMAL HIGH (ref 1.60–10.00)
LYMPH%: 14.5 % (ref 14.0–49.7)
MCH: 26.5 pg (ref 25.1–34.0)
MCHC: 30.2 g/dL — AB (ref 31.5–36.0)
MCV: 87.8 fL (ref 79.5–101.0)
MONO#: 0.4 10*3/uL (ref 0.1–0.9)
MONO%: 12 % (ref 0.0–14.0)
NEUT#: 2.3 10*3/uL (ref 1.5–6.5)
NEUT%: 71.4 % (ref 38.4–76.8)
Platelets: 243 10*3/uL (ref 145–400)
RBC: 2.17 10*6/uL — AB (ref 3.70–5.45)
RDW: 16 % — AB (ref 11.2–14.5)
RETIC %: 2.55 % — AB (ref 0.70–2.10)
Retic Ct Abs: 55.3 10*3/uL (ref 33.70–90.70)
WBC: 3.2 10*3/uL — AB (ref 3.9–10.3)
lymph#: 0.5 10*3/uL — ABNORMAL LOW (ref 0.9–3.3)

## 2014-03-09 LAB — FERRITIN CHCC: Ferritin: 14 ng/ml (ref 9–269)

## 2014-03-09 LAB — HOLD TUBE, BLOOD BANK

## 2014-03-09 NOTE — Patient Instructions (Signed)

## 2014-03-10 ENCOUNTER — Telehealth: Payer: Self-pay | Admitting: *Deleted

## 2014-03-10 ENCOUNTER — Ambulatory Visit (HOSPITAL_BASED_OUTPATIENT_CLINIC_OR_DEPARTMENT_OTHER): Payer: Medicare Other

## 2014-03-10 VITALS — BP 142/55 | HR 86 | Temp 98.0°F | Resp 18

## 2014-03-10 DIAGNOSIS — D509 Iron deficiency anemia, unspecified: Secondary | ICD-10-CM

## 2014-03-10 DIAGNOSIS — D500 Iron deficiency anemia secondary to blood loss (chronic): Secondary | ICD-10-CM | POA: Diagnosis not present

## 2014-03-10 DIAGNOSIS — D508 Other iron deficiency anemias: Secondary | ICD-10-CM | POA: Diagnosis not present

## 2014-03-10 DIAGNOSIS — D638 Anemia in other chronic diseases classified elsewhere: Secondary | ICD-10-CM | POA: Diagnosis not present

## 2014-03-10 MED ORDER — SODIUM CHLORIDE 0.9 % IV SOLN
250.0000 mL | Freq: Once | INTRAVENOUS | Status: AC
  Administered 2014-03-10: 250 mL via INTRAVENOUS

## 2014-03-10 NOTE — Telephone Encounter (Signed)
Per staff message and POF I have scheduled appts. Advised scheduler of appts. JMW  

## 2014-03-10 NOTE — Patient Instructions (Signed)

## 2014-03-11 LAB — TYPE AND SCREEN
ABO/RH(D): O POS
ANTIBODY SCREEN: NEGATIVE
UNIT DIVISION: 0
UNIT DIVISION: 0

## 2014-03-15 ENCOUNTER — Telehealth: Payer: Self-pay | Admitting: *Deleted

## 2014-03-15 ENCOUNTER — Telehealth: Payer: Self-pay | Admitting: Hematology and Oncology

## 2014-03-15 ENCOUNTER — Encounter: Payer: Self-pay | Admitting: *Deleted

## 2014-03-15 NOTE — Telephone Encounter (Signed)
adjusted appts per Tammy...Marland KitchenMarland KitchenTammy will advise pt on appts...Marland KitchenMarland Kitchen

## 2014-03-15 NOTE — Telephone Encounter (Signed)
Son called because he noted a 2/25 appt and he was not sure what it was for. Pt to have lab/MD and possible transfusion. Son is concerned that she might need blood prior to 2/25. Discussed keeping an eye on her and calling us if she is symptomatic. Son states he will do that and is aware that we have the "symptom mgmt clinic" if she needs to be seen sooner. Will keep in touch with [place]

## 2014-04-01 ENCOUNTER — Ambulatory Visit (HOSPITAL_BASED_OUTPATIENT_CLINIC_OR_DEPARTMENT_OTHER): Payer: Medicare Other

## 2014-04-01 ENCOUNTER — Ambulatory Visit (HOSPITAL_BASED_OUTPATIENT_CLINIC_OR_DEPARTMENT_OTHER): Payer: Medicare Other | Admitting: Hematology and Oncology

## 2014-04-01 ENCOUNTER — Telehealth: Payer: Self-pay | Admitting: *Deleted

## 2014-04-01 ENCOUNTER — Ambulatory Visit: Payer: Medicare Other

## 2014-04-01 ENCOUNTER — Telehealth: Payer: Self-pay | Admitting: Hematology and Oncology

## 2014-04-01 ENCOUNTER — Other Ambulatory Visit (HOSPITAL_BASED_OUTPATIENT_CLINIC_OR_DEPARTMENT_OTHER): Payer: Medicare Other

## 2014-04-01 ENCOUNTER — Other Ambulatory Visit: Payer: Self-pay | Admitting: Hematology and Oncology

## 2014-04-01 VITALS — BP 164/73 | HR 75 | Temp 97.8°F | Resp 18

## 2014-04-01 VITALS — BP 131/52 | HR 98 | Temp 97.1°F | Resp 18 | Ht 59.5 in | Wt 190.8 lb

## 2014-04-01 DIAGNOSIS — K31811 Angiodysplasia of stomach and duodenum with bleeding: Secondary | ICD-10-CM

## 2014-04-01 DIAGNOSIS — D508 Other iron deficiency anemias: Secondary | ICD-10-CM | POA: Diagnosis not present

## 2014-04-01 DIAGNOSIS — K31819 Angiodysplasia of stomach and duodenum without bleeding: Secondary | ICD-10-CM

## 2014-04-01 DIAGNOSIS — D638 Anemia in other chronic diseases classified elsewhere: Secondary | ICD-10-CM | POA: Diagnosis not present

## 2014-04-01 DIAGNOSIS — D539 Nutritional anemia, unspecified: Secondary | ICD-10-CM

## 2014-04-01 DIAGNOSIS — D500 Iron deficiency anemia secondary to blood loss (chronic): Secondary | ICD-10-CM | POA: Diagnosis not present

## 2014-04-01 LAB — CBC & DIFF AND RETIC
BASO%: 0.2 % (ref 0.0–2.0)
Basophils Absolute: 0 10*3/uL (ref 0.0–0.1)
EOS%: 1.2 % (ref 0.0–7.0)
Eosinophils Absolute: 0.1 10*3/uL (ref 0.0–0.5)
HCT: 21.8 % — ABNORMAL LOW (ref 34.8–46.6)
HGB: 6.5 g/dL — CL (ref 11.6–15.9)
IMMATURE RETIC FRACT: 16 % — AB (ref 1.60–10.00)
LYMPH#: 0.5 10*3/uL — AB (ref 0.9–3.3)
LYMPH%: 10 % — ABNORMAL LOW (ref 14.0–49.7)
MCH: 25.7 pg (ref 25.1–34.0)
MCHC: 29.8 g/dL — AB (ref 31.5–36.0)
MCV: 86.2 fL (ref 79.5–101.0)
MONO#: 0.6 10*3/uL (ref 0.1–0.9)
MONO%: 11 % (ref 0.0–14.0)
NEUT#: 4 10*3/uL (ref 1.5–6.5)
NEUT%: 77.6 % — AB (ref 38.4–76.8)
Platelets: 213 10*3/uL (ref 145–400)
RBC: 2.53 10*6/uL — AB (ref 3.70–5.45)
RDW: 15 % — ABNORMAL HIGH (ref 11.2–14.5)
Retic %: 2.44 % — ABNORMAL HIGH (ref 0.70–2.10)
Retic Ct Abs: 61.73 10*3/uL (ref 33.70–90.70)
WBC: 5.1 10*3/uL (ref 3.9–10.3)

## 2014-04-01 LAB — FERRITIN CHCC: Ferritin: 12 ng/ml (ref 9–269)

## 2014-04-01 LAB — HOLD TUBE, BLOOD BANK

## 2014-04-01 LAB — PREPARE RBC (CROSSMATCH)

## 2014-04-01 MED ORDER — SODIUM CHLORIDE 0.9 % IV SOLN
250.0000 mL | Freq: Once | INTRAVENOUS | Status: AC
  Administered 2014-04-01: 250 mL via INTRAVENOUS

## 2014-04-01 NOTE — Telephone Encounter (Signed)
Gave avs & calendar for March. °

## 2014-04-01 NOTE — Assessment & Plan Note (Signed)
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 She is on high-dose proton pump inhibitor for this. She has appointment to see Dr. Olevia Perches. I will alert Dr. Olevia Perches about the current situation and hopefully she can move forward with EGD and colonoscopy in the near future.

## 2014-04-01 NOTE — Telephone Encounter (Signed)
Per staff phone call and POF I have schedueld appts. Scheduler advised of appts and to move lab appt  JMW

## 2014-04-01 NOTE — Progress Notes (Signed)
[facility] OFFICE PROGRESS NOTE  Horatio Pel, MD SUMMARY OF HEMATOLOGIC HISTORY:  She was found to have abnormal CBC from recent blood count monitoring. The patient have chronic iron deficiency anemia due to recurrent GI bleed. 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 She had received intravenous iron infusions in the past but that was discontinued when her iron studies came back high. She was placed on oral iron supplement for a while. On 08/25/2013, she received one dose of intravenous iron. On 09/30/2013, bone marrow biopsy was done which showed no evidence to suggest myelodysplastic syndrome INTERVAL HISTORY: Sophia [name] 81 y.o. female returns for further follow-up. She has severe, recurrent iron deficiency anemia. The patient noted recently she have hematochezia. She say she have hemorrhoidal bleeding. She denies reflux symptoms. The patient denies any recent signs or symptoms of bleeding such as spontaneous epistaxis or hematuria.   I have reviewed the past medical history, past surgical history, [name]; risedronate sodium; sertraline; fosamax ; lipitor; pregabalin; tolterodine tartrate; and wasp venom.  MEDICATIONS:  Current Outpatient Prescriptions  Medication Sig Dispense Refill  . ALPRAZolam (XANAX) 0.5 MG tablet 1/2- 1 tab by mouth three times a day as needed    . Artificial Tear Solution SOLN Place 1 each into the right eye daily.    [name] buPROPion (WELLBUTRIN SR) 150 MG 12 hr tablet Take 150 mg by mouth 2 (two) times daily.    . calcium citrate-vitamin D (CITRACAL+D) 315-200 MG-UNIT per tablet 1 tablet. Take 3 tablets by mouth once daily    . Cholecalciferol (VITAMIN D3) 1000 UNITS CAPS Take by mouth. Take 1 tablet by mouth once daily     . denosumab (PROLIA) 60  MG/ML SOLN Inject 60 mg into the skin once. One shot every 6 months.     . dexlansoprazole (DEXILANT) 60 MG capsule Take 1 capsule (60 mg total) by mouth daily. 90 capsule 1  . fluorometholone (FML) 0.1 % ophthalmic suspension     . FLUOROMETHOLONE ACETATE OP Place 1 drop into the left eye 3 (three) times daily.    . folic acid (FOLVITE) 800 MCG tablet Take 800 mcg by mouth daily.    . furosemide (LASIX) 40 MG tablet Take 40 mg by mouth every other day.     . gabapentin (NEURONTIN) 400 MG tablet Take 400 mg by mouth 2 (two) times daily.     [name] HYDROcodone-acetaminophen (NORCO/VICODIN) 5-325 MG per tablet Take 1 tablet by mouth every 6 (six) hours as needed for pain.    . iron dextran complex (INFED) 50 MG/ML injection Iron infusion- Infed infustion over 4 hours of pharmacy calculated dose. No test dose needed. Weight-187 lbs Height - 5'.  Pt took 09-2011    . verapamil (VERELAN PM) 240 MG 24 hr capsule Take 1 tablet by mouth daily    . zolpidem (AMBIEN) 5 MG tablet Take 5 mg by mouth at bedtime as needed for sleep.    [name] EPINEPHrine (EPIPEN JR) 0.15 MG/0.3ML injection Inject 0.3 mg into the muscle as needed for anaphylaxis.     No current facility-administered medications for this visit.     REVIEW OF SYSTEMS:   Constitutional: Denies fevers, [name], nose, mouth, throat, [name],  [name], [name], [name], [name], no new changes  All other systems were reviewed with the patient and are negative.  PHYSICAL EXAMINATION: ECOG PERFORMANCE STATUS: 2 - Symptomatic, <50%  confined to bed  Filed Vitals:   04/01/14 0953  BP: 131/52  Pulse: 98  Temp: 97.1 F (36.2 C)  Resp: 18   Filed Weights   04/01/14 0953  Weight: 190 lb 12.8 oz (86.546 kg)    GENERAL:alert, [name] is pale, texture, turgor are normal, [name], Conjunctiva are pale and non-injected, [name], no focal motor/sensory deficits  LABORATORY DATA:  I have reviewed the data as listed Results for orders placed or performed in visit on 04/01/14 (from the past 48 hour(s))  CBC & Diff and Retic     Status: Abnormal   Collection Time: 04/01/14  9:35 AM  Result Value Ref Range   WBC 5.1 3.9 - 10.3 10e3/uL   NEUT# 4.0 1.5 - 6.5 10e3/uL   HGB 6.5 (LL) 11.6 - 15.9 g/dL   HCT 21.8 (L) 34.8 - 46.6 %   Platelets 213 145 - 400 10e3/uL   MCV 86.2 79.5 - 101.0 fL   MCH 25.7 25.1 - 34.0 pg   MCHC 29.8 (L) 31.5 - 36.0 g/dL   RBC 2.53 (L) 3.70 - 5.45 10e6/uL   RDW 15.0 (H) 11.2 - 14.5 %   lymph# 0.5 (L) 0.9 - 3.3 10e3/uL   MONO# 0.6 0.1 - 0.9 10e3/uL   Eosinophils Absolute 0.1 0.0 - 0.5 10e3/uL   Basophils Absolute 0.0 0.0 - 0.1 10e3/uL   NEUT% 77.6 (H) 38.4 - 76.8 %   LYMPH% 10.0 (L) 14.0 - 49.7 %   MONO% 11.0 0.0 - 14.0 %   EOS% 1.2 0.0 - 7.0 %   BASO% 0.2 0.0 - 2.0 %   Retic % 2.44 (H) 0.70 - 2.10 %   Retic Ct Abs 61.73 33.70 - 90.70 10e3/uL   Immature Retic Fract 16.00 (H) 1.60 - 10.00 %    Lab Results  Component Value Date   WBC 5.1 04/01/2014   HGB 6.5* 04/01/2014   HCT 21.8* 04/01/2014   MCV 86.2 04/01/2014   PLT 213 04/01/2014   ASSESSMENT & PLAN:  Other iron deficiency anemias The primary suspect is severe GI bleed from underlying GA VE. We discussed some of the risks, benefits, and alternatives of blood transfusions. The patient is symptomatic from anemia and the hemoglobin level is critically  low.  Some of the side-effects to be expected including risks of transfusion reactions, chills, infection, syndrome of volume overload and risk of hospitalization from various reasons and the patient is willing to proceed and went ahead to sign consent today. She will receive 2 units of blood transfusion whenever hemoglobin is less than 8 g. Recent iron studies showed low ferritin. There is no doubt she has persistent and recurrent GI bleed After iron infusion, she had marked improvement of anemia with high ferritin level and since then it has dropped again. I recommend she resumed iron supplement daily but she tolerated that very poorly. We discussed some of the risks, benefits,  and alternatives of intravenous iron infusions. The patient is symptomatic from anemia and the iron level is critically low. She tolerated oral iron supplement poorly and desires to achieved higher levels of iron faster for adequate hematopoesis. Some of the side-effects to be expected including risks of infusion reactions, phlebitis, headaches, nausea and fatigue.  The patient is willing to proceed. Patient education material was dispensed.  Goal is to keep ferritin level greater than 100. She will get iron infusion tomorrow and next week. She will get ferritin level checked on a regular basis.   GAVE (gastric antral vascular ectasia) She had recurrent GI bleed from GAVE. She is on high-dose proton pump inhibitor for this. She has appointment to see Dr. Olevia Perches. I will alert Dr. Olevia Perches about the current situation and hopefully she can move forward with EGD and colonoscopy in the near future.    All questions were answered. The patient knows to call the clinic with any problems, questions or concerns. No barriers to learning was detected.  I spent 25 minutes counseling the patient face to face. The total time spent in the appointment was 30 minutes and more than 50% was on counseling.     [facility], Nyshaun Standage,  MD 2/25/201610:24 AM

## 2014-04-01 NOTE — Patient Instructions (Signed)

## 2014-04-01 NOTE — Telephone Encounter (Signed)
Open by miskae 

## 2014-04-01 NOTE — Assessment & Plan Note (Addendum)
The primary suspect is severe GI bleed from underlying GA VE. We discussed some of the risks, benefits, and alternatives of blood transfusions. The patient is symptomatic from anemia and the hemoglobin level is critically low.  Some of the side-effects to be expected including risks of transfusion reactions, chills, infection, syndrome of volume overload and risk of hospitalization from various reasons and the patient is willing to proceed and went ahead to sign consent today. She will receive 2 units of blood transfusion whenever hemoglobin is less than 8 g. Recent iron studies showed low ferritin. There is no doubt she has persistent and recurrent GI bleed After iron infusion, she had marked improvement of anemia with high ferritin level and since then it has dropped again. I recommend she resumed iron supplement daily but she tolerated that very poorly. We discussed some of the risks, benefits, and alternatives of intravenous iron infusions. The patient is symptomatic from anemia and the iron level is critically low. She tolerated oral iron supplement poorly and desires to achieved higher levels of iron faster for adequate hematopoesis. Some of the side-effects to be expected including risks of infusion reactions, phlebitis, headaches, nausea and fatigue.  The patient is willing to proceed. Patient education material was dispensed.  Goal is to keep ferritin level greater than 100. She will get iron infusion tomorrow and next week. She will get ferritin level checked on a regular basis.

## 2014-04-02 ENCOUNTER — Ambulatory Visit (HOSPITAL_BASED_OUTPATIENT_CLINIC_OR_DEPARTMENT_OTHER): Payer: Medicare Other

## 2014-04-02 DIAGNOSIS — D508 Other iron deficiency anemias: Secondary | ICD-10-CM

## 2014-04-02 DIAGNOSIS — D539 Nutritional anemia, unspecified: Secondary | ICD-10-CM

## 2014-04-02 DIAGNOSIS — K31811 Angiodysplasia of stomach and duodenum with bleeding: Secondary | ICD-10-CM | POA: Diagnosis not present

## 2014-04-02 DIAGNOSIS — K31819 Angiodysplasia of stomach and duodenum without bleeding: Secondary | ICD-10-CM

## 2014-04-02 LAB — TYPE AND SCREEN
ABO/RH(D): O POS
ANTIBODY SCREEN: NEGATIVE
UNIT DIVISION: 0
Unit division: 0

## 2014-04-02 MED ORDER — SODIUM CHLORIDE 0.9 % IV SOLN
510.0000 mg | Freq: Once | INTRAVENOUS | Status: AC
  Administered 2014-04-02: 510 mg via INTRAVENOUS
  Filled 2014-04-02: qty 17

## 2014-04-02 MED ORDER — SODIUM CHLORIDE 0.9 % IV SOLN
Freq: Once | INTRAVENOUS | Status: AC
  Administered 2014-04-02: 11:00:00 via INTRAVENOUS

## 2014-04-02 NOTE — Patient Instructions (Signed)

## 2014-04-05 ENCOUNTER — Ambulatory Visit: Payer: Medicare Other | Admitting: Hematology and Oncology

## 2014-04-06 ENCOUNTER — Ambulatory Visit (HOSPITAL_COMMUNITY)
Admission: RE | Admit: 2014-04-06 | Discharge: 2014-04-06 | Disposition: A | Discharge status: Home / Self Care | Payer: Medicare Other | Source: Ambulatory Visit | Attending: Hematology and Oncology | Admitting: Hematology and Oncology

## 2014-04-08 ENCOUNTER — Other Ambulatory Visit (HOSPITAL_BASED_OUTPATIENT_CLINIC_OR_DEPARTMENT_OTHER): Payer: Medicare Other

## 2014-04-08 ENCOUNTER — Other Ambulatory Visit: Payer: Self-pay | Admitting: Hematology and Oncology

## 2014-04-08 ENCOUNTER — Ambulatory Visit (HOSPITAL_BASED_OUTPATIENT_CLINIC_OR_DEPARTMENT_OTHER): Payer: Medicare Other

## 2014-04-08 DIAGNOSIS — K31819 Angiodysplasia of stomach and duodenum without bleeding: Secondary | ICD-10-CM

## 2014-04-08 DIAGNOSIS — D508 Other iron deficiency anemias: Secondary | ICD-10-CM

## 2014-04-08 DIAGNOSIS — K31811 Angiodysplasia of stomach and duodenum with bleeding: Secondary | ICD-10-CM | POA: Diagnosis not present

## 2014-04-08 DIAGNOSIS — D539 Nutritional anemia, unspecified: Secondary | ICD-10-CM

## 2014-04-08 LAB — CBC & DIFF AND RETIC
BASO%: 0.2 % (ref 0.0–2.0)
Basophils Absolute: 0 10*3/uL (ref 0.0–0.1)
EOS%: 1.2 % (ref 0.0–7.0)
Eosinophils Absolute: 0.1 10*3/uL (ref 0.0–0.5)
HEMATOCRIT: 34.8 % (ref 34.8–46.6)
HEMOGLOBIN: 10.7 g/dL — AB (ref 11.6–15.9)
Immature Retic Fract: 12.9 % — ABNORMAL HIGH (ref 1.60–10.00)
LYMPH%: 13.3 % — AB (ref 14.0–49.7)
MCH: 27.3 pg (ref 25.1–34.0)
MCHC: 30.7 g/dL — AB (ref 31.5–36.0)
MCV: 88.8 fL (ref 79.5–101.0)
MONO#: 0.5 10*3/uL (ref 0.1–0.9)
MONO%: 8.6 % (ref 0.0–14.0)
NEUT#: 4.6 10*3/uL (ref 1.5–6.5)
NEUT%: 76.7 % (ref 38.4–76.8)
PLATELETS: 255 10*3/uL (ref 145–400)
RBC: 3.92 10*6/uL (ref 3.70–5.45)
RDW: 17 % — ABNORMAL HIGH (ref 11.2–14.5)
RETIC %: 2.2 % — AB (ref 0.70–2.10)
Retic Ct Abs: 86.24 10*3/uL (ref 33.70–90.70)
WBC: 6 10*3/uL (ref 3.9–10.3)
lymph#: 0.8 10*3/uL — ABNORMAL LOW (ref 0.9–3.3)

## 2014-04-08 LAB — FERRITIN CHCC: Ferritin: 328 ng/ml — ABNORMAL HIGH (ref 9–269)

## 2014-04-08 LAB — HOLD TUBE, BLOOD BANK

## 2014-04-08 MED ORDER — SODIUM CHLORIDE 0.9 % IV SOLN
510.0000 mg | Freq: Once | INTRAVENOUS | Status: AC
  Administered 2014-04-08: 510 mg via INTRAVENOUS
  Filled 2014-04-08: qty 17

## 2014-04-08 MED ORDER — SODIUM CHLORIDE 0.9 % IV SOLN
Freq: Once | INTRAVENOUS | Status: AC
  Administered 2014-04-08: 13:00:00 via INTRAVENOUS

## 2014-04-08 NOTE — Patient Instructions (Signed)

## 2014-04-09 ENCOUNTER — Encounter (HOSPITAL_COMMUNITY): Payer: Self-pay | Admitting: *Deleted

## 2014-04-09 ENCOUNTER — Ambulatory Visit (INDEPENDENT_AMBULATORY_CARE_PROVIDER_SITE_OTHER): Payer: Medicare Other | Admitting: Internal Medicine

## 2014-04-09 ENCOUNTER — Encounter: Payer: Self-pay | Admitting: Internal Medicine

## 2014-04-09 VITALS — BP 124/60 | HR 76 | Ht 59.5 in | Wt 190.2 lb

## 2014-04-09 DIAGNOSIS — I350 Nonrheumatic aortic (valve) stenosis: Secondary | ICD-10-CM | POA: Diagnosis not present

## 2014-04-09 DIAGNOSIS — K31811 Angiodysplasia of stomach and duodenum with bleeding: Secondary | ICD-10-CM

## 2014-04-09 DIAGNOSIS — D649 Anemia, unspecified: Secondary | ICD-10-CM

## 2014-04-09 MED ORDER — DEXLANSOPRAZOLE 60 MG PO CPDR: 60.0000 mg | DELAYED_RELEASE_CAPSULE | Freq: Every day | ORAL | Status: DC

## 2014-04-09 NOTE — Progress Notes (Signed)
Sophia [name] [birth year] [id number]  Note: This dictation was prepared with [name] digital system. Any transcriptional errors that result from this procedure are unintentional.   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 Last iron infusion on 04/01/2014 and blood transfusion on 03/09/2014. Last hemoglobin on 04/08/2014 was 10.7 hematocrit 34.8. We have known her since 1999 when she developed was evaluated for Hemoccult positive stool by Dr Sophia [name] with upper endoscopy and colonoscopy which showed internal hemorrhoids, diverticulosis and small hiatal hernia. Second colonoscopy was in 2009- cause for blood loss found.[name] Upper endoscopy and colonoscopy in again in February 2012 revealed hemorrhagic gastritis c/w  watermelon stomach. Biopsies of the gastric mucosa showed reactive gastropathy. Small bowel biopsies were negative for sprue. Hemoglobin at that time dropped to 5.4. She had sessile hyperplastic polyp in 03/2010.[name] Small bowel capsule endoscopy in February 2012 showed hemorrhagic gastritis and duodenitis with small excavated lesion at 1 hour 34 minutes with a dark pigment question of an ulcer. She was at that time on [name] and  [name].was discontinued because of that. This was discontinued. She underwent APC ablation of vascular ectasias in 2014 and responded well with stable hemoglobin  for some time.. Recently her hemoglobin has been dropping again without her having any abdominal pain, dyspepsia, . She is on Dexilant  60 mg every morning with complete relief of Sx,s ofgastroesophageal reflux     Past Medical History  Diagnosis Date  . Congestive heart failure   . Aortic stenosis   . Hypertension   . Dyslipidemia   . Depression   . Duodenitis 03/15/10    per capsule endoscopy report  . Hemorrhagic gastritis 03/15/10    per capsule endoscopy  report  . Diverticulosis 03/14/10  . Hyperplastic colon polyp   . Anemia   . DVT (deep venous thrombosis)   . Anxiety   . Arthritis   . Blood transfusion   . Cataracts, bilateral     removed  . GERD (gastroesophageal reflux disease)   . Heart murmur   . Hyperlipidemia   . Hiatal hernia   . Osteoporosis   . Neuropathy     bil big toes  . Presbyesophagus   . Unspecified deficiency anemia 08/24/2013    Past Surgical History  Procedure Laterality Date  . Cholecystectomy    . Orif wrist fracture    . Fracture left knee  1993    x 2   . Foot surgery  1998  . Tubal ligation    . Plantar fasciitis repair      left  . Cataract extraction, bilateral    . Esophagogastroduodenoscopy N/A 04/28/2012    Procedure: ESOPHAGOGASTRODUODENOSCOPY (EGD);  Surgeon: Sophia Dragon, MD;  Location: [facility];  Service: Endoscopy;  Laterality: N/A;  . Hot hemostasis N/A 04/28/2012    Procedure: HOT HEMOSTASIS (ARGON PLASMA COAGULATION/BICAP);  Surgeon: Sophia Dragon, MD;  Location: [facility];  Service: Endoscopy;  Laterality: N/A;    Allergies  Allergen Reactions  . Nutritional Supplements Anaphylaxis  . [name] Shortness Of Breath  . Sertraline Shortness Of Breath  . [name]  [Alendronate Sodium] Other (See Comments)    GI Upset  . Lipitor [Atorvastatin] Other (See Comments)    Muscle aches  . Pregabalin Nausea And Vomiting  . [name] Nausea And Vomiting  . [place]  history and social history have been reviewed.  Review of Systems: Negative for dysphagia, heartburn, abdominal pain eyes of black stools  The remainder of the 10 point ROS is negative except as outlined in the H&P  Physical Exam: General Appearance Well developed, in no distress, overweight  Eyes  Non icteric  HEENT  Non traumatic, normocephalic  Mouth No lesion, tongue papillated, no cheilosis Neck Supple without adenopathy, thyroid not enlarged, 2+  carotid bruits, no  JVD Lungs Clear to auscultation bilaterally COR Normal S1, normal S2, regular rhythm, 3/6 systolic  murmur, quiet precordium Abdomen . Abdomen. Nontender. Liver edge at costal margin. No ascites  Rectal Soft brown strongly Hemoccult-positive stool  Extremities1+  pedal edema, ambulates with walker  Skin No lesions Neurological Alert and oriented x 3 Psychological Normal mood and affect  Assessment and Plan:   81 year old white female with aortic stenosis and angiodysplasia. 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 ( GAVE) Continued chronic GI blood loss with recent acceleration. Requiring intravenous iron infusions as well as blood transfusions. Her bone marrow biopsies show no evidence of myelodysplastic syndrome. She did well  in the past from [facility] argon laser ablation of the AV malformations and we will do the same next week. She and her son agreed to schedule upper endoscopy at [facility] APC ablation of  AV malformations. Continue on Dexilant  60 mg daily. She is actually up-to-date on colonoscopy and she is rather frail to for me to consider colonoscopy at the same day with upper endoscopy so we will do the upper endoscopy with APC ablation first and see how she does before considering colonoscopy  CC Dr Sophia [name], Dr Sophia [name]  Sophia [name] 04/09/2014

## 2014-04-09 NOTE — Patient Instructions (Addendum)
You have been scheduled for an endoscopy at [facility] 04-12-14 at 12:30. Please follow written instructions given to you at your visit today. If you use inhalers (even only as needed), please bring them with you on the day of your procedure.  We have sent medications to your pharmacy for you to pick up at your convenience Dr Alvy Bimler, Dr Shelia Media

## 2014-04-12 ENCOUNTER — Encounter (HOSPITAL_COMMUNITY): Payer: Self-pay | Admitting: Gastroenterology

## 2014-04-12 ENCOUNTER — Ambulatory Visit (HOSPITAL_COMMUNITY)
Admission: RE | Admit: 2014-04-12 | Discharge: 2014-04-12 | Disposition: A | Discharge status: Home / Self Care | Payer: Medicare Other | Source: Ambulatory Visit | Attending: Internal Medicine | Admitting: Internal Medicine

## 2014-04-12 ENCOUNTER — Ambulatory Visit (HOSPITAL_COMMUNITY): Payer: Medicare Other | Admitting: Anesthesiology

## 2014-04-12 ENCOUNTER — Encounter (HOSPITAL_COMMUNITY)
Admission: RE | Disposition: A | Discharge status: Home / Self Care | Payer: Self-pay | Source: Ambulatory Visit | Attending: Internal Medicine

## 2014-04-12 DIAGNOSIS — Z87891 Personal history of nicotine dependence: Secondary | ICD-10-CM | POA: Diagnosis not present

## 2014-04-12 DIAGNOSIS — K449 Diaphragmatic hernia without obstruction or gangrene: Secondary | ICD-10-CM | POA: Insufficient documentation

## 2014-04-12 DIAGNOSIS — I509 Heart failure, unspecified: Secondary | ICD-10-CM | POA: Insufficient documentation

## 2014-04-12 DIAGNOSIS — F329 Major depressive disorder, single episode, unspecified: Secondary | ICD-10-CM | POA: Diagnosis not present

## 2014-04-12 DIAGNOSIS — Z9841 Cataract extraction status, right eye: Secondary | ICD-10-CM | POA: Diagnosis not present

## 2014-04-12 DIAGNOSIS — K228 Other specified diseases of esophagus: Secondary | ICD-10-CM | POA: Diagnosis not present

## 2014-04-12 DIAGNOSIS — F419 Anxiety disorder, unspecified: Secondary | ICD-10-CM | POA: Insufficient documentation

## 2014-04-12 DIAGNOSIS — D649 Anemia, unspecified: Secondary | ICD-10-CM | POA: Diagnosis not present

## 2014-04-12 DIAGNOSIS — I350 Nonrheumatic aortic (valve) stenosis: Secondary | ICD-10-CM | POA: Insufficient documentation

## 2014-04-12 DIAGNOSIS — K31819 Angiodysplasia of stomach and duodenum without bleeding: Secondary | ICD-10-CM | POA: Diagnosis not present

## 2014-04-12 DIAGNOSIS — Z9842 Cataract extraction status, left eye: Secondary | ICD-10-CM | POA: Insufficient documentation

## 2014-04-12 DIAGNOSIS — E785 Hyperlipidemia, unspecified: Secondary | ICD-10-CM | POA: Insufficient documentation

## 2014-04-12 DIAGNOSIS — K9289 Other specified diseases of the digestive system: Secondary | ICD-10-CM | POA: Insufficient documentation

## 2014-04-12 DIAGNOSIS — I10 Essential (primary) hypertension: Secondary | ICD-10-CM | POA: Diagnosis not present

## 2014-04-12 DIAGNOSIS — M810 Age-related osteoporosis without current pathological fracture: Secondary | ICD-10-CM | POA: Diagnosis not present

## 2014-04-12 DIAGNOSIS — K219 Gastro-esophageal reflux disease without esophagitis: Secondary | ICD-10-CM | POA: Diagnosis not present

## 2014-04-12 DIAGNOSIS — Z86718 Personal history of other venous thrombosis and embolism: Secondary | ICD-10-CM | POA: Insufficient documentation

## 2014-04-12 DIAGNOSIS — Z86010 Personal history of colonic polyps: Secondary | ICD-10-CM | POA: Insufficient documentation

## 2014-04-12 DIAGNOSIS — Z9049 Acquired absence of other specified parts of digestive tract: Secondary | ICD-10-CM | POA: Insufficient documentation

## 2014-04-12 DIAGNOSIS — K31811 Angiodysplasia of stomach and duodenum with bleeding: Secondary | ICD-10-CM | POA: Diagnosis not present

## 2014-04-12 DIAGNOSIS — Z888 Allergy status to other drugs, medicaments and biological substances status: Secondary | ICD-10-CM | POA: Insufficient documentation

## 2014-04-12 DIAGNOSIS — D61818 Other pancytopenia: Secondary | ICD-10-CM | POA: Insufficient documentation

## 2014-04-12 HISTORY — PX: ESOPHAGOGASTRODUODENOSCOPY: SHX5428

## 2014-04-12 HISTORY — PX: HOT HEMOSTASIS: SHX5433

## 2014-04-12 SURGERY — EGD (ESOPHAGOGASTRODUODENOSCOPY)
Anesthesia: Monitor Anesthesia Care

## 2014-04-12 MED ORDER — PROPOFOL INFUSION 10 MG/ML OPTIME
INTRAVENOUS | Status: DC | PRN
  Administered 2014-04-12: 100 ug/kg/min via INTRAVENOUS

## 2014-04-12 MED ORDER — LACTATED RINGERS IV SOLN
INTRAVENOUS | Status: DC
  Administered 2014-04-12: 1000 mL via INTRAVENOUS

## 2014-04-12 MED ORDER — LACTATED RINGERS IV SOLN
INTRAVENOUS | Status: DC | PRN
  Administered 2014-04-12: 12:00:00 via INTRAVENOUS

## 2014-04-12 MED ORDER — ONDANSETRON HCL 4 MG/2ML IJ SOLN
INTRAMUSCULAR | Status: DC | PRN
  Administered 2014-04-12: 4 mg via INTRAVENOUS

## 2014-04-12 MED ORDER — KETAMINE HCL 10 MG/ML IJ SOLN
INTRAMUSCULAR | Status: DC | PRN
  Administered 2014-04-12: 20 mg via INTRAVENOUS

## 2014-04-12 MED ORDER — PROPOFOL 10 MG/ML IV BOLUS
INTRAVENOUS | Status: AC
  Filled 2014-04-12: qty 20

## 2014-04-12 MED ORDER — SODIUM CHLORIDE 0.9 % IV SOLN: INTRAVENOUS | Status: DC

## 2014-04-12 MED ORDER — ONDANSETRON HCL 4 MG/2ML IJ SOLN
INTRAMUSCULAR | Status: AC
  Filled 2014-04-12: qty 2

## 2014-04-12 MED ORDER — PHENYLEPHRINE 40 MCG/ML (10ML) SYRINGE FOR IV PUSH (FOR BLOOD PRESSURE SUPPORT)
PREFILLED_SYRINGE | INTRAVENOUS | Status: AC
Start: 2014-04-12 — End: 2014-04-12
  Filled 2014-04-12: qty 10

## 2014-04-12 NOTE — Op Note (Signed)
[facility] [place], [id number]   ENDOSCOPY PROCEDURE REPORT  PATIENT: Sophia, [name]  MR#: [id number] BIRTHDATE: [birth year] , 85  yrs. old GENDER: female ENDOSCOPIST: Lafayette Dragon, MD REFERRED BY:  Alden Server, M.D. , Dr Alvy Bimler PROCEDURE DATE:  04/12/2014 PROCEDURE:  EGD w/ ablation ASA CLASS:     Class III INDICATIONS:  GAVE syndrome, GI blood loss, prior APC ablation 2014. Recent drop in H/H requiring Iron and Blood transfusions.[name] MEDICATIONS: Monitored anesthesia care TOPICAL ANESTHETIC: none  DESCRIPTION OF PROCEDURE: After the risks benefits and alternatives of the procedure were thoroughly explained, informed consent was obtained.  The Pentax Gastroscope [id number] endoscope was introduced through the mouth and advanced to the second portion of the duodenum , Without limitations.  The instrument was slowly withdrawn as the mucosa was fully examined.    Esophagus: esophagus was intubated without difficulty. The lumen was very tortuous and spastic. There was a 3-4 cm hiatal hernia without [name] erosions. There was no stricture  Stomach: stomach was insufflated with air and showed normal gastric body and rugal pattern. There were extensive vascular ectasias throughout the gastric antrum especially in the prepyloric region .There were circumferential and rather flat lesions. There were ablated with the APC argon laser set at one second pulses of 20 W. An extensive ablation was carried out circumferentially with Straight  as well as circular  probe. Stomach was continuously decompressed to avoid overdistention.  Duodenum: duodenal bulb and descending duodenum was normal[    .there was a small 8 mm nodule in the duodenal bulb which appeared benign and was not biopsied     The scope was then withdrawn from the patient and the procedure completed.  COMPLICATIONS: There were no immediate complications.  ENDOSCOPIC IMPRESSION:  1.  Gave syndrome, involving gastric antrum area extensive ablation of vascular ectasia with APC argon laser 2. Presbyesophagus with spasm and tortuosity of the esophageal lumen 3. Small hiatal hernia 4. 8 mm  submucosal duodenal bulb nodule  ,not biopsied  RECOMMENDATIONS: 1.resume home medications 2. Follow H&H closely 3. Consider repeating procedure in next 4-6 weeks 4. Hold off on colonoscopy since patient is up-to-date and the bleeding is clearly related to the presence of vascular ectasias in the upper GI  tract.  REPEAT EXAM: 6-8 weeks  eSigned:  Lafayette Dragon, MD 04/12/2014 1:40 PM    CC:  PATIENT NAME:  Sophia, [name] MR#: [id number]

## 2014-04-12 NOTE — H&P (View-Only) (Signed)
Sophia [name] Dec 22, 1928 [id number]  Note: This dictation was prepared with [name] digital system. Any transcriptional errors that result from this procedure are unintentional.   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 Last iron infusion on 04/01/2014 and blood transfusion on 03/09/2014. Last hemoglobin on 04/08/2014 was 10.7 hematocrit 34.8. We have known her since 1999 when she developed was evaluated for Hemoccult positive stool by Dr Sophia [name] with upper endoscopy and colonoscopy which showed internal hemorrhoids, diverticulosis and small hiatal hernia. Second colonoscopy was in 2009- cause for blood loss found.[name] Upper endoscopy and colonoscopy in again in February 2012 revealed hemorrhagic gastritis c/w  watermelon stomach. Biopsies of the gastric mucosa showed reactive gastropathy. Small bowel biopsies were negative for sprue. Hemoglobin at that time dropped to 5.4. She had sessile hyperplastic polyp in 03/2010.[name] Small bowel capsule endoscopy in February 2012 showed hemorrhagic gastritis and duodenitis with small excavated lesion at 1 hour 34 minutes with a dark pigment question of an ulcer. She was at that time on [name] and  [name].was discontinued because of that. This was discontinued. She underwent APC ablation of vascular ectasias in 2014 and responded well with stable hemoglobin  for some time.. Recently her hemoglobin has been dropping again without her having any abdominal pain, dyspepsia, . She is on Dexilant  60 mg every morning with complete relief of Sx,s ofgastroesophageal reflux     Past Medical History  Diagnosis Date  . Congestive heart failure   . Aortic stenosis   . Hypertension   . Dyslipidemia   . Depression   . Duodenitis 03/15/10    per capsule endoscopy report  . Hemorrhagic gastritis 03/15/10    per capsule endoscopy  report  . Diverticulosis 03/14/10  . Hyperplastic colon polyp   . Anemia   . DVT (deep venous thrombosis)   . Anxiety   . Arthritis   . Blood transfusion   . Cataracts, bilateral     removed  . GERD (gastroesophageal reflux disease)   . Heart murmur   . Hyperlipidemia   . Hiatal hernia   . Osteoporosis   . Neuropathy     bil big toes  . Presbyesophagus   . Unspecified deficiency anemia 08/24/2013    Past Surgical History  Procedure Laterality Date  . Cholecystectomy    . Orif wrist fracture    . Fracture left knee  1993    x 2   . Foot surgery  1998  . Tubal ligation    . Plantar fasciitis repair      left  . Cataract extraction, bilateral    . Esophagogastroduodenoscopy N/A 04/28/2012    Procedure: ESOPHAGOGASTRODUODENOSCOPY (EGD);  Surgeon: Sophia Dragon, MD;  Location: [facility];  Service: Endoscopy;  Laterality: N/A;  . Hot hemostasis N/A 04/28/2012    Procedure: HOT HEMOSTASIS (ARGON PLASMA COAGULATION/BICAP);  Surgeon: Sophia Dragon, MD;  Location: [facility];  Service: Endoscopy;  Laterality: N/A;    Allergies  Allergen Reactions  . Nutritional Supplements Anaphylaxis  . [name] Shortness Of Breath  . Sertraline Shortness Of Breath  . [name]  [Alendronate Sodium] Other (See Comments)    GI Upset  . Lipitor [Atorvastatin] Other (See Comments)    Muscle aches  . Pregabalin Nausea And Vomiting  . [name] Nausea And Vomiting  . [place]  history and social history have been reviewed.  Review of Systems: Negative for dysphagia, heartburn, abdominal pain eyes of black stools  The remainder of the 10 point ROS is negative except as outlined in the H&P  Physical Exam: General Appearance Well developed, in no distress, overweight  Eyes  Non icteric  HEENT  Non traumatic, normocephalic  Mouth No lesion, tongue papillated, no cheilosis Neck Supple without adenopathy, thyroid not enlarged, 2+  carotid bruits, no  JVD Lungs Clear to auscultation bilaterally COR Normal S1, normal S2, regular rhythm, 3/6 systolic  murmur, quiet precordium Abdomen . Abdomen. Nontender. Liver edge at costal margin. No ascites  Rectal Soft brown strongly Hemoccult-positive stool  Extremities1+  pedal edema, ambulates with walker  Skin No lesions Neurological Alert and oriented x 3 Psychological Normal mood and affect  Assessment and Plan:   81 year old white female with aortic stenosis and angiodysplasia. 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 ( GAVE) Continued chronic GI blood loss with recent acceleration. Requiring intravenous iron infusions as well as blood transfusions. Her bone marrow biopsies show no evidence of myelodysplastic syndrome. She did well  in the past from [name] argon laser ablation of the AV malformations and we will do the same next week. She and her son agreed to schedule upper endoscopy at [facility] APC ablation of  AV malformations. Continue on Dexilant  60 mg daily. She is actually up-to-date on colonoscopy and she is rather frail to for me to consider colonoscopy at the same day with upper endoscopy so we will do the upper endoscopy with APC ablation first and see how she does before considering colonoscopy  CC Dr Sophia [name], Dr Sophia [name]  Sophia [name] 04/09/2014

## 2014-04-12 NOTE — Transfer of Care (Signed)
Immediate Anesthesia Transfer of Care Note  Patient: Sophia [name]  Procedure(s) Performed: Procedure(s): ESOPHAGOGASTRODUODENOSCOPY (EGD) (N/A) HOT HEMOSTASIS (ARGON PLASMA COAGULATION/BICAP) (N/A)  Patient Location: Endoscopy Unit  Anesthesia Type:MAC  Level of Consciousness: awake, alert  and oriented  Airway & Oxygen Therapy: Patient Spontanous Breathing and Patient connected to face mask oxygen  Post-op Assessment: Report given to RN  Post vital signs: Reviewed and stable  Last Vitals:  Filed Vitals:   04/12/14 1146  BP: 154/64  Pulse: 81  Temp: 36.3 C  Resp: 13    Complications: No apparent anesthesia complications

## 2014-04-12 NOTE — Discharge Instructions (Signed)
Esophagogastroduodenoscopy °Care After °Refer to this sheet in the next few weeks. These instructions provide you with information on caring for yourself after your procedure. Your caregiver may also give you more specific instructions. Your treatment has been planned according to current medical practices, but problems sometimes occur. Call your caregiver if you have any problems or questions after your procedure.  °HOME CARE INSTRUCTIONS °· Do not eat or drink anything until the numbing medicine (local anesthetic) has worn off and your gag reflex has returned. You will know that the local anesthetic has worn off when you can swallow comfortably. °· Do not drive for 12 hours after the procedure or as directed by your caregiver. °· Only take medicines as directed by your caregiver. °SEEK MEDICAL CARE IF:  °· You cannot stop coughing. °· You are not urinating at all or less than usual. °SEEK IMMEDIATE MEDICAL CARE IF: °· You have difficulty swallowing. °· You cannot eat or drink. °· You have worsening throat or chest pain. °· You have dizziness, lightheadedness, or you faint. °· You have nausea or vomiting. °· You have chills. °· You have a fever. °· You have severe abdominal pain. °· You have black, tarry, or bloody stools. °Document Released: 01/09/2012 Document Reviewed: 01/09/2012 °Exit[employer]® Patient Information ©2015 [employer]. This information is not intended to replace advice given to you by your health care provider. Make sure you discuss any questions you have with your health care provider. ° °

## 2014-04-12 NOTE — Anesthesia Postprocedure Evaluation (Signed)
  Anesthesia Post-op Note  Patient: Sophia [name]  Procedure(s) Performed: Procedure(s) (LRB): ESOPHAGOGASTRODUODENOSCOPY (EGD) (N/A) HOT HEMOSTASIS (ARGON PLASMA COAGULATION/BICAP) (N/A)  Patient Location: PACU  Anesthesia Type: MAC  Level of Consciousness: awake and alert   Airway and Oxygen Therapy: Patient Spontanous Breathing  Post-op Pain: mild  Post-op Assessment: Post-op Vital signs reviewed, Patient's Cardiovascular Status Stable, Respiratory Function Stable, Patent Airway and No signs of Nausea or vomiting  Last Vitals:  Filed Vitals:   04/12/14 1400  BP: 152/67  Pulse: 69  Temp:   Resp: 17    Post-op Vital Signs: stable   Complications: No apparent anesthesia complications

## 2014-04-12 NOTE — Anesthesia Preprocedure Evaluation (Addendum)
Anesthesia Evaluation  Patient identified by MRN, date of birth, ID band Patient awake    Reviewed: Allergy & Precautions, NPO status , Patient's Chart, lab work & pertinent test results  Airway Mallampati: II  TM Distance: >3 FB Neck ROM: Full    Dental no notable dental hx. (+) Edentulous Upper, Edentulous Lower   Pulmonary neg pulmonary ROS, former smoker,  breath sounds clear to auscultation  Pulmonary exam normal       Cardiovascular hypertension, +CHF + Valvular Problems/Murmurs AS Rhythm:Regular Rate:Normal     Neuro/Psych negative neurological ROS  negative psych ROS   GI/Hepatic Neg liver ROS, hiatal hernia, GERD-  ,  Endo/Other  negative endocrine ROS  Renal/GU negative Renal ROS  negative genitourinary   Musculoskeletal negative musculoskeletal ROS (+)   Abdominal   Peds negative pediatric ROS (+)  Hematology negative hematology ROS (+)   Anesthesia Other Findings   Reproductive/Obstetrics negative OB ROS                            Anesthesia Physical Anesthesia Plan  ASA: III  Anesthesia Plan: MAC   Post-op Pain Management:    Induction:   Airway Management Planned:   Additional Equipment:   Intra-op Plan:   Post-operative Plan:   Informed Consent: I have reviewed the patients History and Physical, chart, labs and discussed the procedure including the risks, benefits and alternatives for the proposed anesthesia with the patient or authorized representative who has indicated his/her understanding and acceptance.   Dental advisory given  Plan Discussed with: CRNA  Anesthesia Plan Comments:         Anesthesia Quick Evaluation

## 2014-04-12 NOTE — Interval H&P Note (Signed)
History and Physical Interval Note:  04/12/2014 9:53 AM  Sophia [name]  has presented today for surgery, with the diagnosis of .  The various methods of treatment have been discussed with the patient and family. After consideration of risks, benefits and other options for treatment, the patient has consented to  Procedure(s): ESOPHAGOGASTRODUODENOSCOPY (EGD) (N/A) HOT HEMOSTASIS (ARGON PLASMA COAGULATION/BICAP) (N/A) as a surgical intervention .  The patient's history has been reviewed, patient examined, no change in status, stable for surgery.  I have reviewed the patient's chart and labs.  Questions were answered to the patient's satisfaction.     [name]

## 2014-04-12 NOTE — Anesthesia Postprocedure Evaluation (Signed)
  Anesthesia Post-op Note  Patient: Sophia [name]  Procedure(s) Performed: Procedure(s) (LRB): ESOPHAGOGASTRODUODENOSCOPY (EGD) (N/A) HOT HEMOSTASIS (ARGON PLASMA COAGULATION/BICAP) (N/A)  Patient Location: PACU  Anesthesia Type: MAC  Level of Consciousness: awake and alert   Airway and Oxygen Therapy: Patient Spontanous Breathing  Post-op Pain: mild  Post-op Assessment: Post-op Vital signs reviewed, Patient's Cardiovascular Status Stable, Respiratory Function Stable, Patent Airway and No signs of Nausea or vomiting  Last Vitals:  Filed Vitals:   04/12/14 1334  BP: 124/67  Pulse: 70  Temp:   Resp: 17    Post-op Vital Signs: stable   Complications: No apparent anesthesia complications

## 2014-04-13 ENCOUNTER — Encounter (HOSPITAL_COMMUNITY): Payer: Self-pay | Admitting: Internal Medicine

## 2014-04-16 ENCOUNTER — Other Ambulatory Visit: Payer: Self-pay | Admitting: Hematology and Oncology

## 2014-04-16 ENCOUNTER — Other Ambulatory Visit (HOSPITAL_BASED_OUTPATIENT_CLINIC_OR_DEPARTMENT_OTHER): Payer: Medicare Other

## 2014-04-16 ENCOUNTER — Ambulatory Visit: Payer: Medicare Other

## 2014-04-16 DIAGNOSIS — K31819 Angiodysplasia of stomach and duodenum without bleeding: Secondary | ICD-10-CM | POA: Diagnosis not present

## 2014-04-16 DIAGNOSIS — D508 Other iron deficiency anemias: Secondary | ICD-10-CM | POA: Diagnosis not present

## 2014-04-16 DIAGNOSIS — D539 Nutritional anemia, unspecified: Secondary | ICD-10-CM

## 2014-04-16 LAB — CBC & DIFF AND RETIC
BASO%: 0.3 % (ref 0.0–2.0)
Basophils Absolute: 0 10*3/uL (ref 0.0–0.1)
EOS%: 2.4 % (ref 0.0–7.0)
Eosinophils Absolute: 0.1 10*3/uL (ref 0.0–0.5)
HCT: 35.1 % (ref 34.8–46.6)
HGB: 10.8 g/dL — ABNORMAL LOW (ref 11.6–15.9)
IMMATURE RETIC FRACT: 8.1 % (ref 1.60–10.00)
LYMPH%: 23 % (ref 14.0–49.7)
MCH: 28.4 pg (ref 25.1–34.0)
MCHC: 30.8 g/dL — AB (ref 31.5–36.0)
MCV: 92.4 fL (ref 79.5–101.0)
MONO#: 0.4 10*3/uL (ref 0.1–0.9)
MONO%: 10.7 % (ref 0.0–14.0)
NEUT#: 2.4 10*3/uL (ref 1.5–6.5)
NEUT%: 63.6 % (ref 38.4–76.8)
PLATELETS: 226 10*3/uL (ref 145–400)
RBC: 3.8 10*6/uL (ref 3.70–5.45)
RDW: 19.6 % — AB (ref 11.2–14.5)
RETIC %: 2.05 % (ref 0.70–2.10)
RETIC CT ABS: 77.9 10*3/uL (ref 33.70–90.70)
WBC: 3.8 10*3/uL — ABNORMAL LOW (ref 3.9–10.3)
lymph#: 0.9 10*3/uL (ref 0.9–3.3)

## 2014-04-16 LAB — FERRITIN CHCC: FERRITIN: 520 ng/mL — AB (ref 9–269)

## 2014-04-16 LAB — HOLD TUBE, BLOOD BANK

## 2014-04-16 NOTE — Progress Notes (Signed)
HGB is 10.8 this am. Notidied Dr. Alvy Bimler. No need for blood transfusion or Iron infusion today per Dr. Alvy Bimler.  Pt and her son notified of the above.  They have schedule to follow up with DR. Alvy Bimler and Dr. Olevia Perches. (GI)

## 2014-04-19 DIAGNOSIS — Z947 Corneal transplant status: Secondary | ICD-10-CM | POA: Diagnosis not present

## 2014-04-19 DIAGNOSIS — H1851 Endothelial corneal dystrophy: Secondary | ICD-10-CM | POA: Diagnosis not present

## 2014-04-19 DIAGNOSIS — H1810 Bullous keratopathy, unspecified eye: Secondary | ICD-10-CM | POA: Diagnosis not present

## 2014-04-23 ENCOUNTER — Telehealth: Payer: Self-pay | Admitting: *Deleted

## 2014-04-23 ENCOUNTER — Ambulatory Visit (HOSPITAL_BASED_OUTPATIENT_CLINIC_OR_DEPARTMENT_OTHER): Payer: Medicare Other | Admitting: Hematology and Oncology

## 2014-04-23 ENCOUNTER — Other Ambulatory Visit (HOSPITAL_BASED_OUTPATIENT_CLINIC_OR_DEPARTMENT_OTHER): Payer: Medicare Other

## 2014-04-23 ENCOUNTER — Telehealth: Payer: Self-pay | Admitting: Hematology and Oncology

## 2014-04-23 ENCOUNTER — Encounter: Payer: Self-pay | Admitting: Hematology and Oncology

## 2014-04-23 VITALS — BP 142/67 | HR 78 | Temp 97.8°F | Resp 18 | Ht 62.0 in | Wt 196.0 lb

## 2014-04-23 DIAGNOSIS — K31811 Angiodysplasia of stomach and duodenum with bleeding: Secondary | ICD-10-CM | POA: Diagnosis present

## 2014-04-23 DIAGNOSIS — I350 Nonrheumatic aortic (valve) stenosis: Secondary | ICD-10-CM

## 2014-04-23 DIAGNOSIS — D508 Other iron deficiency anemias: Secondary | ICD-10-CM

## 2014-04-23 DIAGNOSIS — K31819 Angiodysplasia of stomach and duodenum without bleeding: Secondary | ICD-10-CM

## 2014-04-23 DIAGNOSIS — D539 Nutritional anemia, unspecified: Secondary | ICD-10-CM

## 2014-04-23 LAB — CBC & DIFF AND RETIC
BASO%: 0.3 % (ref 0.0–2.0)
Basophils Absolute: 0 10*3/uL (ref 0.0–0.1)
EOS ABS: 0.1 10*3/uL (ref 0.0–0.5)
EOS%: 2.6 % (ref 0.0–7.0)
HCT: 32.4 % — ABNORMAL LOW (ref 34.8–46.6)
HGB: 10 g/dL — ABNORMAL LOW (ref 11.6–15.9)
IMMATURE RETIC FRACT: 11.4 % — AB (ref 1.60–10.00)
LYMPH%: 16.4 % (ref 14.0–49.7)
MCH: 29.1 pg (ref 25.1–34.0)
MCHC: 30.9 g/dL — AB (ref 31.5–36.0)
MCV: 94.2 fL (ref 79.5–101.0)
MONO#: 0.4 10*3/uL (ref 0.1–0.9)
MONO%: 10.5 % (ref 0.0–14.0)
NEUT%: 70.2 % (ref 38.4–76.8)
NEUTROS ABS: 2.7 10*3/uL (ref 1.5–6.5)
PLATELETS: 212 10*3/uL (ref 145–400)
RBC: 3.44 10*6/uL — AB (ref 3.70–5.45)
RDW: 20.9 % — AB (ref 11.2–14.5)
Retic %: 2.41 % — ABNORMAL HIGH (ref 0.70–2.10)
Retic Ct Abs: 82.9 10*3/uL (ref 33.70–90.70)
WBC: 3.9 10*3/uL (ref 3.9–10.3)
lymph#: 0.6 10*3/uL — ABNORMAL LOW (ref 0.9–3.3)

## 2014-04-23 LAB — FERRITIN CHCC: Ferritin: 337 ng/ml — ABNORMAL HIGH (ref 9–269)

## 2014-04-23 LAB — HOLD TUBE, BLOOD BANK

## 2014-04-23 NOTE — Telephone Encounter (Signed)
-----   Message from [name], MD sent at 04/23/2014  2:40 PM EDT ----- Regarding: ferritin level Pls tell her son ferritin has dropped to 337 but still OK

## 2014-04-23 NOTE — Telephone Encounter (Signed)
Informed son of Ferritin level 337.  He verbalized understanding.

## 2014-04-23 NOTE — Assessment & Plan Note (Signed)
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 She is on high-dose proton pump inhibitor for this. She has appointment to see Dr. Olevia Perches.

## 2014-04-23 NOTE — Telephone Encounter (Signed)
Gave avs & calendar for April. Patient already schedule.

## 2014-04-23 NOTE — Assessment & Plan Note (Signed)
The primary suspect is severe GI bleed from underlying GAVE. She will receive 2 units of blood transfusion whenever hemoglobin is less than 8 g. She has received numerous iron infusions. Most recently, her ferritin level dropped from over 500 to 337 today. There is no doubt in my mind that she continues to have active GI bleed. She will continue to come back here on a regular basis for blood check and intermittent iron infusion. Goal is to keep ferritin level greater than 100.

## 2014-04-23 NOTE — Progress Notes (Signed)
[facility] OFFICE PROGRESS NOTE  Sophia Pel, MD SUMMARY OF HEMATOLOGIC HISTORY: She was found to have abnormal CBC from recent blood count monitoring. The patient have chronic iron deficiency anemia due to recurrent GI bleed. 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 On 09/30/2013, bone marrow biopsy was done which showed no evidence to suggest myelodysplastic syndrome From July 2015 to March 2016, she has received numerous intravenous iron infusion INTERVAL HISTORY: Sophia [name] 81 y.o. female returns for physical follow-up. She feels well. Denies any chest pain or shortness of breath. The patient denies any recent signs or symptoms of bleeding such as spontaneous epistaxis, hematuria or hematochezia.  I have reviewed the past medical history, past surgical history, [name]; risedronate sodium; sertraline; fosamax ; lipitor; pregabalin; tolterodine tartrate; and wasp venom.  MEDICATIONS:  Current Outpatient Prescriptions  Medication Sig Dispense Refill  . ALPRAZolam (XANAX) 0.5 MG tablet Take 0.25 mg by mouth 2 (two) times daily as needed for anxiety.     . calcium citrate-vitamin D (CITRACAL+D) 315-200 MG-UNIT per tablet Take 3 tablets by mouth daily.     . Cholecalciferol (VITAMIN D3) 1000 UNITS CAPS Take by mouth. Take 1 tablet by mouth once daily     . denosumab (PROLIA) 60 MG/ML SOLN Inject 60 mg into the skin every 6 (six) months.     . dexlansoprazole (DEXILANT) 60 MG capsule Take 1 capsule (60 mg total) by mouth daily. 90 capsule 2  . EPINEPHrine (EPIPEN JR) 0.15 MG/0.3ML injection Inject 0.3 mg into the muscle as needed for anaphylaxis.    . fluorometholone (FML) 0.1 % ophthalmic suspension Place 1 drop into the left eye daily.     . folic acid (FOLVITE) 725 MCG tablet  Take 800 mcg by mouth daily.    . furosemide (LASIX) 40 MG tablet Take 40 mg by mouth every other day.     . gabapentin (NEURONTIN) 400 MG tablet Take 400 mg by mouth 2 (two) times daily.     [name] HYDROcodone-acetaminophen (NORCO/VICODIN) 5-325 MG per tablet Take 1 tablet by mouth every 6 (six) hours as needed for pain.    . hydroxypropyl methylcellulose / hypromellose (ISOPTO TEARS / GONIOVISC) 2.5 % ophthalmic solution Place 1-2 drops into both eyes daily. Instill 1 drop in the left eye daily and 2 drops in the right eye daily    . iron dextran complex (INFED) 50 MG/ML injection Iron infusion- Infed infustion over 4 hours of pharmacy calculated dose. No test dose needed. Weight-187 lbs Height - 5'.  Pt took 09-2011    . verapamil (VERELAN PM) 240 MG 24 hr capsule Take 1 tablet by mouth daily    . zolpidem (AMBIEN) 5 MG tablet Take 5 mg by mouth at bedtime as needed for sleep.     No current facility-administered medications for this visit.     REVIEW OF SYSTEMS:   Constitutional: Denies fevers, [name], nose, mouth, throat, [name], [name], [name], [name], [name], no new changes  All  other systems were reviewed with the patient and are negative.  PHYSICAL EXAMINATION: ECOG PERFORMANCE STATUS: 0 - Asymptomatic  Filed Vitals:   04/23/14 0909  BP: 142/67  Pulse: 78  Temp: 97.8 F (36.6 C)  Resp: 18   Filed Weights   04/23/14 0909  Weight: 196 lb (88.905 kg)    GENERAL:alert, [name], texture, turgor are normal, no  rashes or significant lesions EYES: normal, [name], [name], no focal motor/sensory deficits  LABORATORY DATA:  I have reviewed the data as listed Results for orders placed or performed in visit on 04/23/14 (from the past 48 hour(s))  CBC & Diff and Retic     Status: Abnormal   Collection Time: 04/23/14  8:52 AM  Result Value Ref Range   WBC 3.9 3.9 - 10.3 10e3/uL   NEUT# 2.7 1.5 - 6.5 10e3/uL   HGB 10.0 (L) 11.6 - 15.9 g/dL   HCT 32.4 (L) 34.8 - 46.6 %   Platelets 212 145 - 400 10e3/uL   MCV 94.2 79.5 - 101.0 fL   MCH 29.1 25.1 - 34.0 pg   MCHC 30.9 (L) 31.5 - 36.0 g/dL   RBC 3.44 (L) 3.70 - 5.45 10e6/uL   RDW 20.9 (H) 11.2 - 14.5 %   lymph# 0.6 (L) 0.9 - 3.3 10e3/uL   MONO# 0.4 0.1 - 0.9 10e3/uL   Eosinophils Absolute 0.1 0.0 - 0.5 10e3/uL   Basophils Absolute 0.0 0.0 - 0.1 10e3/uL   NEUT% 70.2 38.4 - 76.8 %   LYMPH% 16.4 14.0 - 49.7 %   MONO% 10.5 0.0 - 14.0 %   EOS% 2.6 0.0 - 7.0 %   BASO% 0.3 0.0 - 2.0 %   Retic % 2.41 (H) 0.70 - 2.10 %   Retic Ct Abs 82.90 33.70 - 90.70 10e3/uL   Immature Retic Fract 11.40 (H) 1.60 - 10.00 %  Hold Tube, Blood Bank     Status: None   Collection Time: 04/23/14  8:52 AM  Result Value Ref Range   Hold Tube, Blood Bank Blood Bank Order Cancelled   Ferritin     Status: Abnormal   Collection Time: 04/23/14  8:52 AM  Result Value Ref Range   Ferritin 337 (H) 9 - 269 ng/ml    Lab Results  Component Value Date   WBC 3.9 04/23/2014   HGB 10.0* 04/23/2014   HCT 32.4* 04/23/2014   MCV 94.2 04/23/2014   PLT 212 04/23/2014    ASSESSMENT & PLAN:  GAVE (gastric antral vascular ectasia) She had recurrent GI bleed from GAVE. She is on high-dose proton pump inhibitor for this. She has appointment to see Dr. Olevia Perches.     Other iron deficiency anemias The primary suspect is severe GI bleed from  underlying GAVE. She will receive 2 units of blood transfusion whenever hemoglobin is less than 8 g. She has received numerous iron infusions. Most recently, her ferritin level dropped from over 500 to 337 today. There is no doubt in my mind that she continues to have active GI bleed. She will continue to come back here on a regular basis for blood check and intermittent iron infusion. Goal is to keep ferritin level greater than 100.     Aortic stenosis There is associated between aortic stenosis and GI bleed. 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 Given her  age, surgery is recommended. I will continue iron infusion to support her through.    All questions were answered. The patient knows to call the clinic with any problems, questions or concerns. No barriers to learning was detected.  I spent 15 minutes counseling the patient face to face. The total time spent in the appointment was 20 minutes and more than 50% was on counseling.     [facility], Dwayne Bulkley, MD 3/18/20163:52 PM

## 2014-04-23 NOTE — Assessment & Plan Note (Signed)
There is associated between aortic stenosis and GI bleed. 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 Given her age, surgery is recommended. I will continue iron infusion to support her through.

## 2014-04-27 ENCOUNTER — Encounter: Payer: Self-pay | Admitting: Hematology and Oncology

## 2014-04-27 ENCOUNTER — Other Ambulatory Visit: Payer: Self-pay | Admitting: Internal Medicine

## 2014-04-28 ENCOUNTER — Telehealth: Payer: Self-pay | Admitting: Hematology and Oncology

## 2014-04-28 NOTE — Telephone Encounter (Signed)
Received routed message from triage and also a voicemail from patient son regarding 04/23/14 infusion appointment marked as a no show. Per the triage not and son. The appointment was not needed after patient was seen by NG and was not a no show. Status changed to left without being seen with a note that the appointment was not needed. Return's son's call but was not able to reach him. No one was identified on the voicemail and no message was left.

## 2014-04-29 ENCOUNTER — Other Ambulatory Visit: Payer: Self-pay | Admitting: Internal Medicine

## 2014-05-04 ENCOUNTER — Encounter: Payer: Self-pay | Admitting: Hematology and Oncology

## 2014-05-04 NOTE — Progress Notes (Signed)
[name] called and left message. I called him back. He said in [place] is showing a no show for labs 04/23/14. He said Dr. Alvy Bimler canceled the lab for his mom, so it is not a no show and wanted to make sure they were not charged. I advised him as of today, no charges--zero balance, but if they are billed to make sure he calls billing the moment the bill comes in.

## 2014-05-11 ENCOUNTER — Encounter: Payer: Self-pay | Admitting: Hematology and Oncology

## 2014-05-11 ENCOUNTER — Other Ambulatory Visit (HOSPITAL_BASED_OUTPATIENT_CLINIC_OR_DEPARTMENT_OTHER): Payer: Medicare Other

## 2014-05-11 ENCOUNTER — Telehealth: Payer: Self-pay | Admitting: Hematology and Oncology

## 2014-05-11 ENCOUNTER — Ambulatory Visit (HOSPITAL_BASED_OUTPATIENT_CLINIC_OR_DEPARTMENT_OTHER): Payer: Medicare Other | Admitting: Hematology and Oncology

## 2014-05-11 ENCOUNTER — Telehealth: Payer: Self-pay | Admitting: Internal Medicine

## 2014-05-11 ENCOUNTER — Other Ambulatory Visit: Payer: Self-pay | Admitting: *Deleted

## 2014-05-11 ENCOUNTER — Ambulatory Visit (HOSPITAL_BASED_OUTPATIENT_CLINIC_OR_DEPARTMENT_OTHER): Payer: Medicare Other

## 2014-05-11 VITALS — BP 110/55 | HR 76 | Temp 98.1°F | Resp 18 | Ht 62.0 in | Wt 193.2 lb

## 2014-05-11 DIAGNOSIS — K31819 Angiodysplasia of stomach and duodenum without bleeding: Secondary | ICD-10-CM

## 2014-05-11 DIAGNOSIS — K922 Gastrointestinal hemorrhage, unspecified: Secondary | ICD-10-CM | POA: Diagnosis not present

## 2014-05-11 DIAGNOSIS — D508 Other iron deficiency anemias: Secondary | ICD-10-CM

## 2014-05-11 DIAGNOSIS — D500 Iron deficiency anemia secondary to blood loss (chronic): Secondary | ICD-10-CM | POA: Diagnosis not present

## 2014-05-11 DIAGNOSIS — D539 Nutritional anemia, unspecified: Secondary | ICD-10-CM

## 2014-05-11 DIAGNOSIS — K31811 Angiodysplasia of stomach and duodenum with bleeding: Secondary | ICD-10-CM

## 2014-05-11 DIAGNOSIS — I350 Nonrheumatic aortic (valve) stenosis: Secondary | ICD-10-CM

## 2014-05-11 LAB — CBC & DIFF AND RETIC
BASO%: 0.3 % (ref 0.0–2.0)
BASOS ABS: 0 10*3/uL (ref 0.0–0.1)
EOS ABS: 0.1 10*3/uL (ref 0.0–0.5)
EOS%: 2.3 % (ref 0.0–7.0)
HCT: 30 % — ABNORMAL LOW (ref 34.8–46.6)
HEMOGLOBIN: 9.4 g/dL — AB (ref 11.6–15.9)
IMMATURE RETIC FRACT: 13.1 % — AB (ref 1.60–10.00)
LYMPH#: 0.7 10*3/uL — AB (ref 0.9–3.3)
LYMPH%: 17.8 % (ref 14.0–49.7)
MCH: 30.8 pg (ref 25.1–34.0)
MCHC: 31.3 g/dL — ABNORMAL LOW (ref 31.5–36.0)
MCV: 98.4 fL (ref 79.5–101.0)
MONO#: 0.4 10*3/uL (ref 0.1–0.9)
MONO%: 9.9 % (ref 0.0–14.0)
NEUT#: 2.7 10*3/uL (ref 1.5–6.5)
NEUT%: 69.7 % (ref 38.4–76.8)
Platelets: 241 10*3/uL (ref 145–400)
RBC: 3.05 10*6/uL — ABNORMAL LOW (ref 3.70–5.45)
RDW: 20.4 % — AB (ref 11.2–14.5)
RETIC CT ABS: 82.96 10*3/uL (ref 33.70–90.70)
Retic %: 2.72 % — ABNORMAL HIGH (ref 0.70–2.10)
WBC: 3.8 10*3/uL — ABNORMAL LOW (ref 3.9–10.3)

## 2014-05-11 LAB — FERRITIN CHCC: Ferritin: 127 ng/ml (ref 9–269)

## 2014-05-11 MED ORDER — SODIUM CHLORIDE 0.9 % IV SOLN
Freq: Once | INTRAVENOUS | Status: AC
  Administered 2014-05-11: 14:00:00 via INTRAVENOUS

## 2014-05-11 MED ORDER — SODIUM CHLORIDE 0.9 % IV SOLN
510.0000 mg | Freq: Once | INTRAVENOUS | Status: AC
  Administered 2014-05-11: 510 mg via INTRAVENOUS
  Filled 2014-05-11: qty 17

## 2014-05-11 NOTE — Telephone Encounter (Signed)
gave and printed appt sched and avs for pt for May....sed added tx.  °

## 2014-05-11 NOTE — Progress Notes (Signed)
[facility] OFFICE PROGRESS NOTE  Sophia Pel, MD SUMMARY OF HEMATOLOGIC HISTORY:  She was found to have abnormal CBC from recent blood count monitoring. The patient have chronic iron deficiency anemia due to recurrent GI bleed. 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 On 09/30/2013, bone marrow biopsy was done which showed no evidence to suggest myelodysplastic syndrome From July 2015 to March 2016, she has received numerous intravenous iron infusion INTERVAL HISTORY: Sophia [name] 81 y.o. female returns for  Further follow-up. She complained of fatigue. The patient denies any recent signs or symptoms of bleeding such as spontaneous epistaxis, hematuria or hematochezia.  I have reviewed the past medical history, past surgical history, [name]; risedronate sodium; sertraline; fosamax ; lipitor; pregabalin; tolterodine tartrate; and wasp venom.  MEDICATIONS:  Current Outpatient Prescriptions  Medication Sig Dispense Refill  . ALPRAZolam (XANAX) 0.5 MG tablet Take 0.25 mg by mouth 2 (two) times daily as needed for anxiety.     . calcium citrate-vitamin D (CITRACAL+D) 315-200 MG-UNIT per tablet Take 3 tablets by mouth daily.     . Cholecalciferol (VITAMIN D3) 1000 UNITS CAPS Take by mouth. Take 1 tablet by mouth once daily     . denosumab (PROLIA) 60 MG/ML SOLN Inject 60 mg into the skin every 6 (six) months.     . dexlansoprazole (DEXILANT) 60 MG capsule Take 1 capsule (60 mg total) by mouth daily. 90 capsule 2  . EPINEPHrine (EPIPEN JR) 0.15 MG/0.3ML injection Inject 0.3 mg into the muscle as needed for anaphylaxis.    . fluorometholone (FML) 0.1 % ophthalmic suspension Place 1 drop into the left eye daily.     . folic acid (FOLVITE) 379 MCG tablet Take 800 mcg by mouth daily.     . furosemide (LASIX) 40 MG tablet Take 40 mg by mouth every other day.     . gabapentin (NEURONTIN) 400 MG tablet Take 400 mg by mouth 2 (two) times daily.     [name] HYDROcodone-acetaminophen (NORCO/VICODIN) 5-325 MG per tablet Take 1 tablet by mouth every 6 (six) hours as needed for pain.    . hydroxypropyl methylcellulose / hypromellose (ISOPTO TEARS / GONIOVISC) 2.5 % ophthalmic solution Place 1-2 drops into both eyes daily. Instill 1 drop in the left eye daily and 2 drops in the right eye daily    . iron dextran complex (INFED) 50 MG/ML injection Iron infusion- Infed infustion over 4 hours of pharmacy calculated dose. No test dose needed. Weight-187 lbs Height - 5'.  Pt took 09-2011    . verapamil (VERELAN PM) 240 MG 24 hr capsule Take 1 tablet by mouth daily    . zolpidem (AMBIEN) 5 MG tablet Take 5 mg by mouth at bedtime as needed for sleep.     No current facility-administered medications for this visit.     REVIEW OF SYSTEMS:   Constitutional: Denies fevers, [name], nose, mouth, throat, [name], [name], [name], [name], [name], no new changes  All other systems were reviewed with  the patient and are negative.  PHYSICAL EXAMINATION: ECOG PERFORMANCE STATUS: 1 - Symptomatic but completely ambulatory  Filed Vitals:   05/11/14 1250  BP: 110/55  Pulse: 76  Temp: 98.1 F (36.7 C)  Resp: 18   Filed Weights   05/11/14 1250  Weight: 193 lb 3.2 oz (87.635 kg)    GENERAL:alert, [name] Is pale, texture, turgor are  normal, [name], Conjunctiva are pale and non-injected, sclera clear OROPHARYNX:no exudate, no erythema and lips, buccal mucosa, and tongue normal  NECK: supple, thyroid normal size, non-tender, without nodularity LYMPH:  no palpable lymphadenopathy in the cervical, axillary or inguinal LUNGS: clear to auscultation and percussion with normal breathing effort HEART: regular rate & rhythm  With systolic murmurs and no lower extremity edema ABDOMEN:abdomen soft, non-tender and normal bowel sounds Musculoskeletal:no cyanosis of digits and no clubbing  NEURO: alert & oriented x 3 with fluent speech, no focal motor/sensory deficits  LABORATORY DATA:  I have reviewed the data as listed Results for orders placed or performed in visit on 05/11/14 (from the past 48 hour(s))  CBC & Diff and Retic     Status: Abnormal   Collection Time: 05/11/14 12:57 PM  Result Value Ref Range   WBC 3.8 (L) 3.9 - 10.3 10e3/uL   NEUT# 2.7 1.5 - 6.5 10e3/uL   HGB 9.4 (L) 11.6 - 15.9 g/dL   HCT 30.0 (L) 34.8 - 46.6 %   Platelets 241 145 - 400 10e3/uL   MCV 98.4 79.5 - 101.0 fL   MCH 30.8 25.1 - 34.0 pg   MCHC 31.3 (L) 31.5 - 36.0 g/dL   RBC 3.05 (L) 3.70 - 5.45 10e6/uL   RDW 20.4 (H) 11.2 - 14.5 %   lymph# 0.7 (L) 0.9 - 3.3 10e3/uL   MONO# 0.4 0.1 - 0.9 10e3/uL   Eosinophils Absolute 0.1 0.0 - 0.5 10e3/uL   Basophils Absolute 0.0 0.0 - 0.1 10e3/uL   NEUT% 69.7 38.4 - 76.8 %   LYMPH% 17.8 14.0 - 49.7 %   MONO% 9.9 0.0 - 14.0 %   EOS% 2.3 0.0 - 7.0 %   BASO% 0.3 0.0 - 2.0 %   Retic % 2.72 (H) 0.70 - 2.10 %   Retic Ct Abs 82.96 33.70 - 90.70 10e3/uL   Immature Retic Fract 13.10 (H) 1.60 - 10.00 %    Lab Results  Component Value Date   WBC 3.8* 05/11/2014   HGB 9.4* 05/11/2014   HCT 30.0* 05/11/2014   MCV 98.4 05/11/2014   PLT 241 05/11/2014    ASSESSMENT & PLAN:  Other iron deficiency anemias The primary suspect is severe GI bleed from underlying GAVE. She will  receive 2 units of blood transfusion whenever hemoglobin is less than 8 g. She has received numerous iron infusions. Most recently, her ferritin level dropped from over 500 to 337, results from today pending. There is no doubt in my mind that she continues to have active GI bleed. She will continue to come back here on a regular basis for blood check and intermittent iron infusion. Goal is to keep ferritin level greater than 100.  I will proceed with iron infusion today. In the meantime, she will continue supplementation with folic acid and vitamin B 12 by mouth.   Aortic stenosis There is associated between aortic stenosis and GI bleed. 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 Given her age, surgery is probably not recommended. I will  continue iron infusion to support her through.   GAVE (gastric antral vascular ectasia) She had recurrent GI bleed from GAVE. She is on high-dose proton pump inhibitor for this.  I recommend she call for another appointment to see Dr. Olevia Perches.     All questions were answered. The patient knows to call the clinic with any problems, questions or concerns. No barriers to learning was detected.  I spent 15 minutes counseling the patient face to face. The total time spent in the appointment was 20 minutes and more than 50% was on counseling.     Sophia [name], Sophia [place], MD 4/5/20161:19 PM

## 2014-05-11 NOTE — Telephone Encounter (Signed)
Patient's son is calling to report iron and Hgb is down again. Dr. Alvy Bimler thought Dr. Olevia Perches might need to see her.  Please, advise.

## 2014-05-11 NOTE — Telephone Encounter (Signed)
Please add her on for next week.

## 2014-05-11 NOTE — Assessment & Plan Note (Signed)
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 She is on high-dose proton pump inhibitor for this.  I recommend she call for another appointment to see Dr. Olevia Perches.

## 2014-05-11 NOTE — Assessment & Plan Note (Signed)
There is associated between aortic stenosis and GI bleed. 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 Given her age, surgery is probably not recommended. I will continue iron infusion to support her through.

## 2014-05-11 NOTE — Patient Instructions (Signed)

## 2014-05-11 NOTE — Assessment & Plan Note (Signed)
The primary suspect is severe GI bleed from underlying GAVE. She will receive 2 units of blood transfusion whenever hemoglobin is less than 8 g. She has received numerous iron infusions. Most recently, her ferritin level dropped from over 500 to 337, results from today pending. There is no doubt in my mind that she continues to have active GI bleed. She will continue to come back here on a regular basis for blood check and intermittent iron infusion. Goal is to keep ferritin level greater than 100.  I will proceed with iron infusion today. In the meantime, she will continue supplementation with folic acid and vitamin B 12 by mouth.

## 2014-05-12 NOTE — Telephone Encounter (Signed)
Spoke with Dr. Olevia Perches and OK to [facility] for patient. Scheduled on 05/18/14 at 1:15 PM. Patient's soon notified of appointment.

## 2014-05-17 DIAGNOSIS — D500 Iron deficiency anemia secondary to blood loss (chronic): Secondary | ICD-10-CM | POA: Diagnosis not present

## 2014-05-17 DIAGNOSIS — M545 Low back pain: Secondary | ICD-10-CM | POA: Diagnosis not present

## 2014-05-18 ENCOUNTER — Ambulatory Visit (INDEPENDENT_AMBULATORY_CARE_PROVIDER_SITE_OTHER): Payer: Medicare Other | Admitting: Internal Medicine

## 2014-05-18 ENCOUNTER — Encounter: Payer: Self-pay | Admitting: Internal Medicine

## 2014-05-18 ENCOUNTER — Other Ambulatory Visit: Payer: Self-pay

## 2014-05-18 ENCOUNTER — Other Ambulatory Visit (INDEPENDENT_AMBULATORY_CARE_PROVIDER_SITE_OTHER): Payer: Medicare Other

## 2014-05-18 VITALS — BP 132/70 | HR 64 | Ht 62.0 in | Wt 192.8 lb

## 2014-05-18 DIAGNOSIS — K31819 Angiodysplasia of stomach and duodenum without bleeding: Secondary | ICD-10-CM | POA: Diagnosis not present

## 2014-05-18 DIAGNOSIS — D509 Iron deficiency anemia, unspecified: Secondary | ICD-10-CM

## 2014-05-18 DIAGNOSIS — M810 Age-related osteoporosis without current pathological fracture: Secondary | ICD-10-CM

## 2014-05-18 DIAGNOSIS — R195 Other fecal abnormalities: Secondary | ICD-10-CM | POA: Diagnosis not present

## 2014-05-18 DIAGNOSIS — Z79899 Other long term (current) drug therapy: Secondary | ICD-10-CM

## 2014-05-18 LAB — CBC WITH DIFFERENTIAL/PLATELET
BASOS ABS: 0 10*3/uL (ref 0.0–0.1)
Basophils Relative: 0.5 % (ref 0.0–3.0)
EOS PCT: 2.6 % (ref 0.0–5.0)
Eosinophils Absolute: 0.1 10*3/uL (ref 0.0–0.7)
HCT: 28.5 % — ABNORMAL LOW (ref 36.0–46.0)
HEMOGLOBIN: 9.6 g/dL — AB (ref 12.0–15.0)
LYMPHS ABS: 0.7 10*3/uL (ref 0.7–4.0)
Lymphocytes Relative: 15.9 % (ref 12.0–46.0)
MCHC: 33.5 g/dL (ref 30.0–36.0)
MCV: 95 fl (ref 78.0–100.0)
Monocytes Absolute: 0.5 10*3/uL (ref 0.1–1.0)
Monocytes Relative: 11.6 % (ref 3.0–12.0)
NEUTROS PCT: 69.4 % (ref 43.0–77.0)
Neutro Abs: 2.9 10*3/uL (ref 1.4–7.7)
Platelets: 276 10*3/uL (ref 150.0–400.0)
RBC: 3 Mil/uL — ABNORMAL LOW (ref 3.87–5.11)
RDW: 22 % — ABNORMAL HIGH (ref 11.5–15.5)
WBC: 4.2 10*3/uL (ref 4.0–10.5)

## 2014-05-18 LAB — IBC PANEL
IRON: 63 ug/dL (ref 42–145)
SATURATION RATIOS: 19.8 % — AB (ref 20.0–50.0)
TRANSFERRIN: 227 mg/dL (ref 212.0–360.0)

## 2014-05-18 LAB — FERRITIN: FERRITIN: 328.6 ng/mL — AB (ref 10.0–291.0)

## 2014-05-18 LAB — VITAMIN B12: VITAMIN B 12: 1231 pg/mL — AB (ref 211–911)

## 2014-05-18 LAB — FOLATE

## 2014-05-18 MED ORDER — NORETHINDRONE-ETH ESTRADIOL 1-5 MG-MCG PO TABS: 1.0000 | ORAL_TABLET | Freq: Every day | ORAL | Status: DC

## 2014-05-18 NOTE — Progress Notes (Signed)
Sophia [name] October 20, 1928 [id number]  Note: This dictation was prepared with Dragon digital system. Any transcriptional errors that result from this procedure are unintentional.   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 She underwent APC ablation of the gastric lesions on 04/12/2014. Prior ablation was in 2014. She denies any abdominal pain or visible blood per rectum. Small bowel capsule endoscopy in February 2012 showed gastritis as well as an excavated lesion with a dark pigment in the small bowel which was attributed to [name] at that time. She underwent iron infusion last week when hemoglobin was down to 9.4. Last colonoscopy 2012 showed  benign polyps. She denies any lower GI symptoms. She was transfused ferraheme 510 mg on 05/11/2014.She comes with her son.    Past Medical History  Diagnosis Date  . Congestive heart failure   . Aortic stenosis   . Hypertension   . Dyslipidemia   . Depression   . Duodenitis 03/15/10    per capsule endoscopy report  . Hemorrhagic gastritis 03/15/10    per capsule endoscopy report  . Diverticulosis 03/14/10  . Hyperplastic colon polyp   . Anemia   . DVT (deep venous thrombosis)     pt denies  . Anxiety   . Arthritis   . Blood transfusion   . Cataracts, bilateral     removed  . GERD (gastroesophageal reflux disease)   . Heart murmur   . Hyperlipidemia   . Hiatal hernia   . Osteoporosis   . Neuropathy     bil big toes  . Presbyesophagus   . Unspecified deficiency anemia 08/24/2013  . GAVE (gastric antral vascular ectasia)     Past Surgical History  Procedure Laterality Date  . Cholecystectomy    . Orif wrist fracture Right   . Fracture left knee  1993    x 2   . Foot surgery  1998  . Tubal ligation    . Plantar fasciitis repair      left  . Cataract extraction, bilateral    . Esophagogastroduodenoscopy N/A 04/28/2012    Procedure:  ESOPHAGOGASTRODUODENOSCOPY (EGD);  Surgeon: Lafayette Dragon, MD;  Location: [facility];  Service: Endoscopy;  Laterality: N/A;  . Hot hemostasis N/A 04/28/2012    Procedure: HOT HEMOSTASIS (ARGON PLASMA COAGULATION/BICAP);  Surgeon: Lafayette Dragon, MD;  Location: [facility];  Service: Endoscopy;  Laterality: N/A;  . Left eye cornea  surgery  yrs ago  . Esophagogastroduodenoscopy N/A 04/12/2014    Procedure: ESOPHAGOGASTRODUODENOSCOPY (EGD);  Surgeon: Lafayette Dragon, MD;  Location: [facility];  Service: Endoscopy;  Laterality: N/A;  . Hot hemostasis N/A 04/12/2014    Procedure: HOT HEMOSTASIS (ARGON PLASMA COAGULATION/BICAP);  Surgeon: Lafayette Dragon, MD;  Location: [facility];  Service: Endoscopy;  Laterality: N/A;    Allergies  Allergen Reactions  . Nutritional Supplements Anaphylaxis  . [name] Shortness Of Breath  . Sertraline Shortness Of Breath  . [name]  [Alendronate Sodium] Other (See Comments)    GI Upset  . Lipitor [Atorvastatin] Other (See Comments)    Muscle aches  . Pregabalin Nausea And Vomiting  . [name] Nausea And Vomiting  . Wasp Venom Hives    Family history and social history have been reviewed.  Review of Systems: Fatigue. Denies rectal bleeding diarrhea or abdominal pain  The remainder of the 10 point ROS is negative except as outlined in the H&P  Physical Exam: General Appearance  Well developed, in no distress, overweight Psychological Normal mood and affect  Assessment and Plan:   Patient is having ongoing GI blood loss from upper GI tract. Small bowel capsule endoscopy several years ago raised the question of small bowel lesion which was initially attributed to [name] but it may be a neoplasm.. We will repeat  small bowel capsule now that she has been off [name], to make sure she does not have a separate lesion in the small bowel. We will also start her on oral estrogens( birth control pills with 1.5 mg of estrogen daily and 0.5 mg  of progesterone) . We will be  checking  CBC and iron levels today. Depending on findings on capsule endoscopy we may need to go back and ablate more of the GAVE lesions    [name] 05/18/2014

## 2014-05-18 NOTE — Patient Instructions (Addendum)
Your physician has requested that you go to the basement for the following lab work before leaving today: CBC, Iron levels.   We have sent the following medications to your pharmacy for you to pick up at your convenience:northindrone-ethinyl  We have scheduled you for a small bowel capsule endoscopy and have given your separate instructions.   cc: [name], MD, Dr Alvy Bimler

## 2014-05-19 ENCOUNTER — Telehealth: Payer: Self-pay | Admitting: *Deleted

## 2014-05-19 NOTE — Telephone Encounter (Signed)
Prior Authorization needs to be done for norethindrone-ethinyl estradiol (FEMHRT 1/5) 1-5 mg-mcg tabs. Talked to [facility] to let them know that I will be working on that. They stated they understood.

## 2014-05-24 ENCOUNTER — Telehealth: Payer: Self-pay | Admitting: *Deleted

## 2014-05-24 MED ORDER — NORETHINDRONE-ETH ESTRADIOL 1-5 MG-MCG PO TABS: 1.0000 | ORAL_TABLET | Freq: Every day | ORAL | Status: DC

## 2014-05-24 NOTE — Telephone Encounter (Signed)
Prior authorization was approved through [facility] on 05/24/14 for norethindrone acetate/ethinyl estradiol. Approval is good from 02/23/2014 through 05/24/2015. Re-sent Rx for norethindrone acetate/ethinyl estrasdiol, #30 with one refill to [facility] off [address] in [place] on 05/24/14.

## 2014-05-25 ENCOUNTER — Ambulatory Visit (INDEPENDENT_AMBULATORY_CARE_PROVIDER_SITE_OTHER): Payer: Medicare Other | Admitting: Gastroenterology

## 2014-05-25 DIAGNOSIS — D509 Iron deficiency anemia, unspecified: Secondary | ICD-10-CM

## 2014-05-25 NOTE — Progress Notes (Signed)
Patient here for capsule endoscopy. Tolerated procedure. Verbalizes understanding of written and verbal instructions.She is accompanied by her son. He will remove the equipment at 4 pm and return it to the today. Patient walks with a walker and has difficulty coming out of the home. Capsule lot [id number] S exp.2017-06

## 2014-06-02 ENCOUNTER — Telehealth: Payer: Self-pay | Admitting: *Deleted

## 2014-06-02 ENCOUNTER — Encounter: Payer: Self-pay | Admitting: Gastroenterology

## 2014-06-02 DIAGNOSIS — D509 Iron deficiency anemia, unspecified: Secondary | ICD-10-CM

## 2014-06-02 NOTE — Telephone Encounter (Signed)
Please call pt with SBCE results, Possible bleeding lesion In the small bowl may be within the reach of a long enteroscope. Please check CBC And if Hgb < about 8.8, schedule for enteroscopy with APC ablation.     ----- Message -----     From: Rise Patience     Sent: 06/02/2014 12:33 PM      To: Lafayette Dragon, MD       Patient given results and recommendations. Lab in [facility]. Will call patient's son per her request.

## 2014-06-03 ENCOUNTER — Other Ambulatory Visit: Payer: Self-pay | Admitting: *Deleted

## 2014-06-03 ENCOUNTER — Other Ambulatory Visit (INDEPENDENT_AMBULATORY_CARE_PROVIDER_SITE_OTHER): Payer: Medicare Other

## 2014-06-03 DIAGNOSIS — D509 Iron deficiency anemia, unspecified: Secondary | ICD-10-CM | POA: Diagnosis not present

## 2014-06-03 LAB — CBC WITH DIFFERENTIAL/PLATELET
BASOS ABS: 0 10*3/uL (ref 0.0–0.1)
Basophils Relative: 0.2 % (ref 0.0–3.0)
EOS PCT: 2.4 % (ref 0.0–5.0)
Eosinophils Absolute: 0.1 10*3/uL (ref 0.0–0.7)
HCT: 28.9 % — ABNORMAL LOW (ref 36.0–46.0)
Hemoglobin: 9.7 g/dL — ABNORMAL LOW (ref 12.0–15.0)
LYMPHS PCT: 17.5 % (ref 12.0–46.0)
Lymphs Abs: 0.6 10*3/uL — ABNORMAL LOW (ref 0.7–4.0)
MCHC: 33.5 g/dL (ref 30.0–36.0)
MCV: 95.6 fl (ref 78.0–100.0)
MONOS PCT: 11.6 % (ref 3.0–12.0)
Monocytes Absolute: 0.4 10*3/uL (ref 0.1–1.0)
NEUTROS PCT: 68.3 % (ref 43.0–77.0)
Neutro Abs: 2.5 10*3/uL (ref 1.4–7.7)
PLATELETS: 245 10*3/uL (ref 150.0–400.0)
RBC: 3.03 Mil/uL — ABNORMAL LOW (ref 3.87–5.11)
RDW: 16.4 % — AB (ref 11.5–15.5)
WBC: 3.6 10*3/uL — AB (ref 4.0–10.5)

## 2014-06-03 NOTE — Telephone Encounter (Signed)
Spoke with patient's son [name] and gave him results and recommendations.

## 2014-06-04 ENCOUNTER — Other Ambulatory Visit: Payer: Self-pay | Admitting: *Deleted

## 2014-06-04 DIAGNOSIS — K31819 Angiodysplasia of stomach and duodenum without bleeding: Secondary | ICD-10-CM

## 2014-06-07 ENCOUNTER — Telehealth: Payer: Self-pay | Admitting: *Deleted

## 2014-06-07 ENCOUNTER — Encounter: Payer: Self-pay | Admitting: *Deleted

## 2014-06-07 NOTE — Telephone Encounter (Signed)
Notes Recorded by Hulan Saas, RN on 06/04/2014 at 9:42 AM Left a message for patient's son to call back for results. Notes Recorded by Hulan Saas, RN on 06/04/2014 at 9:41 AM Scheduled patient at [place] on 07/08/14 at 9:15 AM for enteroscopy with APC- Prop([facility])Lab in [facility]. Notes Recorded by Lafayette Dragon, MD on 06/03/2014 at 4:55 PM Please call pt with stable Hgb. SBCE showed a possible bleeding site in small bowl as well as the Known "watermellon stomach". I thing we should repeat the APC ablation of the known lesions as well as try to reach the small bowl area which may be within reach os a long scope. She may be scheduled for [facility] my hospital week, enteroscopy with APC ablation . Recheck Hgb in 4 weeks

## 2014-06-07 NOTE — Telephone Encounter (Signed)
Spoke with patient's son and gave him appointment date and time. Instructions mailed to patient.

## 2014-06-10 ENCOUNTER — Encounter: Payer: Self-pay | Admitting: Hematology and Oncology

## 2014-06-10 ENCOUNTER — Telehealth: Payer: Self-pay | Admitting: Hematology and Oncology

## 2014-06-10 ENCOUNTER — Ambulatory Visit (HOSPITAL_BASED_OUTPATIENT_CLINIC_OR_DEPARTMENT_OTHER): Payer: Medicare Other | Admitting: Hematology and Oncology

## 2014-06-10 ENCOUNTER — Other Ambulatory Visit (HOSPITAL_BASED_OUTPATIENT_CLINIC_OR_DEPARTMENT_OTHER): Payer: Medicare Other

## 2014-06-10 ENCOUNTER — Ambulatory Visit (HOSPITAL_BASED_OUTPATIENT_CLINIC_OR_DEPARTMENT_OTHER): Payer: Medicare Other

## 2014-06-10 VITALS — BP 129/58 | HR 79 | Temp 97.9°F | Resp 18 | Ht 62.0 in | Wt 200.7 lb

## 2014-06-10 DIAGNOSIS — K31819 Angiodysplasia of stomach and duodenum without bleeding: Secondary | ICD-10-CM

## 2014-06-10 DIAGNOSIS — D539 Nutritional anemia, unspecified: Secondary | ICD-10-CM

## 2014-06-10 DIAGNOSIS — K31811 Angiodysplasia of stomach and duodenum with bleeding: Secondary | ICD-10-CM

## 2014-06-10 DIAGNOSIS — D500 Iron deficiency anemia secondary to blood loss (chronic): Secondary | ICD-10-CM

## 2014-06-10 DIAGNOSIS — D508 Other iron deficiency anemias: Secondary | ICD-10-CM

## 2014-06-10 DIAGNOSIS — I350 Nonrheumatic aortic (valve) stenosis: Secondary | ICD-10-CM | POA: Diagnosis not present

## 2014-06-10 DIAGNOSIS — K922 Gastrointestinal hemorrhage, unspecified: Secondary | ICD-10-CM

## 2014-06-10 LAB — CBC & DIFF AND RETIC
BASO%: 0.2 % (ref 0.0–2.0)
BASOS ABS: 0 10*3/uL (ref 0.0–0.1)
EOS%: 3.2 % (ref 0.0–7.0)
Eosinophils Absolute: 0.2 10*3/uL (ref 0.0–0.5)
HCT: 30.1 % — ABNORMAL LOW (ref 34.8–46.6)
HEMOGLOBIN: 9.6 g/dL — AB (ref 11.6–15.9)
Immature Retic Fract: 12.8 % — ABNORMAL HIGH (ref 1.60–10.00)
LYMPH%: 17.6 % (ref 14.0–49.7)
MCH: 32.1 pg (ref 25.1–34.0)
MCHC: 31.9 g/dL (ref 31.5–36.0)
MCV: 100.7 fL (ref 79.5–101.0)
MONO#: 0.5 10*3/uL (ref 0.1–0.9)
MONO%: 11 % (ref 0.0–14.0)
NEUT%: 68 % (ref 38.4–76.8)
NEUTROS ABS: 3.2 10*3/uL (ref 1.5–6.5)
Platelets: 211 10*3/uL (ref 145–400)
RBC: 2.99 10*6/uL — AB (ref 3.70–5.45)
RDW: 14.9 % — AB (ref 11.2–14.5)
Retic %: 1.96 % (ref 0.70–2.10)
Retic Ct Abs: 58.6 10*3/uL (ref 33.70–90.70)
WBC: 4.7 10*3/uL (ref 3.9–10.3)
lymph#: 0.8 10*3/uL — ABNORMAL LOW (ref 0.9–3.3)

## 2014-06-10 LAB — HOLD TUBE, BLOOD BANK

## 2014-06-10 LAB — FERRITIN CHCC: Ferritin: 101 ng/ml (ref 9–269)

## 2014-06-10 MED ORDER — SODIUM CHLORIDE 0.9 % IV SOLN
Freq: Once | INTRAVENOUS | Status: AC
  Administered 2014-06-10: 12:00:00 via INTRAVENOUS

## 2014-06-10 MED ORDER — SODIUM CHLORIDE 0.9 % IV SOLN
510.0000 mg | Freq: Once | INTRAVENOUS | Status: AC
  Administered 2014-06-10: 510 mg via INTRAVENOUS
  Filled 2014-06-10: qty 17

## 2014-06-10 NOTE — Progress Notes (Signed)
[facility] OFFICE PROGRESS NOTE  Horatio Pel, MD SUMMARY OF HEMATOLOGIC HISTORY:  She was found to have abnormal CBC from recent blood count monitoring. The patient have chronic iron deficiency anemia due to recurrent GI bleed. 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 On 09/30/2013, bone marrow biopsy was done which showed no evidence to suggest myelodysplastic syndrome From July 2015 to May 2016, she has received numerous intravenous iron infusion INTERVAL HISTORY: Sophia [name] 81 y.o. female returns for further follow-up. She complained about fatigue. She was started on medications with hormone replacement in an attempt to slow down the bleeding. A planned GI procedure is arranged for next month. The patient denies any recent signs or symptoms of bleeding such as spontaneous epistaxis, hematuria or hematochezia.   I have reviewed the past medical history, past surgical history, [name]; risedronate sodium; sertraline; fosamax ; lipitor; pregabalin; tolterodine tartrate; and wasp venom.  MEDICATIONS:  Current Outpatient Prescriptions  Medication Sig Dispense Refill  . ALPRAZolam (XANAX) 0.5 MG tablet Take 0.25 mg by mouth 2 (two) times daily as needed for anxiety.     . calcium citrate-vitamin D (CITRACAL+D) 315-200 MG-UNIT per tablet Take 3 tablets by mouth daily.     . Cholecalciferol (VITAMIN D3) 1000 UNITS CAPS Take by mouth. Take 1 tablet by mouth once daily     . denosumab (PROLIA) 60 MG/ML SOLN Inject 60 mg into the skin every 6 (six) months.     . dexlansoprazole (DEXILANT) 60 MG capsule Take 1 capsule (60 mg total) by mouth daily. 90 capsule 2  . EPINEPHrine (EPIPEN JR) 0.15 MG/0.3ML injection Inject 0.3 mg into the muscle as needed for anaphylaxis.    .  fluorometholone (FML) 0.1 % ophthalmic suspension Place 1 drop into the left eye daily.     . folic acid (FOLVITE) 540 MCG tablet Take 800 mcg by mouth daily.    . furosemide (LASIX) 40 MG tablet Take 40 mg by mouth every other day.     . gabapentin (NEURONTIN) 400 MG tablet Take 400 mg by mouth 2 (two) times daily.     [name] HYDROcodone-acetaminophen (NORCO/VICODIN) 5-325 MG per tablet Take 1 tablet by mouth every 6 (six) hours as needed for pain.    . hydroxypropyl methylcellulose / hypromellose (ISOPTO TEARS / GONIOVISC) 2.5 % ophthalmic solution Place 1-2 drops into both eyes daily. Instill 1 drop in the left eye daily and 2 drops in the right eye daily    . iron dextran complex (INFED) 50 MG/ML injection Iron infusion- Infed infustion over 4 hours of pharmacy calculated dose. No test dose needed. Weight-187 lbs Height - 5'.  Pt took 09-2011    . norethindrone-ethinyl estradiol (FEMHRT 1/5) 1-5 MG-MCG TABS Take 1 tablet by mouth daily. 30 tablet 1  . verapamil (VERELAN PM) 240 MG 24 hr capsule Take 1 tablet by mouth daily    . zolpidem (AMBIEN) 5 MG tablet Take 5 mg by mouth at bedtime as needed for sleep.     No current facility-administered medications for this visit.     REVIEW OF SYSTEMS:   Constitutional: Denies fevers, [name], nose, mouth, throat, [name], [name], chest discomfort or lower extremity  swelling Gastrointestinal:  Denies nausea, [name], [name], no new changes  All other systems were reviewed with the patient and are negative.  PHYSICAL EXAMINATION: ECOG PERFORMANCE STATUS: 1 - Symptomatic but completely  ambulatory  Filed Vitals:   06/10/14 1002  BP: 129/58  Pulse: 79  Temp: 97.9 F (36.6 C)  Resp: 18   Filed Weights   06/10/14 1002  Weight: 200 lb 11.2 oz (91.037 kg)    GENERAL:alert, [name], texture, turgor are normal, [name], [name], sclera clear ABDOMEN:abdomen soft, non-tender and normal bowel sounds Musculoskeletal:no cyanosis of digits and no clubbing  NEURO: alert & oriented x 3 with fluent speech, no focal motor/sensory deficits  LABORATORY DATA:  I have reviewed the data as listed Results for orders placed or performed in visit on 06/10/14 (from the past 48 hour(s))  Ferritin     Status: None   Collection Time: 06/10/14  9:45 AM  Result Value Ref Range   Ferritin 101 9 - 269 ng/ml  CBC & Diff and Retic     Status: Abnormal   Collection Time: 06/10/14  9:45 AM  Result Value Ref Range   WBC 4.7 3.9 - 10.3 10e3/uL   NEUT# 3.2 1.5 - 6.5 10e3/uL   HGB 9.6 (L) 11.6 - 15.9 g/dL   HCT 30.1 (L) 34.8 - 46.6 %   Platelets 211 145 - 400 10e3/uL   MCV 100.7 79.5 - 101.0 fL   MCH 32.1 25.1 - 34.0 pg   MCHC 31.9 31.5 - 36.0 g/dL   RBC 2.99 (L) 3.70 - 5.45 10e6/uL   RDW 14.9 (H) 11.2 - 14.5 %   lymph# 0.8 (L) 0.9 - 3.3 10e3/uL   MONO# 0.5 0.1 - 0.9 10e3/uL   Eosinophils Absolute 0.2 0.0 - 0.5 10e3/uL   Basophils Absolute 0.0 0.0 - 0.1 10e3/uL   NEUT% 68.0 38.4 - 76.8 %   LYMPH% 17.6 14.0 - 49.7 %   MONO% 11.0 0.0 - 14.0 %   EOS% 3.2 0.0 - 7.0 %   BASO% 0.2 0.0 - 2.0 %   Retic % 1.96 0.70 - 2.10 %   Retic Ct Abs 58.60 33.70 - 90.70 10e3/uL   Immature Retic Fract 12.80 (H) 1.60 - 10.00 %  Hold Tube, Blood Bank     Status: None   Collection Time: 06/10/14  9:45 AM  Result Value Ref Range   Hold Tube, Blood Bank Blood Bank Order Cancelled     Lab Results  Component Value Date   WBC 4.7 06/10/2014   HGB 9.6* 06/10/2014   HCT 30.1* 06/10/2014   MCV 100.7  06/10/2014   PLT 211 06/10/2014   ASSESSMENT & PLAN:  Iron deficiency anemia due to chronic blood loss The primary suspect is severe GI bleed from underlying GAVE. She will receive 2 units of blood transfusion whenever hemoglobin is less than 8 g. She has received numerous iron infusions. Most recently, her ferritin level dropped from over 500 to 337, results from today showed further drop to 100. There is no doubt in my mind that she continues to have active GI bleed. She will continue to come back here on a regular basis for blood check and intermittent iron infusion. Goal is to keep giving her iron infusion  until she is no longer anemic or her GI bleed stops.  I will proceed with iron infusion today. In the meantime, she will continue supplementation with folic acid and vitamin B 12 by mouth.   Aortic stenosis There is associated between aortic stenosis and GI bleed. 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 Given her age, surgery is probably not recommended. I will continue iron infusion to support her through.   GAVE (gastric antral vascular ectasia) She had recurrent GI bleed from GAVE. She is on high-dose proton pump inhibitor for this. She has planned procedure next month. She was started on hormone replacement therapy in an attempt to slow down the GI bleed. I will defer to gastroenterologist for further management.      All questions were answered. The patient knows to call the clinic with any problems, questions or concerns. No barriers to learning was detected.  I spent 25 minutes counseling the patient face to face. The total time spent in the appointment was 30 minutes and more than 50% was on counseling.     Alvy Bimler, NI, MD 5/5/20163:17 PM

## 2014-06-10 NOTE — Patient Instructions (Signed)
Anemia, Nonspecific Anemia is a condition in which the concentration of red blood cells or hemoglobin in the blood is below normal. Hemoglobin is a substance in red blood cells that carries oxygen to the tissues of the body. Anemia results in not enough oxygen reaching these tissues.  CAUSES  Common causes of anemia include:   Excessive bleeding. Bleeding may be internal or external. This includes excessive bleeding from periods (in women) or from the intestine.   Poor nutrition.   Chronic kidney, thyroid, and liver disease.  Bone marrow disorders that decrease red blood cell production.  Cancer and treatments for cancer.  HIV, AIDS, and their treatments.  Spleen problems that increase red blood cell destruction.  Blood disorders.  Excess destruction of red blood cells due to infection, medicines, and autoimmune disorders. SIGNS AND SYMPTOMS   Minor weakness.   Dizziness.   Headache.  Palpitations.   Shortness of breath, especially with exercise.   Paleness.  Cold sensitivity.  Indigestion.  Nausea.  Difficulty sleeping.  Difficulty concentrating. Symptoms may occur suddenly or they may develop slowly.  DIAGNOSIS  Additional blood tests are often needed. These help your health care provider determine the best treatment. Your health care provider will check your stool for blood and look for other causes of blood loss.  TREATMENT  Treatment varies depending on the cause of the anemia. Treatment can include:   Supplements of iron, vitamin B12, or folic acid.   Hormone medicines.   A blood transfusion. This may be needed if blood loss is severe.   Hospitalization. This may be needed if there is significant continual blood loss.   Dietary changes.  Spleen removal. HOME CARE INSTRUCTIONS Keep all follow-up appointments. It often takes many weeks to correct anemia, and having your health care provider check on your condition and your response to  treatment is very important. SEEK IMMEDIATE MEDICAL CARE IF:   You develop extreme weakness, shortness of breath, or chest pain.   You become dizzy or have trouble concentrating.  You develop heavy vaginal bleeding.   You develop a rash.   You have bloody or black, tarry stools.   You faint.   You vomit up blood.   You vomit repeatedly.   You have abdominal pain.  You have a fever or persistent symptoms for more than 2-3 days.   You have a fever and your symptoms suddenly get worse.   You are dehydrated.  MAKE SURE YOU:  Understand these instructions.  Will watch your condition.  Will get help right away if you are not doing well or get worse. Document Released: 03/01/2004 Document Revised: 09/24/2012 Document Reviewed: 07/18/2012 Exit[employer] Patient Information 2015 [employer]. This information is not intended to replace advice given to you by your health care provider. Make sure you discuss any questions you have with your health care provider.  

## 2014-06-10 NOTE — Telephone Encounter (Signed)
Gave and printed appt sched and avs fo rpt for May and June>>>>sed added tx.

## 2014-06-10 NOTE — Assessment & Plan Note (Signed)
There is associated between aortic stenosis and GI bleed. 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 Given her age, surgery is probably not recommended. I will continue iron infusion to support her through.

## 2014-06-10 NOTE — Assessment & Plan Note (Signed)
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 She is on high-dose proton pump inhibitor for this. She has planned procedure next month. She was started on hormone replacement therapy in an attempt to slow down the GI bleed. I will defer to gastroenterologist for further management.

## 2014-06-10 NOTE — Assessment & Plan Note (Signed)
The primary suspect is severe GI bleed from underlying GAVE. She will receive 2 units of blood transfusion whenever hemoglobin is less than 8 g. She has received numerous iron infusions. Most recently, her ferritin level dropped from over 500 to 337, results from today showed further drop to 100. There is no doubt in my mind that she continues to have active GI bleed. She will continue to come back here on a regular basis for blood check and intermittent iron infusion. Goal is to keep giving her iron infusion until she is no longer anemic or her GI bleed stops.  I will proceed with iron infusion today. In the meantime, she will continue supplementation with folic acid and vitamin B 12 by mouth.

## 2014-06-11 DIAGNOSIS — H1820 Unspecified corneal edema: Secondary | ICD-10-CM | POA: Diagnosis not present

## 2014-06-11 DIAGNOSIS — H31001 Unspecified chorioretinal scars, right eye: Secondary | ICD-10-CM | POA: Diagnosis not present

## 2014-06-11 DIAGNOSIS — Z961 Presence of intraocular lens: Secondary | ICD-10-CM | POA: Diagnosis not present

## 2014-06-11 DIAGNOSIS — H3531 Nonexudative age-related macular degeneration: Secondary | ICD-10-CM | POA: Diagnosis not present

## 2014-06-21 ENCOUNTER — Telehealth: Payer: Self-pay | Admitting: *Deleted

## 2014-06-22 NOTE — Telephone Encounter (Signed)
Spoke with patient's son and gave him the new procedure time on 07/08/14 which is arrive at 12:00 PM and procedure is 1:30 PM. She can have clears until 9:30 AM.

## 2014-06-24 ENCOUNTER — Ambulatory Visit (HOSPITAL_BASED_OUTPATIENT_CLINIC_OR_DEPARTMENT_OTHER): Payer: Medicare Other

## 2014-06-24 ENCOUNTER — Other Ambulatory Visit (HOSPITAL_BASED_OUTPATIENT_CLINIC_OR_DEPARTMENT_OTHER): Payer: Medicare Other

## 2014-06-24 VITALS — BP 167/94 | HR 78 | Temp 97.9°F | Resp 18

## 2014-06-24 DIAGNOSIS — D500 Iron deficiency anemia secondary to blood loss (chronic): Secondary | ICD-10-CM

## 2014-06-24 DIAGNOSIS — K31819 Angiodysplasia of stomach and duodenum without bleeding: Secondary | ICD-10-CM

## 2014-06-24 DIAGNOSIS — D508 Other iron deficiency anemias: Secondary | ICD-10-CM

## 2014-06-24 DIAGNOSIS — K922 Gastrointestinal hemorrhage, unspecified: Secondary | ICD-10-CM

## 2014-06-24 DIAGNOSIS — D539 Nutritional anemia, unspecified: Secondary | ICD-10-CM

## 2014-06-24 LAB — CBC & DIFF AND RETIC
BASO%: 0.1 % (ref 0.0–2.0)
Basophils Absolute: 0 10*3/uL (ref 0.0–0.1)
EOS ABS: 0.1 10*3/uL (ref 0.0–0.5)
EOS%: 1.5 % (ref 0.0–7.0)
HCT: 31.5 % — ABNORMAL LOW (ref 34.8–46.6)
HGB: 10 g/dL — ABNORMAL LOW (ref 11.6–15.9)
Immature Retic Fract: 19.6 % — ABNORMAL HIGH (ref 1.60–10.00)
LYMPH%: 12.2 % — ABNORMAL LOW (ref 14.0–49.7)
MCH: 32.4 pg (ref 25.1–34.0)
MCHC: 31.7 g/dL (ref 31.5–36.0)
MCV: 101.9 fL — ABNORMAL HIGH (ref 79.5–101.0)
MONO#: 0.6 10*3/uL (ref 0.1–0.9)
MONO%: 8 % (ref 0.0–14.0)
NEUT%: 78.2 % — ABNORMAL HIGH (ref 38.4–76.8)
NEUTROS ABS: 6.1 10*3/uL (ref 1.5–6.5)
Platelets: 235 10*3/uL (ref 145–400)
RBC: 3.09 10*6/uL — ABNORMAL LOW (ref 3.70–5.45)
RDW: 14.6 % — AB (ref 11.2–14.5)
RETIC CT ABS: 90.23 10*3/uL (ref 33.70–90.70)
Retic %: 2.92 % — ABNORMAL HIGH (ref 0.70–2.10)
WBC: 7.8 10*3/uL (ref 3.9–10.3)
lymph#: 1 10*3/uL (ref 0.9–3.3)

## 2014-06-24 LAB — FERRITIN CHCC: FERRITIN: 262 ng/mL (ref 9–269)

## 2014-06-24 MED ORDER — SODIUM CHLORIDE 0.9 % IV SOLN
510.0000 mg | Freq: Once | INTRAVENOUS | Status: AC
  Administered 2014-06-24: 510 mg via INTRAVENOUS
  Filled 2014-06-24: qty 17

## 2014-06-24 NOTE — Patient Instructions (Signed)

## 2014-06-28 ENCOUNTER — Encounter (HOSPITAL_COMMUNITY): Payer: Self-pay | Admitting: *Deleted

## 2014-07-08 ENCOUNTER — Encounter (HOSPITAL_COMMUNITY): Payer: Self-pay | Admitting: Anesthesiology

## 2014-07-08 ENCOUNTER — Ambulatory Visit (HOSPITAL_COMMUNITY): Payer: Medicare Other | Admitting: Anesthesiology

## 2014-07-08 ENCOUNTER — Encounter (HOSPITAL_COMMUNITY)
Admission: RE | Disposition: A | Discharge status: Home / Self Care | Payer: Self-pay | Source: Ambulatory Visit | Attending: Internal Medicine

## 2014-07-08 ENCOUNTER — Ambulatory Visit (HOSPITAL_COMMUNITY)
Admission: RE | Admit: 2014-07-08 | Discharge: 2014-07-08 | Disposition: A | Discharge status: Home / Self Care | Payer: Medicare Other | Source: Ambulatory Visit | Attending: Internal Medicine | Admitting: Internal Medicine

## 2014-07-08 DIAGNOSIS — I10 Essential (primary) hypertension: Secondary | ICD-10-CM | POA: Diagnosis not present

## 2014-07-08 DIAGNOSIS — K922 Gastrointestinal hemorrhage, unspecified: Secondary | ICD-10-CM | POA: Diagnosis present

## 2014-07-08 DIAGNOSIS — K31819 Angiodysplasia of stomach and duodenum without bleeding: Secondary | ICD-10-CM | POA: Diagnosis not present

## 2014-07-08 DIAGNOSIS — Z79899 Other long term (current) drug therapy: Secondary | ICD-10-CM | POA: Diagnosis not present

## 2014-07-08 DIAGNOSIS — E785 Hyperlipidemia, unspecified: Secondary | ICD-10-CM | POA: Diagnosis not present

## 2014-07-08 DIAGNOSIS — D509 Iron deficiency anemia, unspecified: Secondary | ICD-10-CM | POA: Diagnosis not present

## 2014-07-08 HISTORY — PX: ENTEROSCOPY: SHX5533

## 2014-07-08 HISTORY — PX: HOT HEMOSTASIS: SHX5433

## 2014-07-08 SURGERY — EGD, WITH ARGON PLASMA COAGULATION
Anesthesia: Monitor Anesthesia Care

## 2014-07-08 MED ORDER — SODIUM CHLORIDE 0.9 % IV SOLN: INTRAVENOUS | Status: DC

## 2014-07-08 MED ORDER — LIDOCAINE HCL (CARDIAC) 20 MG/ML IV SOLN
INTRAVENOUS | Status: AC
  Filled 2014-07-08: qty 5

## 2014-07-08 MED ORDER — SODIUM CHLORIDE 0.9 % IV SOLN
INTRAVENOUS | Status: DC
Start: 2014-07-08 — End: 2014-07-08

## 2014-07-08 MED ORDER — PROPOFOL INFUSION 10 MG/ML OPTIME
INTRAVENOUS | Status: DC | PRN
  Administered 2014-07-08 (×4): 10 mL via INTRAVENOUS
  Administered 2014-07-08: 20 mL via INTRAVENOUS
  Administered 2014-07-08 (×4): 10 mL via INTRAVENOUS
  Administered 2014-07-08 (×2): 20 mL via INTRAVENOUS
  Administered 2014-07-08 (×2): 10 mL via INTRAVENOUS
  Administered 2014-07-08 (×3): 20 mL via INTRAVENOUS
  Administered 2014-07-08 (×3): 10 mL via INTRAVENOUS

## 2014-07-08 MED ORDER — ONDANSETRON HCL 4 MG/2ML IJ SOLN
INTRAMUSCULAR | Status: DC | PRN
  Administered 2014-07-08: 4 mg via INTRAVENOUS

## 2014-07-08 MED ORDER — PROPOFOL 10 MG/ML IV BOLUS
INTRAVENOUS | Status: AC
  Filled 2014-07-08: qty 20

## 2014-07-08 MED ORDER — LACTATED RINGERS IV SOLN
INTRAVENOUS | Status: DC
  Administered 2014-07-08: 125 mL/h via INTRAVENOUS

## 2014-07-08 MED ORDER — BUTAMBEN-TETRACAINE-BENZOCAINE 2-2-14 % EX AERO
INHALATION_SPRAY | CUTANEOUS | Status: DC | PRN
  Administered 2014-07-08: 1 via TOPICAL

## 2014-07-08 MED ORDER — ONDANSETRON HCL 4 MG/2ML IJ SOLN
INTRAMUSCULAR | Status: AC
  Filled 2014-07-08: qty 2

## 2014-07-08 NOTE — Transfer of Care (Signed)
Immediate Anesthesia Transfer of Care Note  Patient: Sophia [name]  Procedure(s) Performed: Procedure(s): HOT HEMOSTASIS (ARGON PLASMA COAGULATION/BICAP) (N/A) ENTEROSCOPY (N/A)  Patient Location: PACU and Endoscopy Unit  Anesthesia Type:MAC  Level of Consciousness: awake, alert , oriented and patient cooperative  Airway & Oxygen Therapy: Patient Spontanous Breathing and Patient connected to nasal cannula oxygen  Post-op Assessment: Report given to RN and Post -op Vital signs reviewed and stable  Post vital signs: Reviewed and stable  Last Vitals:  Filed Vitals:   07/08/14 1433  BP: 94/79  Pulse: 69  Temp:   Resp: 14    Complications: No apparent anesthesia complications

## 2014-07-08 NOTE — Anesthesia Preprocedure Evaluation (Signed)
Anesthesia Evaluation  Patient identified by MRN, date of birth, ID band Patient awake    Reviewed: Allergy & Precautions, NPO status , Patient's Chart, lab work & pertinent test results  Airway Mallampati: II  TM Distance: >3 FB Neck ROM: Full    Dental no notable dental hx. (+) Edentulous Upper, Edentulous Lower   Pulmonary neg pulmonary ROS, former smoker,  breath sounds clear to auscultation  Pulmonary exam normal       Cardiovascular hypertension, +CHF Normal cardiovascular exam+ Valvular Problems/Murmurs AS Rhythm:Regular Rate:Normal     Neuro/Psych negative neurological ROS  negative psych ROS   GI/Hepatic Neg liver ROS, hiatal hernia, GERD-  ,  Endo/Other  negative endocrine ROS  Renal/GU negative Renal ROS  negative genitourinary   Musculoskeletal negative musculoskeletal ROS (+)   Abdominal   Peds negative pediatric ROS (+)  Hematology negative hematology ROS (+)   Anesthesia Other Findings   Reproductive/Obstetrics negative OB ROS                             Anesthesia Physical  Anesthesia Plan  ASA: III  Anesthesia Plan: MAC   Post-op Pain Management:    Induction:   Airway Management Planned:   Additional Equipment:   Intra-op Plan:   Post-operative Plan:   Informed Consent: I have reviewed the patients History and Physical, chart, labs and discussed the procedure including the risks, benefits and alternatives for the proposed anesthesia with the patient or authorized representative who has indicated his/her understanding and acceptance.   Dental advisory given  Plan Discussed with: CRNA  Anesthesia Plan Comments:         Anesthesia Quick Evaluation

## 2014-07-08 NOTE — Discharge Instructions (Signed)
Esophagogastroduodenoscopy °Care After °Refer to this sheet in the next few weeks. These instructions provide you with information on caring for yourself after your procedure. Your caregiver may also give you more specific instructions. Your treatment has been planned according to current medical practices, but problems sometimes occur. Call your caregiver if you have any problems or questions after your procedure.  °HOME CARE INSTRUCTIONS °· Do not eat or drink anything until the numbing medicine (local anesthetic) has worn off and your gag reflex has returned. You will know that the local anesthetic has worn off when you can swallow comfortably. °· Do not drive for 12 hours after the procedure or as directed by your caregiver. °· Only take medicines as directed by your caregiver. °SEEK MEDICAL CARE IF:  °· You cannot stop coughing. °· You are not urinating at all or less than usual. °SEEK IMMEDIATE MEDICAL CARE IF: °· You have difficulty swallowing. °· You cannot eat or drink. °· You have worsening throat or chest pain. °· You have dizziness, lightheadedness, or you faint. °· You have nausea or vomiting. °· You have chills. °· You have a fever. °· You have severe abdominal pain. °· You have black, tarry, or bloody stools. °Document Released: 01/09/2012 Document Reviewed: 01/09/2012 °Exit[employer]® Patient Information ©2015 [employer]. This information is not intended to replace advice given to you by your health care provider. Make sure you discuss any questions you have with your health care provider. ° °

## 2014-07-08 NOTE — Op Note (Signed)
[facility] [place], [address]   ENDOSCOPY PROCEDURE REPORT  PATIENT: Sophia, [name]  MR#: [id number] BIRTHDATE: 1928-05-25 , 86  yrs. old GENDER: female ENDOSCOPIST: Lafayette Dragon, MD REFERRED BY:  Dr Alvy Bimler , Dr W.Pharr PROCEDURE DATE:  07/08/2014 PROCEDURE:      enteroscopy with APC ablation ASA CLASS:     Class III INDICATIONS:  chronic GI blood loss.  GAVE syndrome.  Chronic iron deficiency anemia.  Last APC ablation 04/12/2014.  Small bowel capsule endoscopy showed also gastric ectasias as well as question of inflammatory process 10 minutes beyond first duodenal image. MEDICATIONS: Monitored anesthesia care TOPICAL ANESTHETIC: none  DESCRIPTION OF PROCEDURE: After the risks benefits and alternatives of the procedure were thoroughly explained, informed consent was obtained.  The    endoscope was introduced through the mouth and advanced to the second portion of the duodenum , Without limitations.  The instrument was slowly withdrawn as the mucosa was fully examined.    Esophsgus:: Presbyesophagus.  Tortuous lumen.  No stricture.  Stomach:  normal gastric cardia and body of the stomach with extensive thumb vascular ectasia in the gastric antrum see photos. APC elation care area carried out with circumferential probe extensively throughout the prepyloric area. Setting 2 seconds 20 amps., Care was taken to suction all the excess all gas from the stomach  Duodenum: normal duodenal bulb and descending duodenum Jejunum:  examined to beyond ligament of Treitz 210 cm. 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  COMPLICATIONS: There were no immediate complications.  ENDOSCOPIC IMPRESSION: 1.GAVE syndrome 2. APC ablation of the gastric antral vascular lesions 3.no inflammatory process noted of  110 cm from the home oral cavity. 4. Jejunal diverticuli  RECOMMENDATIONS:  resume diet resume PPI Dr Alvy Bimler to continue H/H follow up  and Iron infusions. Consider re-treatment in 3 months if Hgb drops  REPEAT EXAM: ?? 3 months  eSigned:  Lafayette Dragon, MD 07/08/2014 2:52 PM    CC:  PATIENT NAME:  Sophia, [name] MR#: [id number]

## 2014-07-08 NOTE — H&P (View-Only) (Signed)
[facility] OFFICE PROGRESS NOTE  PHARR,Sophia DAVIDSON, MD SUMMARY OF HEMATOLOGIC HISTORY:  She was found to have abnormal CBC from recent blood count monitoring. The patient have chronic iron deficiency anemia due to recurrent GI bleed. 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 On 09/30/2013, bone marrow biopsy was done which showed no evidence to suggest myelodysplastic syndrome From July 2015 to May 2016, she has received numerous intravenous iron infusion INTERVAL HISTORY: Sophia [name] 81 y.o. female returns for further follow-up. She complained about fatigue. She was started on medications with hormone replacement in an attempt to slow down the bleeding. A planned GI procedure is arranged for next month. The patient denies any recent signs or symptoms of bleeding such as spontaneous epistaxis, hematuria or hematochezia.   I have reviewed the past medical history, past surgical history, [name]; risedronate sodium; sertraline; fosamax ; lipitor; pregabalin; tolterodine tartrate; and wasp venom.  MEDICATIONS:  Current Outpatient Prescriptions  Medication Sig Dispense Refill  . ALPRAZolam (XANAX) 0.5 MG tablet Take 0.25 mg by mouth 2 (two) times daily as needed for anxiety.     . calcium citrate-vitamin D (CITRACAL+D) 315-200 MG-UNIT per tablet Take 3 tablets by mouth daily.     . Cholecalciferol (VITAMIN D3) 1000 UNITS CAPS Take by mouth. Take 1 tablet by mouth once daily     . denosumab (PROLIA) 60 MG/ML SOLN Inject 60 mg into the skin every 6 (six) months.     . dexlansoprazole (DEXILANT) 60 MG capsule Take 1 capsule (60 mg total) by mouth daily. 90 capsule 2  . EPINEPHrine (EPIPEN JR) 0.15 MG/0.3ML injection Inject 0.3 mg into the muscle as needed for anaphylaxis.    .  fluorometholone (FML) 0.1 % ophthalmic suspension Place 1 drop into the left eye daily.     . folic acid (FOLVITE) 800 MCG tablet Take 800 mcg by mouth daily.    . furosemide (LASIX) 40 MG tablet Take 40 mg by mouth every other day.     . gabapentin (NEURONTIN) 400 MG tablet Take 400 mg by mouth 2 (two) times daily.     . HYDROcodone-acetaminophen (NORCO/VICODIN) 5-325 MG per tablet Take 1 tablet by mouth every 6 (six) hours as needed for pain.    . hydroxypropyl methylcellulose / hypromellose (ISOPTO TEARS / GONIOVISC) 2.5 % ophthalmic solution Place 1-2 drops into both eyes daily. Instill 1 drop in the left eye daily and 2 drops in the right eye daily    . iron dextran complex (INFED) 50 MG/ML injection Iron infusion- Infed infustion over 4 hours of pharmacy calculated dose. No test dose needed. Weight-187 lbs Height - 5'.  Pt took 09-2011    . norethindrone-ethinyl estradiol (FEMHRT 1/5) 1-5 MG-MCG TABS Take 1 tablet by mouth daily. 30 tablet 1  . verapamil (VERELAN PM) 240 MG 24 hr capsule Take 1 tablet by mouth daily    . zolpidem (AMBIEN) 5 MG tablet Take 5 mg by mouth at bedtime as needed for sleep.     No current facility-administered medications for this visit.     REVIEW OF SYSTEMS:   Constitutional: Denies fevers, [name], nose, mouth, throat, [name], [name], chest discomfort or lower extremity   swelling Gastrointestinal:  Denies nausea, [name], [name], no new changes  All other systems were reviewed with the patient and are negative.  PHYSICAL EXAMINATION: ECOG PERFORMANCE STATUS: 1 - Symptomatic but completely  ambulatory  Filed Vitals:   06/10/14 1002  BP: 129/58  Pulse: 79  Temp: 97.9 F (36.6 C)  Resp: 18   Filed Weights   06/10/14 1002  Weight: 200 lb 11.2 oz (91.037 kg)    GENERAL:alert, [name], texture, turgor are normal, [name], [name], sclera clear ABDOMEN:abdomen soft, non-tender and normal bowel sounds Musculoskeletal:no cyanosis of digits and no clubbing  NEURO: alert & oriented x 3 with fluent speech, no focal motor/sensory deficits  LABORATORY DATA:  I have reviewed the data as listed Results for orders placed or performed in visit on 06/10/14 (from the past 48 hour(s))  Ferritin     Status: None   Collection Time: 06/10/14  9:45 AM  Result Value Ref Range   Ferritin 101 9 - 269 ng/ml  CBC & Diff and Retic     Status: Abnormal   Collection Time: 06/10/14  9:45 AM  Result Value Ref Range   WBC 4.7 3.9 - 10.3 10e3/uL   NEUT# 3.2 1.5 - 6.5 10e3/uL   HGB 9.6 (L) 11.6 - 15.9 g/dL   HCT 30.1 (L) 34.8 - 46.6 %   Platelets 211 145 - 400 10e3/uL   MCV 100.7 79.5 - 101.0 fL   MCH 32.1 25.1 - 34.0 pg   MCHC 31.9 31.5 - 36.0 g/dL   RBC 2.99 (L) 3.70 - 5.45 10e6/uL   RDW 14.9 (H) 11.2 - 14.5 %   lymph# 0.8 (L) 0.9 - 3.3 10e3/uL   MONO# 0.5 0.1 - 0.9 10e3/uL   Eosinophils Absolute 0.2 0.0 - 0.5 10e3/uL   Basophils Absolute 0.0 0.0 - 0.1 10e3/uL   NEUT% 68.0 38.4 - 76.8 %   LYMPH% 17.6 14.0 - 49.7 %   MONO% 11.0 0.0 - 14.0 %   EOS% 3.2 0.0 - 7.0 %   BASO% 0.2 0.0 - 2.0 %   Retic % 1.96 0.70 - 2.10 %   Retic Ct Abs 58.60 33.70 - 90.70 10e3/uL   Immature Retic Fract 12.80 (H) 1.60 - 10.00 %  Hold Tube, Blood Bank     Status: None   Collection Time: 06/10/14  9:45 AM  Result Value Ref Range   Hold Tube, Blood Bank Blood Bank Order Cancelled     Lab Results  Component Value Date   WBC 4.7 06/10/2014   HGB 9.6* 06/10/2014   HCT 30.1* 06/10/2014   MCV 100.7  06/10/2014   PLT 211 06/10/2014   ASSESSMENT & PLAN:  Iron deficiency anemia due to chronic blood loss The primary suspect is severe GI bleed from underlying GAVE. She will receive 2 units of blood transfusion whenever hemoglobin is less than 8 g. She has received numerous iron infusions. Most recently, her ferritin level dropped from over 500 to 337, results from today showed further drop to 100. There is no doubt in my mind that she continues to have active GI bleed. She will continue to come back here on a regular basis for blood check and intermittent iron infusion. Goal is to keep giving her iron infusion  until she is no longer anemic or her GI bleed stops.  I will proceed with iron infusion today. In the meantime, she will continue supplementation with folic acid and vitamin B 12 by mouth.   Aortic stenosis There is associated between aortic stenosis and GI bleed. 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 Given her age, surgery is probably not recommended. I will continue iron infusion to support her through.   GAVE (gastric antral vascular ectasia) She had recurrent GI bleed from GAVE. She is on high-dose proton pump inhibitor for this. She has planned procedure next month. She was started on hormone replacement therapy in an attempt to slow down the GI bleed. I will defer to gastroenterologist for further management.      All questions were answered. The patient knows to call the clinic with any problems, questions or concerns. No barriers to learning was detected.  I spent 25 minutes counseling the patient face to face. The total time spent in the appointment was 30 minutes and more than 50% was on counseling.     Nishawn Rotan, MD 5/5/20163:17 PM     

## 2014-07-08 NOTE — Interval H&P Note (Signed)
History and Physical Interval Note:  07/08/2014 11:37 AM  Sophia [name]  has presented today for surgery, with the diagnosis of iron def anemia,GAVE  The various methods of treatment have been discussed with the patient and family. After consideration of risks, benefits and other options for treatment, the patient has consented to  Procedure(s): HOT HEMOSTASIS (ARGON PLASMA COAGULATION/BICAP) (N/A) ENTEROSCOPY (N/A) as a surgical intervention .  The patient's history has been reviewed, patient examined, no change in status, stable for surgery.  I have reviewed the patient's chart and labs.  Questions were answered to the patient's satisfaction.     [name]

## 2014-07-09 NOTE — Anesthesia Postprocedure Evaluation (Signed)
  Anesthesia Post-op Note  Patient: Sophia [name]  Procedure(s) Performed: Procedure(s) (LRB): HOT HEMOSTASIS (ARGON PLASMA COAGULATION/BICAP) (N/A) ENTEROSCOPY (N/A)  Patient Location: PACU  Anesthesia Type: MAC  Level of Consciousness: awake and alert   Airway and Oxygen Therapy: Patient Spontanous Breathing  Post-op Pain: mild  Post-op Assessment: Post-op Vital signs reviewed, Patient's Cardiovascular Status Stable, Respiratory Function Stable, Patent Airway and No signs of Nausea or vomiting  Last Vitals:  Filed Vitals:   07/08/14 1440  BP: 152/76  Pulse: 70  Temp:   Resp: 18    Post-op Vital Signs: stable   Complications: No apparent anesthesia complications

## 2014-07-12 ENCOUNTER — Encounter (HOSPITAL_COMMUNITY): Payer: Self-pay | Admitting: Internal Medicine

## 2014-07-22 ENCOUNTER — Other Ambulatory Visit (HOSPITAL_BASED_OUTPATIENT_CLINIC_OR_DEPARTMENT_OTHER): Payer: Medicare Other

## 2014-07-22 ENCOUNTER — Telehealth: Payer: Self-pay | Admitting: Hematology and Oncology

## 2014-07-22 ENCOUNTER — Encounter: Payer: Self-pay | Admitting: Hematology and Oncology

## 2014-07-22 ENCOUNTER — Telehealth: Payer: Self-pay | Admitting: *Deleted

## 2014-07-22 ENCOUNTER — Ambulatory Visit (HOSPITAL_BASED_OUTPATIENT_CLINIC_OR_DEPARTMENT_OTHER): Payer: Medicare Other

## 2014-07-22 ENCOUNTER — Ambulatory Visit (HOSPITAL_BASED_OUTPATIENT_CLINIC_OR_DEPARTMENT_OTHER): Payer: Medicare Other | Admitting: Hematology and Oncology

## 2014-07-22 VITALS — BP 109/47 | HR 73 | Temp 98.1°F | Resp 18 | Ht 62.0 in | Wt 199.9 lb

## 2014-07-22 VITALS — BP 118/48 | HR 95 | Temp 97.9°F | Resp 20

## 2014-07-22 DIAGNOSIS — D500 Iron deficiency anemia secondary to blood loss (chronic): Secondary | ICD-10-CM

## 2014-07-22 DIAGNOSIS — D539 Nutritional anemia, unspecified: Secondary | ICD-10-CM

## 2014-07-22 DIAGNOSIS — D508 Other iron deficiency anemias: Secondary | ICD-10-CM

## 2014-07-22 DIAGNOSIS — K31819 Angiodysplasia of stomach and duodenum without bleeding: Secondary | ICD-10-CM | POA: Diagnosis not present

## 2014-07-22 DIAGNOSIS — H259 Unspecified age-related cataract: Secondary | ICD-10-CM

## 2014-07-22 DIAGNOSIS — I350 Nonrheumatic aortic (valve) stenosis: Secondary | ICD-10-CM

## 2014-07-22 DIAGNOSIS — H25019 Cortical age-related cataract, unspecified eye: Secondary | ICD-10-CM

## 2014-07-22 LAB — CBC & DIFF AND RETIC
BASO%: 0.5 % (ref 0.0–2.0)
BASOS ABS: 0 10*3/uL (ref 0.0–0.1)
EOS%: 3.5 % (ref 0.0–7.0)
Eosinophils Absolute: 0.2 10*3/uL (ref 0.0–0.5)
HCT: 33.8 % — ABNORMAL LOW (ref 34.8–46.6)
HGB: 11.2 g/dL — ABNORMAL LOW (ref 11.6–15.9)
Immature Retic Fract: 10.4 % — ABNORMAL HIGH (ref 1.60–10.00)
LYMPH%: 18.1 % (ref 14.0–49.7)
MCH: 33 pg (ref 25.1–34.0)
MCHC: 33.1 g/dL (ref 31.5–36.0)
MCV: 99.7 fL (ref 79.5–101.0)
MONO#: 0.4 10*3/uL (ref 0.1–0.9)
MONO%: 9.5 % (ref 0.0–14.0)
NEUT%: 68.4 % (ref 38.4–76.8)
NEUTROS ABS: 3 10*3/uL (ref 1.5–6.5)
PLATELETS: 208 10*3/uL (ref 145–400)
RBC: 3.39 10*6/uL — AB (ref 3.70–5.45)
RDW: 12.7 % (ref 11.2–14.5)
RETIC CT ABS: 63.05 10*3/uL (ref 33.70–90.70)
Retic %: 1.86 % (ref 0.70–2.10)
WBC: 4.3 10*3/uL (ref 3.9–10.3)
lymph#: 0.8 10*3/uL — ABNORMAL LOW (ref 0.9–3.3)

## 2014-07-22 LAB — FERRITIN CHCC: Ferritin: 151 ng/ml (ref 9–269)

## 2014-07-22 MED ORDER — SODIUM CHLORIDE 0.9 % IV SOLN
510.0000 mg | Freq: Once | INTRAVENOUS | Status: AC
  Administered 2014-07-22: 510 mg via INTRAVENOUS
  Filled 2014-07-22: qty 17

## 2014-07-22 MED ORDER — SODIUM CHLORIDE 0.9 % IJ SOLN
10.0000 mL | INTRAMUSCULAR | Status: DC | PRN
  Filled 2014-07-22: qty 10

## 2014-07-22 NOTE — Assessment & Plan Note (Signed)
She had cataract with corneal implant in the past. She desire further surgery on the contralateral eye. There is no contraindication for her to proceed from hematology standpoint.

## 2014-07-22 NOTE — Telephone Encounter (Signed)
Per staff message and POF I have scheduled appts. Advised scheduler of appts. JMW  

## 2014-07-22 NOTE — Assessment & Plan Note (Signed)
There is associated between aortic stenosis and GI bleed. 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 Given her age, surgery is probably not recommended.  However, to give her the benefit of the doubt, I will recommend CT surgery consult to discuss risk and benefit. I will continue iron infusion to support her through.

## 2014-07-22 NOTE — Assessment & Plan Note (Signed)
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 She is on high-dose proton pump inhibitor for this. She had recent ablation surgery. We'll continue intermittent GI referral as needed.

## 2014-07-22 NOTE — Assessment & Plan Note (Signed)
The primary suspect is severe GI bleed from underlying GAVE. She will receive 2 units of blood transfusion whenever hemoglobin is less than 8 g. She has received numerous iron infusions. She will continue to come back here on a regular basis for blood check and intermittent iron infusion. Goal is to keep giving her iron infusion until she is no longer anemic or her GI bleed stops.  I will proceed with iron infusion today. In the meantime, she will continue supplementation with folic acid and vitamin B 12 by mouth. In addition to above, I will refer her back to GI intermittently whenever she have signs of active bleeding. Hopefully, with recent ablation, that would reduce her iron infusion requirement. In addition, I will also recommend CT surgery consult for possible repair of aortic stenosis.

## 2014-07-22 NOTE — Patient Instructions (Signed)

## 2014-07-22 NOTE — Telephone Encounter (Addendum)
Pt confirmed labs/ov per 06/16 POF, gave pt AVS and Calender......Cherylann Banas, sent msg to add chemo

## 2014-07-22 NOTE — Progress Notes (Signed)
[facility] OFFICE PROGRESS NOTE  Horatio Pel, MD SUMMARY OF HEMATOLOGIC HISTORY:  She was found to have abnormal CBC from recent blood count monitoring. The patient have chronic iron deficiency anemia due to recurrent GI bleed. 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 On 09/30/2013, bone marrow biopsy was done which showed no evidence to suggest myelodysplastic syndrome From July 2015 to May 2016, she has received numerous intravenous iron infusion On 07/08/2014, repeat endoscopy revealed multiple AVMs and GAVE and she have repeat ablation therapy INTERVAL HISTORY: Sophia [name] 81 y.o. female returns for further follow-up. She complain of fatigue. She also had some arthritis pain. The patient denies any recent signs or symptoms of bleeding such as spontaneous epistaxis, hematuria or hematochezia. She complained of blurriness of vision and desire further surgery for cataract in her eye.  I have reviewed the past medical history, past surgical history, [name]; risedronate sodium; sertraline; fosamax ; lipitor; pregabalin; tolterodine tartrate; and wasp venom.  MEDICATIONS:  Current Outpatient Prescriptions  Medication Sig Dispense Refill  . ALPRAZolam (XANAX) 0.5 MG tablet Take 0.25 mg by mouth 2 (two) times daily as needed for anxiety.     . calcium citrate-vitamin D (CITRACAL+D) 315-200 MG-UNIT per tablet Take 3 tablets by mouth daily.     . Cholecalciferol (VITAMIN D3) 1000 UNITS CAPS Take 1,000 Units by mouth daily. Take 1 tablet by mouth once daily    . denosumab (PROLIA) 60 MG/ML SOLN Inject 60 mg into the skin every 6 (six) months.     . dexlansoprazole (DEXILANT) 60 MG capsule Take 1 capsule (60 mg total) by mouth daily. 90 capsule 2  . EPINEPHrine (EPIPEN JR) 0.15  MG/0.3ML injection Inject 0.3 mg into the muscle as needed for anaphylaxis.    . ferumoxytol (FERAHEME) 510 MG/17ML SOLN injection Inject 510 mg into the vein See admin instructions. When iron is low. Last dose 06/10/14    . fluorometholone (FML) 0.1 % ophthalmic suspension Place 1 drop into the left eye daily.     . folic acid (FOLVITE) 903 MCG tablet Take 800 mcg by mouth daily.    . furosemide (LASIX) 40 MG tablet Take 40 mg by mouth every other day.     . gabapentin (NEURONTIN) 400 MG tablet Take 400 mg by mouth 2 (two) times daily.     [name] HYDROcodone-acetaminophen (NORCO/VICODIN) 5-325 MG per tablet Take 1 tablet by mouth every 6 (six) hours as needed for pain.    . hydroxypropyl methylcellulose / hypromellose (ISOPTO TEARS / GONIOVISC) 2.5 % ophthalmic solution Place 1-2 drops into both eyes daily. Instill 1 drop in the left eye daily and 2 drops in the right eye daily    . verapamil (VERELAN PM) 240 MG 24 hr capsule Take 1 tablet by mouth daily    . zolpidem (AMBIEN) 5 MG tablet Take 5 mg by mouth at bedtime as needed for sleep.     No current facility-administered medications for this visit.   Facility-Administered Medications Ordered in Other Visits  Medication Dose Route Frequency Provider Last Rate Last Dose  . sodium chloride 0.9 % injection 10 mL  10 mL Intracatheter PRN [name], MD         REVIEW OF SYSTEMS:   Constitutional: Denies fevers, chills or night sweats Ears, nose, mouth, throat, and face:  Denies mucositis or sore throat Respiratory: Denies cough, [name], [name], [name], [name], no new changes  All other systems were reviewed with the patient and are  negative.  PHYSICAL EXAMINATION: ECOG PERFORMANCE STATUS: 1 - Symptomatic but completely ambulatory  Filed Vitals:   07/22/14 0928  BP: 109/47  Pulse: 73  Temp: 98.1 F (36.7 C)  Resp: 18   Filed Weights   07/22/14 0928  Weight: 199 lb 14.4 oz (90.674 kg)    GENERAL:alert, [name], texture, turgor are normal, [name], [name], [name], no focal motor/sensory deficits  LABORATORY DATA:  I have reviewed the data as listed Results for orders placed or performed in visit on 07/22/14 (from the past 48 hour(s))  CBC & Diff and Retic     Status: Abnormal   Collection Time: 07/22/14  9:05 AM  Result Value Ref Range   WBC 4.3 3.9 - 10.3 10e3/uL   NEUT# 3.0 1.5 - 6.5 10e3/uL   HGB 11.2 (L) 11.6 - 15.9 g/dL   HCT 33.8 (L) 34.8 - 46.6 %   Platelets 208 145 - 400 10e3/uL   MCV 99.7 79.5 - 101.0 fL   MCH 33.0 25.1 - 34.0 pg   MCHC 33.1 31.5 - 36.0 g/dL   RBC 3.39 (L) 3.70 - 5.45 10e6/uL   RDW 12.7 11.2 - 14.5 %   lymph# 0.8 (L) 0.9 - 3.3 10e3/uL   MONO# 0.4 0.1 - 0.9 10e3/uL   Eosinophils Absolute 0.2 0.0 - 0.5 10e3/uL   Basophils Absolute 0.0 0.0 - 0.1 10e3/uL   NEUT% 68.4 38.4 - 76.8 %   LYMPH% 18.1 14.0 - 49.7 %   MONO% 9.5 0.0 - 14.0 %   EOS% 3.5 0.0 - 7.0 %   BASO% 0.5 0.0 - 2.0 %   Retic % 1.86 0.70 - 2.10 %   Retic Ct Abs 63.05 33.70 - 90.70 10e3/uL   Immature Retic Fract 10.40 (H) 1.60 - 10.00 %  Ferritin     Status: None   Collection Time: 07/22/14  9:05 AM  Result Value Ref Range   Ferritin 151 9 - 269 ng/ml    Lab Results  Component Value Date   WBC 4.3 07/22/2014   HGB 11.2* 07/22/2014   HCT 33.8* 07/22/2014   MCV 99.7 07/22/2014   PLT 208 07/22/2014   ASSESSMENT & PLAN:  Iron deficiency anemia due to chronic blood loss The primary suspect is  severe GI bleed from underlying GAVE. She will receive 2 units of blood transfusion whenever hemoglobin is less than 8 g. She has received numerous iron infusions. She will continue to come back here on a regular basis for blood check and intermittent iron infusion. Goal is to keep giving her iron infusion until she is no longer anemic or her GI bleed stops.  I will proceed with iron infusion today. In the meantime, she will continue supplementation with folic acid and vitamin B 12 by mouth. In addition to above, I will refer her back to GI intermittently whenever she have signs of active bleeding. Hopefully, with recent ablation,  that would reduce her iron infusion requirement. In addition, I will also recommend CT surgery consult for possible repair of aortic stenosis.  Aortic stenosis There is associated between aortic stenosis and GI bleed. 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 Given her age, surgery is probably not recommended.  However, to give her the benefit of the doubt, I will recommend CT surgery consult to discuss risk and benefit. I will continue iron infusion to support her through.    GAVE (gastric antral vascular ectasia) She had recurrent GI bleed from GAVE. She is on high-dose proton pump inhibitor for this. She had recent ablation surgery. We'll continue intermittent GI referral as needed.     Cataract cortical, senile She had cataract with corneal implant in the past. She desire further surgery on the contralateral eye. There is no contraindication for her to proceed from hematology standpoint.   All questions were answered. The patient knows to call the clinic with any problems, questions or concerns. No barriers to learning was detected.  I spent 25 minutes counseling the patient face to face. The total time spent in the appointment was 30 minutes and more than 50% was on counseling.     [facility], Ashaya Raftery, MD 6/16/201610:37 AM

## 2014-07-23 ENCOUNTER — Telehealth: Payer: Self-pay | Admitting: Hematology and Oncology

## 2014-07-23 ENCOUNTER — Telehealth: Payer: Self-pay | Admitting: *Deleted

## 2014-07-23 ENCOUNTER — Other Ambulatory Visit: Payer: Self-pay | Admitting: Hematology and Oncology

## 2014-07-23 DIAGNOSIS — I350 Nonrheumatic aortic (valve) stenosis: Secondary | ICD-10-CM

## 2014-07-23 NOTE — Telephone Encounter (Signed)
-----   Message from [name], MD sent at 07/23/2014  7:53 AM EDT ----- Regarding: cardiology referral CV surgery consultant requests cardiology assessment degree of aortic stenosis first. I placed order for consult. Please let her son know

## 2014-07-23 NOTE — Telephone Encounter (Signed)
Informed son of Dr. Calton Dach message below.  He states pt has been made appt w/ Dr. Mare Ferrari but since Dr. Tamala Julian has been pt's cardiologist son will call their office to get appt w/ Dr. Tamala Julian instead.

## 2014-07-23 NOTE — Telephone Encounter (Signed)
s.w. pt and advised on 6.24 cardiology appt at 2:30pm

## 2014-07-30 ENCOUNTER — Ambulatory Visit: Payer: Medicare Other | Admitting: Cardiology

## 2014-08-02 ENCOUNTER — Other Ambulatory Visit: Payer: Self-pay

## 2014-08-05 ENCOUNTER — Ambulatory Visit (HOSPITAL_BASED_OUTPATIENT_CLINIC_OR_DEPARTMENT_OTHER): Payer: Medicare Other

## 2014-08-05 ENCOUNTER — Other Ambulatory Visit (HOSPITAL_BASED_OUTPATIENT_CLINIC_OR_DEPARTMENT_OTHER): Payer: Medicare Other

## 2014-08-05 VITALS — BP 116/46 | HR 68 | Temp 98.1°F | Resp 18

## 2014-08-05 DIAGNOSIS — D500 Iron deficiency anemia secondary to blood loss (chronic): Secondary | ICD-10-CM

## 2014-08-05 DIAGNOSIS — D539 Nutritional anemia, unspecified: Secondary | ICD-10-CM

## 2014-08-05 DIAGNOSIS — D508 Other iron deficiency anemias: Secondary | ICD-10-CM

## 2014-08-05 DIAGNOSIS — K922 Gastrointestinal hemorrhage, unspecified: Secondary | ICD-10-CM | POA: Diagnosis not present

## 2014-08-05 DIAGNOSIS — K31819 Angiodysplasia of stomach and duodenum without bleeding: Secondary | ICD-10-CM

## 2014-08-05 LAB — CBC & DIFF AND RETIC
BASO%: 0.2 % (ref 0.0–2.0)
Basophils Absolute: 0 10*3/uL (ref 0.0–0.1)
EOS%: 2.9 % (ref 0.0–7.0)
Eosinophils Absolute: 0.1 10*3/uL (ref 0.0–0.5)
HCT: 32.2 % — ABNORMAL LOW (ref 34.8–46.6)
HGB: 10.6 g/dL — ABNORMAL LOW (ref 11.6–15.9)
IMMATURE RETIC FRACT: 14 % — AB (ref 1.60–10.00)
LYMPH%: 18 % (ref 14.0–49.7)
MCH: 32.7 pg (ref 25.1–34.0)
MCHC: 32.9 g/dL (ref 31.5–36.0)
MCV: 99.4 fL (ref 79.5–101.0)
MONO#: 0.6 10*3/uL (ref 0.1–0.9)
MONO%: 13.6 % (ref 0.0–14.0)
NEUT#: 2.7 10*3/uL (ref 1.5–6.5)
NEUT%: 65.3 % (ref 38.4–76.8)
PLATELETS: 194 10*3/uL (ref 145–400)
RBC: 3.24 10*6/uL — ABNORMAL LOW (ref 3.70–5.45)
RDW: 12.8 % (ref 11.2–14.5)
Retic %: 2.21 % — ABNORMAL HIGH (ref 0.70–2.10)
Retic Ct Abs: 71.6 10*3/uL (ref 33.70–90.70)
WBC: 4.1 10*3/uL (ref 3.9–10.3)
lymph#: 0.7 10*3/uL — ABNORMAL LOW (ref 0.9–3.3)

## 2014-08-05 LAB — FERRITIN CHCC: Ferritin: 339 ng/ml — ABNORMAL HIGH (ref 9–269)

## 2014-08-05 MED ORDER — SODIUM CHLORIDE 0.9 % IV SOLN
Freq: Once | INTRAVENOUS | Status: AC
  Administered 2014-08-05: 12:00:00 via INTRAVENOUS

## 2014-08-05 MED ORDER — SODIUM CHLORIDE 0.9 % IV SOLN
510.0000 mg | Freq: Once | INTRAVENOUS | Status: AC
  Administered 2014-08-05: 510 mg via INTRAVENOUS
  Filled 2014-08-05: qty 17

## 2014-08-05 NOTE — Patient Instructions (Signed)
Ferumoxytol injection (Feraheme) What is this medicine? FERUMOXYTOL is an iron complex. Iron is used to make healthy red blood cells, which carry oxygen and nutrients throughout the body. This medicine is used to treat iron deficiency anemia in people with chronic kidney disease. This medicine may be used for other purposes; ask your health care provider or pharmacist if you have questions. COMMON BRAND NAME(S): Feraheme What should I tell my health care provider before I take this medicine? They need to know if you have any of these conditions: -anemia not caused by low iron levels -high levels of iron in the blood -magnetic resonance imaging (MRI) test scheduled -an unusual or allergic reaction to iron, other medicines, foods, dyes, or preservatives -pregnant or trying to get pregnant -breast-feeding How should I use this medicine? This medicine is for injection into a vein. It is given by a health care professional in a hospital or clinic setting. Talk to your pediatrician regarding the use of this medicine in children. Special care may be needed. Overdosage: If you think you've taken too much of this medicine contact a poison control center or emergency room at once. Overdosage: If you think you have taken too much of this medicine contact a poison control center or emergency room at once. NOTE: This medicine is only for you. Do not share this medicine with others. What if I miss a dose? It is important not to miss your dose. Call your doctor or health care professional if you are unable to keep an appointment. What may interact with this medicine? This medicine may interact with the following medications: -other iron products This list may not describe all possible interactions. Give your health care provider a list of all the medicines, herbs, non-prescription drugs, or dietary supplements you use. Also tell them if you smoke, drink alcohol, or use illegal drugs. Some items may interact  with your medicine. What should I watch for while using this medicine? Visit your doctor or healthcare professional regularly. Tell your doctor or healthcare professional if your symptoms do not start to get better or if they get worse. You may need blood work done while you are taking this medicine. You may need to follow a special diet. Talk to your doctor. Foods that contain iron include: whole grains/cereals, dried fruits, beans, or peas, leafy green vegetables, and organ meats (liver, kidney). What side effects may I notice from receiving this medicine? Side effects that you should report to your doctor or health care professional as soon as possible: -allergic reactions like skin rash, itching or hives, swelling of the face, lips, or tongue -breathing problems -changes in blood pressure -feeling faint or lightheaded, falls -fever or chills -flushing, sweating, or hot feelings -swelling of the ankles or feet Side effects that usually do not require medical attention (Report these to your doctor or health care professional if they continue or are bothersome.): -diarrhea -headache -nausea, vomiting -stomach pain This list may not describe all possible side effects. Call your doctor for medical advice about side effects. You may report side effects to FDA at 1-800-FDA-1088. Where should I keep my medicine? This drug is given in a hospital or clinic and will not be stored at home. NOTE: This sheet is a summary. It may not cover all possible information. If you have questions about this medicine, talk to your doctor, pharmacist, or health care provider.  2015, [employer]. (2011-09-07 15:23:36)

## 2014-08-06 ENCOUNTER — Telehealth: Payer: Self-pay | Admitting: *Deleted

## 2014-08-06 ENCOUNTER — Ambulatory Visit (HOSPITAL_COMMUNITY)
Admission: RE | Admit: 2014-08-06 | Discharge: 2014-08-06 | Disposition: A | Discharge status: Home / Self Care | Payer: Medicare Other | Source: Ambulatory Visit | Attending: Hematology and Oncology | Admitting: Hematology and Oncology

## 2014-08-06 NOTE — Telephone Encounter (Signed)
Son called to ask about appt this morning that he got on Mychart.  Apologized for the inconvenience and informed him it is a monthly blood bank appt we make that blood bank needs to charge for their services.  It is not a real appt for pt.[name]  He verbalized understanding.

## 2014-08-16 DIAGNOSIS — M545 Low back pain: Secondary | ICD-10-CM | POA: Diagnosis not present

## 2014-08-16 DIAGNOSIS — G629 Polyneuropathy, unspecified: Secondary | ICD-10-CM | POA: Diagnosis not present

## 2014-08-16 DIAGNOSIS — D500 Iron deficiency anemia secondary to blood loss (chronic): Secondary | ICD-10-CM | POA: Diagnosis not present

## 2014-08-18 DIAGNOSIS — M25561 Pain in right knee: Secondary | ICD-10-CM | POA: Diagnosis not present

## 2014-08-18 DIAGNOSIS — M5136 Other intervertebral disc degeneration, lumbar region: Secondary | ICD-10-CM | POA: Diagnosis not present

## 2014-08-18 DIAGNOSIS — M150 Primary generalized (osteo)arthritis: Secondary | ICD-10-CM | POA: Diagnosis not present

## 2014-08-18 DIAGNOSIS — M810 Age-related osteoporosis without current pathological fracture: Secondary | ICD-10-CM | POA: Diagnosis not present

## 2014-08-19 ENCOUNTER — Telehealth: Payer: Self-pay | Admitting: *Deleted

## 2014-08-19 ENCOUNTER — Other Ambulatory Visit (HOSPITAL_BASED_OUTPATIENT_CLINIC_OR_DEPARTMENT_OTHER): Payer: Medicare Other

## 2014-08-19 ENCOUNTER — Ambulatory Visit: Payer: Medicare Other

## 2014-08-19 DIAGNOSIS — D500 Iron deficiency anemia secondary to blood loss (chronic): Secondary | ICD-10-CM | POA: Diagnosis present

## 2014-08-19 DIAGNOSIS — K31819 Angiodysplasia of stomach and duodenum without bleeding: Secondary | ICD-10-CM

## 2014-08-19 DIAGNOSIS — D508 Other iron deficiency anemias: Secondary | ICD-10-CM

## 2014-08-19 LAB — FERRITIN CHCC: Ferritin: 669 ng/ml — ABNORMAL HIGH (ref 9–269)

## 2014-08-19 LAB — CBC & DIFF AND RETIC
BASO%: 0 % (ref 0.0–2.0)
Basophils Absolute: 0 10*3/uL (ref 0.0–0.1)
EOS%: 0 % (ref 0.0–7.0)
Eosinophils Absolute: 0 10*3/uL (ref 0.0–0.5)
HCT: 33 % — ABNORMAL LOW (ref 34.8–46.6)
HGB: 11.2 g/dL — ABNORMAL LOW (ref 11.6–15.9)
Immature Retic Fract: 6.9 % (ref 1.60–10.00)
LYMPH%: 5.8 % — ABNORMAL LOW (ref 14.0–49.7)
MCH: 33 pg (ref 25.1–34.0)
MCHC: 33.9 g/dL (ref 31.5–36.0)
MCV: 97.3 fL (ref 79.5–101.0)
MONO#: 0.1 10*3/uL (ref 0.1–0.9)
MONO%: 1.4 % (ref 0.0–14.0)
NEUT#: 8.7 10*3/uL — ABNORMAL HIGH (ref 1.5–6.5)
NEUT%: 92.8 % — AB (ref 38.4–76.8)
Platelets: 253 10*3/uL (ref 145–400)
RBC: 3.39 10*6/uL — ABNORMAL LOW (ref 3.70–5.45)
RDW: 13 % (ref 11.2–14.5)
RETIC CT ABS: 82.38 10*3/uL (ref 33.70–90.70)
Retic %: 2.43 % — ABNORMAL HIGH (ref 0.70–2.10)
WBC: 9.4 10*3/uL (ref 3.9–10.3)
lymph#: 0.5 10*3/uL — ABNORMAL LOW (ref 0.9–3.3)
nRBC: 0 % (ref 0–0)

## 2014-08-19 NOTE — Telephone Encounter (Signed)
Pt's appt for blood and IV iron was canceled today due to no need for either transfusion or iron today per Dr. Alvy Bimler.   Instructed son to bring pt back in 2 weeks for lab/transfusion as scheduled.  He verbalized understanding.

## 2014-08-19 NOTE — Progress Notes (Signed)
Per Dr. Alvy Bimler pt treatment was cancelled today.

## 2014-08-27 ENCOUNTER — Encounter (HOSPITAL_COMMUNITY): Payer: Self-pay | Admitting: Emergency Medicine

## 2014-08-27 ENCOUNTER — Emergency Department (HOSPITAL_COMMUNITY)
Admission: EM | Admit: 2014-08-27 | Discharge: 2014-08-28 | Disposition: A | Discharge status: Home / Self Care | Payer: Medicare Other | Attending: Emergency Medicine | Admitting: Emergency Medicine

## 2014-08-27 ENCOUNTER — Emergency Department (HOSPITAL_COMMUNITY): Payer: Medicare Other

## 2014-08-27 DIAGNOSIS — S32010A Wedge compression fracture of first lumbar vertebra, initial encounter for closed fracture: Secondary | ICD-10-CM | POA: Diagnosis not present

## 2014-08-27 DIAGNOSIS — F419 Anxiety disorder, unspecified: Secondary | ICD-10-CM | POA: Insufficient documentation

## 2014-08-27 DIAGNOSIS — Y998 Other external cause status: Secondary | ICD-10-CM | POA: Diagnosis not present

## 2014-08-27 DIAGNOSIS — M545 Low back pain, unspecified: Secondary | ICD-10-CM

## 2014-08-27 DIAGNOSIS — M1990 Unspecified osteoarthritis, unspecified site: Secondary | ICD-10-CM | POA: Diagnosis not present

## 2014-08-27 DIAGNOSIS — Z9842 Cataract extraction status, left eye: Secondary | ICD-10-CM | POA: Insufficient documentation

## 2014-08-27 DIAGNOSIS — Y9389 Activity, other specified: Secondary | ICD-10-CM | POA: Insufficient documentation

## 2014-08-27 DIAGNOSIS — K219 Gastro-esophageal reflux disease without esophagitis: Secondary | ICD-10-CM | POA: Diagnosis not present

## 2014-08-27 DIAGNOSIS — R011 Cardiac murmur, unspecified: Secondary | ICD-10-CM | POA: Insufficient documentation

## 2014-08-27 DIAGNOSIS — I10 Essential (primary) hypertension: Secondary | ICD-10-CM | POA: Diagnosis not present

## 2014-08-27 DIAGNOSIS — I509 Heart failure, unspecified: Secondary | ICD-10-CM | POA: Diagnosis not present

## 2014-08-27 DIAGNOSIS — IMO0002 Reserved for concepts with insufficient information to code with codable children: Secondary | ICD-10-CM

## 2014-08-27 DIAGNOSIS — D6489 Other specified anemias: Secondary | ICD-10-CM | POA: Insufficient documentation

## 2014-08-27 DIAGNOSIS — M4850XA Collapsed vertebra, not elsewhere classified, site unspecified, initial encounter for fracture: Secondary | ICD-10-CM | POA: Diagnosis not present

## 2014-08-27 DIAGNOSIS — M5136 Other intervertebral disc degeneration, lumbar region: Secondary | ICD-10-CM | POA: Diagnosis not present

## 2014-08-27 DIAGNOSIS — Z86718 Personal history of other venous thrombosis and embolism: Secondary | ICD-10-CM | POA: Insufficient documentation

## 2014-08-27 DIAGNOSIS — M47816 Spondylosis without myelopathy or radiculopathy, lumbar region: Secondary | ICD-10-CM | POA: Diagnosis not present

## 2014-08-27 DIAGNOSIS — S79911A Unspecified injury of right hip, initial encounter: Secondary | ICD-10-CM | POA: Diagnosis not present

## 2014-08-27 DIAGNOSIS — X58XXXA Exposure to other specified factors, initial encounter: Secondary | ICD-10-CM | POA: Diagnosis not present

## 2014-08-27 DIAGNOSIS — M810 Age-related osteoporosis without current pathological fracture: Secondary | ICD-10-CM | POA: Diagnosis not present

## 2014-08-27 DIAGNOSIS — Z86010 Personal history of colonic polyps: Secondary | ICD-10-CM | POA: Insufficient documentation

## 2014-08-27 DIAGNOSIS — Z87891 Personal history of nicotine dependence: Secondary | ICD-10-CM | POA: Insufficient documentation

## 2014-08-27 DIAGNOSIS — Y9289 Other specified places as the place of occurrence of the external cause: Secondary | ICD-10-CM | POA: Insufficient documentation

## 2014-08-27 DIAGNOSIS — G629 Polyneuropathy, unspecified: Secondary | ICD-10-CM | POA: Diagnosis not present

## 2014-08-27 DIAGNOSIS — E785 Hyperlipidemia, unspecified: Secondary | ICD-10-CM | POA: Diagnosis not present

## 2014-08-27 DIAGNOSIS — Z79899 Other long term (current) drug therapy: Secondary | ICD-10-CM | POA: Insufficient documentation

## 2014-08-27 DIAGNOSIS — Z9841 Cataract extraction status, right eye: Secondary | ICD-10-CM | POA: Insufficient documentation

## 2014-08-27 DIAGNOSIS — D649 Anemia, unspecified: Secondary | ICD-10-CM | POA: Diagnosis not present

## 2014-08-27 DIAGNOSIS — F329 Major depressive disorder, single episode, unspecified: Secondary | ICD-10-CM | POA: Diagnosis not present

## 2014-08-27 DIAGNOSIS — R52 Pain, unspecified: Secondary | ICD-10-CM

## 2014-08-27 DIAGNOSIS — S3992XA Unspecified injury of lower back, initial encounter: Secondary | ICD-10-CM | POA: Diagnosis present

## 2014-08-27 DIAGNOSIS — M25551 Pain in right hip: Secondary | ICD-10-CM | POA: Diagnosis not present

## 2014-08-27 LAB — COMPREHENSIVE METABOLIC PANEL
ALK PHOS: 51 U/L (ref 38–126)
ALT: 13 U/L — ABNORMAL LOW (ref 14–54)
AST: 17 U/L (ref 15–41)
Albumin: 3.7 g/dL (ref 3.5–5.0)
Anion gap: 8 (ref 5–15)
BUN: 23 mg/dL — ABNORMAL HIGH (ref 6–20)
CO2: 25 mmol/L (ref 22–32)
CREATININE: 1.02 mg/dL — AB (ref 0.44–1.00)
Calcium: 9.1 mg/dL (ref 8.9–10.3)
Chloride: 101 mmol/L (ref 101–111)
GFR calc Af Amer: 57 mL/min — ABNORMAL LOW (ref >60)
GFR, EST NON AFRICAN AMERICAN: 49 mL/min — AB (ref >60)
GLUCOSE: 115 mg/dL — AB (ref 65–99)
POTASSIUM: 4.5 mmol/L (ref 3.5–5.1)
Sodium: 134 mmol/L — ABNORMAL LOW (ref 135–145)
Total Bilirubin: 0.2 mg/dL — ABNORMAL LOW (ref 0.3–1.2)
Total Protein: 6.3 g/dL — ABNORMAL LOW (ref 6.5–8.1)

## 2014-08-27 LAB — URINALYSIS, ROUTINE W REFLEX MICROSCOPIC
Bilirubin Urine: NEGATIVE
GLUCOSE, UA: NEGATIVE mg/dL
Hgb urine dipstick: NEGATIVE
Ketones, ur: NEGATIVE mg/dL
Leukocytes, UA: NEGATIVE
Nitrite: NEGATIVE
PH: 7.5 (ref 5.0–8.0)
PROTEIN: NEGATIVE mg/dL
SPECIFIC GRAVITY, URINE: 1.01 (ref 1.005–1.030)
Urobilinogen, UA: 0.2 mg/dL (ref 0.0–1.0)

## 2014-08-27 LAB — CBC
HCT: 32.3 % — ABNORMAL LOW (ref 36.0–46.0)
Hemoglobin: 10.9 g/dL — ABNORMAL LOW (ref 12.0–15.0)
MCH: 33.2 pg (ref 26.0–34.0)
MCHC: 33.7 g/dL (ref 30.0–36.0)
MCV: 98.5 fL (ref 78.0–100.0)
Platelets: 265 10*3/uL (ref 150–400)
RBC: 3.28 MIL/uL — AB (ref 3.87–5.11)
RDW: 13.2 % (ref 11.5–15.5)
WBC: 6.7 10*3/uL (ref 4.0–10.5)

## 2014-08-27 LAB — LIPASE, BLOOD: LIPASE: 27 U/L (ref 22–51)

## 2014-08-27 MED ORDER — MORPHINE SULFATE 2 MG/ML IJ SOLN
2.0000 mg | Freq: Once | INTRAMUSCULAR | Status: AC
  Administered 2014-08-27: 2 mg via INTRAVENOUS
  Filled 2014-08-27: qty 1

## 2014-08-27 MED ORDER — MORPHINE SULFATE 2 MG/ML IJ SOLN: 2.0000 mg | Freq: Once | INTRAMUSCULAR | Status: DC

## 2014-08-27 NOTE — ED Notes (Signed)
Delay in lab draw, pt in exray 

## 2014-08-27 NOTE — ED Provider Notes (Signed)
CSN: [id number]     Arrival date & time 08/27/14  1854 History   First MD Initiated Contact with Patient 08/27/14 2003     No chief complaint on file.    (Consider location/radiation/quality/duration/timing/severity/associated sxs/prior Treatment) HPI This is a 81 year old woman comes in today complaining of back pain on her right side. She states that since been present for over a week. She feels like she twisted and had some pain in her right low back area. She had some pain also in her knee and received an injection there. Today she feels like her stomach "can't get full". She states this is how she felt she had bleeding in her stomach. She has a history of gave syndrome. She has undergone endoscopic treatment. She is receiving IV iron and they report that her hemoglobin has been more stable over the past several weeks. She has not noted any rectal bleeding. She denies chest pain, headache, neck pain, lateralized weakness. Past Medical History  Diagnosis Date  . Congestive heart failure   . Aortic stenosis   . Hypertension   . Dyslipidemia   . Depression   . Duodenitis 03/15/10    per capsule endoscopy report  . Hemorrhagic gastritis 03/15/10    per capsule endoscopy report  . Diverticulosis 03/14/10  . Hyperplastic colon polyp   . DVT (deep venous thrombosis)     pt denies  . Anxiety   . Arthritis   . Blood transfusion   . Cataracts, bilateral     removed  . GERD (gastroesophageal reflux disease)   . Heart murmur   . Hyperlipidemia   . Hiatal hernia   . Osteoporosis   . Neuropathy     bil big toes  . Presbyesophagus   . GAVE (gastric antral vascular ectasia)   . Anemia   . Unspecified deficiency anemia 08/24/2013    Iron infusion    Past Surgical History  Procedure Laterality Date  . Cholecystectomy    . Orif wrist fracture Right   . Fracture left knee  1993    x 2   . Foot surgery  1998  . Tubal ligation    . Plantar fasciitis repair      left  . Cataract  extraction, bilateral    . Esophagogastroduodenoscopy N/A 04/28/2012    Procedure: ESOPHAGOGASTRODUODENOSCOPY (EGD);  Surgeon: Lafayette Dragon, MD;  Location: [facility];  Service: Endoscopy;  Laterality: N/A;  . Hot hemostasis N/A 04/28/2012    Procedure: HOT HEMOSTASIS (ARGON PLASMA COAGULATION/BICAP);  Surgeon: Lafayette Dragon, MD;  Location: [facility];  Service: Endoscopy;  Laterality: N/A;  . Left eye cornea  surgery  yrs ago  . Esophagogastroduodenoscopy N/A 04/12/2014    Procedure: ESOPHAGOGASTRODUODENOSCOPY (EGD);  Surgeon: Lafayette Dragon, MD;  Location: [facility];  Service: Endoscopy;  Laterality: N/A;  . Hot hemostasis N/A 04/12/2014    Procedure: HOT HEMOSTASIS (ARGON PLASMA COAGULATION/BICAP);  Surgeon: Lafayette Dragon, MD;  Location: [facility];  Service: Endoscopy;  Laterality: N/A;  . Hot hemostasis N/A 07/08/2014    Procedure: HOT HEMOSTASIS (ARGON PLASMA COAGULATION/BICAP);  Surgeon: Lafayette Dragon, MD;  Location: [facility];  Service: Endoscopy;  Laterality: N/A;  . Enteroscopy N/A 07/08/2014    Procedure: ENTEROSCOPY;  Surgeon: Lafayette Dragon, MD;  Location: [facility];  Service: Endoscopy;  Laterality: N/A;   Family History  Problem Relation Age of Onset  . Uterine cancer Sister     Spread to colon   . Prostate cancer Father   .  Kidney cancer Brother   . Kidney cancer Son    History  Substance Use Topics  . Smoking status: Former Smoker -- 0.50 packs/day for 20 years    Types: Cigarettes    Quit date: 01/08/1989  . Smokeless tobacco: Never Used  . Alcohol Use: No   OB History    No data available     Review of Systems  All other systems reviewed and are negative.     Allergies  Nutritional supplements; [name]; Sertraline; [name] ; Lipitor; Pregabalin; [name]; and Wasp venom  Home Medications   Prior to Admission medications   Medication Sig Start Date End Date Taking? Authorizing Provider  ALPRAZolam Duanne Moron) 0.5 MG tablet Take  0.25 mg by mouth 2 (two) times daily as needed for anxiety.    Yes Historical Provider, MD  calcium citrate-vitamin D (CITRACAL+D) 315-200 MG-UNIT per tablet Take 1 tablet by mouth daily.    Yes Historical Provider, MD  Cholecalciferol (VITAMIN D3) 1000 UNITS CAPS Take 1,000 Units by mouth daily. Take 1 tablet by mouth once daily   Yes Historical Provider, MD  denosumab (PROLIA) 60 MG/ML SOLN Inject 60 mg into the skin every 6 (six) months.    Yes Historical Provider, MD  dexlansoprazole (DEXILANT) 60 MG capsule Take 1 capsule (60 mg total) by mouth daily. 04/09/14  Yes Lafayette Dragon, MD  EPINEPHrine [facility]) 0.15 MG/0.3ML injection Inject 0.3 mg into the muscle as needed for anaphylaxis.   Yes Historical Provider, MD  ferumoxytol Shirlean Kelly) 510 MG/17ML SOLN injection Inject 510 mg into the vein See admin instructions. When iron is low. Last dose 06/10/14   Yes Historical Provider, MD  fluorometholone (FML) 0.1 % ophthalmic suspension Place 1 drop into the left eye daily.    Yes Historical Provider, MD  folic acid (FOLVITE) Q000111Q MCG tablet Take 800 mcg by mouth daily.   Yes Historical Provider, MD  furosemide (LASIX) 40 MG tablet Take 40 mg by mouth every other day.    Yes Historical Provider, MD  gabapentin (NEURONTIN) 400 MG tablet Take 400 mg by mouth 3 (three) times daily.    Yes Historical Provider, MD  HYDROcodone-acetaminophen (NORCO/VICODIN) 5-325 MG per tablet Take 1-2 tablets by mouth every 6 (six) hours as needed for pain.    Yes Historical Provider, MD  hydroxypropyl methylcellulose / hypromellose (ISOPTO TEARS / GONIOVISC) 2.5 % ophthalmic solution Place 1-2 drops into both eyes 3 (three) times daily as needed for dry eyes.    Yes Historical Provider, MD  verapamil (VERELAN PM) 240 MG 24 hr capsule Take 1 tablet by mouth daily   Yes Historical Provider, MD  zolpidem (AMBIEN) 5 MG tablet Take 5 mg by mouth at bedtime as needed for sleep.   Yes Historical Provider, MD   BP 161/92 mmHg   Pulse 82  Temp(Src) 97.7 F (36.5 C)  Resp 20  SpO2 95% Physical Exam  Constitutional: She is oriented to person, place, and time. She appears well-developed and well-nourished. No distress.  HENT:  Head: Normocephalic.  Eyes: Conjunctivae and EOM are normal. Pupils are equal, round, and reactive to light.  Neck: Normal range of motion.  Cardiovascular: Normal rate and regular rhythm.   Pulmonary/Chest: Effort normal and breath sounds normal.  Abdominal: Soft. Bowel sounds are normal.  Musculoskeletal: Normal range of motion.       Back:  Tenderness right posterior-superior left crest.  Neurological: She is alert and oriented to person, place, and time.  Skin:  Skin is warm and dry.  Psychiatric: She has a normal mood and affect. Her behavior is normal. Judgment and thought content normal.  Nursing note and vitals reviewed.   ED Course  Procedures (including critical care time) Labs Review Labs Reviewed  COMPREHENSIVE METABOLIC PANEL - Abnormal; Notable for the following:    Sodium 134 (*)    Glucose, Bld 115 (*)    BUN 23 (*)    Creatinine, Ser 1.02 (*)    Total Protein 6.3 (*)    ALT 13 (*)    Total Bilirubin 0.2 (*)    GFR calc non Af Amer 49 (*)    GFR calc Af Amer 57 (*)    All other components within normal limits  CBC - Abnormal; Notable for the following:    RBC 3.28 (*)    Hemoglobin 10.9 (*)    HCT 32.3 (*)    All other components within normal limits  URINALYSIS, ROUTINE W REFLEX MICROSCOPIC (NOT AT [facility])  LIPASE, BLOOD    Imaging Review Dg Hip Unilat  With Pelvis 2-3 Views Right  08/27/2014   CLINICAL DATA:  81 year old female with trauma and right hip pain  EXAM: DG HIP (WITH OR WITHOUT PELVIS) 2-3V RIGHT  COMPARISON:  None.  FINDINGS: There is no evidence of hip fracture or dislocation. There is no evidence of arthropathy or other focal bone abnormality.  Moderate amount of stool noted throughout the colon.  No acute fracture or dislocation. There is  degenerative changes of the lower lumbar spine.  IMPRESSION: No acute fracture or dislocation.   Electronically Signed   By: Anner Crete M.D.   On: 08/27/2014 19:29     EKG Interpretation None      MDM   Final diagnoses:  Acute low back pain  Anemia due to other cause  Compression fracture    81 year old female with back pain and feels like her stomach medically bleeding. 1 back pain x-rays obtained of the pelvis without any evidence of acute fracture. Lumbar x-rays reveal l1 compression fx c.w. Patient's pain. Advised of need for close follow up.  Patient treated here with pain medicine. She is taking hydrocodone at home. She cannot take on steroidal's and I would not put her on stairways given her history of bleeding. We will attempt to control her pain here with some additional pain medicine and she may continue her hydrocodone at home. 2 stomach complaints patient with soft nontender abdomen here. Her hemoglobin is stable from previous.    Pattricia Boss, MD 08/27/14 [phone]

## 2014-08-27 NOTE — ED Notes (Signed)
Pt to xray

## 2014-08-27 NOTE — ED Notes (Signed)
Pt complaining of severe back pain in her right side. States history of gastritis and bleeding in stomach. States over the last couple days states her stomach feels like it did when her HgB got down to 5. States 1 week prior she stepped up wrong and has been having right sided hip pain ever since, denies any fall.

## 2014-08-27 NOTE — Discharge Instructions (Signed)
Back, Compression Fracture °A compression fracture happens when a force is put upon the length of your spine. Slipping and falling on your bottom are examples of such a force. When this happens, sometimes the force is great enough to compress the building blocks (vertebral bodies) of your spine. Although this causes a lot of pain, this can usually be treated at home, unless your caregiver feels hospitalization is needed for pain control. °Your backbone (spinal column) is made up of 24 main vertebral bodies in addition to the sacrum and coccyx (see illustration). These are held together by tough fibrous tissues (ligaments) and by support of your muscles. Nerve roots pass through the openings between the vertebrae. A sudden wrenching move, injury, or a fall may cause a compression fracture of one of the vertebral bodies. This may result in back pain or spread of pain into the belly (abdomen), the buttocks, and down the leg into the foot. Pain may also be created by muscle spasm alone. °Large studies have been undertaken to determine the best possible course of action to help your back following injury and also to prevent future problems. The recommendations are as follows. °FOLLOWING A COMPRESSION FRACTURE: °Do the following only if advised by your caregiver.  °· If a back brace has been suggested or provided, wear it as directed. °· Do not stop wearing the back brace unless instructed by your caregiver. °· When allowed to return to regular activities, avoid a sedentary lifestyle. Actively exercise. Sporadic weekend binges of tennis, racquetball, or waterskiing may actually aggravate or create problems, especially if you are not in condition for that activity. °· Avoid sports requiring sudden body movements until you are in condition for them. Swimming and walking are safer activities. °· Maintain good posture. °· Avoid obesity. °· If not already done, you should have a DEXA scan. Based on the results, be treated for  osteoporosis. °FOLLOWING ACUTE (SUDDEN) INJURY: °· Only take over-the-counter or prescription medicines for pain, discomfort, or fever as directed by your caregiver. °· Use bed rest for only the most extreme acute episode. Prolonged bed rest may aggravate your condition. Ice used for acute conditions is effective. Use a large plastic bag filled with ice. Wrap it in a towel. This also provides excellent pain relief. This may be continuous. Or use it for 30 minutes every 2 hours during acute phase, then as needed. Heat for 30 minutes prior to activities is helpful. °· As soon as the acute phase (the time when your back is too painful for you to do normal activities) is over, it is important to resume normal activities and work hardening programs. Back injuries can cause potentially marked changes in lifestyle. So it is important to attack these problems aggressively. °· See your caregiver for continued problems. He or she can help or refer you for appropriate exercises, physical therapy, and work hardening if needed. °· If you are given narcotic medications for your condition, for the next 24 hours do not: °¨ Drive. °¨ Operate machinery or power tools. °¨ Sign legal documents. °· Do not drink alcohol, or take sleeping pills or other medications that may interfere with treatment. °If your caregiver has given you a follow-up appointment, it is very important to keep that appointment. Not keeping the appointment could result in a chronic or permanent injury, pain, and disability. If there is any problem keeping the appointment, you must call back to this facility for assistance.  °SEEK IMMEDIATE MEDICAL CARE IF: °· You develop numbness,   tingling, weakness, or problems with the use of your arms or legs. °· You develop severe back pain not relieved with medications. °· You have changes in bowel or bladder control. °· You have increasing pain in any areas of the body. °Document Released: 01/22/2005 Document Revised:  06/08/2013 Document Reviewed: 08/27/2007 °ExitCare® Patient Information ©2015 [employer]. This information is not intended to replace advice given to you by your health care provider. Make sure you discuss any questions you have with your health care provider. ° °

## 2014-08-27 NOTE — ED Notes (Addendum)
Pt states her "back or hip" is hurting because she "stepped wrong". Pt also states she "cant get full" she denies pain in her stomach. Back pain x 1 week. Stomach discomfort x 1 day.

## 2014-08-31 ENCOUNTER — Telehealth: Payer: Self-pay | Admitting: *Deleted

## 2014-08-31 NOTE — Telephone Encounter (Signed)
Family called to cancel appts for Thursday due to patient is "down in her back". Went to ED on 7/22- Hgb was 10.9. Wants to know when Dr Alvy Bimler wants her to come in. Next scheduled appt is 8/11.

## 2014-08-31 NOTE — Telephone Encounter (Signed)
It's OK Please cancel and keep the one on 8/11

## 2014-08-31 NOTE — Telephone Encounter (Signed)
Notified OK to cancel Thursday's appts, Keep 8/11 as scheduled

## 2014-09-02 ENCOUNTER — Other Ambulatory Visit: Payer: Medicare Other

## 2014-09-06 ENCOUNTER — Ambulatory Visit (HOSPITAL_COMMUNITY)
Admission: RE | Admit: 2014-09-06 | Discharge: 2014-09-06 | Disposition: A | Discharge status: Home / Self Care | Payer: Medicare Other | Source: Ambulatory Visit | Attending: Hematology and Oncology | Admitting: Hematology and Oncology

## 2014-09-16 ENCOUNTER — Other Ambulatory Visit (HOSPITAL_BASED_OUTPATIENT_CLINIC_OR_DEPARTMENT_OTHER): Payer: Medicare Other

## 2014-09-16 ENCOUNTER — Ambulatory Visit (HOSPITAL_BASED_OUTPATIENT_CLINIC_OR_DEPARTMENT_OTHER): Payer: Medicare Other

## 2014-09-16 ENCOUNTER — Telehealth: Payer: Self-pay | Admitting: *Deleted

## 2014-09-16 ENCOUNTER — Other Ambulatory Visit: Payer: Self-pay | Admitting: Hematology and Oncology

## 2014-09-16 VITALS — BP 136/65 | HR 77 | Temp 98.6°F | Resp 20

## 2014-09-16 DIAGNOSIS — D508 Other iron deficiency anemias: Secondary | ICD-10-CM

## 2014-09-16 DIAGNOSIS — D539 Nutritional anemia, unspecified: Secondary | ICD-10-CM

## 2014-09-16 DIAGNOSIS — K31819 Angiodysplasia of stomach and duodenum without bleeding: Secondary | ICD-10-CM

## 2014-09-16 LAB — FERRITIN CHCC: Ferritin: 153 ng/ml (ref 9–269)

## 2014-09-16 LAB — CBC & DIFF AND RETIC
BASO%: 0.4 % (ref 0.0–2.0)
Basophils Absolute: 0 10*3/uL (ref 0.0–0.1)
EOS%: 1.8 % (ref 0.0–7.0)
Eosinophils Absolute: 0.1 10*3/uL (ref 0.0–0.5)
HCT: 34.4 % — ABNORMAL LOW (ref 34.8–46.6)
HEMOGLOBIN: 11.5 g/dL — AB (ref 11.6–15.9)
IMMATURE RETIC FRACT: 11.1 % — AB (ref 1.60–10.00)
LYMPH%: 15.7 % (ref 14.0–49.7)
MCH: 33 pg (ref 25.1–34.0)
MCHC: 33.4 g/dL (ref 31.5–36.0)
MCV: 98.9 fL (ref 79.5–101.0)
MONO#: 0.5 10*3/uL (ref 0.1–0.9)
MONO%: 11.5 % (ref 0.0–14.0)
NEUT#: 3.1 10*3/uL (ref 1.5–6.5)
NEUT%: 70.6 % (ref 38.4–76.8)
Platelets: 260 10*3/uL (ref 145–400)
RBC: 3.48 10*6/uL — ABNORMAL LOW (ref 3.70–5.45)
RDW: 13.3 % (ref 11.2–14.5)
RETIC CT ABS: 71.34 10*3/uL (ref 33.70–90.70)
Retic %: 2.05 % (ref 0.70–2.10)
WBC: 4.5 10*3/uL (ref 3.9–10.3)
lymph#: 0.7 10*3/uL — ABNORMAL LOW (ref 0.9–3.3)

## 2014-09-16 MED ORDER — FERUMOXYTOL INJECTION 510 MG/17 ML
510.0000 mg | Freq: Once | INTRAVENOUS | Status: AC
  Administered 2014-09-16: 510 mg via INTRAVENOUS
  Filled 2014-09-16: qty 17

## 2014-09-16 NOTE — Patient Instructions (Signed)

## 2014-09-17 NOTE — Telephone Encounter (Signed)
Note opened in error.

## 2014-09-29 ENCOUNTER — Other Ambulatory Visit (HOSPITAL_BASED_OUTPATIENT_CLINIC_OR_DEPARTMENT_OTHER): Payer: Medicare Other

## 2014-09-29 ENCOUNTER — Ambulatory Visit (HOSPITAL_BASED_OUTPATIENT_CLINIC_OR_DEPARTMENT_OTHER): Payer: Medicare Other

## 2014-09-29 VITALS — BP 124/54 | HR 77 | Temp 97.6°F | Resp 20

## 2014-09-29 DIAGNOSIS — E780 Pure hypercholesterolemia: Secondary | ICD-10-CM | POA: Diagnosis not present

## 2014-09-29 DIAGNOSIS — D508 Other iron deficiency anemias: Secondary | ICD-10-CM

## 2014-09-29 DIAGNOSIS — K31819 Angiodysplasia of stomach and duodenum without bleeding: Secondary | ICD-10-CM

## 2014-09-29 DIAGNOSIS — D500 Iron deficiency anemia secondary to blood loss (chronic): Secondary | ICD-10-CM

## 2014-09-29 DIAGNOSIS — K922 Gastrointestinal hemorrhage, unspecified: Secondary | ICD-10-CM | POA: Diagnosis not present

## 2014-09-29 DIAGNOSIS — D539 Nutritional anemia, unspecified: Secondary | ICD-10-CM

## 2014-09-29 LAB — CBC & DIFF AND RETIC
BASO%: 0.3 % (ref 0.0–2.0)
Basophils Absolute: 0 10*3/uL (ref 0.0–0.1)
EOS ABS: 0.1 10*3/uL (ref 0.0–0.5)
EOS%: 2 % (ref 0.0–7.0)
HCT: 32.6 % — ABNORMAL LOW (ref 34.8–46.6)
HGB: 10.7 g/dL — ABNORMAL LOW (ref 11.6–15.9)
IMMATURE RETIC FRACT: 8 % (ref 1.60–10.00)
LYMPH#: 0.5 10*3/uL — AB (ref 0.9–3.3)
LYMPH%: 15.5 % (ref 14.0–49.7)
MCH: 33 pg (ref 25.1–34.0)
MCHC: 32.8 g/dL (ref 31.5–36.0)
MCV: 100.6 fL (ref 79.5–101.0)
MONO#: 0.4 10*3/uL (ref 0.1–0.9)
MONO%: 11.5 % (ref 0.0–14.0)
NEUT%: 70.7 % (ref 38.4–76.8)
NEUTROS ABS: 2.5 10*3/uL (ref 1.5–6.5)
PLATELETS: 181 10*3/uL (ref 145–400)
RBC: 3.24 10*6/uL — AB (ref 3.70–5.45)
RDW: 14 % (ref 11.2–14.5)
RETIC CT ABS: 81 10*3/uL (ref 33.70–90.70)
Retic %: 2.5 % — ABNORMAL HIGH (ref 0.70–2.10)
WBC: 3.5 10*3/uL — ABNORMAL LOW (ref 3.9–10.3)

## 2014-09-29 LAB — FERRITIN CHCC: FERRITIN: 374 ng/mL — AB (ref 9–269)

## 2014-09-29 MED ORDER — SODIUM CHLORIDE 0.9 % IV SOLN
Freq: Once | INTRAVENOUS | Status: AC
  Administered 2014-09-29: 13:00:00 via INTRAVENOUS

## 2014-09-29 MED ORDER — FERUMOXYTOL INJECTION 510 MG/17 ML
510.0000 mg | Freq: Once | INTRAVENOUS | Status: AC
  Administered 2014-09-29: 510 mg via INTRAVENOUS
  Filled 2014-09-29: qty 17

## 2014-09-29 NOTE — Patient Instructions (Signed)

## 2014-09-29 NOTE — Progress Notes (Signed)
Cancel 10/14/14 appts per Dr. Elson Areas

## 2014-09-30 ENCOUNTER — Other Ambulatory Visit: Payer: Self-pay | Admitting: *Deleted

## 2014-10-04 ENCOUNTER — Telehealth: Payer: Self-pay | Admitting: *Deleted

## 2014-10-04 DIAGNOSIS — M810 Age-related osteoporosis without current pathological fracture: Secondary | ICD-10-CM | POA: Diagnosis not present

## 2014-10-04 DIAGNOSIS — Z0000 Encounter for general adult medical examination without abnormal findings: Secondary | ICD-10-CM | POA: Diagnosis not present

## 2014-10-04 DIAGNOSIS — Z23 Encounter for immunization: Secondary | ICD-10-CM | POA: Diagnosis not present

## 2014-10-04 NOTE — Telephone Encounter (Signed)
Pt's son called about lab results.  He brought a Rx from Dr. Shelia Media on her last lab appt for Lipid, CMP, TSH and Urine.    S/w our lab and they inform labs were drawn here and sent to [facility].  We will not get results they should fax them to the ordering physician.   They can call [facility] at [phone], option number #2.   Informed son of above.  He verbalized understanding and will call [facility] for results.

## 2014-10-07 ENCOUNTER — Ambulatory Visit (HOSPITAL_COMMUNITY)
Admission: RE | Admit: 2014-10-07 | Discharge: 2014-10-07 | Disposition: A | Discharge status: Home / Self Care | Payer: Medicare Other | Source: Ambulatory Visit | Attending: Hematology and Oncology | Admitting: Hematology and Oncology

## 2014-10-07 DIAGNOSIS — G4700 Insomnia, unspecified: Secondary | ICD-10-CM | POA: Diagnosis not present

## 2014-10-07 DIAGNOSIS — M5136 Other intervertebral disc degeneration, lumbar region: Secondary | ICD-10-CM | POA: Diagnosis not present

## 2014-10-07 DIAGNOSIS — Z0000 Encounter for general adult medical examination without abnormal findings: Secondary | ICD-10-CM | POA: Diagnosis not present

## 2014-10-07 DIAGNOSIS — F419 Anxiety disorder, unspecified: Secondary | ICD-10-CM | POA: Diagnosis not present

## 2014-10-07 DIAGNOSIS — I10 Essential (primary) hypertension: Secondary | ICD-10-CM | POA: Diagnosis not present

## 2014-10-14 ENCOUNTER — Other Ambulatory Visit: Payer: Medicare Other

## 2014-10-21 ENCOUNTER — Encounter: Payer: Medicare Other | Admitting: Interventional Cardiology

## 2014-10-28 ENCOUNTER — Other Ambulatory Visit: Payer: Self-pay | Admitting: Neurology

## 2014-10-28 ENCOUNTER — Other Ambulatory Visit (HOSPITAL_BASED_OUTPATIENT_CLINIC_OR_DEPARTMENT_OTHER): Payer: Medicare Other

## 2014-10-28 ENCOUNTER — Ambulatory Visit (HOSPITAL_BASED_OUTPATIENT_CLINIC_OR_DEPARTMENT_OTHER): Payer: Medicare Other

## 2014-10-28 ENCOUNTER — Telehealth: Payer: Self-pay | Admitting: Hematology and Oncology

## 2014-10-28 ENCOUNTER — Encounter: Payer: Self-pay | Admitting: Hematology and Oncology

## 2014-10-28 ENCOUNTER — Ambulatory Visit (HOSPITAL_BASED_OUTPATIENT_CLINIC_OR_DEPARTMENT_OTHER): Payer: Medicare Other | Admitting: Hematology and Oncology

## 2014-10-28 VITALS — BP 110/51 | HR 76 | Temp 98.4°F | Resp 18 | Ht 62.0 in | Wt 200.8 lb

## 2014-10-28 DIAGNOSIS — I10 Essential (primary) hypertension: Secondary | ICD-10-CM

## 2014-10-28 DIAGNOSIS — D508 Other iron deficiency anemias: Secondary | ICD-10-CM

## 2014-10-28 DIAGNOSIS — K922 Gastrointestinal hemorrhage, unspecified: Secondary | ICD-10-CM | POA: Diagnosis not present

## 2014-10-28 DIAGNOSIS — K31819 Angiodysplasia of stomach and duodenum without bleeding: Secondary | ICD-10-CM

## 2014-10-28 DIAGNOSIS — D500 Iron deficiency anemia secondary to blood loss (chronic): Secondary | ICD-10-CM | POA: Diagnosis not present

## 2014-10-28 DIAGNOSIS — D539 Nutritional anemia, unspecified: Secondary | ICD-10-CM

## 2014-10-28 LAB — CBC & DIFF AND RETIC
BASO%: 0.2 % (ref 0.0–2.0)
Basophils Absolute: 0 10*3/uL (ref 0.0–0.1)
EOS ABS: 0.1 10*3/uL (ref 0.0–0.5)
EOS%: 2.9 % (ref 0.0–7.0)
HEMATOCRIT: 36 % (ref 34.8–46.6)
HGB: 12 g/dL (ref 11.6–15.9)
Immature Retic Fract: 8.4 % (ref 1.60–10.00)
LYMPH%: 19.2 % (ref 14.0–49.7)
MCH: 33.2 pg (ref 25.1–34.0)
MCHC: 33.3 g/dL (ref 31.5–36.0)
MCV: 99.7 fL (ref 79.5–101.0)
MONO#: 0.5 10*3/uL (ref 0.1–0.9)
MONO%: 11.9 % (ref 0.0–14.0)
NEUT#: 3 10*3/uL (ref 1.5–6.5)
NEUT%: 65.8 % (ref 38.4–76.8)
Platelets: 226 10*3/uL (ref 145–400)
RBC: 3.61 10*6/uL — ABNORMAL LOW (ref 3.70–5.45)
RDW: 13.4 % (ref 11.2–14.5)
Retic %: 2.15 % — ABNORMAL HIGH (ref 0.70–2.10)
Retic Ct Abs: 77.62 10*3/uL (ref 33.70–90.70)
WBC: 4.5 10*3/uL (ref 3.9–10.3)
lymph#: 0.9 10*3/uL (ref 0.9–3.3)

## 2014-10-28 LAB — FERRITIN CHCC: Ferritin: 267 ng/ml (ref 9–269)

## 2014-10-28 MED ORDER — SODIUM CHLORIDE 0.9 % IV SOLN
510.0000 mg | Freq: Once | INTRAVENOUS | Status: AC
  Administered 2014-10-28: 510 mg via INTRAVENOUS
  Filled 2014-10-28: qty 17

## 2014-10-28 MED ORDER — SODIUM CHLORIDE 0.9 % IV SOLN
INTRAVENOUS | Status: DC
  Administered 2014-10-28: 12:00:00 via INTRAVENOUS

## 2014-10-28 NOTE — Telephone Encounter (Signed)
Gave and printed appt sched anda vs for pt for OCT thru Feb 2017

## 2014-10-28 NOTE — Progress Notes (Signed)
[facility] OFFICE PROGRESS NOTE  Horatio Pel, MD SUMMARY OF HEMATOLOGIC HISTORY:  She was found to have abnormal CBC from recent blood count monitoring. The patient have chronic iron deficiency anemia due to recurrent GI bleed. 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 On 09/30/2013, bone marrow biopsy was done which showed no evidence to suggest myelodysplastic syndrome From July 2015 to May 2016, she has received numerous intravenous iron infusion On 07/08/2014, repeat endoscopy revealed multiple AVMs and GAVE and she have repeat ablation therapy INTERVAL HISTORY: Sophia [name] 81 y.o. female returns for further follow-up. She continues to complain of fatigue. The patient denies any recent signs or symptoms of bleeding such as spontaneous epistaxis, hematuria or hematochezia.   I have reviewed the past medical history, past surgical history, [name]; risedronate sodium; sertraline; fosamax ; lipitor; pregabalin; tolterodine tartrate; and wasp venom.  MEDICATIONS:  Current Outpatient Prescriptions  Medication Sig Dispense Refill  . ALPRAZolam (XANAX) 0.5 MG tablet Take 0.25 mg by mouth 2 (two) times daily as needed for anxiety.     . calcium citrate-vitamin D (CITRACAL+D) 315-200 MG-UNIT per tablet Take 1 tablet by mouth daily.     . Cholecalciferol (VITAMIN D3) 1000 UNITS CAPS Take 1,000 Units by mouth daily. Take 1 tablet by mouth once daily    . denosumab (PROLIA) 60 MG/ML SOLN Inject 60 mg into the skin every 6 (six) months.     . dexlansoprazole (DEXILANT) 60 MG capsule Take 1 capsule (60 mg total) by mouth daily. 90 capsule 2  . EPINEPHrine (EPIPEN JR) 0.15 MG/0.3ML injection Inject 0.3 mg into the muscle as needed for anaphylaxis.    . ferumoxytol (FERAHEME) 510 MG/17ML  SOLN injection Inject 510 mg into the vein See admin instructions. When iron is low. Last dose 06/10/14    . fluorometholone (FML) 0.1 % ophthalmic suspension Place 1 drop into the left eye daily.     . folic acid (FOLVITE) 035 MCG tablet Take 800 mcg by mouth daily.    . furosemide (LASIX) 40 MG tablet Take 40 mg by mouth every other day.     . gabapentin (NEURONTIN) 400 MG tablet Take 400 mg by mouth 3 (three) times daily.     [name] HYDROcodone-acetaminophen (NORCO/VICODIN) 5-325 MG per tablet Take 1-2 tablets by mouth every 6 (six) hours as needed for pain.     . hydroxypropyl methylcellulose / hypromellose (ISOPTO TEARS / GONIOVISC) 2.5 % ophthalmic solution Place 1-2 drops into both eyes 3 (three) times daily as needed for dry eyes.     . verapamil (VERELAN PM) 240 MG 24 hr capsule Take 1 tablet by mouth daily    . zolpidem (AMBIEN) 5 MG tablet Take 5 mg by mouth at bedtime as needed for sleep.     No current facility-administered medications for this visit.   Facility-Administered Medications Ordered in Other Visits  Medication Dose Route Frequency Provider Last Rate Last Dose  . 0.9 %  sodium chloride infusion   Intravenous Continuous [name], MD   Stopped at 10/28/14 1338     REVIEW OF SYSTEMS:   Constitutional: Denies fevers, [name], nose, mouth, throat, [name], [name], chest discomfort or lower extremity  swelling Gastrointestinal:  Denies nausea, [name], [name], no new changes  All other systems were reviewed with the patient and are negative.  PHYSICAL EXAMINATION: ECOG PERFORMANCE STATUS: 0 - Asymptomatic  Filed  Vitals:   10/28/14 1045  BP: 110/51  Pulse:   Temp:   Resp:    Filed Weights   10/28/14 1044  Weight: 200 lb 12.8 oz (91.082 kg)    GENERAL:alert, [name], texture, turgor are normal, [name], [name], [name], no focal motor/sensory deficits  LABORATORY DATA:  I have reviewed the data as listed Results for orders placed or performed in visit on 10/28/14 (from the past 48 hour(s))  CBC & Diff and Retic     Status: Abnormal   Collection Time: 10/28/14 10:24 AM  Result Value Ref Range   WBC 4.5 3.9 - 10.3 10e3/uL   NEUT# 3.0 1.5 - 6.5 10e3/uL   HGB 12.0 11.6 - 15.9 g/dL   HCT 36.0 34.8 - 46.6 %   Platelets 226 145 - 400 10e3/uL   MCV 99.7 79.5 - 101.0 fL   MCH 33.2 25.1 - 34.0 pg   MCHC 33.3 31.5 - 36.0 g/dL   RBC 3.61 (L) 3.70 - 5.45 10e6/uL   RDW 13.4 11.2 - 14.5 %   lymph# 0.9 0.9 - 3.3 10e3/uL   MONO# 0.5 0.1 - 0.9 10e3/uL   Eosinophils Absolute 0.1 0.0 - 0.5 10e3/uL   Basophils Absolute 0.0 0.0 - 0.1 10e3/uL   NEUT% 65.8 38.4 - 76.8 %   LYMPH% 19.2 14.0 - 49.7 %   MONO% 11.9 0.0 - 14.0 %   EOS% 2.9 0.0 - 7.0 %   BASO% 0.2 0.0 - 2.0 %   Retic % 2.15 (H) 0.70 - 2.10 %   Retic Ct Abs 77.62 33.70 - 90.70 10e3/uL   Immature Retic Fract 8.40 1.60 - 10.00 %  Ferritin     Status: None   Collection Time: 10/28/14 10:24 AM  Result Value Ref Range   Ferritin 267 9 - 269 ng/ml    Lab Results  Component Value Date   WBC 4.5 10/28/2014   HGB 12.0 10/28/2014   HCT 36.0 10/28/2014   MCV 99.7 10/28/2014   PLT 226 10/28/2014   ASSESSMENT & PLAN:  Iron deficiency anemia due to chronic blood loss The primary suspect is severe GI bleed from underlying GAVE. She will receive 2 units of blood transfusion whenever hemoglobin is less than 8 g. She has received  numerous iron infusions. She will continue to come back here on a regular basis for blood check and intermittent iron infusion. Goal is to keep giving her iron infusion until she is no longer anemic or her GI bleed stops.  I will proceed with iron infusion today. In the meantime, she will continue supplementation with folic acid and vitamin B 12 by mouth. Says she has stability of her blood count, I will space out her appointment to a monthly basis. If she is able to maintain serum ferritin greater than 150 consistently, I might adjust future IV iron infusion to every 6 weeks.  Essential hypertension Her blood pressure is low today. I recommend she  her blood pressure medication by taking Lasix in the morning and Verapamil in the evening. If her blood pressure remained low, I recommend she discuss with her primary care doctor for medication adjustment.   All questions were answered. The patient knows to call the clinic with any problems, questions or concerns. No barriers to learning was detected.  I spent 15 minutes counseling the patient face to face. The total time spent in the appointment was 20 minutes and more than 50% was on counseling.     [facility], NI, MD 9/22/20162:12 PM

## 2014-10-28 NOTE — Assessment & Plan Note (Signed)
Her blood pressure is low today. I recommend she her blood pressure medication by taking Lasix in the morning and Verapamil in the evening. If her blood pressure remained low, I recommend she discuss with her primary care doctor for medication adjustment.

## 2014-10-28 NOTE — Assessment & Plan Note (Signed)
The primary suspect is severe GI bleed from underlying GAVE. She will receive 2 units of blood transfusion whenever hemoglobin is less than 8 g. She has received numerous iron infusions. She will continue to come back here on a regular basis for blood check and intermittent iron infusion. Goal is to keep giving her iron infusion until she is no longer anemic or her GI bleed stops.  I will proceed with iron infusion today. In the meantime, she will continue supplementation with folic acid and vitamin B 12 by mouth. Says she has stability of her blood count, I will space out her appointment to a monthly basis. If she is able to maintain serum ferritin greater than 150 consistently, I might adjust future IV iron infusion to every 6 weeks.

## 2014-10-28 NOTE — Patient Instructions (Signed)

## 2014-11-04 NOTE — Patient Outreach (Signed)
[facility]) Care Management  11/04/2014  Sophia [name] Sep 03, 1928 [id number]   Referral from [employer], assigned Johny Shock, RN to outreach for [place] Management services.  Thanks, Ronnell Freshwater. [place], [place] Assistant Phone: [phone] Fax: [phone]

## 2014-11-05 ENCOUNTER — Encounter: Payer: Self-pay | Admitting: *Deleted

## 2014-11-05 ENCOUNTER — Other Ambulatory Visit: Payer: Self-pay | Admitting: *Deleted

## 2014-11-05 NOTE — Patient Outreach (Signed)
[facility]) Care Management  11/05/2014  Sophia [name] May 07, 1928 [id number]   RN Health Coach telephone to patient. [employer] compliance verified. RN Health Coach spoke with patient and described the services available through [employer] Patient declined service. RN Health Coach will send an outreach packet to patient.  [place] Care Management [phone]

## 2014-11-10 NOTE — Patient Outreach (Signed)
[facility]) Care Management  11/10/2014  Sophia [name] 10/16/1928 WC:[id number]   Notification from Johny Shock, RN to close case due to patient refused [place] Management services.  Thanks, Ronnell Freshwater. [place], [place] Assistant Phone: [phone] Fax: [phone]

## 2014-11-25 ENCOUNTER — Other Ambulatory Visit (HOSPITAL_BASED_OUTPATIENT_CLINIC_OR_DEPARTMENT_OTHER): Payer: Medicare Other

## 2014-11-25 ENCOUNTER — Ambulatory Visit (HOSPITAL_BASED_OUTPATIENT_CLINIC_OR_DEPARTMENT_OTHER): Payer: Medicare Other

## 2014-11-25 VITALS — BP 157/52 | HR 88 | Temp 97.7°F

## 2014-11-25 DIAGNOSIS — K31819 Angiodysplasia of stomach and duodenum without bleeding: Secondary | ICD-10-CM

## 2014-11-25 DIAGNOSIS — D500 Iron deficiency anemia secondary to blood loss (chronic): Secondary | ICD-10-CM

## 2014-11-25 DIAGNOSIS — K922 Gastrointestinal hemorrhage, unspecified: Secondary | ICD-10-CM | POA: Diagnosis not present

## 2014-11-25 DIAGNOSIS — D508 Other iron deficiency anemias: Secondary | ICD-10-CM

## 2014-11-25 DIAGNOSIS — D539 Nutritional anemia, unspecified: Secondary | ICD-10-CM

## 2014-11-25 LAB — CBC & DIFF AND RETIC
BASO%: 0.2 % (ref 0.0–2.0)
Basophils Absolute: 0 10*3/uL (ref 0.0–0.1)
EOS%: 1.9 % (ref 0.0–7.0)
Eosinophils Absolute: 0.1 10*3/uL (ref 0.0–0.5)
HCT: 36.2 % (ref 34.8–46.6)
HGB: 12.1 g/dL (ref 11.6–15.9)
Immature Retic Fract: 6.6 % (ref 1.60–10.00)
LYMPH%: 20.7 % (ref 14.0–49.7)
MCH: 33.1 pg (ref 25.1–34.0)
MCHC: 33.4 g/dL (ref 31.5–36.0)
MCV: 98.9 fL (ref 79.5–101.0)
MONO#: 0.5 10*3/uL (ref 0.1–0.9)
MONO%: 13 % (ref 0.0–14.0)
NEUT%: 64.2 % (ref 38.4–76.8)
NEUTROS ABS: 2.7 10*3/uL (ref 1.5–6.5)
Platelets: 203 10*3/uL (ref 145–400)
RBC: 3.66 10*6/uL — ABNORMAL LOW (ref 3.70–5.45)
RDW: 12.9 % (ref 11.2–14.5)
RETIC %: 1.51 % (ref 0.70–2.10)
Retic Ct Abs: 55.27 10*3/uL (ref 33.70–90.70)
WBC: 4.2 10*3/uL (ref 3.9–10.3)
lymph#: 0.9 10*3/uL (ref 0.9–3.3)

## 2014-11-25 LAB — FERRITIN CHCC: Ferritin: 318 ng/ml — ABNORMAL HIGH (ref 9–269)

## 2014-11-25 MED ORDER — SODIUM CHLORIDE 0.9 % IV SOLN
510.0000 mg | Freq: Once | INTRAVENOUS | Status: AC
  Administered 2014-11-25: 510 mg via INTRAVENOUS
  Filled 2014-11-25: qty 17

## 2014-11-25 NOTE — Patient Instructions (Signed)

## 2014-12-21 ENCOUNTER — Encounter: Payer: Medicare Other | Admitting: Interventional Cardiology

## 2014-12-29 ENCOUNTER — Other Ambulatory Visit (HOSPITAL_BASED_OUTPATIENT_CLINIC_OR_DEPARTMENT_OTHER): Payer: Medicare Other

## 2014-12-29 ENCOUNTER — Ambulatory Visit: Payer: Medicare Other

## 2014-12-29 DIAGNOSIS — D508 Other iron deficiency anemias: Secondary | ICD-10-CM

## 2014-12-29 DIAGNOSIS — K31819 Angiodysplasia of stomach and duodenum without bleeding: Secondary | ICD-10-CM

## 2014-12-29 DIAGNOSIS — D500 Iron deficiency anemia secondary to blood loss (chronic): Secondary | ICD-10-CM

## 2014-12-29 LAB — CBC & DIFF AND RETIC
BASO%: 0.5 % (ref 0.0–2.0)
BASOS ABS: 0 10*3/uL (ref 0.0–0.1)
EOS ABS: 0.1 10*3/uL (ref 0.0–0.5)
EOS%: 2.2 % (ref 0.0–7.0)
HCT: 37.5 % (ref 34.8–46.6)
HGB: 12.4 g/dL (ref 11.6–15.9)
IMMATURE RETIC FRACT: 7 % (ref 1.60–10.00)
LYMPH#: 0.8 10*3/uL — AB (ref 0.9–3.3)
LYMPH%: 21.1 % (ref 14.0–49.7)
MCH: 32.5 pg (ref 25.1–34.0)
MCHC: 33.1 g/dL (ref 31.5–36.0)
MCV: 98.4 fL (ref 79.5–101.0)
MONO#: 0.4 10*3/uL (ref 0.1–0.9)
MONO%: 10 % (ref 0.0–14.0)
NEUT%: 66.2 % (ref 38.4–76.8)
NEUTROS ABS: 2.5 10*3/uL (ref 1.5–6.5)
NRBC: 0 % (ref 0–0)
Platelets: 206 10*3/uL (ref 145–400)
RBC: 3.81 10*6/uL (ref 3.70–5.45)
RDW: 13.1 % (ref 11.2–14.5)
RETIC %: 1.73 % (ref 0.70–2.10)
Retic Ct Abs: 65.91 10*3/uL (ref 33.70–90.70)
WBC: 3.7 10*3/uL — AB (ref 3.9–10.3)

## 2014-12-29 NOTE — Progress Notes (Signed)
HGB 12.4 today, Ferritin on 10/22 was 318.  Per Dr. Alvy Bimler, no feraheme infusion needed today.  Spoke to patient and son in lobby.  Given copy of labs with next apt time.  Pt and son verbalized understanding.

## 2014-12-29 NOTE — Progress Notes (Signed)
Per [place] RN ( Dr. Calton Dach nurse) patient's hemoglobin will determine if pt will receive Feraheme today 12/29/14.

## 2014-12-31 ENCOUNTER — Telehealth: Payer: Self-pay | Admitting: *Deleted

## 2014-12-31 LAB — FERRITIN CHCC: FERRITIN: 325 ng/mL — AB (ref 9–269)

## 2014-12-31 NOTE — Telephone Encounter (Signed)
Notified son of Ferritin level was 325 on 11/23.  Pt was not treated this day d/t Hgb was wnl.  Son asks if they should wait for Ferritin results on next visit 12/22?   We can order Ferritin STAT and move the lab appt one hour before infusion to give time to get results if needed.

## 2015-01-03 ENCOUNTER — Other Ambulatory Visit: Payer: Self-pay | Admitting: Hematology and Oncology

## 2015-01-03 DIAGNOSIS — D500 Iron deficiency anemia secondary to blood loss (chronic): Secondary | ICD-10-CM

## 2015-01-03 NOTE — Telephone Encounter (Signed)
PLaced new POF

## 2015-01-04 ENCOUNTER — Telehealth: Payer: Self-pay | Admitting: *Deleted

## 2015-01-04 NOTE — Telephone Encounter (Signed)
I called the patient to advise we received a script request for Dexilant 60 mg.  I told her Dr. Olevia Perches retired.  I told the patient about Dr. Silverio Decamp and Dr. Havery Moros and she chose the female doctor, Dr. Silverio Decamp.  I made her an appointment for 03-09-2015 at 1:30 PM.  Per the patient's request, I called the patient's [name] at [phone] and LM advising him of his mother's appointment on 03-09-2015 at 1:30 PM.  I advised him to call me if he has any questions. Left our number.  Sent Dr. Silverio Decamp a staff message today requesting the Dexilant 60 mg.

## 2015-01-04 NOTE — Telephone Encounter (Signed)
Reviewed appt for 12/22. Lab at 2:00, possible iron at 3:00

## 2015-01-05 ENCOUNTER — Telehealth: Payer: Self-pay | Admitting: *Deleted

## 2015-01-05 MED ORDER — DEXLANSOPRAZOLE 60 MG PO CPDR: DELAYED_RELEASE_CAPSULE | ORAL | Status: DC

## 2015-01-05 NOTE — Telephone Encounter (Signed)
-----   Message from Mauri Pole, MD sent at 01/04/2015  4:25 PM EST ----- Regarding: RE: Med refill, former Dr. Olevia Perches patient Ok to refill until the appt Thanks VN ----- Message -----    From: Tonette Bihari, CMA    Sent: 01/04/2015   3:25 PM      To: Mauri Pole, MD Subject: Med refill, former Dr. Olevia Perches patient          Dr. Silverio Decamp, I received a refill request for Dexilant 60 mg from [facility].  Spoke to patient and she chose to make an appointment with you for 03-09-2015 at 1:30 PM. Will you refill the Dexilant 60 mg for the patient?  Thank you,  Marisue Humble CMA

## 2015-01-11 DIAGNOSIS — I10 Essential (primary) hypertension: Secondary | ICD-10-CM | POA: Diagnosis not present

## 2015-01-11 DIAGNOSIS — K219 Gastro-esophageal reflux disease without esophagitis: Secondary | ICD-10-CM | POA: Diagnosis not present

## 2015-01-11 DIAGNOSIS — F419 Anxiety disorder, unspecified: Secondary | ICD-10-CM | POA: Diagnosis not present

## 2015-01-11 DIAGNOSIS — G4700 Insomnia, unspecified: Secondary | ICD-10-CM | POA: Diagnosis not present

## 2015-01-27 ENCOUNTER — Ambulatory Visit: Payer: Medicare Other

## 2015-01-27 ENCOUNTER — Other Ambulatory Visit (HOSPITAL_BASED_OUTPATIENT_CLINIC_OR_DEPARTMENT_OTHER): Payer: Medicare Other

## 2015-01-27 DIAGNOSIS — D500 Iron deficiency anemia secondary to blood loss (chronic): Secondary | ICD-10-CM

## 2015-01-27 DIAGNOSIS — D508 Other iron deficiency anemias: Secondary | ICD-10-CM

## 2015-01-27 LAB — CBC & DIFF AND RETIC
BASO%: 0.2 % (ref 0.0–2.0)
BASOS ABS: 0 10*3/uL (ref 0.0–0.1)
EOS%: 1.2 % (ref 0.0–7.0)
Eosinophils Absolute: 0.1 10*3/uL (ref 0.0–0.5)
HEMATOCRIT: 38.2 % (ref 34.8–46.6)
HGB: 12.7 g/dL (ref 11.6–15.9)
Immature Retic Fract: 7.2 % (ref 1.60–10.00)
LYMPH%: 12.3 % — ABNORMAL LOW (ref 14.0–49.7)
MCH: 32.9 pg (ref 25.1–34.0)
MCHC: 33.2 g/dL (ref 31.5–36.0)
MCV: 99 fL (ref 79.5–101.0)
MONO#: 0.3 10*3/uL (ref 0.1–0.9)
MONO%: 5.5 % (ref 0.0–14.0)
NEUT#: 4.1 10*3/uL (ref 1.5–6.5)
NEUT%: 80.8 % — AB (ref 38.4–76.8)
PLATELETS: 210 10*3/uL (ref 145–400)
RBC: 3.86 10*6/uL (ref 3.70–5.45)
RDW: 12.7 % (ref 11.2–14.5)
Retic %: 1.44 % (ref 0.70–2.10)
Retic Ct Abs: 55.58 10*3/uL (ref 33.70–90.70)
WBC: 5.1 10*3/uL (ref 3.9–10.3)
lymph#: 0.6 10*3/uL — ABNORMAL LOW (ref 0.9–3.3)

## 2015-01-27 LAB — FERRITIN: Ferritin: 217 ng/ml (ref 9–269)

## 2015-01-27 NOTE — Progress Notes (Signed)
No need for Iron infusion today as pt is not anemic.  Notified son and pt.  Will call with Ferritin level.  They verbalized understanding.

## 2015-01-27 NOTE — Progress Notes (Signed)
Did not meet treatment parameters.

## 2015-02-01 ENCOUNTER — Other Ambulatory Visit: Payer: Self-pay | Admitting: Hematology and Oncology

## 2015-02-22 ENCOUNTER — Telehealth: Payer: Self-pay | Admitting: *Deleted

## 2015-02-22 NOTE — Telephone Encounter (Signed)
Note opened in error.

## 2015-02-24 ENCOUNTER — Ambulatory Visit: Payer: Medicare Other

## 2015-02-24 ENCOUNTER — Other Ambulatory Visit (HOSPITAL_BASED_OUTPATIENT_CLINIC_OR_DEPARTMENT_OTHER): Payer: Medicare Other

## 2015-02-24 DIAGNOSIS — K31819 Angiodysplasia of stomach and duodenum without bleeding: Secondary | ICD-10-CM

## 2015-02-24 DIAGNOSIS — D500 Iron deficiency anemia secondary to blood loss (chronic): Secondary | ICD-10-CM | POA: Diagnosis present

## 2015-02-24 DIAGNOSIS — D508 Other iron deficiency anemias: Secondary | ICD-10-CM

## 2015-02-24 LAB — CBC & DIFF AND RETIC
BASO%: 0 % (ref 0.0–2.0)
BASOS ABS: 0 10*3/uL (ref 0.0–0.1)
EOS%: 2 % (ref 0.0–7.0)
Eosinophils Absolute: 0.1 10*3/uL (ref 0.0–0.5)
HEMATOCRIT: 35.2 % (ref 34.8–46.6)
HGB: 11.6 g/dL (ref 11.6–15.9)
Immature Retic Fract: 7.2 % (ref 1.60–10.00)
LYMPH%: 19.7 % (ref 14.0–49.7)
MCH: 33 pg (ref 25.1–34.0)
MCHC: 33 g/dL (ref 31.5–36.0)
MCV: 100.3 fL (ref 79.5–101.0)
MONO#: 0.5 10*3/uL (ref 0.1–0.9)
MONO%: 10.9 % (ref 0.0–14.0)
NEUT#: 3.1 10*3/uL (ref 1.5–6.5)
NEUT%: 67.4 % (ref 38.4–76.8)
PLATELETS: 203 10*3/uL (ref 145–400)
RBC: 3.51 10*6/uL — ABNORMAL LOW (ref 3.70–5.45)
RDW: 13.4 % (ref 11.2–14.5)
Retic %: 1.92 % (ref 0.70–2.10)
Retic Ct Abs: 67.39 10*3/uL (ref 33.70–90.70)
WBC: 4.6 10*3/uL (ref 3.9–10.3)
lymph#: 0.9 10*3/uL (ref 0.9–3.3)

## 2015-02-24 LAB — FERRITIN: Ferritin: 134 ng/ml (ref 9–269)

## 2015-03-08 ENCOUNTER — Telehealth: Payer: Self-pay | Admitting: Gastroenterology

## 2015-03-08 NOTE — Telephone Encounter (Signed)
Is hoping her refills will  not be declined because appointment on 03-09-15 needs to be cancelled due to severe back pain.

## 2015-03-08 NOTE — Telephone Encounter (Signed)
No charge for her

## 2015-03-09 ENCOUNTER — Ambulatory Visit: Payer: Medicare Other | Admitting: Gastroenterology

## 2015-03-16 ENCOUNTER — Telehealth: Payer: Self-pay | Admitting: Hematology

## 2015-03-16 ENCOUNTER — Telehealth: Payer: Self-pay | Admitting: Gastroenterology

## 2015-03-16 ENCOUNTER — Telehealth: Payer: Self-pay | Admitting: *Deleted

## 2015-03-16 ENCOUNTER — Telehealth: Payer: Self-pay | Admitting: Hematology and Oncology

## 2015-03-16 ENCOUNTER — Other Ambulatory Visit: Payer: Self-pay | Admitting: Hematology and Oncology

## 2015-03-16 MED ORDER — DEXLANSOPRAZOLE 60 MG PO CPDR: DELAYED_RELEASE_CAPSULE | ORAL | Status: DC

## 2015-03-16 NOTE — Telephone Encounter (Signed)
I have adjusted 2/23 appt

## 2015-03-16 NOTE — Telephone Encounter (Signed)
per pof to r/s pt appt-sent MW email to move inf to coordinate MD. Will call pt once reply

## 2015-03-16 NOTE — Telephone Encounter (Signed)
cld pt & left message of appt on 2/23

## 2015-03-16 NOTE — Telephone Encounter (Signed)
L/M for patient that dexilant was sent in to pharmacy

## 2015-03-21 ENCOUNTER — Telehealth: Payer: Self-pay | Admitting: Hematology and Oncology

## 2015-03-21 ENCOUNTER — Telehealth: Payer: Self-pay | Admitting: *Deleted

## 2015-03-21 NOTE — Telephone Encounter (Signed)
R/s patient 2/23 appointment to 3/2 per 2/13 pof

## 2015-03-21 NOTE — Telephone Encounter (Signed)
Patient [name] called in regards to 2/23 appointment change from pm to am. Sophia [name] stated that his mother has another appointment that morning and will be unable to make this one. He would like another appointment for that same week but speaking to [facility] Dr Alvy Bimler next available appointment is nit until March. Forwarded a message to Dr. Calton Dach nurse to advise.

## 2015-03-21 NOTE — Telephone Encounter (Signed)
Called pt's son regarding appt d/t.  Informed him appt available for 2/28 at 1:30 pm.  Unfortunately he has conflicting MD appt same day.

## 2015-03-21 NOTE — Telephone Encounter (Signed)
Dr. Alvy Bimler can see pt on 3/2 at 2 pm.   Informed son to bring pt on 3/2 at 1:30 pm for lab/ 2 pm Dr. Alvy Bimler with Feraheme infusion if needed.   He verbalized understanding.  POF sent.

## 2015-03-31 ENCOUNTER — Ambulatory Visit: Payer: Medicare Other

## 2015-03-31 ENCOUNTER — Ambulatory Visit: Payer: Medicare Other | Admitting: Hematology and Oncology

## 2015-03-31 ENCOUNTER — Other Ambulatory Visit: Payer: Medicare Other

## 2015-04-04 DIAGNOSIS — K219 Gastro-esophageal reflux disease without esophagitis: Secondary | ICD-10-CM | POA: Diagnosis not present

## 2015-04-04 DIAGNOSIS — F419 Anxiety disorder, unspecified: Secondary | ICD-10-CM | POA: Diagnosis not present

## 2015-04-04 DIAGNOSIS — I10 Essential (primary) hypertension: Secondary | ICD-10-CM | POA: Diagnosis not present

## 2015-04-04 DIAGNOSIS — G4700 Insomnia, unspecified: Secondary | ICD-10-CM | POA: Diagnosis not present

## 2015-04-07 ENCOUNTER — Other Ambulatory Visit (HOSPITAL_BASED_OUTPATIENT_CLINIC_OR_DEPARTMENT_OTHER): Payer: Medicare Other

## 2015-04-07 ENCOUNTER — Encounter: Payer: Self-pay | Admitting: Hematology and Oncology

## 2015-04-07 ENCOUNTER — Telehealth: Payer: Self-pay | Admitting: Hematology and Oncology

## 2015-04-07 ENCOUNTER — Ambulatory Visit (HOSPITAL_BASED_OUTPATIENT_CLINIC_OR_DEPARTMENT_OTHER): Payer: Medicare Other | Admitting: Hematology and Oncology

## 2015-04-07 ENCOUNTER — Ambulatory Visit (HOSPITAL_BASED_OUTPATIENT_CLINIC_OR_DEPARTMENT_OTHER): Payer: Medicare Other

## 2015-04-07 VITALS — BP 114/47 | HR 66 | Temp 98.5°F | Resp 18

## 2015-04-07 VITALS — BP 110/56 | HR 79 | Temp 97.9°F | Resp 18 | Wt 204.6 lb

## 2015-04-07 DIAGNOSIS — I350 Nonrheumatic aortic (valve) stenosis: Secondary | ICD-10-CM | POA: Diagnosis not present

## 2015-04-07 DIAGNOSIS — D500 Iron deficiency anemia secondary to blood loss (chronic): Secondary | ICD-10-CM

## 2015-04-07 DIAGNOSIS — D539 Nutritional anemia, unspecified: Secondary | ICD-10-CM

## 2015-04-07 DIAGNOSIS — K31819 Angiodysplasia of stomach and duodenum without bleeding: Secondary | ICD-10-CM

## 2015-04-07 DIAGNOSIS — D508 Other iron deficiency anemias: Secondary | ICD-10-CM

## 2015-04-07 LAB — CBC & DIFF AND RETIC
BASO%: 0.2 % (ref 0.0–2.0)
Basophils Absolute: 0 10*3/uL (ref 0.0–0.1)
EOS ABS: 0.1 10*3/uL (ref 0.0–0.5)
EOS%: 1.2 % (ref 0.0–7.0)
HEMATOCRIT: 32.5 % — AB (ref 34.8–46.6)
HEMOGLOBIN: 10.3 g/dL — AB (ref 11.6–15.9)
IMMATURE RETIC FRACT: 14.1 % — AB (ref 1.60–10.00)
LYMPH#: 0.7 10*3/uL — AB (ref 0.9–3.3)
LYMPH%: 16.5 % (ref 14.0–49.7)
MCH: 31.8 pg (ref 25.1–34.0)
MCHC: 31.7 g/dL (ref 31.5–36.0)
MCV: 100.3 fL (ref 79.5–101.0)
MONO#: 0.4 10*3/uL (ref 0.1–0.9)
MONO%: 9.4 % (ref 0.0–14.0)
NEUT#: 3.1 10*3/uL (ref 1.5–6.5)
NEUT%: 72.7 % (ref 38.4–76.8)
Platelets: 252 10*3/uL (ref 145–400)
RBC: 3.24 10*6/uL — ABNORMAL LOW (ref 3.70–5.45)
RDW: 13.7 % (ref 11.2–14.5)
RETIC %: 2.6 % — AB (ref 0.70–2.10)
RETIC CT ABS: 84.24 10*3/uL (ref 33.70–90.70)
WBC: 4.3 10*3/uL (ref 3.9–10.3)

## 2015-04-07 LAB — FERRITIN: Ferritin: 49 ng/ml (ref 9–269)

## 2015-04-07 MED ORDER — SODIUM CHLORIDE 0.9 % IV SOLN
Freq: Once | INTRAVENOUS | Status: AC
  Administered 2015-04-07: 15:00:00 via INTRAVENOUS

## 2015-04-07 MED ORDER — SODIUM CHLORIDE 0.9 % IV SOLN
510.0000 mg | Freq: Once | INTRAVENOUS | Status: AC
  Administered 2015-04-07: 510 mg via INTRAVENOUS
  Filled 2015-04-07: qty 17

## 2015-04-07 NOTE — Patient Instructions (Signed)

## 2015-04-07 NOTE — Assessment & Plan Note (Signed)
The primary suspect is severe GI bleed from underlying GAVE. She will receive 2 units of blood transfusion whenever hemoglobin is less than 8 g. She has received numerous iron infusions. She will continue to come back here on a regular basis for blood check and intermittent iron infusion. Goal is to keep giving her iron infusion until she is no longer anemic or her GI bleed stops.  I will proceed with iron infusion today. In the meantime, she will continue supplementation with folic acid and vitamin B 12 by mouth. Says she has stability of her blood count, I will space out her appointment to quarterly basis. The most likely cause of her anemia is due to chronic blood loss/malabsorption syndrome. We discussed some of the risks, benefits, and alternatives of intravenous iron infusions. The patient is symptomatic from anemia and the iron level is critically low. She tolerated oral iron supplement poorly and desires to achieved higher levels of iron faster for adequate hematopoesis. Some of the side-effects to be expected including risks of infusion reactions, phlebitis, headaches, nausea and fatigue.  The patient is willing to proceed. Patient education material was dispensed.  Goal is to keep ferritin level greater than 100

## 2015-04-07 NOTE — Assessment & Plan Note (Signed)
There is associated between aortic stenosis and GI bleed. 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 Given her age, surgery is probably not recommended.  Continue medical management  

## 2015-04-07 NOTE — Telephone Encounter (Signed)
Gave and printed appt sched and avs for pt for June °

## 2015-04-07 NOTE — Progress Notes (Signed)
[facility] OFFICE PROGRESS NOTE  Horatio Pel, MD SUMMARY OF HEMATOLOGIC HISTORY:  She was found to have abnormal CBC from recent blood count monitoring. The patient have chronic iron deficiency anemia due to recurrent GI bleed. 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 On 09/30/2013, bone marrow biopsy was done which showed no evidence to suggest myelodysplastic syndrome From July 2015 to present, she has received numerous intravenous iron infusion On 07/08/2014, repeat endoscopy revealed multiple AVMs and GAVE and she have repeat ablation therapy INTERVAL HISTORY: Sophia [name] 82 y.o. female returns for further follow-up. She feels well. Denies chest pain or shortness of breath.  She denies pica. The patient denies any recent signs or symptoms of bleeding such as spontaneous epistaxis, hematuria or hematochezia.  I have reviewed the past medical history, past surgical history, [name]; risedronate sodium; sertraline; fosamax ; lipitor; pregabalin; tolterodine tartrate; and wasp venom.  MEDICATIONS:  Current Outpatient Prescriptions  Medication Sig Dispense Refill  . ALPRAZolam (XANAX) 0.5 MG tablet Take 0.25 mg by mouth 2 (two) times daily as needed for anxiety.     . calcium citrate-vitamin D (CITRACAL+D) 315-200 MG-UNIT per tablet Take 1 tablet by mouth daily.     . Cholecalciferol (VITAMIN D3) 1000 UNITS CAPS Take 1,000 Units by mouth daily. Take 1 tablet by mouth once daily    . denosumab (PROLIA) 60 MG/ML SOLN Inject 60 mg into the skin every 6 (six) months.     . dexlansoprazole (DEXILANT) 60 MG capsule Take 1 tablet by mouth daily every morning. 30 capsule 6  . EPINEPHrine (EPIPEN JR) 0.15 MG/0.3ML injection Inject 0.3 mg into the muscle as needed for anaphylaxis.    .  ferumoxytol (FERAHEME) 510 MG/17ML SOLN injection Inject 510 mg into the vein See admin instructions. When iron is low. Last dose 06/10/14    . fluorometholone (FML) 0.1 % ophthalmic suspension Place 1 drop into the left eye daily.     . folic acid (FOLVITE) 811 MCG tablet Take 800 mcg by mouth daily.    . furosemide (LASIX) 40 MG tablet Take 40 mg by mouth every other day.     . gabapentin (NEURONTIN) 400 MG tablet Take 400 mg by mouth 3 (three) times daily.     [name] HYDROcodone-acetaminophen (NORCO/VICODIN) 5-325 MG per tablet Take 1-2 tablets by mouth every 6 (six) hours as needed for pain.     . hydroxypropyl methylcellulose / hypromellose (ISOPTO TEARS / GONIOVISC) 2.5 % ophthalmic solution Place 1-2 drops into both eyes 3 (three) times daily as needed for dry eyes.     . verapamil (VERELAN PM) 240 MG 24 hr capsule Take 1 tablet by mouth daily    . zolpidem (AMBIEN) 5 MG tablet Take 5 mg by mouth at bedtime as needed for sleep.     No current facility-administered medications for this visit.   Facility-Administered Medications Ordered in Other Visits  Medication Dose Route Frequency Provider Last Rate Last Dose  . ferumoxytol (FERAHEME) 510 mg in sodium chloride 0.9 % 100 mL IVPB  510 mg Intravenous Once [name], MD   510 mg at 04/07/15 1528     REVIEW OF SYSTEMS:   Constitutional: Denies fevers, [name], nose, mouth, throat, and face: Denies mucositis or sore throat Respiratory:  Denies cough, [name], [name], [name], [name], no new changes  All other systems were reviewed with the patient and are negative.  PHYSICAL  EXAMINATION: ECOG PERFORMANCE STATUS: 0 - Asymptomatic  Filed Vitals:   04/07/15 1402  BP: 110/56  Pulse: 79  Temp: 97.9 F (36.6 C)  Resp: 18   Filed Weights   04/07/15 1402  Weight: 204 lb 9.6 oz (92.806 kg)    GENERAL:alert, [name], texture, turgor are normal, [name], [name], sclera clear OROPHARYNX:no exudate, no erythema and lips, buccal mucosa, and tongue normal  NECK: supple, thyroid normal size, non-tender, without nodularity LYMPH:  no palpable lymphadenopathy in the cervical, axillary or inguinal LUNGS: clear to auscultation and percussion with normal breathing effort HEART: regular rate & rhythm with loud systolic murmur and no lower extremity edema ABDOMEN:abdomen soft, non-tender and normal bowel sounds Musculoskeletal:no cyanosis of digits and no clubbing  NEURO: alert & oriented x 3 with fluent speech, no focal motor/sensory deficits  LABORATORY DATA:  I have reviewed the data as listed Results for orders placed or performed in visit on 04/07/15 (from the past 48 hour(s))  CBC & Diff and Retic     Status: Abnormal   Collection Time: 04/07/15  1:43 PM  Result Value Ref Range   WBC 4.3 3.9 - 10.3 10e3/uL   NEUT# 3.1 1.5 - 6.5 10e3/uL   HGB 10.3 (L) 11.6 - 15.9 g/dL   HCT 32.5 (L) 34.8 - 46.6 %   Platelets 252 145 - 400 10e3/uL   MCV 100.3 79.5 - 101.0 fL   MCH 31.8 25.1 - 34.0 pg   MCHC 31.7 31.5 - 36.0 g/dL   RBC 3.24 (L) 3.70 - 5.45 10e6/uL   RDW 13.7 11.2 - 14.5 %   lymph# 0.7 (L) 0.9 - 3.3 10e3/uL   MONO# 0.4 0.1 - 0.9 10e3/uL   Eosinophils Absolute 0.1 0.0 - 0.5 10e3/uL   Basophils Absolute 0.0 0.0 - 0.1 10e3/uL   NEUT% 72.7 38.4 - 76.8 %   LYMPH% 16.5 14.0 - 49.7 %   MONO% 9.4 0.0 - 14.0 %   EOS% 1.2 0.0 - 7.0 %   BASO% 0.2 0.0 - 2.0 %   Retic % 2.60 (H) 0.70 - 2.10 %   Retic Ct Abs 84.24 33.70 - 90.70 10e3/uL   Immature Retic Fract  14.10 (H) 1.60 - 10.00 %  Ferritin     Status: None   Collection Time: 04/07/15  1:43 PM  Result Value Ref Range   Ferritin 49 9 - 269 ng/ml    Lab Results  Component Value Date   WBC 4.3 04/07/2015   HGB 10.3* 04/07/2015   HCT 32.5* 04/07/2015   MCV 100.3 04/07/2015   PLT 252 04/07/2015    ASSESSMENT & PLAN:  Iron deficiency anemia due to chronic blood loss The primary suspect is severe GI bleed from underlying GAVE. She will receive 2 units of blood transfusion whenever hemoglobin is less than 8 g. She has received numerous iron infusions. She will continue to come back here on a regular basis for blood check and intermittent iron infusion. Goal is to keep giving her iron infusion until she is  no longer anemic or her GI bleed stops.  I will proceed with iron infusion today. In the meantime, she will continue supplementation with folic acid and vitamin B 12 by mouth. Says she has stability of her blood count, I will space out her appointment to quarterly basis. The most likely cause of her anemia is due to chronic blood loss/malabsorption syndrome. We discussed some of the risks, benefits, and alternatives of intravenous iron infusions. The patient is symptomatic from anemia and the iron level is critically low. She tolerated oral iron supplement poorly and desires to achieved higher levels of iron faster for adequate hematopoesis. Some of the side-effects to be expected including risks of infusion reactions, phlebitis, headaches, nausea and fatigue.  The patient is willing to proceed. Patient education material was dispensed.  Goal is to keep ferritin level greater than 100  Aortic stenosis There is associated between aortic stenosis and GI bleed. 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 Given her age, surgery is probably not recommended.  Continue medical management    All questions were answered. The patient knows to call the clinic with any problems, questions or concerns. No  barriers to learning was detected.  I spent 15 minutes counseling the patient face to face. The total time spent in the appointment was 20 minutes and more than 50% was on counseling.     Sophia [name], Sophia Dunavan, MD 3/2/20173:37 PM

## 2015-04-08 ENCOUNTER — Other Ambulatory Visit: Payer: Self-pay | Admitting: Hematology and Oncology

## 2015-04-08 ENCOUNTER — Telehealth: Payer: Self-pay | Admitting: Hematology and Oncology

## 2015-04-08 NOTE — Telephone Encounter (Signed)
s.w. pt son per pt request and advised on appts...Marland Kitchenok and aware

## 2015-05-11 DIAGNOSIS — Z961 Presence of intraocular lens: Secondary | ICD-10-CM | POA: Diagnosis not present

## 2015-05-11 DIAGNOSIS — H40013 Open angle with borderline findings, low risk, bilateral: Secondary | ICD-10-CM | POA: Diagnosis not present

## 2015-05-11 DIAGNOSIS — H1811 Bullous keratopathy, right eye: Secondary | ICD-10-CM | POA: Diagnosis not present

## 2015-05-11 DIAGNOSIS — H353122 Nonexudative age-related macular degeneration, left eye, intermediate dry stage: Secondary | ICD-10-CM | POA: Diagnosis not present

## 2015-05-11 DIAGNOSIS — H353112 Nonexudative age-related macular degeneration, right eye, intermediate dry stage: Secondary | ICD-10-CM | POA: Diagnosis not present

## 2015-05-11 DIAGNOSIS — H1812 Bullous keratopathy, left eye: Secondary | ICD-10-CM | POA: Diagnosis not present

## 2015-05-11 DIAGNOSIS — H1859 Other hereditary corneal dystrophies: Secondary | ICD-10-CM | POA: Diagnosis not present

## 2015-05-16 ENCOUNTER — Ambulatory Visit (INDEPENDENT_AMBULATORY_CARE_PROVIDER_SITE_OTHER): Payer: Medicare Other | Admitting: Gastroenterology

## 2015-05-16 ENCOUNTER — Encounter: Payer: Self-pay | Admitting: Gastroenterology

## 2015-05-16 VITALS — BP 132/84 | HR 88 | Ht 62.0 in | Wt 207.4 lb

## 2015-05-16 DIAGNOSIS — K31819 Angiodysplasia of stomach and duodenum without bleeding: Secondary | ICD-10-CM

## 2015-05-16 DIAGNOSIS — D509 Iron deficiency anemia, unspecified: Secondary | ICD-10-CM | POA: Diagnosis not present

## 2015-05-16 NOTE — Progress Notes (Signed)
Sophia [name]    RE:[id number]    [birth year]  Primary Care Physician:PHARR,[name], MD  Referring Physician: [name], MD [address], [place] [address]  Chief complaint:  Iron deficiency anemia, GAVE  HPI: 82 year old white female previously followed by Dr. Olevia Perches is here to establish care with me. 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 She underwent APC ablation of the gastric lesions in 2014,  March 2016 and June 2016.  She denies any abdominal pain, melena or visible blood per rectum.  Last colonoscopy 2012 showed benign polyps. She denies any GI symptoms. She is following with Dr. Alvy Bimler and is receiving regular iron infusions, the frequency of infusion has decreased and she is maintaining her hemoglobin around 10-12 g.  Outpatient Encounter Prescriptions as of 05/16/2015  Medication Sig  . ALPRAZolam (XANAX) 0.5 MG tablet Take 0.25 mg by mouth 2 (two) times daily as needed for anxiety.   . calcium citrate-vitamin D (CITRACAL+D) 315-200 MG-UNIT per tablet Take 1 tablet by mouth daily.   . Cholecalciferol (VITAMIN D3) 1000 UNITS CAPS Take 1,000 Units by mouth daily. Take 1 tablet by mouth once daily  . denosumab (PROLIA) 60 MG/ML SOLN Inject 60 mg into the skin every 6 (six) months.   . dexlansoprazole (DEXILANT) 60 MG capsule Take 1 tablet by mouth daily every morning.  [name] EPINEPHrine (EPIPEN JR) 0.15 MG/0.3ML injection Inject 0.3 mg into the muscle as needed for anaphylaxis.  . ferumoxytol (FERAHEME) 510 MG/17ML SOLN injection Inject 510 mg into the vein See admin instructions. When iron is low. Last dose 06/10/14  . fluorometholone (FML) 0.1 % ophthalmic suspension Place 1 drop into the left eye daily.   . folic acid (FOLVITE) Q000111Q MCG tablet Take 800 mcg by mouth daily.  . furosemide (LASIX) 40 MG tablet Take 40 mg by mouth every other day.   . gabapentin (NEURONTIN) 400 MG tablet Take 400 mg by mouth 3 (three)  times daily.   [name] HYDROcodone-acetaminophen (NORCO) 7.5-325 MG tablet Take 1-2 tablets by mouth every 6 hours as needed for pain  . hydroxypropyl methylcellulose / hypromellose (ISOPTO TEARS / GONIOVISC) 2.5 % ophthalmic solution Place 1-2 drops into both eyes 3 (three) times daily as needed for dry eyes.   . verapamil (VERELAN PM) 240 MG 24 hr capsule Take 1 tablet by mouth daily  . zolpidem (AMBIEN) 5 MG tablet Take 5 mg by mouth at bedtime as needed for sleep.  . [DISCONTINUED] HYDROcodone-acetaminophen (NORCO/VICODIN) 5-325 MG per tablet Take 1-2 tablets by mouth every 6 (six) hours as needed for pain.    No facility-administered encounter medications on file as of 05/16/2015.    Allergies as of 05/16/2015 - Review Complete 05/16/2015  Allergen Reaction Noted  . Nutritional supplements Anaphylaxis 01/09/2011  . [name] Shortness Of Breath   . Sertraline Shortness Of Breath 11/07/2011  . [name]  [alendronate sodium] Other (See Comments) 08/24/2013  . Lipitor [atorvastatin] Other (See Comments) 11/07/2011  . Pregabalin Nausea And Vomiting 01/09/2011  . [name] Nausea And Vomiting   . Wasp venom Hives 08/25/2013    Past Medical History  Diagnosis Date  . Congestive heart failure ([place])   . Aortic stenosis   . Hypertension   . Dyslipidemia   . Depression   . Duodenitis 03/15/10    per capsule endoscopy report  . Hemorrhagic gastritis 03/15/10    per capsule endoscopy report  .  Diverticulosis 03/14/10  . Hyperplastic colon polyp   . DVT (deep venous thrombosis) ([place])     pt denies  . Anxiety   . Arthritis   . Blood transfusion   . Cataracts, bilateral     removed  . GERD (gastroesophageal reflux disease)   . Heart murmur   . Hyperlipidemia   . Hiatal hernia   . Osteoporosis   . Neuropathy ([facility])     bil big toes  . Presbyesophagus   . GAVE (gastric antral vascular ectasia)   . Anemia   . Unspecified deficiency anemia 08/24/2013    Iron infusion      Past Surgical History  Procedure Laterality Date  . Cholecystectomy    . Orif wrist fracture Right   . Fracture left knee  1993    x 2   . Foot surgery  1998  . Tubal ligation    . Plantar fasciitis repair      left  . Cataract extraction, bilateral    . Esophagogastroduodenoscopy N/A 04/28/2012    Procedure: ESOPHAGOGASTRODUODENOSCOPY (EGD);  Surgeon: Lafayette Dragon, MD;  Location: [facility];  Service: Endoscopy;  Laterality: N/A;  . Hot hemostasis N/A 04/28/2012    Procedure: HOT HEMOSTASIS (ARGON PLASMA COAGULATION/BICAP);  Surgeon: Lafayette Dragon, MD;  Location: [facility];  Service: Endoscopy;  Laterality: N/A;  . Left eye cornea  surgery  yrs ago  . Esophagogastroduodenoscopy N/A 04/12/2014    Procedure: ESOPHAGOGASTRODUODENOSCOPY (EGD);  Surgeon: Lafayette Dragon, MD;  Location: [facility];  Service: Endoscopy;  Laterality: N/A;  . Hot hemostasis N/A 04/12/2014    Procedure: HOT HEMOSTASIS (ARGON PLASMA COAGULATION/BICAP);  Surgeon: Lafayette Dragon, MD;  Location: [facility];  Service: Endoscopy;  Laterality: N/A;  . Hot hemostasis N/A 07/08/2014    Procedure: HOT HEMOSTASIS (ARGON PLASMA COAGULATION/BICAP);  Surgeon: Lafayette Dragon, MD;  Location: [facility];  Service: Endoscopy;  Laterality: N/A;  . Enteroscopy N/A 07/08/2014    Procedure: ENTEROSCOPY;  Surgeon: Lafayette Dragon, MD;  Location: [facility] END[facility];  Service: Endoscopy;  Laterality: N/A;    Family History  Problem Relation Age of Onset  . Uterine cancer Sister   . Prostate cancer Father   . Kidney cancer Brother   . Kidney cancer Son   . Colon cancer Sister     mets from uterine cancer    Social History   Social History  . Marital Status: Widowed    Spouse Name: N/A  . Number of Children: N/A  . Years of Education: N/A   Occupational History  . retired    Social History Main Topics  . Smoking status: Former Smoker -- 0.50 packs/day for 20 years    Types: Cigarettes    Quit date: 01/08/1989  . Smokeless  tobacco: Never Used  . Alcohol Use: No  . Drug Use: No  . Sexual Activity: No   Other Topics Concern  . Not on file   Social History Narrative      Review of systems: Review of Systems  Constitutional: Negative for fever and chills.  HENT: Negative.   Eyes: Negative for blurred vision.  Respiratory: Negative for cough, shortness of breath and wheezing.   Cardiovascular: Negative for chest pain and palpitations.  Gastrointestinal: as per HPI Genitourinary: Negative for dysuria, urgency, frequency and hematuria.  Musculoskeletal: Negative for myalgias, back pain and joint pain.  Skin: Negative for itching and rash.  Neurological: Negative for dizziness, tremors, focal weakness, seizures and loss of consciousness.  Endo/Heme/Allergies: Negative for environmental allergies.  Psychiatric/Behavioral: Negative for depression, suicidal ideas and hallucinations.  All other systems reviewed and are negative.   Physical Exam: Filed Vitals:   05/16/15 1502  BP: 132/84  Pulse: 88   Gen:      No acute distress HEENT:  EOMI, sclera anicteric Neck:     No masses; no thyromegaly Lungs:    Clear to auscultation bilaterally; normal respiratory effort CV:         Regular rate and rhythm; no murmurs Abd:      + bowel sounds; soft, non-tender; no palpable masses, no distension Ext:    No edema; adequate peripheral perfusion Skin:      Warm and dry; no rash Neuro: alert and oriented x 3 Psych: normal mood and affect  Data Reviewed: Enteroscopy 07/2014 1.GAVE 2. APC ablation of the gastric antral vascular lesions 3.no inflammatory process noted of 110 cm from the home oral cavity. 4. Jejunal diverticuli   Assessment and Plan/Recommendations: 82 year old female with chronic iron deficiency anemia secondary chronic GI occult blood loss in the setting of gastric antral vascular ectasia.  We will hold off EGD with APC unless she develops overt GI bleed  She'll continue to follow with Dr.  Alvy Bimler and get iron infusions as needed Return in 1 yr  K. Denzil Magnuson , MD [phone] Mon-Fri 8a-5p [phone] after 5p, weekends, holidays

## 2015-05-16 NOTE — Patient Instructions (Signed)
Follow up in a year  Dexilant refills

## 2015-05-19 ENCOUNTER — Other Ambulatory Visit (HOSPITAL_BASED_OUTPATIENT_CLINIC_OR_DEPARTMENT_OTHER): Payer: Medicare Other

## 2015-05-19 ENCOUNTER — Ambulatory Visit (HOSPITAL_BASED_OUTPATIENT_CLINIC_OR_DEPARTMENT_OTHER): Payer: Medicare Other

## 2015-05-19 VITALS — BP 99/81 | HR 86 | Temp 98.1°F | Resp 18

## 2015-05-19 DIAGNOSIS — K31819 Angiodysplasia of stomach and duodenum without bleeding: Secondary | ICD-10-CM

## 2015-05-19 DIAGNOSIS — D508 Other iron deficiency anemias: Secondary | ICD-10-CM

## 2015-05-19 DIAGNOSIS — D500 Iron deficiency anemia secondary to blood loss (chronic): Secondary | ICD-10-CM | POA: Diagnosis present

## 2015-05-19 DIAGNOSIS — D539 Nutritional anemia, unspecified: Secondary | ICD-10-CM

## 2015-05-19 LAB — CBC & DIFF AND RETIC
BASO%: 0.3 % (ref 0.0–2.0)
BASOS ABS: 0 10*3/uL (ref 0.0–0.1)
EOS ABS: 0.1 10*3/uL (ref 0.0–0.5)
EOS%: 2.4 % (ref 0.0–7.0)
HEMATOCRIT: 29.8 % — AB (ref 34.8–46.6)
HEMOGLOBIN: 9.3 g/dL — AB (ref 11.6–15.9)
Immature Retic Fract: 17.4 % — ABNORMAL HIGH (ref 1.60–10.00)
LYMPH%: 16.6 % (ref 14.0–49.7)
MCH: 31.7 pg (ref 25.1–34.0)
MCHC: 31.2 g/dL — ABNORMAL LOW (ref 31.5–36.0)
MCV: 101.7 fL — AB (ref 79.5–101.0)
MONO#: 0.4 10*3/uL (ref 0.1–0.9)
MONO%: 10.7 % (ref 0.0–14.0)
NEUT#: 2.6 10*3/uL (ref 1.5–6.5)
NEUT%: 70 % (ref 38.4–76.8)
Platelets: 201 10*3/uL (ref 145–400)
RBC: 2.93 10*6/uL — ABNORMAL LOW (ref 3.70–5.45)
RDW: 14.4 % (ref 11.2–14.5)
Retic %: 2.43 % — ABNORMAL HIGH (ref 0.70–2.10)
Retic Ct Abs: 71.2 10*3/uL (ref 33.70–90.70)
WBC: 3.7 10*3/uL — ABNORMAL LOW (ref 3.9–10.3)
lymph#: 0.6 10*3/uL — ABNORMAL LOW (ref 0.9–3.3)

## 2015-05-19 MED ORDER — SODIUM CHLORIDE 0.9 % IV SOLN
Freq: Once | INTRAVENOUS | Status: AC
  Administered 2015-05-19: 15:00:00 via INTRAVENOUS

## 2015-05-19 MED ORDER — SODIUM CHLORIDE 0.9 % IV SOLN
510.0000 mg | Freq: Once | INTRAVENOUS | Status: AC
  Administered 2015-05-19: 510 mg via INTRAVENOUS
  Filled 2015-05-19: qty 17

## 2015-05-19 NOTE — Patient Instructions (Signed)

## 2015-05-20 LAB — FERRITIN: Ferritin: 54 ng/ml (ref 9–269)

## 2015-07-05 DIAGNOSIS — G4700 Insomnia, unspecified: Secondary | ICD-10-CM | POA: Diagnosis not present

## 2015-07-05 DIAGNOSIS — F419 Anxiety disorder, unspecified: Secondary | ICD-10-CM | POA: Diagnosis not present

## 2015-07-05 DIAGNOSIS — K219 Gastro-esophageal reflux disease without esophagitis: Secondary | ICD-10-CM | POA: Diagnosis not present

## 2015-07-05 DIAGNOSIS — I10 Essential (primary) hypertension: Secondary | ICD-10-CM | POA: Diagnosis not present

## 2015-07-07 ENCOUNTER — Ambulatory Visit (HOSPITAL_BASED_OUTPATIENT_CLINIC_OR_DEPARTMENT_OTHER): Payer: Medicare Other

## 2015-07-07 ENCOUNTER — Encounter: Payer: Self-pay | Admitting: Hematology and Oncology

## 2015-07-07 ENCOUNTER — Other Ambulatory Visit (HOSPITAL_BASED_OUTPATIENT_CLINIC_OR_DEPARTMENT_OTHER): Payer: Medicare Other

## 2015-07-07 ENCOUNTER — Telehealth: Payer: Self-pay | Admitting: Hematology and Oncology

## 2015-07-07 ENCOUNTER — Ambulatory Visit (HOSPITAL_BASED_OUTPATIENT_CLINIC_OR_DEPARTMENT_OTHER): Payer: Medicare Other | Admitting: Hematology and Oncology

## 2015-07-07 VITALS — BP 164/84 | HR 77 | Temp 98.0°F | Resp 18 | Ht 62.0 in | Wt 213.1 lb

## 2015-07-07 DIAGNOSIS — D500 Iron deficiency anemia secondary to blood loss (chronic): Secondary | ICD-10-CM | POA: Diagnosis not present

## 2015-07-07 DIAGNOSIS — E785 Hyperlipidemia, unspecified: Secondary | ICD-10-CM | POA: Diagnosis not present

## 2015-07-07 DIAGNOSIS — I10 Essential (primary) hypertension: Secondary | ICD-10-CM | POA: Diagnosis not present

## 2015-07-07 DIAGNOSIS — K31819 Angiodysplasia of stomach and duodenum without bleeding: Secondary | ICD-10-CM

## 2015-07-07 DIAGNOSIS — I350 Nonrheumatic aortic (valve) stenosis: Secondary | ICD-10-CM | POA: Diagnosis not present

## 2015-07-07 DIAGNOSIS — D508 Other iron deficiency anemias: Secondary | ICD-10-CM

## 2015-07-07 DIAGNOSIS — E559 Vitamin D deficiency, unspecified: Secondary | ICD-10-CM | POA: Diagnosis not present

## 2015-07-07 DIAGNOSIS — D539 Nutritional anemia, unspecified: Secondary | ICD-10-CM

## 2015-07-07 DIAGNOSIS — I509 Heart failure, unspecified: Secondary | ICD-10-CM | POA: Diagnosis not present

## 2015-07-07 LAB — FERRITIN: FERRITIN: 57 ng/mL (ref 9–269)

## 2015-07-07 LAB — CBC & DIFF AND RETIC
BASO%: 0.3 % (ref 0.0–2.0)
Basophils Absolute: 0 10*3/uL (ref 0.0–0.1)
EOS ABS: 0.1 10*3/uL (ref 0.0–0.5)
EOS%: 3.7 % (ref 0.0–7.0)
HCT: 33.4 % — ABNORMAL LOW (ref 34.8–46.6)
HEMOGLOBIN: 10.4 g/dL — AB (ref 11.6–15.9)
IMMATURE RETIC FRACT: 11.4 % — AB (ref 1.60–10.00)
LYMPH#: 0.8 10*3/uL — AB (ref 0.9–3.3)
LYMPH%: 19.9 % (ref 14.0–49.7)
MCH: 31.4 pg (ref 25.1–34.0)
MCHC: 31.1 g/dL — ABNORMAL LOW (ref 31.5–36.0)
MCV: 100.9 fL (ref 79.5–101.0)
MONO#: 0.3 10*3/uL (ref 0.1–0.9)
MONO%: 8.8 % (ref 0.0–14.0)
NEUT#: 2.5 10*3/uL (ref 1.5–6.5)
NEUT%: 67.3 % (ref 38.4–76.8)
PLATELETS: 231 10*3/uL (ref 145–400)
RBC: 3.31 10*6/uL — ABNORMAL LOW (ref 3.70–5.45)
RDW: 13.9 % (ref 11.2–14.5)
RETIC CT ABS: 77.12 10*3/uL (ref 33.70–90.70)
Retic %: 2.33 % — ABNORMAL HIGH (ref 0.70–2.10)
WBC: 3.8 10*3/uL — AB (ref 3.9–10.3)

## 2015-07-07 MED ORDER — SODIUM CHLORIDE 0.9 % IV SOLN
510.0000 mg | Freq: Once | INTRAVENOUS | Status: AC
  Administered 2015-07-07: 510 mg via INTRAVENOUS
  Filled 2015-07-07: qty 17

## 2015-07-07 NOTE — Assessment & Plan Note (Signed)
There is associated between aortic stenosis and GI bleed. 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 Given her age, surgery is probably not recommended.  Continue medical management  

## 2015-07-07 NOTE — Telephone Encounter (Signed)
Gave pt apt & avs °

## 2015-07-07 NOTE — Assessment & Plan Note (Signed)
she will continue current medical management. I recommend close follow-up with primary care doctor for medication adjustment.  

## 2015-07-07 NOTE — Progress Notes (Signed)
[facility] OFFICE PROGRESS NOTE  Sophia Pel, MD SUMMARY OF HEMATOLOGIC HISTORY:  She was found to have abnormal CBC from recent blood count monitoring. The patient have chronic iron deficiency anemia due to recurrent GI bleed. 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 On 09/30/2013, bone marrow biopsy was done which showed no evidence to suggest myelodysplastic syndrome From July 2015 to present, she has received numerous intravenous iron infusion On 07/08/2014, repeat endoscopy revealed multiple AVMs and GAVE and she have repeat ablation therapy INTERVAL HISTORY: Sophia [name] 82 y.o. female returns for follow-up. She complained of mild fatigue. The patient denies any recent signs or symptoms of bleeding such as spontaneous epistaxis, hematuria or hematochezia.   I have reviewed the past medical history, past surgical history, [name]; risedronate sodium; sertraline; fosamax ; lipitor; pregabalin; tolterodine tartrate; and wasp venom.  MEDICATIONS:  Current Outpatient Prescriptions  Medication Sig Dispense Refill  . ALPRAZolam (XANAX) 0.5 MG tablet Take 0.25 mg by mouth 2 (two) times daily as needed for anxiety.     . calcium citrate-vitamin D (CITRACAL+D) 315-200 MG-UNIT per tablet Take 1 tablet by mouth daily.     . Cholecalciferol (VITAMIN D3) 1000 UNITS CAPS Take 1,000 Units by mouth daily. Take 1 tablet by mouth once daily    . denosumab (PROLIA) 60 MG/ML SOLN Inject 60 mg into the skin every 6 (six) months.     . dexlansoprazole (DEXILANT) 60 MG capsule Take 1 tablet by mouth daily every morning. 30 capsule 6  . EPINEPHrine (EPIPEN JR) 0.15 MG/0.3ML injection Inject 0.3 mg into the muscle as needed for anaphylaxis.    . ferumoxytol (FERAHEME) 510 MG/17ML SOLN injection  Inject 510 mg into the vein See admin instructions. When iron is low. Last dose 06/10/14    . fluorometholone (FML) 0.1 % ophthalmic suspension Place 1 drop into the left eye daily.     . folic acid (FOLVITE) 413 MCG tablet Take 800 mcg by mouth daily.    . furosemide (LASIX) 40 MG tablet Take 40 mg by mouth every other day.     . gabapentin (NEURONTIN) 400 MG tablet Take 400 mg by mouth 3 (three) times daily.     [name] HYDROcodone-acetaminophen (NORCO) 7.5-325 MG tablet Take 1-2 tablets by mouth every 6 hours as needed for pain    . hydroxypropyl methylcellulose / hypromellose (ISOPTO TEARS / GONIOVISC) 2.5 % ophthalmic solution Place 1-2 drops into both eyes 3 (three) times daily as needed for dry eyes.     . verapamil (VERELAN PM) 240 MG 24 hr capsule Take 1 tablet by mouth daily    . zolpidem (AMBIEN) 5 MG tablet Take 5 mg by mouth at bedtime as needed for sleep.     No current facility-administered medications for this visit.     REVIEW OF SYSTEMS:   Constitutional: Denies fevers, [name], nose, mouth, throat, [name], [name], [name], [name], [name], no new changes  All other systems were reviewed with the patient and  are negative.  PHYSICAL EXAMINATION: ECOG PERFORMANCE STATUS: 1 - Symptomatic but completely ambulatory  Filed Vitals:   07/07/15 1413  BP: 164/84  Pulse: 77  Temp: 98 F (36.7 C)  Resp: 18   Filed Weights   07/07/15 1413  Weight: 213 lb 1.6 oz (96.662 kg)    GENERAL:alert, [name], texture, turgor  are normal, [name], [name], sclera clear OROPHARYNX:no exudate, no erythema and lips, buccal mucosa, and tongue normal  NECK: supple, thyroid normal size, non-tender, without nodularity LYMPH:  no palpable lymphadenopathy in the cervical, axillary or inguinal LUNGS: clear to auscultation and percussion with normal breathing effort HEART: regular rate & rhythm with left systolic murmur, and no lower extremity edema ABDOMEN:abdomen soft, non-tender and normal bowel sounds Musculoskeletal:no cyanosis of digits and no clubbing  NEURO: alert & oriented x 3 with fluent speech, no focal motor/sensory deficits  LABORATORY DATA:  I have reviewed the data as listed     Component Value Date/Time   NA 134* 08/27/2014 2026   NA 131* 08/24/2013 1219   K 4.5 08/27/2014 2026   K 4.7 08/24/2013 1219   CL 101 08/27/2014 2026   CO2 25 08/27/2014 2026   CO2 24 08/24/2013 1219   GLUCOSE 115* 08/27/2014 2026   GLUCOSE 88 08/24/2013 1219   BUN 23* 08/27/2014 2026   BUN 15.8 08/24/2013 1219   CREATININE 1.02* 08/27/2014 2026   CREATININE 1.1 08/24/2013 1219   CALCIUM 9.1 08/27/2014 2026   CALCIUM 8.9 08/24/2013 1219   PROT 6.3* 08/27/2014 2026   PROT 6.5 08/24/2013 1219   ALBUMIN 3.7 08/27/2014 2026   ALBUMIN 3.7 08/24/2013 1219   AST 17 08/27/2014 2026   AST 15 08/24/2013 1219   ALT 13* 08/27/2014 2026   ALT 8 08/24/2013 1219   ALKPHOS 51 08/27/2014 2026   ALKPHOS 47 08/24/2013 1219   BILITOT 0.2* 08/27/2014 2026   BILITOT 0.23 08/24/2013 1219   GFRNONAA 49* 08/27/2014 2026   GFRAA 57* 08/27/2014 2026    No results found for: SPEP, UPEP  Lab Results  Component Value Date   WBC 3.8* 07/07/2015   NEUTROABS 2.5 07/07/2015   HGB 10.4* 07/07/2015   HCT 33.4* 07/07/2015   MCV 100.9 07/07/2015   PLT 231 07/07/2015      Chemistry      Component Value Date/Time   NA 134* 08/27/2014 2026   NA 131* 08/24/2013 1219   K  4.5 08/27/2014 2026   K 4.7 08/24/2013 1219   CL 101 08/27/2014 2026   CO2 25 08/27/2014 2026   CO2 24 08/24/2013 1219   BUN 23* 08/27/2014 2026   BUN 15.8 08/24/2013 1219   CREATININE 1.02* 08/27/2014 2026   CREATININE 1.1 08/24/2013 1219      Component Value Date/Time   CALCIUM 9.1 08/27/2014 2026   CALCIUM 8.9 08/24/2013 1219   ALKPHOS 51 08/27/2014 2026   ALKPHOS 47 08/24/2013 1219   AST 17 08/27/2014 2026   AST 15 08/24/2013 1219   ALT 13* 08/27/2014 2026   ALT 8 08/24/2013 1219   BILITOT 0.2* 08/27/2014 2026   BILITOT 0.23 08/24/2013 1219      ASSESSMENT & PLAN:  Iron deficiency anemia due to chronic blood loss The primary suspect is severe GI bleed from underlying GAVE. She will receive 2 units of blood transfusion whenever hemoglobin is less than 8 g. She has received numerous iron infusions.  She will continue to come back here on a regular basis for blood check and intermittent iron infusion. Goal is to keep giving her iron infusion until she is no longer anemic or her GI bleed stops. I will proceed with iron infusion today. In the meantime, she will continue supplementation with folic acid and vitamin B 12 by mouth. Says she has stability of her blood count, I will space out her appointment to every 6 weeks. The most likely cause of her anemia is due to chronic blood loss/malabsorption syndrome. We discussed some of the risks, benefits, and alternatives of intravenous iron infusions. The patient is symptomatic from anemia and the iron level is critically low. She tolerated oral iron supplement poorly and desires to achieved higher levels of iron faster for adequate hematopoesis. Some of the side-effects to be expected including risks of infusion reactions, phlebitis, headaches, nausea and fatigue.  The patient is willing to proceed. Patient education material was dispensed.  Goal is to keep ferritin level greater than 100  Aortic stenosis There is associated between  aortic stenosis and GI bleed. 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 Given her age, surgery is probably not recommended.  Continue medical management   Essential hypertension she will continue current medical management. I recommend close follow-up with primary care doctor for medication adjustment.     All questions were answered. The patient knows to call the clinic with any problems, questions or concerns. No barriers to learning was detected.  I spent 15 minutes counseling the patient face to face. The total time spent in the appointment was 20 minutes and more than 50% was on counseling.     Alvy Bimler, Lachlyn Vanderstelt, MD 6/1/20175:04 PM

## 2015-07-07 NOTE — Assessment & Plan Note (Signed)
The primary suspect is severe GI bleed from underlying GAVE. She will receive 2 units of blood transfusion whenever hemoglobin is less than 8 g. She has received numerous iron infusions. She will continue to come back here on a regular basis for blood check and intermittent iron infusion. Goal is to keep giving her iron infusion until she is no longer anemic or her GI bleed stops. I will proceed with iron infusion today. In the meantime, she will continue supplementation with folic acid and vitamin B 12 by mouth. Says she has stability of her blood count, I will space out her appointment to every 6 weeks. The most likely cause of her anemia is due to chronic blood loss/malabsorption syndrome. We discussed some of the risks, benefits, and alternatives of intravenous iron infusions. The patient is symptomatic from anemia and the iron level is critically low. She tolerated oral iron supplement poorly and desires to achieved higher levels of iron faster for adequate hematopoesis. Some of the side-effects to be expected including risks of infusion reactions, phlebitis, headaches, nausea and fatigue.  The patient is willing to proceed. Patient education material was dispensed.  Goal is to keep ferritin level greater than 100

## 2015-07-07 NOTE — Patient Instructions (Signed)

## 2015-08-18 ENCOUNTER — Other Ambulatory Visit: Payer: Self-pay | Admitting: Hematology and Oncology

## 2015-08-18 ENCOUNTER — Telehealth: Payer: Self-pay | Admitting: *Deleted

## 2015-08-18 ENCOUNTER — Other Ambulatory Visit: Payer: Medicare Other

## 2015-08-18 ENCOUNTER — Ambulatory Visit: Payer: Medicare Other

## 2015-08-18 DIAGNOSIS — D500 Iron deficiency anemia secondary to blood loss (chronic): Secondary | ICD-10-CM

## 2015-08-18 NOTE — Telephone Encounter (Signed)
Dr. Alvy Bimler is aware.

## 2015-08-18 NOTE — Telephone Encounter (Signed)
Son left VM his mother is sick and not able to make her appts today.  I called him back and he is actually in infusion room trying to r/s her appt..  Son came over to office and he informs his mother is just too tired to make it.  She is blaming it on her legs.  I am concerned she may be anemic and suggested to son he encourage her to come tomorrow if possible.  He will r/s appts for tomorrow.  He will call if she cannot make it tomorrow.

## 2015-08-19 ENCOUNTER — Other Ambulatory Visit (HOSPITAL_BASED_OUTPATIENT_CLINIC_OR_DEPARTMENT_OTHER): Payer: Medicare Other

## 2015-08-19 ENCOUNTER — Ambulatory Visit (HOSPITAL_BASED_OUTPATIENT_CLINIC_OR_DEPARTMENT_OTHER): Payer: Medicare Other

## 2015-08-19 VITALS — BP 146/99 | HR 90 | Temp 97.4°F | Resp 20

## 2015-08-19 DIAGNOSIS — D539 Nutritional anemia, unspecified: Secondary | ICD-10-CM

## 2015-08-19 DIAGNOSIS — D508 Other iron deficiency anemias: Secondary | ICD-10-CM

## 2015-08-19 DIAGNOSIS — D500 Iron deficiency anemia secondary to blood loss (chronic): Secondary | ICD-10-CM | POA: Diagnosis present

## 2015-08-19 DIAGNOSIS — K31819 Angiodysplasia of stomach and duodenum without bleeding: Secondary | ICD-10-CM

## 2015-08-19 LAB — CBC & DIFF AND RETIC
BASO%: 0.3 % (ref 0.0–2.0)
BASOS ABS: 0 10*3/uL (ref 0.0–0.1)
EOS%: 3.4 % (ref 0.0–7.0)
Eosinophils Absolute: 0.1 10*3/uL (ref 0.0–0.5)
HEMATOCRIT: 34.6 % — AB (ref 34.8–46.6)
HEMOGLOBIN: 11.1 g/dL — AB (ref 11.6–15.9)
Immature Retic Fract: 9 % (ref 1.60–10.00)
LYMPH#: 0.7 10*3/uL — AB (ref 0.9–3.3)
LYMPH%: 21.8 % (ref 14.0–49.7)
MCH: 31.5 pg (ref 25.1–34.0)
MCHC: 32.1 g/dL (ref 31.5–36.0)
MCV: 98.3 fL (ref 79.5–101.0)
MONO#: 0.3 10*3/uL (ref 0.1–0.9)
MONO%: 8.4 % (ref 0.0–14.0)
NEUT%: 66.1 % (ref 38.4–76.8)
NEUTROS ABS: 2 10*3/uL (ref 1.5–6.5)
Platelets: 178 10*3/uL (ref 145–400)
RBC: 3.52 10*6/uL — ABNORMAL LOW (ref 3.70–5.45)
RDW: 13.6 % (ref 11.2–14.5)
RETIC %: 1.48 % (ref 0.70–2.10)
RETIC CT ABS: 52.1 10*3/uL (ref 33.70–90.70)
WBC: 3 10*3/uL — AB (ref 3.9–10.3)

## 2015-08-19 LAB — FERRITIN: FERRITIN: 61 ng/mL (ref 9–269)

## 2015-08-19 MED ORDER — FERUMOXYTOL INJECTION 510 MG/17 ML
510.0000 mg | Freq: Once | INTRAVENOUS | Status: AC
  Administered 2015-08-19: 510 mg via INTRAVENOUS
  Filled 2015-08-19: qty 17

## 2015-08-19 NOTE — Patient Instructions (Signed)

## 2015-08-22 DIAGNOSIS — H1859 Other hereditary corneal dystrophies: Secondary | ICD-10-CM | POA: Diagnosis not present

## 2015-08-22 DIAGNOSIS — H1851 Endothelial corneal dystrophy: Secondary | ICD-10-CM | POA: Diagnosis not present

## 2015-08-22 DIAGNOSIS — H1813 Bullous keratopathy, bilateral: Secondary | ICD-10-CM | POA: Diagnosis not present

## 2015-08-22 DIAGNOSIS — H40013 Open angle with borderline findings, low risk, bilateral: Secondary | ICD-10-CM | POA: Diagnosis not present

## 2015-09-29 ENCOUNTER — Ambulatory Visit (HOSPITAL_BASED_OUTPATIENT_CLINIC_OR_DEPARTMENT_OTHER): Payer: Medicare Other

## 2015-09-29 ENCOUNTER — Other Ambulatory Visit (HOSPITAL_BASED_OUTPATIENT_CLINIC_OR_DEPARTMENT_OTHER): Payer: Medicare Other

## 2015-09-29 DIAGNOSIS — D500 Iron deficiency anemia secondary to blood loss (chronic): Secondary | ICD-10-CM

## 2015-09-29 DIAGNOSIS — D508 Other iron deficiency anemias: Secondary | ICD-10-CM

## 2015-09-29 LAB — CBC & DIFF AND RETIC
BASO%: 0.2 % (ref 0.0–2.0)
Basophils Absolute: 0 10*3/uL (ref 0.0–0.1)
EOS%: 1.7 % (ref 0.0–7.0)
Eosinophils Absolute: 0.1 10*3/uL (ref 0.0–0.5)
HEMATOCRIT: 38.6 % (ref 34.8–46.6)
HGB: 12.7 g/dL (ref 11.6–15.9)
Immature Retic Fract: 10.3 % — ABNORMAL HIGH (ref 1.60–10.00)
LYMPH#: 0.9 10*3/uL (ref 0.9–3.3)
LYMPH%: 14.8 % (ref 14.0–49.7)
MCH: 32.3 pg (ref 25.1–34.0)
MCHC: 32.9 g/dL (ref 31.5–36.0)
MCV: 98.2 fL (ref 79.5–101.0)
MONO#: 0.4 10*3/uL (ref 0.1–0.9)
MONO%: 7.4 % (ref 0.0–14.0)
NEUT#: 4.5 10*3/uL (ref 1.5–6.5)
NEUT%: 75.9 % (ref 38.4–76.8)
NRBC: 0 % (ref 0–0)
Platelets: 207 10*3/uL (ref 145–400)
RBC: 3.93 10*6/uL (ref 3.70–5.45)
RDW: 13.5 % (ref 11.2–14.5)
RETIC %: 1.73 % (ref 0.70–2.10)
Retic Ct Abs: 67.99 10*3/uL (ref 33.70–90.70)
WBC: 5.9 10*3/uL (ref 3.9–10.3)

## 2015-09-29 LAB — FERRITIN: FERRITIN: 146 ng/mL (ref 9–269)

## 2015-09-29 NOTE — Patient Instructions (Signed)

## 2015-09-29 NOTE — Progress Notes (Signed)
Reviewed labs and patient's assessment and concerns with Dr. Alvy Bimler. Per Dr. Alvy Bimler no need for Feraheme infusion today and pt to report back to clinic on oct 5th for scheduled appointments. Pt and patient's son verbalizes understanding. Pt educated to call clinic with any concerns, changes or increases in symptoms. Pt verbalizes understanding.

## 2015-10-04 ENCOUNTER — Other Ambulatory Visit: Payer: Self-pay | Admitting: Gastroenterology

## 2015-10-05 ENCOUNTER — Other Ambulatory Visit: Payer: Self-pay

## 2015-10-05 DIAGNOSIS — F329 Major depressive disorder, single episode, unspecified: Secondary | ICD-10-CM | POA: Diagnosis not present

## 2015-10-05 DIAGNOSIS — M5136 Other intervertebral disc degeneration, lumbar region: Secondary | ICD-10-CM | POA: Diagnosis not present

## 2015-10-05 DIAGNOSIS — R2689 Other abnormalities of gait and mobility: Secondary | ICD-10-CM | POA: Diagnosis not present

## 2015-10-05 DIAGNOSIS — Z23 Encounter for immunization: Secondary | ICD-10-CM | POA: Diagnosis not present

## 2015-10-05 DIAGNOSIS — G4700 Insomnia, unspecified: Secondary | ICD-10-CM | POA: Diagnosis not present

## 2015-10-05 DIAGNOSIS — M810 Age-related osteoporosis without current pathological fracture: Secondary | ICD-10-CM | POA: Diagnosis not present

## 2015-10-05 DIAGNOSIS — E559 Vitamin D deficiency, unspecified: Secondary | ICD-10-CM | POA: Diagnosis not present

## 2015-10-31 ENCOUNTER — Other Ambulatory Visit: Payer: Self-pay | Admitting: Hematology and Oncology

## 2015-10-31 DIAGNOSIS — D509 Iron deficiency anemia, unspecified: Secondary | ICD-10-CM | POA: Insufficient documentation

## 2015-11-08 DIAGNOSIS — M810 Age-related osteoporosis without current pathological fracture: Secondary | ICD-10-CM | POA: Diagnosis not present

## 2015-11-10 ENCOUNTER — Encounter: Payer: Self-pay | Admitting: Hematology and Oncology

## 2015-11-10 ENCOUNTER — Ambulatory Visit (HOSPITAL_BASED_OUTPATIENT_CLINIC_OR_DEPARTMENT_OTHER): Payer: Medicare Other

## 2015-11-10 ENCOUNTER — Telehealth: Payer: Self-pay

## 2015-11-10 ENCOUNTER — Other Ambulatory Visit (HOSPITAL_BASED_OUTPATIENT_CLINIC_OR_DEPARTMENT_OTHER): Payer: Medicare Other

## 2015-11-10 ENCOUNTER — Ambulatory Visit (HOSPITAL_BASED_OUTPATIENT_CLINIC_OR_DEPARTMENT_OTHER): Payer: Medicare Other | Admitting: Hematology and Oncology

## 2015-11-10 ENCOUNTER — Other Ambulatory Visit: Payer: Self-pay | Admitting: Hematology and Oncology

## 2015-11-10 VITALS — BP 111/55 | HR 71 | Resp 18

## 2015-11-10 DIAGNOSIS — D500 Iron deficiency anemia secondary to blood loss (chronic): Secondary | ICD-10-CM

## 2015-11-10 DIAGNOSIS — K31819 Angiodysplasia of stomach and duodenum without bleeding: Secondary | ICD-10-CM

## 2015-11-10 DIAGNOSIS — D508 Other iron deficiency anemias: Secondary | ICD-10-CM

## 2015-11-10 DIAGNOSIS — D539 Nutritional anemia, unspecified: Secondary | ICD-10-CM

## 2015-11-10 DIAGNOSIS — E559 Vitamin D deficiency, unspecified: Secondary | ICD-10-CM | POA: Diagnosis not present

## 2015-11-10 DIAGNOSIS — I350 Nonrheumatic aortic (valve) stenosis: Secondary | ICD-10-CM

## 2015-11-10 LAB — CBC & DIFF AND RETIC
BASO%: 0.3 % (ref 0.0–2.0)
Basophils Absolute: 0 10*3/uL (ref 0.0–0.1)
EOS ABS: 0.2 10*3/uL (ref 0.0–0.5)
EOS%: 2.5 % (ref 0.0–7.0)
HCT: 32.4 % — ABNORMAL LOW (ref 34.8–46.6)
HEMOGLOBIN: 10.3 g/dL — AB (ref 11.6–15.9)
IMMATURE RETIC FRACT: 12.1 % — AB (ref 1.60–10.00)
LYMPH%: 16.8 % (ref 14.0–49.7)
MCH: 31.7 pg (ref 25.1–34.0)
MCHC: 31.8 g/dL (ref 31.5–36.0)
MCV: 99.7 fL (ref 79.5–101.0)
MONO#: 0.6 10*3/uL (ref 0.1–0.9)
MONO%: 9.6 % (ref 0.0–14.0)
NEUT%: 70.8 % (ref 38.4–76.8)
NEUTROS ABS: 4.2 10*3/uL (ref 1.5–6.5)
Platelets: 221 10*3/uL (ref 145–400)
RBC: 3.25 10*6/uL — ABNORMAL LOW (ref 3.70–5.45)
RDW: 13.8 % (ref 11.2–14.5)
RETIC %: 2.51 % — AB (ref 0.70–2.10)
Retic Ct Abs: 81.58 10*3/uL (ref 33.70–90.70)
WBC: 6 10*3/uL (ref 3.9–10.3)
lymph#: 1 10*3/uL (ref 0.9–3.3)

## 2015-11-10 LAB — FERRITIN: FERRITIN: 45 ng/mL (ref 9–269)

## 2015-11-10 MED ORDER — SODIUM CHLORIDE 0.9 % IV SOLN
510.0000 mg | Freq: Once | INTRAVENOUS | Status: AC
  Administered 2015-11-10: 510 mg via INTRAVENOUS
  Filled 2015-11-10: qty 17

## 2015-11-10 NOTE — Telephone Encounter (Signed)
Appointments made and avs/calendars given      Sophia [name]

## 2015-11-10 NOTE — Progress Notes (Signed)
[facility] OFFICE PROGRESS NOTE  Horatio Pel, MD SUMMARY OF HEMATOLOGIC HISTORY:  She was found to have abnormal CBC from recent blood count monitoring. The patient have chronic iron deficiency anemia due to recurrent GI bleed. 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 On 09/30/2013, bone marrow biopsy was done which showed no evidence to suggest myelodysplastic syndrome From July 2015 to present, she has received numerous intravenous iron infusion On 07/08/2014, repeat endoscopy revealed multiple AVMs and GAVE and she have repeat ablation therapy INTERVAL HISTORY: Sophia [name] 82 y.o. female returns for further follow-up. She complained of mild fatigue. The patient denies any recent signs or symptoms of bleeding such as spontaneous epistaxis, hematuria or hematochezia. She denies recent infection Denies recent chest pain, shortness of breath or dizziness  I have reviewed the past medical history, past surgical history, [name]; risedronate sodium; sertraline; fosamax  [alendronate sodium]; lipitor [atorvastatin]; pregabalin; tolterodine tartrate; and wasp venom.  MEDICATIONS:  Current Outpatient Prescriptions  Medication Sig Dispense Refill  . ALPRAZolam (XANAX) 0.5 MG tablet Take 0.25 mg by mouth 2 (two) times daily as needed for anxiety.     . calcium citrate-vitamin D (CITRACAL+D) 315-200 MG-UNIT per tablet Take 1 tablet by mouth daily.     . Cholecalciferol (VITAMIN D3) 1000 UNITS CAPS Take 1,000 Units by mouth daily. Take 1 tablet by mouth once daily    . denosumab (PROLIA) 60 MG/ML SOLN Inject 60 mg into the skin every 6 (six) months.     . DEXILANT 60 MG capsule TAKE 1 TABLET BY MOUTH DAILY EVERY MORNING. 30 capsule 6  . EPINEPHrine (EPIPEN JR) 0.15 MG/0.3ML injection  Inject 0.3 mg into the muscle as needed for anaphylaxis.    . ferumoxytol (FERAHEME) 510 MG/17ML SOLN injection Inject 510 mg into the vein See admin instructions. When iron is low. Last dose 06/10/14    . fluorometholone (FML) 0.1 % ophthalmic suspension Place 1 drop into the left eye daily.     . folic acid (FOLVITE) 001 MCG tablet Take 800 mcg by mouth daily.    . furosemide (LASIX) 40 MG tablet Take 40 mg by mouth every other day.     . gabapentin (NEURONTIN) 400 MG tablet Take 400 mg by mouth 3 (three) times daily.     [name] HYDROcodone-acetaminophen (NORCO) 7.5-325 MG tablet Take 1-2 tablets by mouth every 6 hours as needed for pain    . hydroxypropyl methylcellulose / hypromellose (ISOPTO TEARS / GONIOVISC) 2.5 % ophthalmic solution Place 1-2 drops into both eyes 3 (three) times daily as needed for dry eyes.     . verapamil (VERELAN PM) 240 MG 24 hr capsule Take 1 tablet by mouth daily    . zolpidem (AMBIEN) 5 MG tablet Take 5 mg by mouth at bedtime as needed for sleep.     No current facility-administered medications for this visit.      REVIEW OF SYSTEMS:   Constitutional: Denies fevers, [name], nose, mouth, throat, [name], [name], [name], [name], tingling or new weaknesses Behavioral/Psych:  Mood is stable, no new changes  All other systems were reviewed with the patient and are negative.  PHYSICAL EXAMINATION: ECOG PERFORMANCE STATUS: 1 - Symptomatic but completely ambulatory  Vitals:   11/10/15 1448  BP: (!) 116/53  Pulse: 89  Resp: 17  Temp: 97.8 F (36.6 C)   Filed Weights   11/10/15 1448   Weight: 213 lb (96.6 kg)    GENERAL:alert, no distress and comfortable. She looks pale SKIN: skin color, texture, turgor are normal, [name], [name], sclera clear OROPHARYNX:no exudate, no erythema and lips, buccal mucosa, and tongue normal  NECK: supple, thyroid normal size, non-tender, without nodularity LYMPH:  no palpable lymphadenopathy in the cervical, axillary or inguinal LUNGS: clear to auscultation and percussion with normal breathing effort HEART: regular rate & rhythm with loud ejection systolic murmur and no lower extremity edema ABDOMEN:abdomen soft, non-tender and normal bowel sounds Musculoskeletal:no cyanosis of digits and no clubbing  NEURO: alert & oriented x 3 with fluent speech, no focal motor/sensory deficits  LABORATORY DATA:  I have reviewed the data as listed     Component Value Date/Time   NA 134 (L) 08/27/2014 2026   NA 131 (L) 08/24/2013 1219   K 4.5 08/27/2014 2026   K 4.7 08/24/2013 1219   CL 101 08/27/2014 2026   CO2 25 08/27/2014 2026   CO2 24 08/24/2013 1219   GLUCOSE 115 (H) 08/27/2014 2026   GLUCOSE 88 08/24/2013 1219   BUN 23 (H) 08/27/2014 2026   BUN 15.8 08/24/2013 1219   CREATININE 1.02 (H) 08/27/2014 2026   CREATININE 1.1 08/24/2013 1219   CALCIUM 9.1 08/27/2014 2026   CALCIUM 8.9 08/24/2013 1219   PROT 6.3 (L) 08/27/2014 2026   PROT 6.5 08/24/2013 1219   ALBUMIN 3.7 08/27/2014 2026   ALBUMIN 3.7 08/24/2013 1219   AST 17 08/27/2014 2026   AST 15 08/24/2013 1219   ALT 13 (L) 08/27/2014 2026   ALT 8 08/24/2013 1219   ALKPHOS 51 08/27/2014 2026   ALKPHOS 47 08/24/2013 1219   BILITOT 0.2 (L) 08/27/2014 2026   BILITOT 0.23 08/24/2013 1219   GFRNONAA 49 (L) 08/27/2014 2026   GFRAA 57 (L) 08/27/2014 2026    No results found for: SPEP, UPEP  Lab Results  Component Value Date   WBC 6.0 11/10/2015   NEUTROABS 4.2 11/10/2015   HGB 10.3 (L) 11/10/2015   HCT 32.4  (L) 11/10/2015   MCV 99.7 11/10/2015   PLT 221 11/10/2015      Chemistry      Component Value Date/Time   NA 134 (L) 08/27/2014 2026   NA 131 (L) 08/24/2013 1219   K 4.5 08/27/2014 2026   K 4.7 08/24/2013 1219   CL 101 08/27/2014 2026   CO2 25 08/27/2014 2026   CO2 24 08/24/2013 1219   BUN 23 (H) 08/27/2014 2026   BUN 15.8 08/24/2013 1219   CREATININE 1.02 (H) 08/27/2014 2026   CREATININE 1.1 08/24/2013 1219      Component Value Date/Time   CALCIUM 9.1 08/27/2014 2026   CALCIUM 8.9 08/24/2013 1219   ALKPHOS 51 08/27/2014 2026   ALKPHOS 47 08/24/2013 1219   AST 17 08/27/2014 2026   AST 15 08/24/2013 1219   ALT 13 (L) 08/27/2014 2026   ALT 8 08/24/2013 1219   BILITOT 0.2 (L) 08/27/2014 2026   BILITOT 0.23 08/24/2013 1219     ASSESSMENT & PLAN:  Iron deficiency anemia due  to chronic blood loss The primary suspect is severe GI bleed from underlying GAVE. She has received numerous iron infusions. She will continue to come back here on a regular basis for blood check and intermittent iron infusion. Goal is to keep giving her iron infusion until she is no longer anemic or her GI bleed stops. I will proceed with iron infusion today. In the meantime, she will continue supplementation with folic acid and vitamin B 12 by mouth. Says she has stability of her blood count, I will space out her appointment to every 8 weeks. The most likely cause of her anemia is due to chronic blood loss/malabsorption syndrome. We discussed some of the risks, benefits, and alternatives of intravenous iron infusions. The patient is symptomatic from anemia and the iron level is critically low. She tolerated oral iron supplement poorly and desires to achieved higher levels of iron faster for adequate hematopoesis. Some of the side-effects to be expected including risks of infusion reactions, phlebitis, headaches, nausea and fatigue.  The patient is willing to proceed. Patient education material was  dispensed.  Goal is to keep ferritin level greater than 100  Aortic stenosis There is associated between aortic stenosis and GI bleed. 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 Given her age, surgery is probably not recommended.  Continue medical management    All questions were answered. The patient knows to call the clinic with any problems, questions or concerns. No barriers to learning was detected.  I spent 15 minutes counseling the patient face to face. The total time spent in the appointment was 20 minutes and more than 50% was on counseling.     [name], MD 10/5/20173:10 PM

## 2015-11-10 NOTE — Assessment & Plan Note (Signed)
The primary suspect is severe GI bleed from underlying GAVE. She has received numerous iron infusions. She will continue to come back here on a regular basis for blood check and intermittent iron infusion. Goal is to keep giving her iron infusion until she is no longer anemic or her GI bleed stops. I will proceed with iron infusion today. In the meantime, she will continue supplementation with folic acid and vitamin B 12 by mouth. Says she has stability of her blood count, I will space out her appointment to every 8 weeks. The most likely cause of her anemia is due to chronic blood loss/malabsorption syndrome. We discussed some of the risks, benefits, and alternatives of intravenous iron infusions. The patient is symptomatic from anemia and the iron level is critically low. She tolerated oral iron supplement poorly and desires to achieved higher levels of iron faster for adequate hematopoesis. Some of the side-effects to be expected including risks of infusion reactions, phlebitis, headaches, nausea and fatigue.  The patient is willing to proceed. Patient education material was dispensed.  Goal is to keep ferritin level greater than 100

## 2015-11-10 NOTE — Patient Instructions (Signed)

## 2015-11-10 NOTE — Assessment & Plan Note (Signed)
There is associated between aortic stenosis and GI bleed. 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 Given her age, surgery is probably not recommended.  Continue medical management

## 2015-11-11 ENCOUNTER — Telehealth: Payer: Self-pay | Admitting: *Deleted

## 2015-11-11 NOTE — Telephone Encounter (Signed)
Son called to update pt's med list,  She was started on Sertraline.  Added to medlist.  He also reports pt had a Dexa Scan done on 10/05/15 and asks if My Chart can be updated to reflect that as My Chart tells pt she is due to have this done.   Instructed son to notify Dr. Alvy Bimler on next visit.  I believe she can enter it under preventive care section on visit and then it may "cross over" to My Chart.  He verbalized understanding.

## 2016-01-12 ENCOUNTER — Ambulatory Visit: Payer: Medicare Other

## 2016-01-12 ENCOUNTER — Observation Stay (HOSPITAL_COMMUNITY)
Admission: AD | Admit: 2016-01-12 | Discharge: 2016-01-13 | Disposition: A | Discharge status: Home / Self Care | Payer: Medicare Other | Source: Ambulatory Visit | Attending: Hematology and Oncology | Admitting: Hematology and Oncology

## 2016-01-12 ENCOUNTER — Ambulatory Visit (HOSPITAL_BASED_OUTPATIENT_CLINIC_OR_DEPARTMENT_OTHER): Payer: Medicare Other

## 2016-01-12 ENCOUNTER — Other Ambulatory Visit (HOSPITAL_BASED_OUTPATIENT_CLINIC_OR_DEPARTMENT_OTHER): Payer: Medicare Other

## 2016-01-12 ENCOUNTER — Encounter: Payer: Medicare Other | Admitting: Hematology and Oncology

## 2016-01-12 ENCOUNTER — Other Ambulatory Visit: Payer: Self-pay | Admitting: Hematology and Oncology

## 2016-01-12 ENCOUNTER — Ambulatory Visit (HOSPITAL_COMMUNITY)
Admission: RE | Admit: 2016-01-12 | Discharge: 2016-01-12 | Disposition: A | Discharge status: Home / Self Care | Payer: Medicare Other | Source: Ambulatory Visit | Attending: Hematology and Oncology | Admitting: Hematology and Oncology

## 2016-01-12 VITALS — BP 73/40 | HR 52 | Temp 97.9°F | Resp 18 | Ht 62.0 in

## 2016-01-12 DIAGNOSIS — I509 Heart failure, unspecified: Secondary | ICD-10-CM | POA: Insufficient documentation

## 2016-01-12 DIAGNOSIS — K922 Gastrointestinal hemorrhage, unspecified: Secondary | ICD-10-CM | POA: Diagnosis not present

## 2016-01-12 DIAGNOSIS — I951 Orthostatic hypotension: Secondary | ICD-10-CM

## 2016-01-12 DIAGNOSIS — D500 Iron deficiency anemia secondary to blood loss (chronic): Principal | ICD-10-CM | POA: Diagnosis present

## 2016-01-12 DIAGNOSIS — Z79899 Other long term (current) drug therapy: Secondary | ICD-10-CM | POA: Diagnosis not present

## 2016-01-12 DIAGNOSIS — I110 Hypertensive heart disease with heart failure: Secondary | ICD-10-CM | POA: Diagnosis not present

## 2016-01-12 DIAGNOSIS — Z87891 Personal history of nicotine dependence: Secondary | ICD-10-CM | POA: Diagnosis not present

## 2016-01-12 DIAGNOSIS — K31819 Angiodysplasia of stomach and duodenum without bleeding: Secondary | ICD-10-CM

## 2016-01-12 DIAGNOSIS — D508 Other iron deficiency anemias: Secondary | ICD-10-CM

## 2016-01-12 LAB — CBC & DIFF AND RETIC
BASO%: 0.9 % (ref 0.0–2.0)
Basophils Absolute: 0.1 10*3/uL (ref 0.0–0.1)
EOS%: 0.7 % (ref 0.0–7.0)
Eosinophils Absolute: 0 10*3/uL (ref 0.0–0.5)
HCT: 21.5 % — ABNORMAL LOW (ref 34.8–46.6)
HGB: 6.7 g/dL — CL (ref 11.6–15.9)
Immature Retic Fract: 19.9 % — ABNORMAL HIGH (ref 1.60–10.00)
LYMPH%: 7.9 % — ABNORMAL LOW (ref 14.0–49.7)
MCH: 29.3 pg (ref 25.1–34.0)
MCHC: 31.1 g/dL — ABNORMAL LOW (ref 31.5–36.0)
MCV: 94.2 fL (ref 79.5–101.0)
MONO#: 0.4 10*3/uL (ref 0.1–0.9)
MONO%: 6.8 % (ref 0.0–14.0)
NEUT#: 5.5 10*3/uL (ref 1.5–6.5)
NEUT%: 83.7 % — ABNORMAL HIGH (ref 38.4–76.8)
Platelets: 287 10*3/uL (ref 145–400)
RBC: 2.28 10*6/uL — ABNORMAL LOW (ref 3.70–5.45)
RDW: 14.5 % (ref 11.2–14.5)
Retic %: 4.61 % — ABNORMAL HIGH (ref 0.70–2.10)
Retic Ct Abs: 105.11 10*3/uL — ABNORMAL HIGH (ref 33.70–90.70)
WBC: 6.6 10*3/uL (ref 3.9–10.3)
lymph#: 0.5 10*3/uL — ABNORMAL LOW (ref 0.9–3.3)

## 2016-01-12 LAB — FERRITIN: Ferritin: 23 ng/ml (ref 9–269)

## 2016-01-12 LAB — PREPARE RBC (CROSSMATCH)

## 2016-01-12 MED ORDER — ONDANSETRON 4 MG PO TBDP: 4.0000 mg | ORAL_TABLET | Freq: Three times a day (TID) | ORAL | Status: DC | PRN

## 2016-01-12 MED ORDER — SODIUM CHLORIDE 0.9 % IV SOLN
8.0000 mg | Freq: Three times a day (TID) | INTRAVENOUS | Status: DC | PRN
  Filled 2016-01-12: qty 4

## 2016-01-12 MED ORDER — ENOXAPARIN SODIUM 40 MG/0.4ML ~~LOC~~ SOLN: 40.0000 mg | SUBCUTANEOUS | Status: DC

## 2016-01-12 MED ORDER — DIPHENHYDRAMINE HCL 25 MG PO CAPS
25.0000 mg | ORAL_CAPSULE | Freq: Once | ORAL | Status: AC
  Administered 2016-01-12: 25 mg via ORAL
  Filled 2016-01-12: qty 1

## 2016-01-12 MED ORDER — FLUOROMETHOLONE 0.1 % OP SUSP
1.0000 [drp] | Freq: Every day | OPHTHALMIC | Status: DC
  Filled 2016-01-12: qty 5

## 2016-01-12 MED ORDER — SODIUM CHLORIDE 0.9% FLUSH: 10.0000 mL | INTRAVENOUS | Status: DC | PRN

## 2016-01-12 MED ORDER — GABAPENTIN 400 MG PO CAPS
400.0000 mg | ORAL_CAPSULE | Freq: Three times a day (TID) | ORAL | Status: DC
  Administered 2016-01-12 (×2): 400 mg via ORAL
  Filled 2016-01-12 (×3): qty 1

## 2016-01-12 MED ORDER — ZOLPIDEM TARTRATE 5 MG PO TABS: 5.0000 mg | ORAL_TABLET | Freq: Every evening | ORAL | Status: DC | PRN

## 2016-01-12 MED ORDER — ACETAMINOPHEN 325 MG PO TABS: 650.0000 mg | ORAL_TABLET | ORAL | Status: DC | PRN

## 2016-01-12 MED ORDER — ONDANSETRON HCL 4 MG PO TABS: 4.0000 mg | ORAL_TABLET | Freq: Three times a day (TID) | ORAL | Status: DC | PRN

## 2016-01-12 MED ORDER — SENNOSIDES-DOCUSATE SODIUM 8.6-50 MG PO TABS: 1.0000 | ORAL_TABLET | Freq: Every evening | ORAL | Status: DC | PRN

## 2016-01-12 MED ORDER — VITAMIN D3 25 MCG (1000 UNIT) PO TABS
1000.0000 [IU] | ORAL_TABLET | Freq: Every day | ORAL | Status: DC
  Filled 2016-01-12: qty 1

## 2016-01-12 MED ORDER — SERTRALINE HCL 50 MG PO TABS
50.0000 mg | ORAL_TABLET | Freq: Every day | ORAL | Status: DC
  Filled 2016-01-12: qty 1

## 2016-01-12 MED ORDER — SODIUM CHLORIDE 0.9 % IV SOLN
250.0000 mL | Freq: Once | INTRAVENOUS | Status: AC
  Administered 2016-01-12: 250 mL via INTRAVENOUS

## 2016-01-12 MED ORDER — FOLIC ACID 1 MG PO TABS
1000.0000 ug | ORAL_TABLET | Freq: Every day | ORAL | Status: DC
  Filled 2016-01-12: qty 1

## 2016-01-12 MED ORDER — ONDANSETRON HCL 4 MG/2ML IJ SOLN: 4.0000 mg | Freq: Three times a day (TID) | INTRAMUSCULAR | Status: DC | PRN

## 2016-01-12 MED ORDER — ENOXAPARIN SODIUM 30 MG/0.3ML ~~LOC~~ SOLN
30.0000 mg | SUBCUTANEOUS | Status: DC
  Administered 2016-01-12: 30 mg via SUBCUTANEOUS
  Filled 2016-01-12: qty 0.3

## 2016-01-12 MED ORDER — HEPARIN SOD (PORK) LOCK FLUSH 100 UNIT/ML IV SOLN: 250.0000 [IU] | INTRAVENOUS | Status: DC | PRN

## 2016-01-12 MED ORDER — ALPRAZOLAM 0.25 MG PO TABS
0.2500 mg | ORAL_TABLET | Freq: Two times a day (BID) | ORAL | Status: DC | PRN
  Administered 2016-01-12: 0.25 mg via ORAL
  Filled 2016-01-12: qty 1

## 2016-01-12 MED ORDER — PANTOPRAZOLE SODIUM 40 MG PO TBEC
40.0000 mg | DELAYED_RELEASE_TABLET | Freq: Every day | ORAL | Status: DC
  Administered 2016-01-12: 40 mg via ORAL
  Filled 2016-01-12 (×2): qty 1

## 2016-01-12 MED ORDER — CALCIUM CARBONATE-VITAMIN D 500-200 MG-UNIT PO TABS
1.0000 | ORAL_TABLET | Freq: Every day | ORAL | Status: DC
  Filled 2016-01-12: qty 1

## 2016-01-12 MED ORDER — ACETAMINOPHEN 325 MG PO TABS
650.0000 mg | ORAL_TABLET | Freq: Once | ORAL | Status: DC
Start: 2016-01-12 — End: 2016-01-13

## 2016-01-12 MED ORDER — SODIUM CHLORIDE 0.9 % IV SOLN
510.0000 mg | Freq: Once | INTRAVENOUS | Status: AC
  Administered 2016-01-13: 510 mg via INTRAVENOUS
  Filled 2016-01-12: qty 17

## 2016-01-12 MED ORDER — HYPROMELLOSE (GONIOSCOPIC) 2.5 % OP SOLN
1.0000 [drp] | Freq: Three times a day (TID) | OPHTHALMIC | Status: DC | PRN
  Filled 2016-01-12: qty 15

## 2016-01-12 MED ORDER — HEPARIN SOD (PORK) LOCK FLUSH 100 UNIT/ML IV SOLN: 500.0000 [IU] | Freq: Every day | INTRAVENOUS | Status: DC | PRN

## 2016-01-12 MED ORDER — SODIUM CHLORIDE 0.9% FLUSH: 3.0000 mL | INTRAVENOUS | Status: DC | PRN

## 2016-01-12 MED ORDER — SODIUM CHLORIDE 0.9 % IV SOLN: Freq: Once | INTRAVENOUS | Status: DC

## 2016-01-12 NOTE — Progress Notes (Signed)
This encounter was created in error - please disregard.

## 2016-01-12 NOTE — H&P (Signed)
[facility] ADMISSION NOTE  Patient Care Team: [name], MD as PCP - General (Internal Medicine)  CHIEF COMPLAINTS/PURPOSE OF ADMISSION Severe anemia, symptomatic with significant hypotension  HISTORY OF PRESENTING ILLNESS:  Sophia [name] 82 y.o. female is admitted for management of severe iron deficiency anemia from chronic GI bleed Summary of hematologic history as follows: She was found to have abnormal CBC from recent blood count monitoring. The patient have chronic iron deficiency anemia due to recurrent GI bleed. 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 On 09/30/2013, bone marrow biopsy was done which showed no evidence to suggest myelodysplastic syndrome From July 2015 to present, she has received numerous intravenous iron infusion On 07/08/2014, repeat endoscopy revealed multiple AVMs and GAVE and she have repeat ablation therapy  The patient sees me every other month with frequent blood work monitoring due to known high risk GI bleed. Her last intravenous iron infusion was around 11/10/2015. The patient denies any recent signs or symptoms of bleeding such as spontaneous epistaxis, hematuria or hematochezia. She complained of profound weakness. Denies recent chest pain or shortness of breath. In the outpatient office, she was noted to have profound hypotension. Her blood pressure was only 73/40. She was hence admitted to the hospital for urgent blood transfusion due to severe anemia.  MEDICAL HISTORY:  Past Medical History:  Diagnosis Date  . Anemia   . Anxiety   . Aortic stenosis   . Arthritis   . Blood transfusion   . Cataracts, bilateral    removed  . Congestive heart failure ([place])   . Depression   . Diverticulosis 03/14/10  . Duodenitis 03/15/10   per capsule endoscopy report  . DVT (deep venous thrombosis) ([place])    pt denies  . Dyslipidemia   . GAVE (gastric antral vascular ectasia)   . GERD  (gastroesophageal reflux disease)   . Heart murmur   . Hemorrhagic gastritis 03/15/10   per capsule endoscopy report  . Hiatal hernia   . Hyperlipidemia   . Hyperplastic colon polyp   . Hypertension   . Neuropathy ([facility])    bil big toes  . Osteoporosis   . Presbyesophagus   . Unspecified deficiency anemia 08/24/2013   Iron infusion     SURGICAL HISTORY: Past Surgical History:  Procedure Laterality Date  . CATARACT EXTRACTION, BILATERAL    . CHOLECYSTECTOMY    . ENTEROSCOPY N/A 07/08/2014   Procedure: ENTEROSCOPY;  Surgeon: Lafayette Dragon, MD;  Location: [facility] END[facility]COPY;  Service: Endoscopy;  Laterality: N/A;  . ESOPHAGOGASTRODUODENOSCOPY N/A 04/28/2012   Procedure: ESOPHAGOGASTRODUODENOSCOPY (EGD);  Surgeon: Lafayette Dragon, MD;  Location: [facility];  Service: Endoscopy;  Laterality: N/A;  . ESOPHAGOGASTRODUODENOSCOPY N/A 04/12/2014   Procedure: ESOPHAGOGASTRODUODENOSCOPY (EGD);  Surgeon: Lafayette Dragon, MD;  Location: [facility];  Service: Endoscopy;  Laterality: N/A;  . FOOT SURGERY  1998  . fracture left knee  1993   x 2   . HOT HEMOSTASIS N/A 04/28/2012   Procedure: HOT HEMOSTASIS (ARGON PLASMA COAGULATION/BICAP);  Surgeon: Lafayette Dragon, MD;  Location: [facility];  Service: Endoscopy;  Laterality: N/A;  . HOT HEMOSTASIS N/A 04/12/2014   Procedure: HOT HEMOSTASIS (ARGON PLASMA COAGULATION/BICAP);  Surgeon: Lafayette Dragon, MD;  Location: [facility];  Service: Endoscopy;  Laterality: N/A;  . HOT HEMOSTASIS N/A 07/08/2014   Procedure: HOT HEMOSTASIS (ARGON PLASMA COAGULATION/BICAP);  Surgeon: Lafayette Dragon, MD;  Location: [facility];  Service: Endoscopy;  Laterality: N/A;  .  left eye cornea  surgery  yrs ago  . ORIF WRIST FRACTURE Right   . plantar fasciitis repair     left  . TUBAL LIGATION      SOCIAL HISTORY: Social History   Social History  . Marital status: Widowed    Spouse name: N/A  . Number of children: N/A  . Years of education: N/A   Occupational History  .  retired    Social History Main Topics  . Smoking status: Former Smoker    Packs/day: 0.50    Years: 20.00    Types: Cigarettes    Quit date: 01/08/1989  . Smokeless tobacco: Never Used  . Alcohol use No  . Drug use: No  . Sexual activity: No   Other Topics Concern  . Not on file   Social History Narrative  . No narrative on file    FAMILY HISTORY: Family History  Problem Relation Age of Onset  . Prostate cancer Father   . Uterine cancer Sister   . Kidney cancer Brother   . Kidney cancer Son   . Colon cancer Sister     mets from uterine cancer    ALLERGIES:  is allergic to nutritional supplements; risedronate sodium; sertraline; fosamax  [alendronate sodium]; lipitor [atorvastatin]; pregabalin; tolterodine tartrate; and wasp venom.  MEDICATIONS:  Current Facility-Administered Medications  Medication Dose Route Frequency Provider Last Rate Last Dose  . 0.9 %  sodium chloride infusion   Intravenous Once [name], MD      . acetaminophen (TYLENOL) tablet 650 mg  650 mg Oral Q4H PRN [name], MD      . acetaminophen (TYLENOL) tablet 650 mg  650 mg Oral Once [name], MD      . ALPRAZolam Duanne Moron) tablet 0.25 mg  0.25 mg Oral BID PRN [name], MD      . calcium citrate-vitamin D (CITRACAL+D) 315-200 MG-UNIT per tablet 1 tablet  1 tablet Oral Daily [name], MD      . diphenhydrAMINE (BENADRYL) capsule 25 mg  25 mg Oral Once [name], MD      . enoxaparin (LOVENOX) injection 30 mg  30 mg Subcutaneous Q24H Scheryl Sanborn, MD      . fluorometholone (FML) 0.1 % ophthalmic suspension 1 drop  1 drop Left Eye Daily [name], MD      . folic acid (FOLVITE) tablet 800 mcg  800 mcg Oral Daily Cassady Stanczak, MD      . gabapentin (NEURONTIN) tablet 400 mg  400 mg Oral TID [name], MD      . hydroxypropyl methylcellulose / hypromellose (ISOPTO TEARS / GONIOVISC) 2.5 % ophthalmic solution 1-2 drop  1-2 drop Both Eyes TID PRN [name], MD      . ondansetron (ZOFRAN) tablet 4-8 mg   4-8 mg Oral Q8H PRN [name], MD       Or  . ondansetron (ZOFRAN-ODT) disintegrating tablet 4-8 mg  4-8 mg Oral Q8H PRN [name], MD       Or  . ondansetron (ZOFRAN) injection 4 mg  4 mg Intravenous Q8H PRN Cochise Dinneen, MD       Or  . ondansetron (ZOFRAN) 8 mg in sodium chloride 0.9 % 50 mL IVPB  8 mg Intravenous Q8H PRN Marrissa Dai, MD      . pantoprazole (PROTONIX) EC tablet 40 mg  40 mg Oral Daily Clotee Schlicker, MD      . senna-docusate (Senokot-S) tablet 1 tablet  1 tablet  Oral QHS PRN [name], MD      . sertraline (ZOLOFT) tablet 50 mg  50 mg Oral Daily [name], MD      . Vitamin D3 CAPS 1,000 Units  1,000 Units Oral Daily [name], MD      . zolpidem (AMBIEN) tablet 5 mg  5 mg Oral QHS PRN [name], MD        REVIEW OF SYSTEMS:   Constitutional: Denies fevers, chills or abnormal night sweats Eyes: Denies blurriness of vision, double vision or watery eyes Ears, nose, mouth, throat, [name], [name], [name], [name], tingling  Behavioral/Psych: Mood is stable, no new changes  All other systems were reviewed with the patient and are negative.  PHYSICAL EXAMINATION: ECOG PERFORMANCE STATUS: 2 - Symptomatic, <50% confined to bed T: 97.9 BP 73/40 HR 90 RR 18 GENERAL:alert, no distress and comfortable. She is sitting on the wheelchair SKIN: She looks pale EYES: normal, conjunctiva are pale and non-injected, sclera clear OROPHARYNX:no exudate, no erythema and lips, buccal mucosa, and tongue normal  NECK: supple, thyroid normal size, non-tender, without nodularity LYMPH:  no palpable lymphadenopathy in the cervical, axillary or inguinal LUNGS: clear to  auscultation and percussion with normal breathing effort HEART: regular rate & rhythm with left ejection systolic murmur and no lower extremity edema ABDOMEN:abdomen soft, non-tender and normal bowel sounds Musculoskeletal:no cyanosis of digits and no clubbing  PSYCH: alert & oriented x 3 with fluent speech NEURO: no focal motor/sensory deficits  LABORATORY DATA:  I have reviewed the data as listed Lab Results  Component Value Date   WBC 6.6 01/12/2016   HGB 6.7 (LL) 01/12/2016   HCT 21.5 (L) 01/12/2016   MCV 94.2 01/12/2016   PLT 287 01/12/2016   No results for input(s): NA, K, CL, CO2, GLUCOSE, BUN, CREATININE, CALCIUM, GFRNONAA, GFRAA, PROT, ALBUMIN, AST, ALT, ALKPHOS, BILITOT, BILIDIR, IBILI in the last 8760 hours.  ASSESSMENT & PLAN:   Recurrent severe iron deficiency anemia Due to critical value and profound symptomatic anemia, I will admit her to the hospital for 2 units of blood transfusion tonight followed by intravenous iron tomorrow. I plan to recheck her blood count closely. If she has persistent anemia, will consult GI for repeat upper GI workup.  Significant hypotension Due to hypovolemia I will hold her blood pressure medications today  History upper GI bleed Continue proton pump inhibitor  DVT prophylaxis On Lovenox  CODE STATUS Full code  Discharge planning Hopefully discharged tomorrow after intravenous iron treatment.  All questions were answered. The patient knows to call the clinic with any problems, questions or concerns. I spent 40 minutes counseling the patient face to face. The total time spent in the appointment was 60 minutes and more than 50% was on counseling.     [name], MD 01/12/2016 4:54 PM

## 2016-01-13 ENCOUNTER — Encounter (HOSPITAL_COMMUNITY): Payer: Medicare Other

## 2016-01-13 DIAGNOSIS — I110 Hypertensive heart disease with heart failure: Secondary | ICD-10-CM | POA: Diagnosis not present

## 2016-01-13 DIAGNOSIS — I951 Orthostatic hypotension: Secondary | ICD-10-CM | POA: Diagnosis not present

## 2016-01-13 DIAGNOSIS — Z79899 Other long term (current) drug therapy: Secondary | ICD-10-CM | POA: Diagnosis not present

## 2016-01-13 DIAGNOSIS — I509 Heart failure, unspecified: Secondary | ICD-10-CM | POA: Diagnosis not present

## 2016-01-13 DIAGNOSIS — K922 Gastrointestinal hemorrhage, unspecified: Secondary | ICD-10-CM | POA: Diagnosis not present

## 2016-01-13 DIAGNOSIS — Z87891 Personal history of nicotine dependence: Secondary | ICD-10-CM | POA: Diagnosis not present

## 2016-01-13 DIAGNOSIS — D500 Iron deficiency anemia secondary to blood loss (chronic): Secondary | ICD-10-CM | POA: Diagnosis not present

## 2016-01-13 LAB — CBC WITH DIFFERENTIAL/PLATELET
BASOS PCT: 0 %
Basophils Absolute: 0 10*3/uL (ref 0.0–0.1)
EOS ABS: 0.1 10*3/uL (ref 0.0–0.7)
EOS PCT: 1 %
HCT: 26.3 % — ABNORMAL LOW (ref 36.0–46.0)
Hemoglobin: 8.3 g/dL — ABNORMAL LOW (ref 12.0–15.0)
LYMPHS ABS: 0.8 10*3/uL (ref 0.7–4.0)
Lymphocytes Relative: 15 %
MCH: 29 pg (ref 26.0–34.0)
MCHC: 31.6 g/dL (ref 30.0–36.0)
MCV: 92 fL (ref 78.0–100.0)
MONOS PCT: 9 %
Monocytes Absolute: 0.5 10*3/uL (ref 0.1–1.0)
Neutro Abs: 4.3 10*3/uL (ref 1.7–7.7)
Neutrophils Relative %: 75 %
PLATELETS: 243 10*3/uL (ref 150–400)
RBC: 2.86 MIL/uL — ABNORMAL LOW (ref 3.87–5.11)
RDW: 16.7 % — AB (ref 11.5–15.5)
WBC: 5.7 10*3/uL (ref 4.0–10.5)

## 2016-01-13 LAB — COMPREHENSIVE METABOLIC PANEL
ALK PHOS: 51 U/L (ref 38–126)
ALT: 8 U/L — AB (ref 14–54)
AST: 13 U/L — AB (ref 15–41)
Albumin: 3.6 g/dL (ref 3.5–5.0)
Anion gap: 7 (ref 5–15)
BUN: 41 mg/dL — AB (ref 6–20)
CALCIUM: 9.1 mg/dL (ref 8.9–10.3)
CHLORIDE: 107 mmol/L (ref 101–111)
CO2: 27 mmol/L (ref 22–32)
CREATININE: 1.28 mg/dL — AB (ref 0.44–1.00)
GFR calc Af Amer: 43 mL/min — ABNORMAL LOW (ref >60)
GFR, EST NON AFRICAN AMERICAN: 37 mL/min — AB (ref >60)
Glucose, Bld: 105 mg/dL — ABNORMAL HIGH (ref 65–99)
Potassium: 4.4 mmol/L (ref 3.5–5.1)
SODIUM: 141 mmol/L (ref 135–145)
Total Bilirubin: 0.6 mg/dL (ref 0.3–1.2)
Total Protein: 6.1 g/dL — ABNORMAL LOW (ref 6.5–8.1)

## 2016-01-13 NOTE — Discharge Summary (Signed)
Physician Discharge Summary  Patient ID: Sophia [name] MRN: [id number] DOB/AGE: May 06, 1928 82 y.o.  Admit date: 01/12/2016 Discharge date: 01/13/2016  Primary Care Physician:  Horatio Pel, MD   Discharge Diagnoses:    Present on Admission: . Iron deficiency anemia due to chronic blood loss   Discharge Medications: see discharge orders  Disposition and Follow-up: on 12/20: patient will be notified  Significant Diagnostic Studies:  No results found.  Discharge Laboratory Values: Lab Results  Component Value Date   WBC 5.7 01/13/2016   HGB 8.3 (L) 01/13/2016   HCT 26.3 (L) 01/13/2016   MCV 92.0 01/13/2016   PLT 243 01/13/2016   Lab Results  Component Value Date   NA 141 01/13/2016   K 4.4 01/13/2016   CL 107 01/13/2016   CO2 27 01/13/2016    Brief H and P: For complete details please refer to admission H and P, but in brief, The patient was admitted to the hospital yesterday due to presentation with severe symptomatic anemia with clinical hypotension. She received 2 units of blood transfusion along with intravenous iron with symptomatic improvement.  Physical Exam at Discharge: BP 136/67   Pulse 89   Temp 98.3 F (36.8 C) (Oral)   Resp 16   SpO2 95%  GENERAL:alert, [name], texture, turgor are normal, [name], [name], sclera clear OROPHARYNX:no exudate, no erythema and lips, buccal mucosa, and tongue normal  NECK: supple, thyroid normal size, non-tender, without nodularity LYMPH:  no palpable lymphadenopathy in the cervical, axillary or inguinal LUNGS: clear to auscultation and percussion with normal breathing effort HEART: regular rate & rhythm and no murmurs and no lower extremity edema ABDOMEN:abdomen soft, non-tender and normal bowel sounds Musculoskeletal:no cyanosis of digits and no clubbing  NEURO: alert & oriented x 3 with  fluent speech, no focal motor/sensory deficits  Hospital Course:  Active Problems:   Iron deficiency anemia due to chronic blood loss   Orthostatic hypotension   Diet:  Regular  Activity:  As tolerated  Condition at Discharge:   stable  Signed: Dr. [name] [phone]  01/13/2016, 8:30 AM

## 2016-01-16 ENCOUNTER — Telehealth: Payer: Self-pay | Admitting: Hematology and Oncology

## 2016-01-16 LAB — TYPE AND SCREEN
BLOOD PRODUCT EXPIRATION DATE: 201712252359
Blood Product Expiration Date: 201712212359
ISSUE DATE / TIME: 201712072123
ISSUE DATE / TIME: 201712080035
UNIT TYPE AND RH: 5100
Unit Type and Rh: 5100

## 2016-01-16 NOTE — Telephone Encounter (Signed)
Appointments scheduled per in-basket message/ 12/7 LOS. Patient's son called to ensure appointments were scheduled for the patient.

## 2016-01-17 ENCOUNTER — Telehealth: Payer: Self-pay | Admitting: *Deleted

## 2016-01-17 NOTE — Telephone Encounter (Signed)
Per staff message I have scheduled appt and notified the scheduler 

## 2016-01-25 ENCOUNTER — Ambulatory Visit (HOSPITAL_BASED_OUTPATIENT_CLINIC_OR_DEPARTMENT_OTHER): Payer: Medicare Other

## 2016-01-25 ENCOUNTER — Other Ambulatory Visit (HOSPITAL_BASED_OUTPATIENT_CLINIC_OR_DEPARTMENT_OTHER): Payer: Medicare Other

## 2016-01-25 ENCOUNTER — Other Ambulatory Visit: Payer: Self-pay | Admitting: *Deleted

## 2016-01-25 ENCOUNTER — Ambulatory Visit (HOSPITAL_BASED_OUTPATIENT_CLINIC_OR_DEPARTMENT_OTHER): Payer: Medicare Other | Admitting: Hematology and Oncology

## 2016-01-25 ENCOUNTER — Telehealth: Payer: Self-pay | Admitting: Hematology and Oncology

## 2016-01-25 VITALS — BP 116/43 | HR 76 | Temp 98.0°F | Resp 18 | Ht 62.0 in | Wt 209.4 lb

## 2016-01-25 VITALS — BP 131/42 | HR 75 | Temp 98.2°F | Resp 16

## 2016-01-25 DIAGNOSIS — D72819 Decreased white blood cell count, unspecified: Secondary | ICD-10-CM

## 2016-01-25 DIAGNOSIS — D500 Iron deficiency anemia secondary to blood loss (chronic): Secondary | ICD-10-CM

## 2016-01-25 DIAGNOSIS — D508 Other iron deficiency anemias: Secondary | ICD-10-CM

## 2016-01-25 DIAGNOSIS — K922 Gastrointestinal hemorrhage, unspecified: Secondary | ICD-10-CM | POA: Diagnosis not present

## 2016-01-25 DIAGNOSIS — K31819 Angiodysplasia of stomach and duodenum without bleeding: Secondary | ICD-10-CM

## 2016-01-25 DIAGNOSIS — D539 Nutritional anemia, unspecified: Secondary | ICD-10-CM

## 2016-01-25 DIAGNOSIS — D72818 Other decreased white blood cell count: Secondary | ICD-10-CM

## 2016-01-25 LAB — CBC & DIFF AND RETIC
BASO%: 0 % (ref 0.0–2.0)
Basophils Absolute: 0 10e3/uL (ref 0.0–0.1)
EOS%: 2.4 % (ref 0.0–7.0)
Eosinophils Absolute: 0.1 10e3/uL (ref 0.0–0.5)
HCT: 28.1 % — ABNORMAL LOW (ref 34.8–46.6)
HGB: 8.5 g/dL — ABNORMAL LOW (ref 11.6–15.9)
Immature Retic Fract: 16.2 % — ABNORMAL HIGH (ref 1.60–10.00)
LYMPH%: 12.8 % — ABNORMAL LOW (ref 14.0–49.7)
MCH: 29.6 pg (ref 25.1–34.0)
MCHC: 30.2 g/dL — ABNORMAL LOW (ref 31.5–36.0)
MCV: 97.9 fL (ref 79.5–101.0)
MONO#: 0.4 10e3/uL (ref 0.1–0.9)
MONO%: 9.4 % (ref 0.0–14.0)
NEUT#: 2.9 10e3/uL (ref 1.5–6.5)
NEUT%: 75.4 % (ref 38.4–76.8)
Platelets: 247 10e3/uL (ref 145–400)
RBC: 2.87 10e6/uL — ABNORMAL LOW (ref 3.70–5.45)
RDW: 19 % — ABNORMAL HIGH (ref 11.2–14.5)
Retic %: 3.72 % — ABNORMAL HIGH (ref 0.70–2.10)
Retic Ct Abs: 106.76 10e3/uL — ABNORMAL HIGH (ref 33.70–90.70)
WBC: 3.8 10e3/uL — ABNORMAL LOW (ref 3.9–10.3)
lymph#: 0.5 10e3/uL — ABNORMAL LOW (ref 0.9–3.3)

## 2016-01-25 LAB — FERRITIN: Ferritin: 140 ng/mL (ref 9–269)

## 2016-01-25 MED ORDER — SODIUM CHLORIDE 0.9 % IV SOLN
510.0000 mg | Freq: Once | INTRAVENOUS | Status: AC
  Administered 2016-01-25: 510 mg via INTRAVENOUS
  Filled 2016-01-25: qty 17

## 2016-01-25 MED ORDER — SODIUM CHLORIDE 0.9 % IV SOLN
Freq: Once | INTRAVENOUS | Status: AC
  Administered 2016-01-25: 13:00:00 via INTRAVENOUS

## 2016-01-25 NOTE — Progress Notes (Signed)
Per Erline Levine, Dr. Calton Dach nurse, patient to receive no blood only IV iron per MD.

## 2016-01-25 NOTE — Patient Instructions (Signed)

## 2016-01-25 NOTE — Telephone Encounter (Signed)
Gave relative avs report and appointments for January and February.  °

## 2016-01-26 ENCOUNTER — Encounter: Payer: Self-pay | Admitting: Hematology and Oncology

## 2016-01-26 NOTE — Assessment & Plan Note (Signed)
The primary suspect is severe GI bleed from underlying GAVE. She has received numerous iron infusions. She will continue to come back here on a regular basis for blood check and intermittent iron infusion. Goal is to keep giving her iron infusion until she is no longer anemic or her GI bleed stops. I will proceed with iron infusion today. In the meantime, she will continue supplementation with folic acid and vitamin B 12 by mouth. The most likely cause of her anemia is due to chronic blood loss/malabsorption syndrome. We discussed some of the risks, benefits, and alternatives of intravenous iron infusions. The patient is symptomatic from anemia and the iron level is critically low. She tolerated oral iron supplement poorly and desires to achieved higher levels of iron faster for adequate hematopoesis. Some of the side-effects to be expected including risks of infusion reactions, phlebitis, headaches, nausea and fatigue.  The patient is willing to proceed. Patient education material was dispensed.  Goal is to keep ferritin level greater than 100

## 2016-01-26 NOTE — Progress Notes (Signed)
[facility] OFFICE PROGRESS NOTE  Horatio Pel, MD SUMMARY OF HEMATOLOGIC HISTORY:  She was found to have abnormal CBC from recent blood count monitoring. The patient have chronic iron deficiency anemia due to recurrent GI bleed. 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 She received blood transfusion and iron treatment INTERVAL HISTORY: Sophia [name] 82 y.o. female returns for further follow-up. Her energy level is fair. She denies recent chest pain or shortness of breath. The patient denies any recent signs or symptoms of bleeding such as spontaneous epistaxis, hematuria or hematochezia.   I have reviewed the past medical history, past surgical history, [name]; risedronate sodium; sertraline; fosamax [alendronate sodium]; lipitor [atorvastatin]; pregabalin; tolterodine tartrate; and wasp venom.  MEDICATIONS:  Current Outpatient Prescriptions  Medication Sig Dispense Refill  . ALPRAZolam (XANAX) 0.25 MG tablet Take 0.25 mg by mouth 2 (two) times daily as needed for anxiety.    . cholecalciferol (VITAMIN D) 1000 units tablet Take 2,000 Units by mouth daily.    [name] denosumab (PROLIA) 60 MG/ML SOLN Inject 60 mg into the skin every 6 (six) months.     . dexlansoprazole (DEXILANT) 60 MG capsule Take 60 mg by mouth daily.    [name] EPINEPHrine (EPIPEN 2-PAK) 0.3 mg/0.3 mL IJ SOAJ injection Inject 0.3 mg into the muscle once as needed (for  severe allergic reaction).    . ferumoxytol (FERAHEME) 510 MG/17ML SOLN injection Inject 510 mg into the vein as needed (for low iron).     . fluorometholone (FML) 0.1 % ophthalmic suspension Place 1 drop into the left eye daily.     . folic acid (FOLVITE) 115 MCG tablet Take 800 mcg by mouth daily.    [name] gabapentin (NEURONTIN) 400 MG capsule Take 400 mg by mouth 3 (three) times daily.    [name] HYDROcodone-acetaminophen (NORCO) 7.5-325 MG tablet Take 1-2 tablets by mouth every 6 (six) hours as needed for moderate pain.     . hydroxypropyl methylcellulose / hypromellose (ISOPTO TEARS / GONIOVISC) 2.5 % ophthalmic solution Place 1-2 drops into the right eye as needed for dry eyes.     [name] sertraline (ZOLOFT) 50 MG tablet Take 50 mg by mouth daily.    . verapamil (CALAN-SR) 240 MG CR tablet Take 240 mg by mouth daily.    [name] zolpidem (AMBIEN) 5 MG tablet Take 5 mg by mouth at bedtime as needed for sleep.     No current facility-administered medications for this visit.      REVIEW OF SYSTEMS:   Constitutional: Denies fevers, [name], nose, mouth, throat, [name], [name], [name], [name], [name], no new changes  All other systems were reviewed with the patient and are  negative.  PHYSICAL EXAMINATION: ECOG PERFORMANCE STATUS: 2 - Symptomatic, <50% confined to bed  Vitals:   01/25/16 1140  BP: (!) 116/43  Pulse: 76  Resp: 18  Temp: 98 F (36.7 C)   Filed Weights   01/25/16 1140  Weight: 209 lb 6.4 oz (95 kg)    GENERAL:alert, no distress and comfortable SKIN: skin  color, texture, turgor are normal, [name], [name], [name], no focal motor/sensory deficits  LABORATORY DATA:  I have reviewed the data as listed     Component Value Date/Time   NA 141 01/13/2016 0345   NA 131 (L) 08/24/2013 1219   K 4.4 01/13/2016 0345   K 4.7 08/24/2013 1219   CL 107 01/13/2016 0345   CO2 27 01/13/2016 0345   CO2 24 08/24/2013 1219   GLUCOSE 105 (H) 01/13/2016 0345   GLUCOSE 88 08/24/2013 1219   BUN 41 (H) 01/13/2016 0345   BUN 15.8 08/24/2013 1219   CREATININE 1.28 (H) 01/13/2016 0345   CREATININE 1.1 08/24/2013 1219   CALCIUM 9.1 01/13/2016 0345   CALCIUM 8.9 08/24/2013 1219   PROT 6.1 (L) 01/13/2016 0345   PROT 6.5 08/24/2013 1219   ALBUMIN 3.6 01/13/2016 0345   ALBUMIN 3.7 08/24/2013 1219   AST 13 (L) 01/13/2016 0345   AST 15 08/24/2013 1219   ALT 8 (L) 01/13/2016 0345   ALT 8 08/24/2013 1219   ALKPHOS 51 01/13/2016 0345   ALKPHOS 47 08/24/2013 1219   BILITOT 0.6 01/13/2016 0345   BILITOT 0.23 08/24/2013 1219   GFRNONAA 37 (L) 01/13/2016 0345   GFRAA 43 (L) 01/13/2016 0345    No results found for: SPEP, UPEP  Lab Results  Component Value Date   WBC 3.8 (L) 01/25/2016   NEUTROABS 2.9 01/25/2016   HGB 8.5 (L) 01/25/2016   HCT 28.1 (L) 01/25/2016   MCV 97.9 01/25/2016   PLT 247 01/25/2016      Chemistry      Component Value Date/Time   NA 141 01/13/2016 0345   NA 131 (L) 08/24/2013 1219   K 4.4 01/13/2016 0345   K 4.7 08/24/2013 1219   CL 107 01/13/2016 0345   CO2 27 01/13/2016 0345   CO2 24 08/24/2013 1219   BUN 41 (H) 01/13/2016 0345   BUN 15.8 08/24/2013 1219   CREATININE 1.28 (H) 01/13/2016 0345   CREATININE 1.1 08/24/2013 1219      Component Value Date/Time   CALCIUM 9.1 01/13/2016 0345   CALCIUM 8.9 08/24/2013 1219   ALKPHOS 51 01/13/2016 0345    ALKPHOS 47 08/24/2013 1219   AST 13 (L) 01/13/2016 0345   AST 15 08/24/2013 1219   ALT 8 (L) 01/13/2016 0345   ALT 8 08/24/2013 1219   BILITOT 0.6 01/13/2016 0345   BILITOT 0.23 08/24/2013 1219       ASSESSMENT & PLAN:  Iron deficiency anemia due to chronic blood loss The primary suspect is severe GI bleed from underlying GAVE. She has received numerous iron infusions. She will continue to come back here on a regular basis for blood check and intermittent iron infusion. Goal is to keep giving her iron infusion until she is no longer anemic or her GI bleed stops. I will proceed with iron infusion today. In the meantime, she will continue supplementation with folic acid and vitamin B 12 by  mouth. The most likely cause of her anemia is due to chronic blood loss/malabsorption syndrome. We discussed some of the risks, benefits, and alternatives of intravenous iron infusions. The patient is symptomatic from anemia and the iron level is critically low. She tolerated oral iron supplement poorly and desires to achieved higher levels of iron faster for adequate hematopoesis. Some of the side-effects to be expected including risks of infusion reactions, phlebitis, headaches, nausea and fatigue.  The patient is willing to proceed. Patient education material was dispensed.  Goal is to keep ferritin level greater than 100  Leukopenia This is likely due to recent stress and her age. The patient denies recent history of fevers, cough, chills, diarrhea or dysuria. She is asymptomatic from the leukopenia. I will observe for now.    Orders Placed This Encounter  Procedures  . CBC & Diff and Retic    Standing Status:   Standing    Number of Occurrences:   9    Standing Expiration Date:   01/24/2017  . Ferritin    Standing Status:   Standing    Number of Occurrences:   9    Standing Expiration Date:   01/24/2017  . Hold Tube, Blood Bank    Standing Status:   Standing    Number of Occurrences:   9     Standing Expiration Date:   01/24/2017    All questions were answered. The patient knows to call the clinic with any problems, questions or concerns. No barriers to learning was detected.  I spent 15 minutes counseling the patient face to face. The total time spent in the appointment was 20 minutes and more than 50% was on counseling.     [name], MD 12/21/20172:32 PM

## 2016-01-26 NOTE — Assessment & Plan Note (Signed)
This is likely due to recent stress and her age. The patient denies recent history of fevers, cough, chills, diarrhea or dysuria. She is asymptomatic from the leukopenia. I will observe for now.

## 2016-02-02 DIAGNOSIS — M549 Dorsalgia, unspecified: Secondary | ICD-10-CM | POA: Diagnosis not present

## 2016-02-02 DIAGNOSIS — G4700 Insomnia, unspecified: Secondary | ICD-10-CM | POA: Diagnosis not present

## 2016-02-02 DIAGNOSIS — M5136 Other intervertebral disc degeneration, lumbar region: Secondary | ICD-10-CM | POA: Diagnosis not present

## 2016-02-02 DIAGNOSIS — M810 Age-related osteoporosis without current pathological fracture: Secondary | ICD-10-CM | POA: Diagnosis not present

## 2016-02-07 ENCOUNTER — Ambulatory Visit (HOSPITAL_COMMUNITY)
Admission: RE | Admit: 2016-02-07 | Discharge: 2016-02-07 | Disposition: A | Discharge status: Home / Self Care | Payer: Medicare Other | Source: Ambulatory Visit | Attending: Hematology and Oncology | Admitting: Hematology and Oncology

## 2016-02-10 ENCOUNTER — Ambulatory Visit (HOSPITAL_BASED_OUTPATIENT_CLINIC_OR_DEPARTMENT_OTHER): Payer: Medicare Other

## 2016-02-10 ENCOUNTER — Other Ambulatory Visit (HOSPITAL_BASED_OUTPATIENT_CLINIC_OR_DEPARTMENT_OTHER): Payer: Medicare Other

## 2016-02-10 VITALS — BP 135/64 | HR 79 | Temp 98.0°F | Resp 17

## 2016-02-10 DIAGNOSIS — D539 Nutritional anemia, unspecified: Secondary | ICD-10-CM

## 2016-02-10 DIAGNOSIS — D508 Other iron deficiency anemias: Secondary | ICD-10-CM

## 2016-02-10 DIAGNOSIS — D500 Iron deficiency anemia secondary to blood loss (chronic): Secondary | ICD-10-CM

## 2016-02-10 DIAGNOSIS — K31819 Angiodysplasia of stomach and duodenum without bleeding: Secondary | ICD-10-CM | POA: Diagnosis present

## 2016-02-10 LAB — CBC & DIFF AND RETIC
BASO%: 0.2 % (ref 0.0–2.0)
Basophils Absolute: 0 10*3/uL (ref 0.0–0.1)
EOS%: 0.6 % (ref 0.0–7.0)
Eosinophils Absolute: 0 10*3/uL (ref 0.0–0.5)
HCT: 29.6 % — ABNORMAL LOW (ref 34.8–46.6)
HGB: 8.9 g/dL — ABNORMAL LOW (ref 11.6–15.9)
IMMATURE RETIC FRACT: 17.8 % — AB (ref 1.60–10.00)
LYMPH%: 12.9 % — AB (ref 14.0–49.7)
MCH: 30.8 pg (ref 25.1–34.0)
MCHC: 30.1 g/dL — ABNORMAL LOW (ref 31.5–36.0)
MCV: 102.4 fL — AB (ref 79.5–101.0)
MONO#: 0.5 10*3/uL (ref 0.1–0.9)
MONO%: 8.8 % (ref 0.0–14.0)
NEUT%: 77.5 % — ABNORMAL HIGH (ref 38.4–76.8)
NEUTROS ABS: 4.2 10*3/uL (ref 1.5–6.5)
Platelets: 214 10*3/uL (ref 145–400)
RBC: 2.89 10*6/uL — AB (ref 3.70–5.45)
RDW: 18 % — ABNORMAL HIGH (ref 11.2–14.5)
Retic %: 4.16 % — ABNORMAL HIGH (ref 0.70–2.10)
Retic Ct Abs: 120.22 10*3/uL — ABNORMAL HIGH (ref 33.70–90.70)
WBC: 5.4 10*3/uL (ref 3.9–10.3)
lymph#: 0.7 10*3/uL — ABNORMAL LOW (ref 0.9–3.3)

## 2016-02-10 LAB — FERRITIN: FERRITIN: 174 ng/mL (ref 9–269)

## 2016-02-10 MED ORDER — SODIUM CHLORIDE 0.9 % IV SOLN
INTRAVENOUS | Status: DC
  Administered 2016-02-10: 15:00:00 via INTRAVENOUS

## 2016-02-10 MED ORDER — SODIUM CHLORIDE 0.9 % IV SOLN
510.0000 mg | Freq: Once | INTRAVENOUS | Status: AC
  Administered 2016-02-10: 510 mg via INTRAVENOUS
  Filled 2016-02-10: qty 17

## 2016-02-10 NOTE — Patient Instructions (Signed)

## 2016-02-16 DIAGNOSIS — F419 Anxiety disorder, unspecified: Secondary | ICD-10-CM | POA: Diagnosis not present

## 2016-02-16 DIAGNOSIS — G629 Polyneuropathy, unspecified: Secondary | ICD-10-CM | POA: Diagnosis not present

## 2016-02-24 ENCOUNTER — Other Ambulatory Visit (HOSPITAL_BASED_OUTPATIENT_CLINIC_OR_DEPARTMENT_OTHER): Payer: Medicare Other

## 2016-02-24 DIAGNOSIS — K31819 Angiodysplasia of stomach and duodenum without bleeding: Secondary | ICD-10-CM

## 2016-02-24 DIAGNOSIS — D500 Iron deficiency anemia secondary to blood loss (chronic): Secondary | ICD-10-CM | POA: Diagnosis not present

## 2016-02-24 LAB — CBC & DIFF AND RETIC
BASO%: 0.2 % (ref 0.0–2.0)
Basophils Absolute: 0 10*3/uL (ref 0.0–0.1)
EOS ABS: 0.1 10*3/uL (ref 0.0–0.5)
EOS%: 1.3 % (ref 0.0–7.0)
HEMATOCRIT: 33.7 % — AB (ref 34.8–46.6)
HEMOGLOBIN: 10.7 g/dL — AB (ref 11.6–15.9)
IMMATURE RETIC FRACT: 9 % (ref 1.60–10.00)
LYMPH%: 10.9 % — AB (ref 14.0–49.7)
MCH: 31.9 pg (ref 25.1–34.0)
MCHC: 31.8 g/dL (ref 31.5–36.0)
MCV: 100.6 fL (ref 79.5–101.0)
MONO#: 0.5 10*3/uL (ref 0.1–0.9)
MONO%: 10.2 % (ref 0.0–14.0)
NEUT#: 3.6 10*3/uL (ref 1.5–6.5)
NEUT%: 77.4 % — AB (ref 38.4–76.8)
PLATELETS: 258 10*3/uL (ref 145–400)
RBC: 3.35 10*6/uL — ABNORMAL LOW (ref 3.70–5.45)
RDW: 15.7 % — ABNORMAL HIGH (ref 11.2–14.5)
Retic %: 2.24 % — ABNORMAL HIGH (ref 0.70–2.10)
Retic Ct Abs: 75.04 10*3/uL (ref 33.70–90.70)
WBC: 4.7 10*3/uL (ref 3.9–10.3)
lymph#: 0.5 10*3/uL — ABNORMAL LOW (ref 0.9–3.3)

## 2016-02-24 LAB — FERRITIN: Ferritin: 232 ng/ml (ref 9–269)

## 2016-03-09 ENCOUNTER — Other Ambulatory Visit (HOSPITAL_BASED_OUTPATIENT_CLINIC_OR_DEPARTMENT_OTHER): Payer: Medicare Other

## 2016-03-09 ENCOUNTER — Encounter: Payer: Self-pay | Admitting: Hematology and Oncology

## 2016-03-09 ENCOUNTER — Ambulatory Visit: Payer: Medicare Other

## 2016-03-09 ENCOUNTER — Telehealth: Payer: Self-pay | Admitting: *Deleted

## 2016-03-09 ENCOUNTER — Ambulatory Visit (HOSPITAL_BASED_OUTPATIENT_CLINIC_OR_DEPARTMENT_OTHER): Payer: Medicare Other | Admitting: Hematology and Oncology

## 2016-03-09 ENCOUNTER — Telehealth: Payer: Self-pay | Admitting: Hematology and Oncology

## 2016-03-09 DIAGNOSIS — K31819 Angiodysplasia of stomach and duodenum without bleeding: Secondary | ICD-10-CM

## 2016-03-09 DIAGNOSIS — D500 Iron deficiency anemia secondary to blood loss (chronic): Secondary | ICD-10-CM

## 2016-03-09 LAB — CBC & DIFF AND RETIC
BASO%: 0.3 % (ref 0.0–2.0)
Basophils Absolute: 0 10*3/uL (ref 0.0–0.1)
EOS ABS: 0.1 10*3/uL (ref 0.0–0.5)
EOS%: 2.3 % (ref 0.0–7.0)
HCT: 34.2 % — ABNORMAL LOW (ref 34.8–46.6)
HEMOGLOBIN: 11.2 g/dL — AB (ref 11.6–15.9)
Immature Retic Fract: 11.7 % — ABNORMAL HIGH (ref 1.60–10.00)
LYMPH%: 16.5 % (ref 14.0–49.7)
MCH: 31.4 pg (ref 25.1–34.0)
MCHC: 32.7 g/dL (ref 31.5–36.0)
MCV: 95.8 fL (ref 79.5–101.0)
MONO#: 0.5 10*3/uL (ref 0.1–0.9)
MONO%: 12.9 % (ref 0.0–14.0)
NEUT%: 68 % (ref 38.4–76.8)
NEUTROS ABS: 2.6 10*3/uL (ref 1.5–6.5)
Platelets: 186 10*3/uL (ref 145–400)
RBC: 3.57 10*6/uL — AB (ref 3.70–5.45)
RDW: 14.1 % (ref 11.2–14.5)
RETIC %: 1.95 % (ref 0.70–2.10)
Retic Ct Abs: 69.62 10*3/uL (ref 33.70–90.70)
WBC: 3.9 10*3/uL (ref 3.9–10.3)
lymph#: 0.6 10*3/uL — ABNORMAL LOW (ref 0.9–3.3)

## 2016-03-09 LAB — FERRITIN: FERRITIN: 120 ng/mL (ref 9–269)

## 2016-03-09 NOTE — Telephone Encounter (Signed)
Per 2/2 LOS and staff message I have wschedueld appts ans notified the scheduler

## 2016-03-09 NOTE — Telephone Encounter (Signed)
Appointments scheduled per 2/2 LOS. Patient given AVS report and calendars with future scheduled appointments. °

## 2016-03-09 NOTE — Progress Notes (Signed)
[facility] OFF[facility] PROGRESS NOTE  Horatio Pel, MD SUMMARY OF HEMATOLOGIC HISTORY:  She was found to have abnormal CBC from recent blood count monitoring. The patient have chronic iron deficiency anemia due to recurrent GI bleed. 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 She received blood transfusion and iron treatment INTERVAL HISTORY: Sophia [name] 83 y.o. female returns for follow-up. She feels well. Denies chest pain, dizziness or shortness of breath. The patient denies any recent signs or symptoms of bleeding such as spontaneous epistaxis, hematuria or hematochezia.   I have reviewed the past medical history, past surgical history, [name]; risedronate sodium; sertraline; fosamax [alendronate sodium]; lipitor [atorvastatin]; pregabalin; tolterodine tartrate; and wasp venom.  MEDICATIONS:  Current Outpatient Prescriptions  Medication Sig Dispense Refill  . ALPRAZolam (XANAX) 0.25 MG tablet Take 0.25 mg by mouth 2 (two) times daily as needed for anxiety.    . Calcium Carb-Cholecalciferol (CALCIUM-VITAMIN D) 500-200 MG-UNIT tablet Take by mouth.    . cholecalciferol (VITAMIN D) 1000 units tablet Take 2,000 Units by mouth daily.    [name] denosumab (PROLIA) 60 MG/ML SOLN Inject 60 mg into the skin every 6 (six) months.     . dexlansoprazole (DEXILANT) 60 MG capsule Take 60 mg by mouth daily.    [name] EPINEPHrine (EPIPEN 2-PAK) 0.3 mg/0.3  mL IJ SOAJ injection Inject 0.3 mg into the muscle once as needed (for severe allergic reaction).    . ferumoxytol (FERAHEME) 510 MG/17ML SOLN injection Inject 510 mg into the vein as needed (for low iron).     . fluorometholone (FML) 0.1 % ophthalmic suspension Place 1 drop into the left eye daily.     . folic acid (FOLVITE) 259 MCG tablet Take 800 mcg by mouth daily.    [name] gabapentin (NEURONTIN) 400 MG capsule Take 400 mg by mouth 2 (two) times daily.     . hydroxypropyl methylcellulose / hypromellose (ISOPTO TEARS / GONIOVISC) 2.5 % ophthalmic solution Place 1-2 drops into the right eye as needed for dry eyes.     [name] sertraline (ZOLOFT) 100 MG tablet     . verapamil (CALAN-SR) 240 MG CR tablet Take 240 mg by mouth daily.     No current facility-administered medications for this visit.      REVIEW OF SYSTEMS:   Constitutional: Denies fevers, [name], nose, mouth, throat, [name], [name], [name], [name], [name], no new changes  All other systems were reviewed with the patient and are negative.  PHYSICAL EXAMINATION: ECOG PERFORMANCE STATUS: 1 - Symptomatic but completely ambulatory  Vitals:   03/09/16 1348  BP: (!) 113/51  Pulse: 78  Resp: 17  Temp: 97.9 F (36.6 C)   Filed  Weights    GENERAL:alert, [name], texture, turgor are normal, [name], [name], sclera clear Musculoskeletal:no cyanosis of digits and no clubbing  NEURO: alert & oriented x 3  with fluent speech, no focal motor/sensory deficits  LABORATORY DATA:  I have reviewed the data as listed     Component Value Date/Time   NA 141 01/13/2016 0345   NA 131 (L) 08/24/2013 1219   K 4.4 01/13/2016 0345   K 4.7 08/24/2013 1219   CL 107 01/13/2016 0345   CO2 27 01/13/2016 0345   CO2 24 08/24/2013 1219   GLUCOSE 105 (H) 01/13/2016 0345   GLUCOSE 88 08/24/2013 1219   BUN 41 (H) 01/13/2016 0345   BUN 15.8 08/24/2013 1219   CREATININE 1.28 (H) 01/13/2016 0345   CREATININE 1.1 08/24/2013 1219   CALCIUM 9.1 01/13/2016 0345   CALCIUM 8.9 08/24/2013 1219   PROT 6.1 (L) 01/13/2016 0345   PROT 6.5 08/24/2013 1219   ALBUMIN 3.6 01/13/2016 0345   ALBUMIN 3.7 08/24/2013 1219   AST 13 (L) 01/13/2016 0345   AST 15 08/24/2013 1219   ALT 8 (L) 01/13/2016 0345   ALT 8 08/24/2013 1219   ALKPHOS 51 01/13/2016 0345   ALKPHOS 47 08/24/2013 1219   BILITOT 0.6 01/13/2016 0345   BILITOT 0.23 08/24/2013 1219   GFRNONAA 37 (L) 01/13/2016 0345   GFRAA 43 (L) 01/13/2016 0345    No results found for: SPEP, UPEP  Lab Results  Component Value Date   WBC 3.9 03/09/2016   NEUTROABS 2.6 03/09/2016   HGB 11.2 (L) 03/09/2016   HCT 34.2 (L) 03/09/2016   MCV 95.8 03/09/2016   PLT 186 03/09/2016      Chemistry      Component Value Date/Time   NA 141 01/13/2016 0345   NA 131 (L) 08/24/2013 1219   K 4.4 01/13/2016 0345   K 4.7 08/24/2013 1219   CL 107 01/13/2016 0345   CO2 27 01/13/2016 0345   CO2 24 08/24/2013 1219   BUN 41 (H) 01/13/2016 0345   BUN 15.8 08/24/2013 1219   CREATININE 1.28 (H) 01/13/2016 0345   CREATININE 1.1 08/24/2013 1219      Component Value Date/Time   CALCIUM 9.1 01/13/2016 0345   CALCIUM 8.9 08/24/2013 1219   ALKPHOS 51 01/13/2016 0345   ALKPHOS 47 08/24/2013 1219   AST 13 (L) 01/13/2016 0345   AST 15 08/24/2013 1219   ALT 8 (L) 01/13/2016 0345   ALT 8 08/24/2013 1219   BILITOT 0.6 01/13/2016 0345   BILITOT 0.23 08/24/2013 1219      ASSESSMENT &  PLAN:  Iron deficiency anemia due to chronic blood loss The primary suspect is severe GI bleed from underlying GAVE. She has received numerous iron infusions. She will continue to come back here on a regular basis for blood check and intermittent iron infusion. Goal is to keep giving her iron infusion until she is no longer anemic or her GI bleed stops. I will hold iron infusion today. In the meantime, she will continue supplementation with folic acid and vitamin B 12 by mouth. The most likely cause of her anemia is due to chronic blood loss/malabsorption syndrome. We discussed some of the risks, benefits, and alternatives of intravenous iron infusions. The patient is symptomatic from anemia and the iron level is critically low. She tolerated oral iron supplement poorly and desires to achieved higher levels of  iron faster for adequate hematopoesis. Some of the side-effects to be expected including risks of infusion reactions, phlebitis, headaches, nausea and fatigue.  The patient is willing to proceed. Patient education material was dispensed.  Goal is to keep ferritin level greater than 100 I will space out her appointment to every 6 weeks, starting 03/23/16   No orders of the defined types were placed in this encounter.   All questions were answered. The patient knows to call the clinic with any problems, questions or concerns. No barriers to learning was detected.  I spent 7 minutes counseling the patient face to face. The total time spent in the appointment was 10 minutes and more than 50% was on counseling.     [name], MD 2/2/20187:31 PM

## 2016-03-09 NOTE — Assessment & Plan Note (Signed)
The primary suspect is severe GI bleed from underlying GAVE. She has received numerous iron infusions. She will continue to come back here on a regular basis for blood check and intermittent iron infusion. Goal is to keep giving her iron infusion until she is no longer anemic or her GI bleed stops. I will hold iron infusion today. In the meantime, she will continue supplementation with folic acid and vitamin B 12 by mouth. The most likely cause of her anemia is due to chronic blood loss/malabsorption syndrome. We discussed some of the risks, benefits, and alternatives of intravenous iron infusions. The patient is symptomatic from anemia and the iron level is critically low. She tolerated oral iron supplement poorly and desires to achieved higher levels of iron faster for adequate hematopoesis. Some of the side-effects to be expected including risks of infusion reactions, phlebitis, headaches, nausea and fatigue.  The patient is willing to proceed. Patient education material was dispensed.  Goal is to keep ferritin level greater than 100 I will space out her appointment to every 6 weeks, starting 03/23/16

## 2016-03-14 ENCOUNTER — Ambulatory Visit (HOSPITAL_COMMUNITY)
Admission: RE | Admit: 2016-03-14 | Discharge: 2016-03-14 | Disposition: A | Discharge status: Home / Self Care | Payer: Medicare Other | Source: Ambulatory Visit | Attending: Hematology and Oncology | Admitting: Hematology and Oncology

## 2016-03-23 ENCOUNTER — Other Ambulatory Visit (HOSPITAL_BASED_OUTPATIENT_CLINIC_OR_DEPARTMENT_OTHER): Payer: Medicare Other

## 2016-03-23 ENCOUNTER — Other Ambulatory Visit: Payer: Self-pay

## 2016-03-23 ENCOUNTER — Telehealth: Payer: Self-pay

## 2016-03-23 DIAGNOSIS — K31819 Angiodysplasia of stomach and duodenum without bleeding: Secondary | ICD-10-CM | POA: Diagnosis not present

## 2016-03-23 DIAGNOSIS — D500 Iron deficiency anemia secondary to blood loss (chronic): Secondary | ICD-10-CM

## 2016-03-23 LAB — COMPREHENSIVE METABOLIC PANEL
ALBUMIN: 3.7 g/dL (ref 3.5–5.0)
ALT: 10 U/L (ref 0–55)
AST: 16 U/L (ref 5–34)
Alkaline Phosphatase: 76 U/L (ref 40–150)
Anion Gap: 10 mEq/L (ref 3–11)
BUN: 28.5 mg/dL — AB (ref 7.0–26.0)
CHLORIDE: 106 meq/L (ref 98–109)
CO2: 23 meq/L (ref 22–29)
Calcium: 10.7 mg/dL — ABNORMAL HIGH (ref 8.4–10.4)
Creatinine: 1.4 mg/dL — ABNORMAL HIGH (ref 0.6–1.1)
EGFR: 34 mL/min/{1.73_m2} — ABNORMAL LOW (ref >90)
Glucose: 94 mg/dl (ref 70–140)
POTASSIUM: 4 meq/L (ref 3.5–5.1)
SODIUM: 139 meq/L (ref 136–145)
Total Bilirubin: 0.29 mg/dL (ref 0.20–1.20)
Total Protein: 6.6 g/dL (ref 6.4–8.3)

## 2016-03-23 LAB — CBC & DIFF AND RETIC
BASO%: 0.2 % (ref 0.0–2.0)
BASOS ABS: 0 10*3/uL (ref 0.0–0.1)
EOS%: 1.9 % (ref 0.0–7.0)
Eosinophils Absolute: 0.1 10*3/uL (ref 0.0–0.5)
HEMATOCRIT: 35.1 % (ref 34.8–46.6)
HGB: 11.4 g/dL — ABNORMAL LOW (ref 11.6–15.9)
IMMATURE RETIC FRACT: 11.4 % — AB (ref 1.60–10.00)
LYMPH#: 0.7 10*3/uL — AB (ref 0.9–3.3)
LYMPH%: 14.3 % (ref 14.0–49.7)
MCH: 31.3 pg (ref 25.1–34.0)
MCHC: 32.5 g/dL (ref 31.5–36.0)
MCV: 96.4 fL (ref 79.5–101.0)
MONO#: 0.4 10*3/uL (ref 0.1–0.9)
MONO%: 7.2 % (ref 0.0–14.0)
NEUT#: 3.7 10*3/uL (ref 1.5–6.5)
NEUT%: 76.4 % (ref 38.4–76.8)
PLATELETS: 239 10*3/uL (ref 145–400)
RBC: 3.64 10*6/uL — ABNORMAL LOW (ref 3.70–5.45)
RDW: 13.4 % (ref 11.2–14.5)
RETIC CT ABS: 40.77 10*3/uL (ref 33.70–90.70)
Retic %: 1.12 % (ref 0.70–2.10)
WBC: 4.8 10*3/uL (ref 3.9–10.3)

## 2016-03-23 LAB — FERRITIN: Ferritin: 59 ng/ml (ref 9–269)

## 2016-03-23 NOTE — Telephone Encounter (Signed)
-----   Message from [name], MD sent at 03/23/2016  3:22 PM EST ----- Regarding: labs Please call her Labs showed mild dehydration, drink more water I have placed order for repeat labs and IV iron in 2 weeks due to falling ferritin level ----- Message ----- From: Interface, Lab In Three Zero One Sent: 03/23/2016   2:06 PM To: [name], MD

## 2016-03-23 NOTE — Telephone Encounter (Signed)
Patient son given below message.

## 2016-03-26 ENCOUNTER — Telehealth: Payer: Self-pay | Admitting: Hematology and Oncology

## 2016-03-26 NOTE — Telephone Encounter (Signed)
sw pt son to confirm 3/2 appt at 130 pm per LOS. Made afternoon appt per son request

## 2016-04-05 ENCOUNTER — Ambulatory Visit (HOSPITAL_COMMUNITY)
Admission: RE | Admit: 2016-04-05 | Discharge: 2016-04-05 | Disposition: A | Discharge status: Home / Self Care | Payer: Medicare Other | Source: Ambulatory Visit | Attending: Hematology and Oncology | Admitting: Hematology and Oncology

## 2016-04-06 ENCOUNTER — Ambulatory Visit (HOSPITAL_BASED_OUTPATIENT_CLINIC_OR_DEPARTMENT_OTHER): Payer: Medicare Other

## 2016-04-06 ENCOUNTER — Other Ambulatory Visit (HOSPITAL_BASED_OUTPATIENT_CLINIC_OR_DEPARTMENT_OTHER): Payer: Medicare Other

## 2016-04-06 VITALS — BP 126/65 | HR 80 | Temp 98.5°F | Resp 18

## 2016-04-06 DIAGNOSIS — D500 Iron deficiency anemia secondary to blood loss (chronic): Secondary | ICD-10-CM

## 2016-04-06 DIAGNOSIS — K31819 Angiodysplasia of stomach and duodenum without bleeding: Secondary | ICD-10-CM | POA: Diagnosis not present

## 2016-04-06 DIAGNOSIS — D539 Nutritional anemia, unspecified: Secondary | ICD-10-CM

## 2016-04-06 DIAGNOSIS — D508 Other iron deficiency anemias: Secondary | ICD-10-CM

## 2016-04-06 LAB — CBC & DIFF AND RETIC
BASO%: 0.2 % (ref 0.0–2.0)
BASOS ABS: 0 10*3/uL (ref 0.0–0.1)
EOS ABS: 0.1 10*3/uL (ref 0.0–0.5)
EOS%: 1.1 % (ref 0.0–7.0)
HEMATOCRIT: 35.1 % (ref 34.8–46.6)
HEMOGLOBIN: 11.4 g/dL — AB (ref 11.6–15.9)
IMMATURE RETIC FRACT: 9.4 % (ref 1.60–10.00)
LYMPH%: 13.5 % — AB (ref 14.0–49.7)
MCH: 30.8 pg (ref 25.1–34.0)
MCHC: 32.5 g/dL (ref 31.5–36.0)
MCV: 94.9 fL (ref 79.5–101.0)
MONO#: 0.4 10*3/uL (ref 0.1–0.9)
MONO%: 8.6 % (ref 0.0–14.0)
NEUT%: 76.6 % (ref 38.4–76.8)
NEUTROS ABS: 3.4 10*3/uL (ref 1.5–6.5)
PLATELETS: 218 10*3/uL (ref 145–400)
RBC: 3.7 10*6/uL (ref 3.70–5.45)
RDW: 13.2 % (ref 11.2–14.5)
Retic %: 1.14 % (ref 0.70–2.10)
Retic Ct Abs: 42.18 10*3/uL (ref 33.70–90.70)
WBC: 4.4 10*3/uL (ref 3.9–10.3)
lymph#: 0.6 10*3/uL — ABNORMAL LOW (ref 0.9–3.3)

## 2016-04-06 LAB — FERRITIN: FERRITIN: 39 ng/mL (ref 9–269)

## 2016-04-06 MED ORDER — SODIUM CHLORIDE 0.9 % IV SOLN
510.0000 mg | Freq: Once | INTRAVENOUS | Status: AC
  Administered 2016-04-06: 510 mg via INTRAVENOUS
  Filled 2016-04-06: qty 17

## 2016-04-06 NOTE — Patient Instructions (Signed)

## 2016-04-06 NOTE — Progress Notes (Signed)
Per Dr. Alvy Bimler, no blood, only feraheme today.   Wylene Simmer, BSN, RN 04/06/2016 2:59 PM

## 2016-04-07 ENCOUNTER — Other Ambulatory Visit: Payer: Self-pay | Admitting: Gastroenterology

## 2016-05-04 ENCOUNTER — Ambulatory Visit (HOSPITAL_BASED_OUTPATIENT_CLINIC_OR_DEPARTMENT_OTHER): Payer: Medicare Other

## 2016-05-04 ENCOUNTER — Other Ambulatory Visit (HOSPITAL_BASED_OUTPATIENT_CLINIC_OR_DEPARTMENT_OTHER): Payer: Medicare Other

## 2016-05-04 VITALS — BP 146/67 | HR 82 | Temp 98.0°F | Resp 18

## 2016-05-04 DIAGNOSIS — D500 Iron deficiency anemia secondary to blood loss (chronic): Secondary | ICD-10-CM

## 2016-05-04 DIAGNOSIS — D508 Other iron deficiency anemias: Secondary | ICD-10-CM

## 2016-05-04 DIAGNOSIS — D539 Nutritional anemia, unspecified: Secondary | ICD-10-CM

## 2016-05-04 DIAGNOSIS — D509 Iron deficiency anemia, unspecified: Secondary | ICD-10-CM

## 2016-05-04 DIAGNOSIS — K31819 Angiodysplasia of stomach and duodenum without bleeding: Secondary | ICD-10-CM

## 2016-05-04 LAB — CBC & DIFF AND RETIC
BASO%: 0.2 % (ref 0.0–2.0)
Basophils Absolute: 0 10*3/uL (ref 0.0–0.1)
EOS%: 1.4 % (ref 0.0–7.0)
Eosinophils Absolute: 0.1 10*3/uL (ref 0.0–0.5)
HEMATOCRIT: 35.7 % (ref 34.8–46.6)
HGB: 11.5 g/dL — ABNORMAL LOW (ref 11.6–15.9)
Immature Retic Fract: 10.5 % — ABNORMAL HIGH (ref 1.60–10.00)
LYMPH%: 19.9 % (ref 14.0–49.7)
MCH: 31.1 pg (ref 25.1–34.0)
MCHC: 32.2 g/dL (ref 31.5–36.0)
MCV: 96.5 fL (ref 79.5–101.0)
MONO#: 0.4 10*3/uL (ref 0.1–0.9)
MONO%: 9.8 % (ref 0.0–14.0)
NEUT%: 68.7 % (ref 38.4–76.8)
NEUTROS ABS: 2.9 10*3/uL (ref 1.5–6.5)
Platelets: 246 10*3/uL (ref 145–400)
RBC: 3.7 10*6/uL (ref 3.70–5.45)
RDW: 14.6 % — AB (ref 11.2–14.5)
RETIC %: 2.04 % (ref 0.70–2.10)
Retic Ct Abs: 75.48 10*3/uL (ref 33.70–90.70)
WBC: 4.2 10*3/uL (ref 3.9–10.3)
lymph#: 0.8 10*3/uL — ABNORMAL LOW (ref 0.9–3.3)

## 2016-05-04 LAB — FERRITIN: Ferritin: 164 ng/ml (ref 9–269)

## 2016-05-04 MED ORDER — SODIUM CHLORIDE 0.9 % IV SOLN
510.0000 mg | Freq: Once | INTRAVENOUS | Status: AC
  Administered 2016-05-04: 510 mg via INTRAVENOUS
  Filled 2016-05-04: qty 17

## 2016-05-04 MED ORDER — SODIUM CHLORIDE 0.9 % IV SOLN
INTRAVENOUS | Status: DC
  Administered 2016-05-04: 14:00:00 via INTRAVENOUS

## 2016-05-04 NOTE — Patient Instructions (Signed)

## 2016-06-05 ENCOUNTER — Ambulatory Visit (HOSPITAL_COMMUNITY)
Admission: RE | Admit: 2016-06-05 | Discharge: 2016-06-05 | Disposition: A | Discharge status: Home / Self Care | Payer: Medicare Other | Source: Ambulatory Visit | Attending: Hematology and Oncology | Admitting: Hematology and Oncology

## 2016-06-15 ENCOUNTER — Telehealth: Payer: Self-pay | Admitting: Hematology and Oncology

## 2016-06-15 ENCOUNTER — Encounter: Payer: Self-pay | Admitting: Hematology and Oncology

## 2016-06-15 ENCOUNTER — Other Ambulatory Visit (HOSPITAL_BASED_OUTPATIENT_CLINIC_OR_DEPARTMENT_OTHER): Payer: Medicare Other

## 2016-06-15 ENCOUNTER — Ambulatory Visit (HOSPITAL_BASED_OUTPATIENT_CLINIC_OR_DEPARTMENT_OTHER): Payer: Medicare Other

## 2016-06-15 ENCOUNTER — Ambulatory Visit (HOSPITAL_BASED_OUTPATIENT_CLINIC_OR_DEPARTMENT_OTHER): Payer: Medicare Other | Admitting: Hematology and Oncology

## 2016-06-15 VITALS — BP 96/53 | HR 67 | Temp 98.3°F | Resp 18

## 2016-06-15 DIAGNOSIS — K31811 Angiodysplasia of stomach and duodenum with bleeding: Secondary | ICD-10-CM

## 2016-06-15 DIAGNOSIS — K31819 Angiodysplasia of stomach and duodenum without bleeding: Secondary | ICD-10-CM

## 2016-06-15 DIAGNOSIS — I350 Nonrheumatic aortic (valve) stenosis: Secondary | ICD-10-CM

## 2016-06-15 DIAGNOSIS — D508 Other iron deficiency anemias: Secondary | ICD-10-CM

## 2016-06-15 DIAGNOSIS — D500 Iron deficiency anemia secondary to blood loss (chronic): Secondary | ICD-10-CM

## 2016-06-15 DIAGNOSIS — D539 Nutritional anemia, unspecified: Secondary | ICD-10-CM

## 2016-06-15 LAB — CBC & DIFF AND RETIC
BASO%: 0.2 % (ref 0.0–2.0)
Basophils Absolute: 0 10*3/uL (ref 0.0–0.1)
EOS ABS: 0.1 10*3/uL (ref 0.0–0.5)
EOS%: 1.7 % (ref 0.0–7.0)
HCT: 32.1 % — ABNORMAL LOW (ref 34.8–46.6)
HEMOGLOBIN: 10.2 g/dL — AB (ref 11.6–15.9)
IMMATURE RETIC FRACT: 13.4 % — AB (ref 1.60–10.00)
LYMPH%: 19.6 % (ref 14.0–49.7)
MCH: 31.9 pg (ref 25.1–34.0)
MCHC: 31.8 g/dL (ref 31.5–36.0)
MCV: 100.3 fL (ref 79.5–101.0)
MONO#: 0.3 10*3/uL (ref 0.1–0.9)
MONO%: 8.2 % (ref 0.0–14.0)
NEUT%: 70.3 % (ref 38.4–76.8)
NEUTROS ABS: 2.8 10*3/uL (ref 1.5–6.5)
Platelets: 229 10*3/uL (ref 145–400)
RBC: 3.2 10*6/uL — ABNORMAL LOW (ref 3.70–5.45)
RDW: 14.7 % — AB (ref 11.2–14.5)
RETIC %: 2.64 % — AB (ref 0.70–2.10)
Retic Ct Abs: 84.48 10*3/uL (ref 33.70–90.70)
WBC: 4 10*3/uL (ref 3.9–10.3)
lymph#: 0.8 10*3/uL — ABNORMAL LOW (ref 0.9–3.3)

## 2016-06-15 LAB — FERRITIN: FERRITIN: 78 ng/mL (ref 9–269)

## 2016-06-15 MED ORDER — SODIUM CHLORIDE 0.9 % IV SOLN
510.0000 mg | Freq: Once | INTRAVENOUS | Status: AC
  Administered 2016-06-15: 510 mg via INTRAVENOUS
  Filled 2016-06-15: qty 17

## 2016-06-15 NOTE — Patient Instructions (Signed)

## 2016-06-15 NOTE — Assessment & Plan Note (Signed)
The primary suspect is severe GI bleed from underlying GAVE. She has received numerous iron infusions. She will continue to come back here on a regular basis for blood check and intermittent iron infusion. Goal is to keep giving her iron infusion until she is no longer anemic or her GI bleed stops. I will give her iron infusion today. In the meantime, she will continue supplementation with folic acid and vitamin B 12 by mouth. The most likely cause of her anemia is due to chronic blood loss/malabsorption syndrome. We discussed some of the risks, benefits, and alternatives of intravenous iron infusions. The patient is symptomatic from anemia and the iron level is critically low. She tolerated oral iron supplement poorly and desires to achieved higher levels of iron faster for adequate hematopoesis. Some of the side-effects to be expected including risks of infusion reactions, phlebitis, headaches, nausea and fatigue.  The patient is willing to proceed. Patient education material was dispensed.  Goal is to keep ferritin level greater than 100 I will space out her appointment to every 6 weeks

## 2016-06-15 NOTE — Progress Notes (Signed)
[facility] OFFICE PROGRESS NOTE  Sophia Pretty, MD SUMMARY OF HEMATOLOGIC HISTORY:  She was found to have abnormal CBC from recent blood count monitoring. The patient have chronic iron deficiency anemia due to recurrent GI bleed. 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 She received blood transfusion and iron treatment Since February 2018, she received intravenous iron every 6 weeks INTERVAL HISTORY: Sophia [name] 83 y.o. female returns for further follow-up.  She is feeling well.  Denies chest pain or shortness of breath. The patient denies any recent signs or symptoms of bleeding such as spontaneous epistaxis, hematuria or hematochezia. She denies recent syncopal episode or chest pain  I have reviewed the past medical history, past surgical history, [name]; risedronate sodium; sertraline; fosamax [alendronate sodium]; lipitor [atorvastatin]; pregabalin; tolterodine tartrate; and wasp venom.  MEDICATIONS:  Current Outpatient Prescriptions  Medication Sig Dispense Refill  . ALPRAZolam (XANAX) 0.25 MG tablet Take 0.25 mg by mouth 2 (two) times daily as needed for anxiety.    . Calcium Carb-Cholecalciferol (CALCIUM-VITAMIN D) 500-200 MG-UNIT tablet Take by mouth.    . cholecalciferol (VITAMIN D) 1000 units tablet Take 2,000 Units by mouth daily.    [name] denosumab (PROLIA) 60 MG/ML SOLN Inject 60 mg into the skin every 6 (six) months.     .  DEXILANT 60 MG capsule TAKE 1 TABLET BY MOUTH DAILY EVERY MORNING. 30 capsule 6  . dexlansoprazole (DEXILANT) 60 MG capsule Take 60 mg by mouth daily.    [name] EPINEPHrine (EPIPEN 2-PAK) 0.3 mg/0.3 mL IJ SOAJ injection Inject 0.3 mg into the muscle once as needed (for severe allergic reaction).    . ferumoxytol (FERAHEME) 510 MG/17ML SOLN injection Inject 510 mg into the vein as needed (for low iron).     . fluorometholone (FML) 0.1 % ophthalmic suspension Place 1 drop into the left eye daily.     . folic acid (FOLVITE) 458 MCG tablet Take 800 mcg by mouth daily.    [name] gabapentin (NEURONTIN) 400 MG capsule Take 400 mg by mouth 2 (two) times daily.     . hydroxypropyl methylcellulose / hypromellose (ISOPTO TEARS / GONIOVISC) 2.5 % ophthalmic solution Place 1-2 drops into the right eye as needed for dry eyes.     [name] sertraline (ZOLOFT) 100 MG tablet     . verapamil (CALAN-SR) 240 MG CR tablet Take 240 mg by mouth daily.     No current facility-administered medications for this visit.      REVIEW OF SYSTEMS:   Constitutional: Denies fevers, [name], nose, mouth, throat, [name], [name], [name], [name], [name], no new changes  All other systems were reviewed with the patient and are negative.  PHYSICAL EXAMINATION: ECOG PERFORMANCE STATUS: 1 - Symptomatic but completely ambulatory  Vitals:   06/15/16 1335  BP: (!) 103/32  Pulse: 72  Resp: 18  Temp: 98.5 F (36.9 C)   Filed Weights    GENERAL:alert, [name], texture, turgor  are normal, [name], [name], sclera clear OROPHARYNX:no exudate, no erythema and lips, buccal mucosa, and tongue normal  NECK: supple, thyroid normal size, non-tender, without nodularity LYMPH:  no palpable lymphadenopathy in the cervical, axillary or inguinal LUNGS: clear to auscultation and percussion with normal breathing effort HEART: regular rate & rhythm with ejection systolic murmurs and no lower extremity edema ABDOMEN:abdomen soft, non-tender and normal bowel sounds Musculoskeletal:no cyanosis of digits and no clubbing  NEURO: alert & oriented x 3 with fluent speech, no focal motor/sensory deficits  LABORATORY DATA:  I have reviewed the data as listed     Component Value Date/Time   NA 139 03/23/2016 1448   K 4.0 03/23/2016 1448   CL 107 01/13/2016 0345   CO2 23 03/23/2016 1448   GLUCOSE 94 03/23/2016 1448   BUN 28.5 (H) 03/23/2016 1448   CREATININE 1.4 (H) 03/23/2016 1448   CALCIUM 10.7 (H) 03/23/2016 1448   PROT 6.6 03/23/2016 1448   ALBUMIN 3.7 03/23/2016 1448   AST 16 03/23/2016 1448   ALT 10 03/23/2016 1448   ALKPHOS 76 03/23/2016 1448   BILITOT 0.29 03/23/2016 1448   GFRNONAA 37 (L) 01/13/2016 0345   GFRAA 43 (L) 01/13/2016 0345    No results found for: SPEP, UPEP  Lab Results  Component Value Date   WBC 4.0 06/15/2016   NEUTROABS 2.8 06/15/2016   HGB 10.2 (L) 06/15/2016   HCT 32.1 (L) 06/15/2016   MCV 100.3 06/15/2016   PLT 229 06/15/2016      Chemistry      Component Value Date/Time   NA 139 03/23/2016 1448   K 4.0 03/23/2016 1448   CL 107 01/13/2016 0345   CO2 23 03/23/2016 1448   BUN 28.5 (H) 03/23/2016 1448   CREATININE 1.4 (H) 03/23/2016 1448      Component Value Date/Time   CALCIUM 10.7 (H) 03/23/2016 1448   ALKPHOS 76 03/23/2016 1448   AST 16 03/23/2016 1448   ALT 10 03/23/2016 1448   BILITOT 0.29 03/23/2016 1448       ASSESSMENT & PLAN:  Iron deficiency  anemia due to chronic blood loss The primary suspect is severe GI bleed from underlying GAVE. She has received numerous iron infusions. She will continue to come back here on a regular basis for blood check and intermittent iron infusion. Goal is to keep giving her iron infusion until she is no longer anemic or her GI bleed stops. I will give her iron infusion today. In the meantime, she will continue supplementation with folic acid and vitamin B 12 by mouth. The most likely cause of her anemia is due to chronic blood loss/malabsorption syndrome. We discussed some of the risks, benefits, and alternatives of intravenous iron infusions. The patient is symptomatic from anemia and the iron level is critically low. She tolerated oral iron supplement poorly and desires to achieved higher levels of iron faster for adequate hematopoesis. Some of the side-effects to be expected including risks of infusion reactions, phlebitis, headaches, nausea and fatigue.  The patient is willing to proceed. Patient education material was dispensed.  Goal is to keep ferritin level greater than 100  I will space out her appointment to every 6 weeks  Aortic stenosis There is associated between aortic stenosis and GI bleed. 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 Given her age, surgery is probably not recommended.  Continue medical management    No orders of the defined types were placed in this encounter.   All questions were answered. The patient knows to call the clinic with any problems, questions or concerns. No barriers to learning was detected.  I spent 10 minutes counseling the patient face to face. The total time spent in the appointment was 15 minutes and more than 50% was on counseling.     [name], MD 5/11/20184:29 PM

## 2016-06-15 NOTE — Telephone Encounter (Signed)
Appointments scheduled per 5.11.18 LOS. Patient given AVS report and calendars with future scheduled appointments. °

## 2016-06-15 NOTE — Assessment & Plan Note (Signed)
There is associated between aortic stenosis and GI bleed. 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 Given her age, surgery is probably not recommended.  Continue medical management

## 2016-06-21 IMAGING — CR DG LUMBAR SPINE COMPLETE 4+V
5 series · 5 of 5 positions shown · non-contrast
Comparison: Abdominal CT dated 11/07/2011

CLINICAL DATA: 86-year-old female with back pain

EXAM:
LUMBAR SPINE - COMPLETE 4+ VIEW

[t lumbar spine ap]
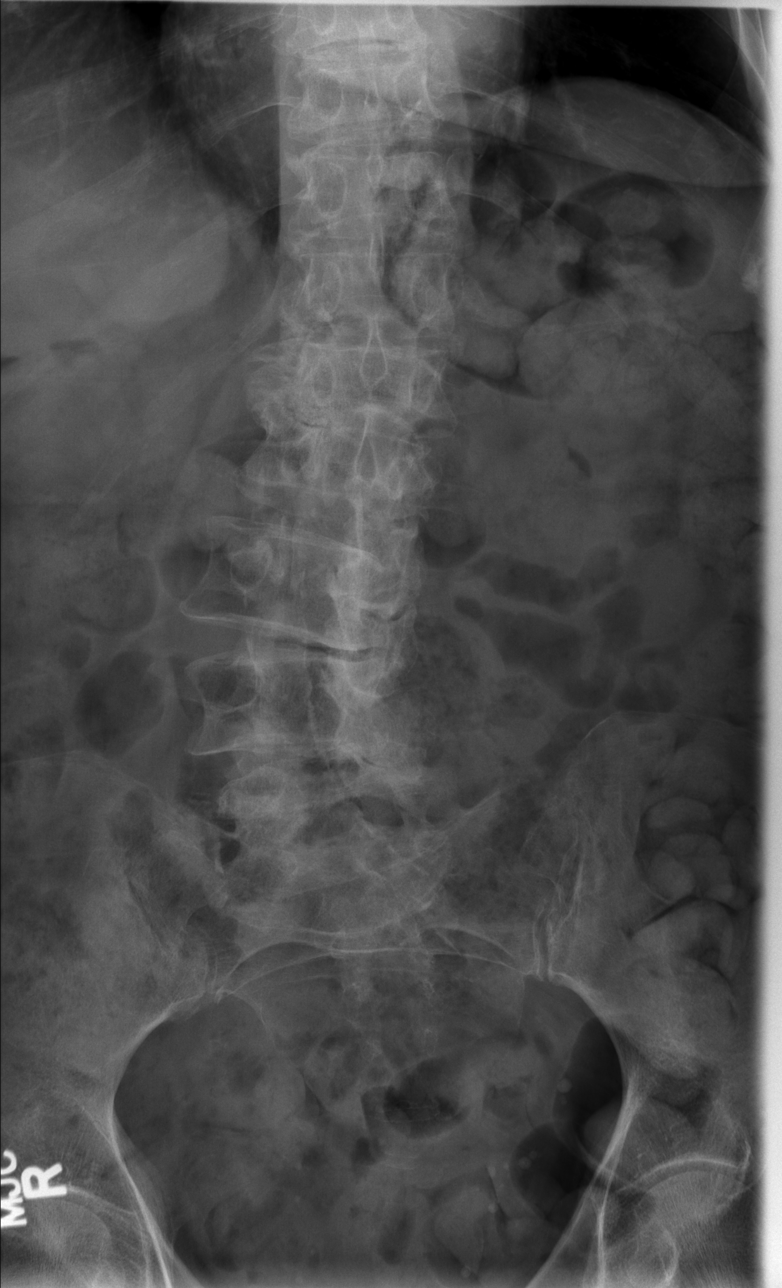

[t lumbar spine obl]
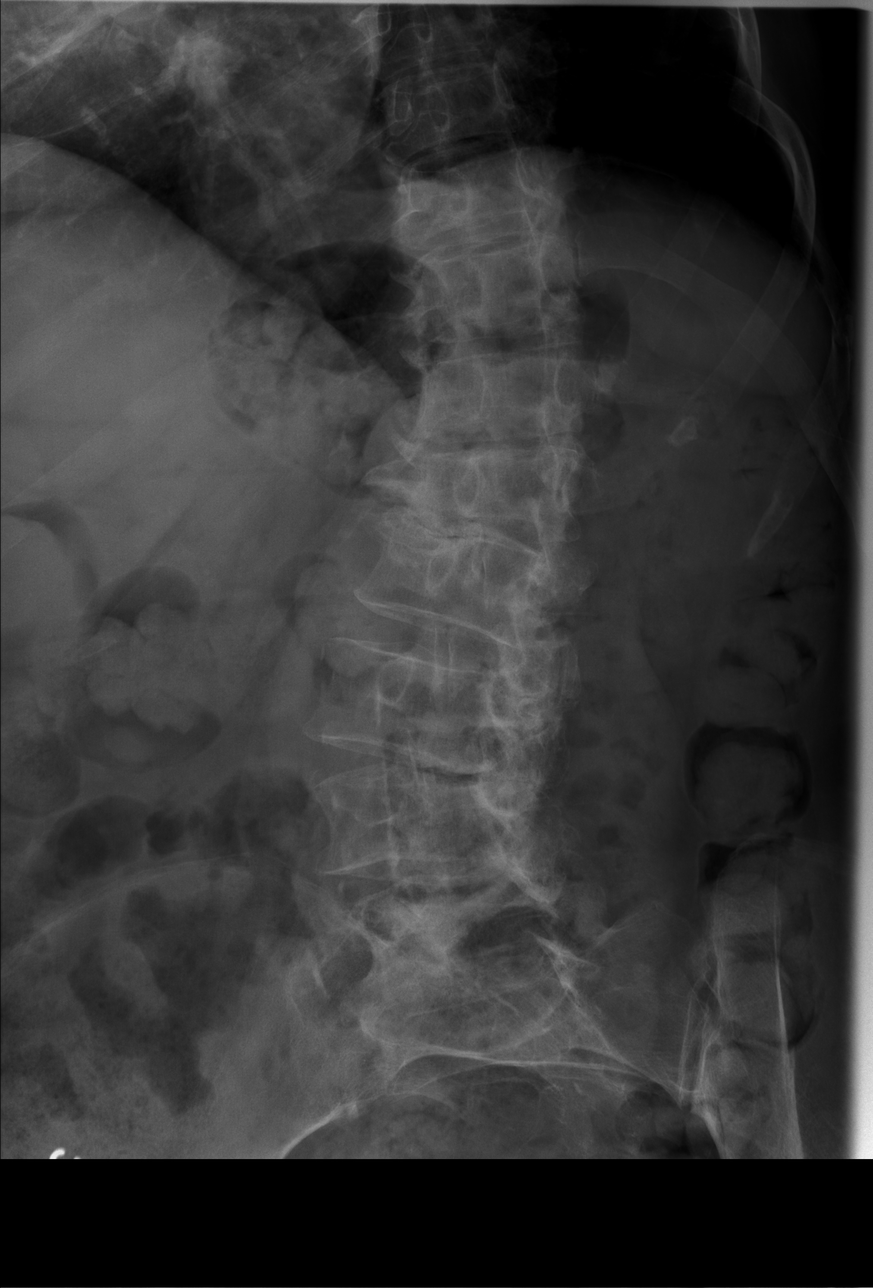

[t lumbar spine lat (1 of 2)]
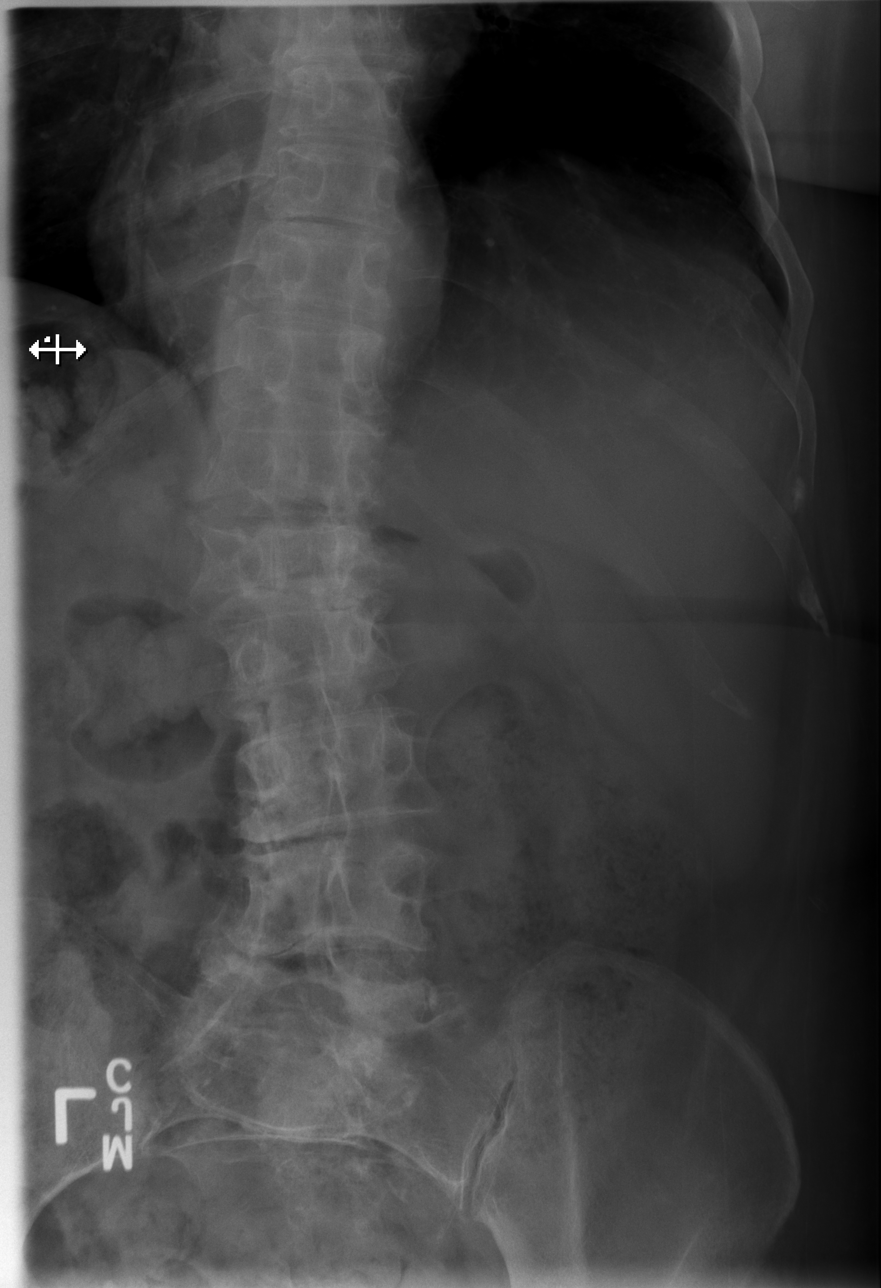

[t lumbar spine lat (2 of 2)]
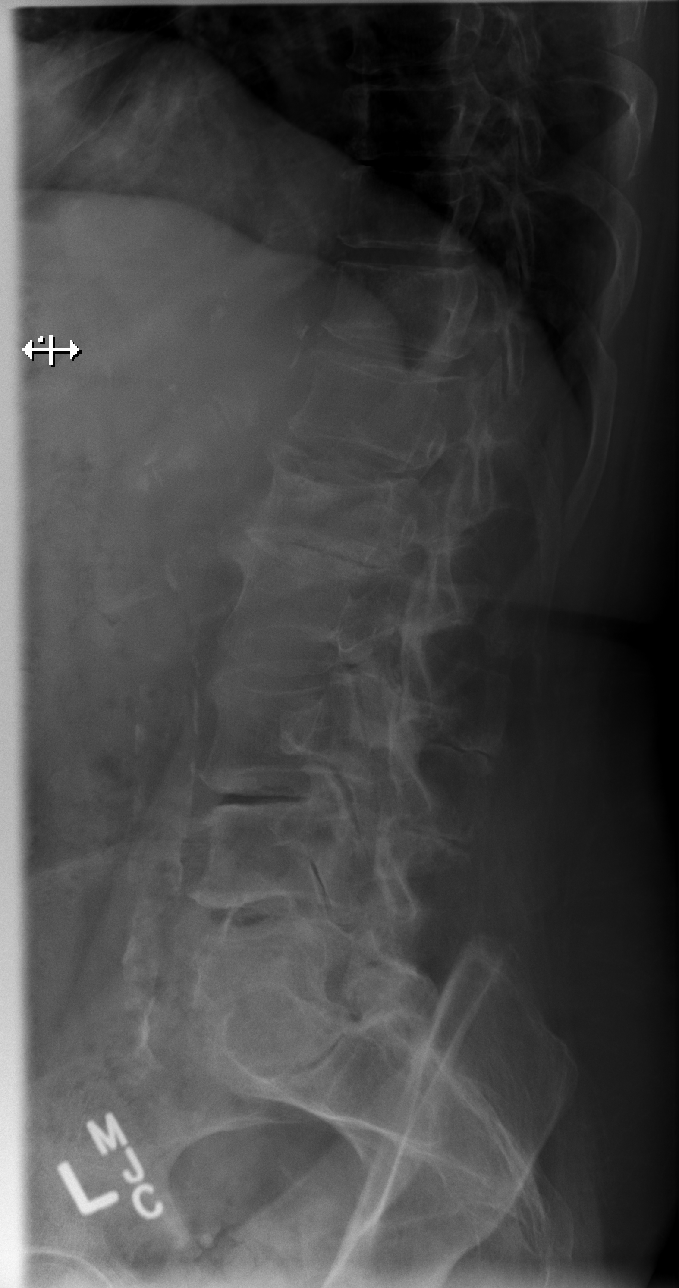

[t lumbar l-5 s-1 spot]
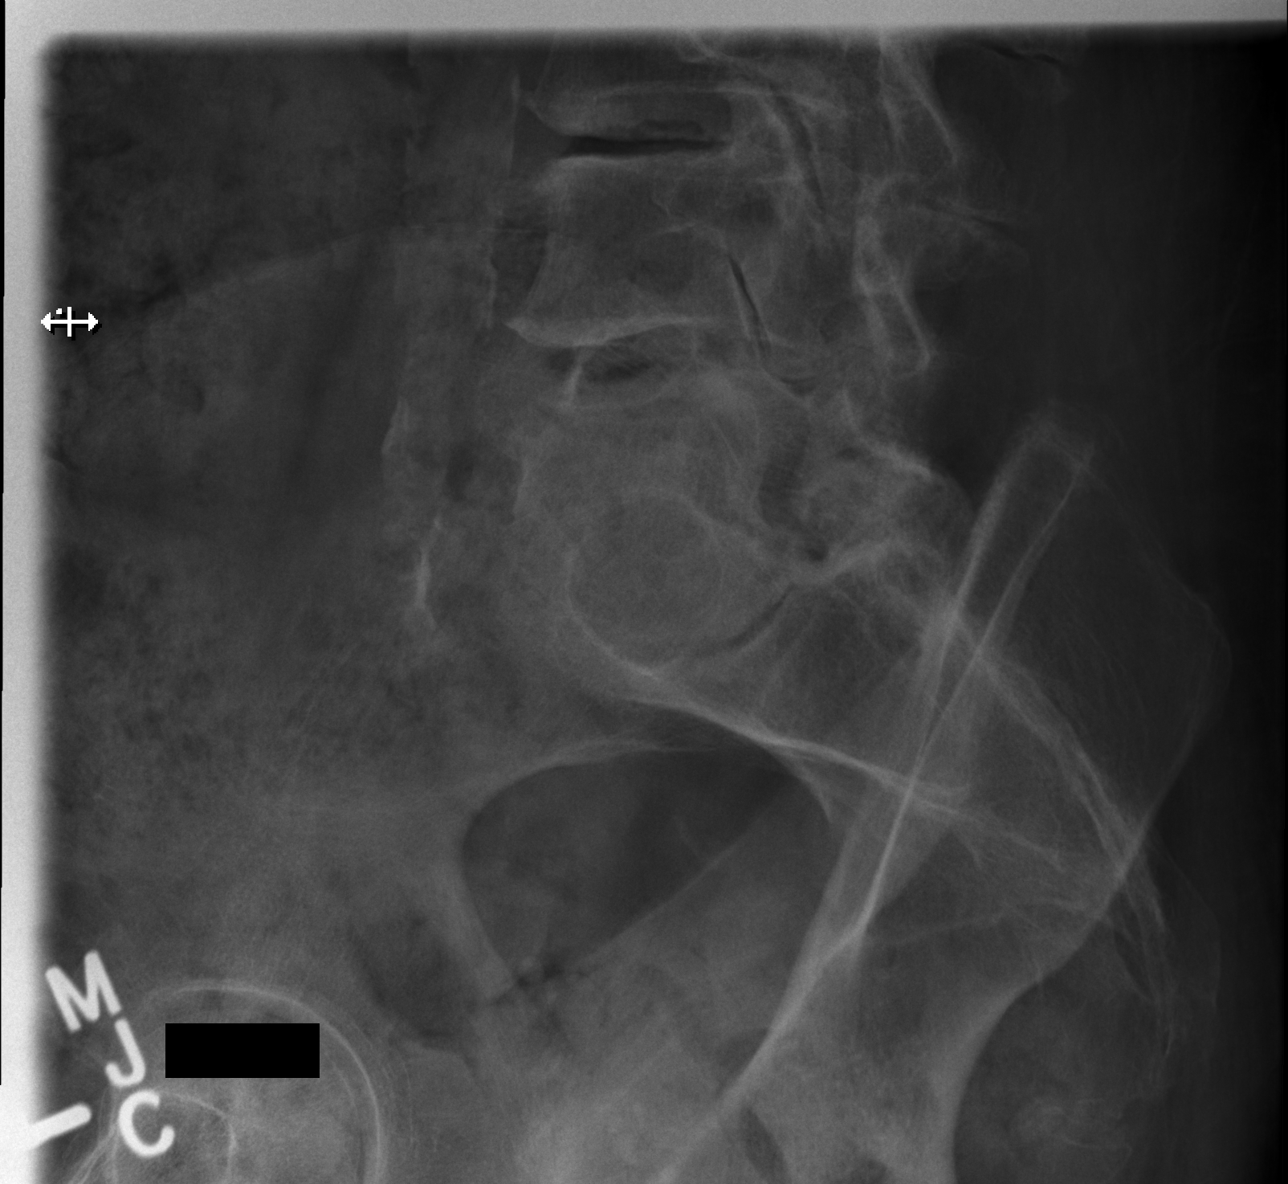

[5 of 5 positions shown; findings below may reference images not displayed]

FINDINGS: No definite acute fracture or subluxation. There is scoliosis of the
lumbar spine. Extensive multilevel degenerative changes with facet
hypertrophy. There is L1 compression fracture with approximately
40-50% loss of vertebral body height, age indeterminate, likely
chronic. Multilevel endplate sclerosis with disc space narrowing
noted.
IMPRESSION: No definite acute fracture or subluxation.

Extensive degenerative changes.

## 2016-07-27 ENCOUNTER — Other Ambulatory Visit (HOSPITAL_BASED_OUTPATIENT_CLINIC_OR_DEPARTMENT_OTHER): Payer: Medicare Other

## 2016-07-27 ENCOUNTER — Ambulatory Visit (HOSPITAL_COMMUNITY)
Admission: RE | Admit: 2016-07-27 | Discharge: 2016-07-27 | Disposition: A | Discharge status: Home / Self Care | Payer: Medicare Other | Source: Ambulatory Visit | Attending: Hematology and Oncology | Admitting: Hematology and Oncology

## 2016-07-27 ENCOUNTER — Ambulatory Visit (HOSPITAL_BASED_OUTPATIENT_CLINIC_OR_DEPARTMENT_OTHER): Payer: Medicare Other

## 2016-07-27 VITALS — BP 114/49 | HR 64 | Temp 98.2°F | Resp 20

## 2016-07-27 DIAGNOSIS — D500 Iron deficiency anemia secondary to blood loss (chronic): Secondary | ICD-10-CM | POA: Insufficient documentation

## 2016-07-27 DIAGNOSIS — K31819 Angiodysplasia of stomach and duodenum without bleeding: Secondary | ICD-10-CM

## 2016-07-27 DIAGNOSIS — D539 Nutritional anemia, unspecified: Secondary | ICD-10-CM

## 2016-07-27 DIAGNOSIS — K31811 Angiodysplasia of stomach and duodenum with bleeding: Secondary | ICD-10-CM

## 2016-07-27 DIAGNOSIS — D508 Other iron deficiency anemias: Secondary | ICD-10-CM

## 2016-07-27 LAB — CBC & DIFF AND RETIC
BASO%: 0.2 % (ref 0.0–2.0)
Basophils Absolute: 0 10*3/uL (ref 0.0–0.1)
EOS%: 1.6 % (ref 0.0–7.0)
Eosinophils Absolute: 0.1 10*3/uL (ref 0.0–0.5)
HCT: 23.9 % — ABNORMAL LOW (ref 34.8–46.6)
HGB: 7 g/dL — ABNORMAL LOW (ref 11.6–15.9)
Immature Retic Fract: 15.4 % — ABNORMAL HIGH (ref 1.60–10.00)
LYMPH#: 0.5 10*3/uL — AB (ref 0.9–3.3)
LYMPH%: 10.2 % — AB (ref 14.0–49.7)
MCH: 29.3 pg (ref 25.1–34.0)
MCHC: 29.3 g/dL — AB (ref 31.5–36.0)
MCV: 100 fL (ref 79.5–101.0)
MONO#: 0.4 10*3/uL (ref 0.1–0.9)
MONO%: 8.4 % (ref 0.0–14.0)
NEUT#: 3.9 10*3/uL (ref 1.5–6.5)
NEUT%: 79.6 % — AB (ref 38.4–76.8)
PLATELETS: 222 10*3/uL (ref 145–400)
RBC: 2.39 10*6/uL — AB (ref 3.70–5.45)
RDW: 14.7 % — ABNORMAL HIGH (ref 11.2–14.5)
Retic %: 3.9 % — ABNORMAL HIGH (ref 0.70–2.10)
Retic Ct Abs: 93.21 10*3/uL — ABNORMAL HIGH (ref 33.70–90.70)
WBC: 4.9 10*3/uL (ref 3.9–10.3)

## 2016-07-27 LAB — PREPARE RBC (CROSSMATCH)

## 2016-07-27 LAB — FERRITIN: Ferritin: 45 ng/ml (ref 9–269)

## 2016-07-27 LAB — TECHNOLOGIST REVIEW

## 2016-07-27 MED ORDER — SODIUM CHLORIDE 0.9 % IV SOLN
250.0000 mL | Freq: Once | INTRAVENOUS | Status: AC
  Administered 2016-07-27: 250 mL via INTRAVENOUS

## 2016-07-27 MED ORDER — DIPHENHYDRAMINE HCL 25 MG PO CAPS
25.0000 mg | ORAL_CAPSULE | Freq: Once | ORAL | Status: AC
  Administered 2016-07-27: 25 mg via ORAL

## 2016-07-27 MED ORDER — SODIUM CHLORIDE 0.9 % IV SOLN
510.0000 mg | Freq: Once | INTRAVENOUS | Status: AC
  Administered 2016-07-27: 510 mg via INTRAVENOUS
  Filled 2016-07-27: qty 17

## 2016-07-27 MED ORDER — HEPARIN SOD (PORK) LOCK FLUSH 100 UNIT/ML IV SOLN
250.0000 [IU] | INTRAVENOUS | Status: DC | PRN
  Filled 2016-07-27: qty 5

## 2016-07-27 MED ORDER — SODIUM CHLORIDE 0.9% FLUSH
3.0000 mL | INTRAVENOUS | Status: DC | PRN
  Filled 2016-07-27: qty 10

## 2016-07-27 MED ORDER — ACETAMINOPHEN 325 MG PO TABS
ORAL_TABLET | ORAL | Status: AC
  Filled 2016-07-27: qty 2

## 2016-07-27 MED ORDER — HEPARIN SOD (PORK) LOCK FLUSH 100 UNIT/ML IV SOLN
500.0000 [IU] | Freq: Every day | INTRAVENOUS | Status: DC | PRN
  Filled 2016-07-27: qty 5

## 2016-07-27 MED ORDER — SODIUM CHLORIDE 0.9% FLUSH
10.0000 mL | INTRAVENOUS | Status: DC | PRN
  Filled 2016-07-27: qty 10

## 2016-07-27 MED ORDER — DIPHENHYDRAMINE HCL 25 MG PO CAPS
ORAL_CAPSULE | ORAL | Status: AC
  Filled 2016-07-27: qty 2

## 2016-07-27 MED ORDER — ACETAMINOPHEN 325 MG PO TABS
650.0000 mg | ORAL_TABLET | Freq: Once | ORAL | Status: AC
Start: 2016-07-27 — End: 2016-07-27
  Administered 2016-07-27: 650 mg via ORAL

## 2016-07-27 NOTE — Progress Notes (Signed)
Pt will get 1 unit PRBCs today and one unit tomorrow, 6/23.  Orders have been placed under signed and held

## 2016-07-27 NOTE — Patient Instructions (Signed)
Ferumoxytol injection What is this medicine? FERUMOXYTOL is an iron complex. Iron is used to make healthy red blood cells, which carry oxygen and nutrients throughout the body. This medicine is used to treat iron deficiency anemia in people with chronic kidney disease. This medicine may be used for other purposes; ask your health care provider or pharmacist if you have questions. COMMON BRAND NAME(S): Feraheme What should I tell my health care provider before I take this medicine? They need to know if you have any of these conditions: -anemia not caused by low iron levels -high levels of iron in the blood -magnetic resonance imaging (MRI) test scheduled -an unusual or allergic reaction to iron, other medicines, foods, dyes, or preservatives -pregnant or trying to get pregnant -breast-feeding How should I use this medicine? This medicine is for injection into a vein. It is given by a health care professional in a hospital or clinic setting. Talk to your pediatrician regarding the use of this medicine in children. Special care may be needed. Overdosage: If you think you have taken too much of this medicine contact a poison control center or emergency room at once. NOTE: This medicine is only for you. Do not share this medicine with others. What if I miss a dose? It is important not to miss your dose. Call your doctor or health care professional if you are unable to keep an appointment. What may interact with this medicine? This medicine may interact with the following medications: -other iron products This list may not describe all possible interactions. Give your health care provider a list of all the medicines, herbs, non-prescription drugs, or dietary supplements you use. Also tell them if you smoke, drink alcohol, or use illegal drugs. Some items may interact with your medicine. What should I watch for while using this medicine? Visit your doctor or healthcare professional regularly. Tell  your doctor or healthcare professional if your symptoms do not start to get better or if they get worse. You may need blood work done while you are taking this medicine. You may need to follow a special diet. Talk to your doctor. Foods that contain iron include: whole grains/cereals, dried fruits, beans, or peas, leafy green vegetables, and organ meats (liver, kidney). What side effects may I notice from receiving this medicine? Side effects that you should report to your doctor or health care professional as soon as possible: -allergic reactions like skin rash, itching or hives, swelling of the face, lips, or tongue -breathing problems -changes in blood pressure -feeling faint or lightheaded, falls -fever or chills -flushing, sweating, or hot feelings -swelling of the ankles or feet Side effects that usually do not require medical attention (report to your doctor or health care professional if they continue or are bothersome): -diarrhea -headache -nausea, vomiting -stomach pain This list may not describe all possible side effects. Call your doctor for medical advice about side effects. You may report side effects to FDA at 1-800-FDA-1088. Where should I keep my medicine? This drug is given in a hospital or clinic and will not be stored at home. NOTE: This sheet is a summary. It may not cover all possible information. If you have questions about this medicine, talk to your doctor, pharmacist, or health care provider.  2018 Elsevier/Gold Standard (2015-02-24 12:41:49)    Blood Transfusion, Adult A blood transfusion is a procedure in which you receive donated blood, including plasma, platelets, and red blood cells, through an IV tube. You may need a blood transfusion because  of illness, surgery, or injury. The blood may come from a donor. You may also be able to donate blood for yourself (autologous blood donation) before a surgery if you know that you might require a blood transfusion. The  blood given in a transfusion is made up of different types of cells. You may receive:  Red blood cells. These carry oxygen to the cells in the body.  White blood cells. These help you fight infections.  Platelets. These help your blood to clot.  Plasma. This is the liquid part of your blood and it helps with fluid imbalances.  If you have hemophilia or another clotting disorder, you may also receive other types of blood products. Tell a health care provider about:  Any allergies you have.  All medicines you are taking, including vitamins, herbs, eye drops, creams, and over-the-counter medicines.  Any problems you or family members have had with anesthetic medicines.  Any blood disorders you have.  Any surgeries you have had.  Any medical conditions you have, including any recent fever or cold symptoms.  Whether you are pregnant or may be pregnant.  Any previous reactions you have had during a blood transfusion. What are the risks? Generally, this is a safe procedure. However, problems may occur, including:  Having an allergic reaction to something in the donated blood. Hives and itching may be symptoms of this type of reaction.  Fever. This may be a reaction to the white blood cells in the transfused blood. Nausea or chest pain may accompany a fever.  Iron overload. This can happen from having many transfusions.  Transfusion-related acute lung injury (TRALI). This is a rare reaction that causes lung damage. The cause is not known.TRALI can occur within hours of a transfusion or several days later.  Sudden (acute) or delayed hemolytic reactions. This happens if your blood does not match the cells in your transfusion. Your body's defense system (immune system) may try to attack the new cells. This complication is rare. The symptoms include fever, chills, nausea, and low back pain or chest pain.  Infection or disease transmission. This is rare.  What happens before the  procedure?  You will have a blood test to determine your blood type. This is necessary to know what kind of blood your body will accept and to match it to the donor blood.  If you are going to have a planned surgery, you may be able to do an autologous blood donation. This may be done in case you need to have a transfusion.  If you have had an allergic reaction to a transfusion in the past, you may be given medicine to help prevent a reaction. This medicine may be given to you by mouth or through an IV tube.  You will have your temperature, blood pressure, and pulse monitored before the transfusion.  Follow instructions from your health care provider about eating and drinking restrictions.  Ask your health care provider about: ? Changing or stopping your regular medicines. This is especially important if you are taking diabetes medicines or blood thinners. ? Taking medicines such as aspirin and ibuprofen. These medicines can thin your blood. Do not take these medicines before your procedure if your health care provider instructs you not to. What happens during the procedure?  An IV tube will be inserted into one of your veins.  The bag of donated blood will be attached to your IV tube. The blood will then enter through your vein.  Your temperature, blood  pressure, and pulse will be monitored regularly during the transfusion. This monitoring is done to detect early signs of a transfusion reaction.  If you have any signs or symptoms of a reaction, your transfusion will be stopped and you may be given medicine.  When the transfusion is complete, your IV tube will be removed.  Pressure may be applied to the IV site for a few minutes.  A bandage (dressing) will be applied. The procedure may vary among health care providers and hospitals. What happens after the procedure?  Your temperature, blood pressure, heart rate, breathing rate, and blood oxygen level will be monitored often.  Your  blood may be tested to see how you are responding to the transfusion.  You may be warmed with fluids or blankets to maintain a normal body temperature. Summary  A blood transfusion is a procedure in which you receive donated blood, including plasma, platelets, and red blood cells, through an IV tube.  Your temperature, blood pressure, and pulse will be monitored before, during, and after the transfusion.  Your blood may be tested after the transfusion to see how your body has responded. This information is not intended to replace advice given to you by your health care provider. Make sure you discuss any questions you have with your health care provider. Document Released: 01/20/2000 Document Revised: 10/20/2015 Document Reviewed: 10/20/2015 Elsevier Interactive Patient Education  [employer].

## 2016-07-28 ENCOUNTER — Ambulatory Visit: Payer: Medicare Other

## 2016-07-28 DIAGNOSIS — D500 Iron deficiency anemia secondary to blood loss (chronic): Secondary | ICD-10-CM | POA: Diagnosis not present

## 2016-07-28 LAB — PREPARE RBC (CROSSMATCH)

## 2016-07-28 MED ORDER — SODIUM CHLORIDE 0.9 % IV SOLN
250.0000 mL | Freq: Once | INTRAVENOUS | Status: AC
  Administered 2016-07-28: 250 mL via INTRAVENOUS

## 2016-07-28 MED ORDER — ACETAMINOPHEN 325 MG PO TABS
ORAL_TABLET | ORAL | Status: AC
  Filled 2016-07-28: qty 2

## 2016-07-28 MED ORDER — ACETAMINOPHEN 325 MG PO TABS
650.0000 mg | ORAL_TABLET | Freq: Once | ORAL | Status: AC
  Administered 2016-07-28: 650 mg via ORAL

## 2016-07-28 MED ORDER — DIPHENHYDRAMINE HCL 25 MG PO CAPS: 25.0000 mg | ORAL_CAPSULE | Freq: Once | ORAL | Status: DC

## 2016-07-28 NOTE — Patient Instructions (Signed)
Blood Transfusion A blood transfusion is a procedure in which you are given blood through an IV tube. You may need this procedure because of:  Illness.  Surgery.  Injury.  The blood may come from someone else (a donor). You may also be able to donate blood for yourself (autologous blood donation). The blood given in a transfusion is made up of different types of cells. You may get:  Red blood cells. These carry oxygen to the cells in the body.  White blood cells. These help you fight infections.  Platelets. These help your blood to clot.  Plasma. This is the liquid part of your blood. It helps with fluid imbalances.  If you have a clotting disorder, you may also get other types of blood products. What happens before the procedure?  You will have a blood test to find out your blood type. The test also finds out what type of blood your body will accept and matches it to the donor type.  If you are going to have a planned surgery, you may be able to donate your own blood. This may be done in case you need a transfusion.  If you have had an allergic reaction to a transfusion in the past, you may be given medicine to help prevent a reaction. This medicine may be given to you by mouth or through an IV.  You will have your temperature, blood pressure, and pulse checked.  Follow instructions from your doctor about what you cannot eat or drink.  Ask your doctor about: ? Changing or stopping your regular medicines. This is important if you take diabetes medicines or blood thinners. ? Taking medicines such as aspirin and ibuprofen. These medicines can thin your blood. Do not take these medicines before your procedure if your doctor tells you not to. What happens during the procedure?  An IV tube will be put into one of your veins.  The bag of donated blood will be attached to your IV tube. Then, the blood will enter through your vein.  Your temperature, blood pressure, and pulse will be  checked regularly during the procedure. This is done to find early signs of a transfusion reaction.  If you have any signs or symptoms of a reaction, your transfusion will be stopped. You may also be given medicine.  When the transfusion is done, your IV tube will be taken out.  Pressure may be applied to the IV site for a few minutes.  A bandage (dressing) will be put on the IV site. The procedure may vary among doctors and hospitals. What happens after the procedure?  Your temperature, blood pressure, heart rate, breathing rate, and blood oxygen level will be checked often.  Your blood may be tested to see how you are responding to the transfusion.  You may be warmed with fluids or blankets. This is done to keep the temperature of your body normal. Summary  A blood transfusion is a procedure in which you are given blood through an IV tube.  The blood may come from someone else (a donor). You may also be able to donate blood for yourself.  If you have had an allergic reaction to a transfusion in the past, you may be given medicine to help prevent a reaction. This medicine may be given to you by mouth or through an IV tube.  Your temperature, blood pressure, heart rate, breathing rate, and blood oxygen level will be checked often.  Your blood may be tested to   see how you are responding to the transfusion. This information is not intended to replace advice given to you by your health care provider. Make sure you discuss any questions you have with your health care provider. Document Released: 04/20/2008 Document Revised: 09/16/2015 Document Reviewed: 09/16/2015 Elsevier Interactive Patient Education  2017 Elsevier Inc.  

## 2016-07-30 ENCOUNTER — Other Ambulatory Visit: Payer: Self-pay | Admitting: Hematology and Oncology

## 2016-07-30 ENCOUNTER — Telehealth: Payer: Self-pay

## 2016-07-30 ENCOUNTER — Telehealth: Payer: Self-pay | Admitting: Hematology and Oncology

## 2016-07-30 DIAGNOSIS — D509 Iron deficiency anemia, unspecified: Secondary | ICD-10-CM

## 2016-07-30 DIAGNOSIS — D539 Nutritional anemia, unspecified: Secondary | ICD-10-CM

## 2016-07-30 LAB — TYPE AND SCREEN
ABO/RH(D): O POS
Antibody Screen: NEGATIVE
UNIT DIVISION: 0
Unit division: 0

## 2016-07-30 LAB — BPAM RBC
BLOOD PRODUCT EXPIRATION DATE: 201807112359
BLOOD PRODUCT EXPIRATION DATE: 201807172359
ISSUE DATE / TIME: 201806221609
ISSUE DATE / TIME: 201806230940
UNIT TYPE AND RH: 5100
Unit Type and Rh: 5100

## 2016-07-30 NOTE — Telephone Encounter (Signed)
-----   Message from [name], MD sent at 07/30/2016  6:30 AM EDT ----- Regarding: repeat labs [facility],  She received blood tx recently. Can you call and ask if she can come in for repeat labs only on 7/6?  If yes, please send scheduling msg for labs only appt on 7/6   Thanks

## 2016-07-30 NOTE — Telephone Encounter (Signed)
Called and spoke with patient and son, they will come in for lab only 7-6.

## 2016-07-30 NOTE — Telephone Encounter (Signed)
sw pt son to confirm 7/6 lab appt at 1400 per sch msg

## 2016-08-10 ENCOUNTER — Other Ambulatory Visit (HOSPITAL_BASED_OUTPATIENT_CLINIC_OR_DEPARTMENT_OTHER): Payer: Medicare Other

## 2016-08-10 DIAGNOSIS — D509 Iron deficiency anemia, unspecified: Secondary | ICD-10-CM | POA: Diagnosis not present

## 2016-08-10 DIAGNOSIS — D539 Nutritional anemia, unspecified: Secondary | ICD-10-CM | POA: Diagnosis not present

## 2016-08-10 DIAGNOSIS — K31819 Angiodysplasia of stomach and duodenum without bleeding: Secondary | ICD-10-CM | POA: Diagnosis present

## 2016-08-10 DIAGNOSIS — D500 Iron deficiency anemia secondary to blood loss (chronic): Secondary | ICD-10-CM

## 2016-08-10 LAB — CBC & DIFF AND RETIC
BASO%: 0.2 % (ref 0.0–2.0)
Basophils Absolute: 0 10*3/uL (ref 0.0–0.1)
EOS%: 2.1 % (ref 0.0–7.0)
Eosinophils Absolute: 0.1 10*3/uL (ref 0.0–0.5)
HCT: 30.8 % — ABNORMAL LOW (ref 34.8–46.6)
HGB: 9.5 g/dL — ABNORMAL LOW (ref 11.6–15.9)
IMMATURE RETIC FRACT: 17.1 % — AB (ref 1.60–10.00)
LYMPH%: 11.6 % — AB (ref 14.0–49.7)
MCH: 30.5 pg (ref 25.1–34.0)
MCHC: 30.8 g/dL — ABNORMAL LOW (ref 31.5–36.0)
MCV: 99 fL (ref 79.5–101.0)
MONO#: 0.4 10*3/uL (ref 0.1–0.9)
MONO%: 8 % (ref 0.0–14.0)
NEUT%: 78.1 % — ABNORMAL HIGH (ref 38.4–76.8)
NEUTROS ABS: 3.7 10*3/uL (ref 1.5–6.5)
NRBC: 0 % (ref 0–0)
PLATELETS: 260 10*3/uL (ref 145–400)
RBC: 3.11 10*6/uL — AB (ref 3.70–5.45)
RDW: 17.8 % — ABNORMAL HIGH (ref 11.2–14.5)
RETIC %: 4.58 % — AB (ref 0.70–2.10)
RETIC CT ABS: 142.44 10*3/uL — AB (ref 33.70–90.70)
WBC: 4.8 10*3/uL (ref 3.9–10.3)
lymph#: 0.6 10*3/uL — ABNORMAL LOW (ref 0.9–3.3)

## 2016-08-10 LAB — IRON AND TIBC
%SAT: 16 % — ABNORMAL LOW (ref 21–57)
IRON: 47 ug/dL (ref 41–142)
TIBC: 299 ug/dL (ref 236–444)
UIBC: 252 ug/dL (ref 120–384)

## 2016-08-10 LAB — FERRITIN: FERRITIN: 199 ng/mL (ref 9–269)

## 2016-08-11 LAB — VITAMIN B12: Vitamin B12: 492 pg/mL (ref 232–1245)

## 2016-08-15 ENCOUNTER — Telehealth: Payer: Self-pay | Admitting: Hematology and Oncology

## 2016-08-15 NOTE — Telephone Encounter (Signed)
sw pt son to confirm 7/13 infusion appt per sch msg

## 2016-08-17 ENCOUNTER — Ambulatory Visit (HOSPITAL_BASED_OUTPATIENT_CLINIC_OR_DEPARTMENT_OTHER): Payer: Medicare Other

## 2016-08-17 VITALS — BP 128/62 | HR 74 | Temp 99.1°F | Resp 17

## 2016-08-17 DIAGNOSIS — D539 Nutritional anemia, unspecified: Secondary | ICD-10-CM

## 2016-08-17 DIAGNOSIS — K31819 Angiodysplasia of stomach and duodenum without bleeding: Secondary | ICD-10-CM

## 2016-08-17 DIAGNOSIS — D500 Iron deficiency anemia secondary to blood loss (chronic): Secondary | ICD-10-CM | POA: Diagnosis present

## 2016-08-17 DIAGNOSIS — D508 Other iron deficiency anemias: Secondary | ICD-10-CM

## 2016-08-17 DIAGNOSIS — K31811 Angiodysplasia of stomach and duodenum with bleeding: Secondary | ICD-10-CM | POA: Diagnosis not present

## 2016-08-17 MED ORDER — SODIUM CHLORIDE 0.9 % IV SOLN
510.0000 mg | Freq: Once | INTRAVENOUS | Status: AC
  Administered 2016-08-17: 510 mg via INTRAVENOUS
  Filled 2016-08-17: qty 17

## 2016-08-17 NOTE — Patient Instructions (Signed)

## 2016-09-06 ENCOUNTER — Other Ambulatory Visit: Payer: Self-pay | Admitting: *Deleted

## 2016-09-06 DIAGNOSIS — D539 Nutritional anemia, unspecified: Secondary | ICD-10-CM

## 2016-09-07 ENCOUNTER — Ambulatory Visit: Payer: Medicare Other

## 2016-09-07 ENCOUNTER — Other Ambulatory Visit (HOSPITAL_BASED_OUTPATIENT_CLINIC_OR_DEPARTMENT_OTHER): Payer: Medicare Other

## 2016-09-07 DIAGNOSIS — D539 Nutritional anemia, unspecified: Secondary | ICD-10-CM

## 2016-09-07 DIAGNOSIS — K31811 Angiodysplasia of stomach and duodenum with bleeding: Secondary | ICD-10-CM | POA: Diagnosis not present

## 2016-09-07 DIAGNOSIS — D500 Iron deficiency anemia secondary to blood loss (chronic): Secondary | ICD-10-CM | POA: Diagnosis present

## 2016-09-07 LAB — CBC WITH DIFFERENTIAL/PLATELET
BASO%: 0.2 % (ref 0.0–2.0)
BASOS ABS: 0 10*3/uL (ref 0.0–0.1)
EOS%: 1.5 % (ref 0.0–7.0)
Eosinophils Absolute: 0.1 10*3/uL (ref 0.0–0.5)
HEMATOCRIT: 35.4 % (ref 34.8–46.6)
HEMOGLOBIN: 11 g/dL — AB (ref 11.6–15.9)
LYMPH%: 13.5 % — ABNORMAL LOW (ref 14.0–49.7)
MCH: 30.9 pg (ref 25.1–34.0)
MCHC: 31.1 g/dL — ABNORMAL LOW (ref 31.5–36.0)
MCV: 99.4 fL (ref 79.5–101.0)
MONO#: 0.3 10*3/uL (ref 0.1–0.9)
MONO%: 7 % (ref 0.0–14.0)
NEUT#: 3.7 10*3/uL (ref 1.5–6.5)
NEUT%: 77.8 % — ABNORMAL HIGH (ref 38.4–76.8)
PLATELETS: 263 10*3/uL (ref 145–400)
RBC: 3.56 10*6/uL — ABNORMAL LOW (ref 3.70–5.45)
RDW: 15.4 % — AB (ref 11.2–14.5)
WBC: 4.7 10*3/uL (ref 3.9–10.3)
lymph#: 0.6 10*3/uL — ABNORMAL LOW (ref 0.9–3.3)

## 2016-09-07 LAB — FERRITIN: FERRITIN: 156 ng/mL (ref 9–269)

## 2016-09-07 NOTE — Progress Notes (Signed)
Per Dr Alvy Bimler no feraheme today. Notified pt and son to keep scheduled appts and we will continue to monitor. Pt and son verbalized understanding.

## 2016-10-16 DIAGNOSIS — K219 Gastro-esophageal reflux disease without esophagitis: Secondary | ICD-10-CM | POA: Diagnosis not present

## 2016-10-16 DIAGNOSIS — F419 Anxiety disorder, unspecified: Secondary | ICD-10-CM | POA: Diagnosis not present

## 2016-10-16 DIAGNOSIS — E559 Vitamin D deficiency, unspecified: Secondary | ICD-10-CM | POA: Diagnosis not present

## 2016-10-16 DIAGNOSIS — Z23 Encounter for immunization: Secondary | ICD-10-CM | POA: Diagnosis not present

## 2016-10-16 DIAGNOSIS — D649 Anemia, unspecified: Secondary | ICD-10-CM | POA: Diagnosis not present

## 2016-10-16 DIAGNOSIS — I10 Essential (primary) hypertension: Secondary | ICD-10-CM | POA: Diagnosis not present

## 2016-10-17 ENCOUNTER — Telehealth: Payer: Self-pay | Admitting: *Deleted

## 2016-10-17 NOTE — Telephone Encounter (Signed)
Received vm call from pt's son stating that pt has a lab appt on Friday & she had labs done at PCP yest & hgb was 9.8 which is down some.  They are concerned about the weather.  They requested that labs be faxed to Dr Alvy Bimler.  He asked for return call to see if she really has to come back in.  Message to Dr Marlinda Mike RN

## 2016-10-17 NOTE — Telephone Encounter (Signed)
Notified that she will need to keep appt on Friday for iron. Counts are low

## 2016-10-19 ENCOUNTER — Other Ambulatory Visit (HOSPITAL_BASED_OUTPATIENT_CLINIC_OR_DEPARTMENT_OTHER): Payer: Medicare Other

## 2016-10-19 ENCOUNTER — Other Ambulatory Visit: Payer: Self-pay | Admitting: Hematology and Oncology

## 2016-10-19 ENCOUNTER — Other Ambulatory Visit: Payer: Self-pay | Admitting: Gastroenterology

## 2016-10-19 ENCOUNTER — Ambulatory Visit (HOSPITAL_BASED_OUTPATIENT_CLINIC_OR_DEPARTMENT_OTHER): Payer: Medicare Other

## 2016-10-19 VITALS — BP 125/77 | HR 80 | Temp 98.7°F | Resp 18

## 2016-10-19 DIAGNOSIS — D539 Nutritional anemia, unspecified: Secondary | ICD-10-CM

## 2016-10-19 DIAGNOSIS — K31819 Angiodysplasia of stomach and duodenum without bleeding: Secondary | ICD-10-CM

## 2016-10-19 DIAGNOSIS — D500 Iron deficiency anemia secondary to blood loss (chronic): Secondary | ICD-10-CM

## 2016-10-19 DIAGNOSIS — K31811 Angiodysplasia of stomach and duodenum with bleeding: Secondary | ICD-10-CM | POA: Diagnosis not present

## 2016-10-19 DIAGNOSIS — D509 Iron deficiency anemia, unspecified: Secondary | ICD-10-CM

## 2016-10-19 DIAGNOSIS — D508 Other iron deficiency anemias: Secondary | ICD-10-CM

## 2016-10-19 LAB — CBC WITH DIFFERENTIAL/PLATELET
BASO%: 0.5 % (ref 0.0–2.0)
Basophils Absolute: 0 10*3/uL (ref 0.0–0.1)
EOS ABS: 0.1 10*3/uL (ref 0.0–0.5)
EOS%: 1.9 % (ref 0.0–7.0)
HCT: 32.2 % — ABNORMAL LOW (ref 34.8–46.6)
HGB: 10.3 g/dL — ABNORMAL LOW (ref 11.6–15.9)
LYMPH%: 12.7 % — ABNORMAL LOW (ref 14.0–49.7)
MCH: 29.3 pg (ref 25.1–34.0)
MCHC: 32.1 g/dL (ref 31.5–36.0)
MCV: 91.3 fL (ref 79.5–101.0)
MONO#: 0.3 10*3/uL (ref 0.1–0.9)
MONO%: 8.1 % (ref 0.0–14.0)
NEUT%: 76.8 % (ref 38.4–76.8)
NEUTROS ABS: 3.3 10*3/uL (ref 1.5–6.5)
Platelets: 255 10*3/uL (ref 145–400)
RBC: 3.53 10*6/uL — ABNORMAL LOW (ref 3.70–5.45)
RDW: 15 % — AB (ref 11.2–14.5)
WBC: 4.3 10*3/uL (ref 3.9–10.3)
lymph#: 0.6 10*3/uL — ABNORMAL LOW (ref 0.9–3.3)

## 2016-10-19 LAB — FERRITIN: Ferritin: 27 ng/ml (ref 9–269)

## 2016-10-19 MED ORDER — SODIUM CHLORIDE 0.9 % IV SOLN
510.0000 mg | Freq: Once | INTRAVENOUS | Status: AC
  Administered 2016-10-19: 510 mg via INTRAVENOUS
  Filled 2016-10-19: qty 17

## 2016-10-19 NOTE — Progress Notes (Signed)
Per Dr. Alvy Bimler, okay to treat without iron labs resulted

## 2016-10-19 NOTE — Patient Instructions (Signed)

## 2016-11-30 ENCOUNTER — Other Ambulatory Visit (HOSPITAL_BASED_OUTPATIENT_CLINIC_OR_DEPARTMENT_OTHER): Payer: Medicare Other

## 2016-11-30 ENCOUNTER — Ambulatory Visit (HOSPITAL_BASED_OUTPATIENT_CLINIC_OR_DEPARTMENT_OTHER): Payer: Medicare Other

## 2016-11-30 VITALS — BP 131/74 | HR 76 | Temp 98.3°F | Resp 18

## 2016-11-30 DIAGNOSIS — D539 Nutritional anemia, unspecified: Secondary | ICD-10-CM

## 2016-11-30 DIAGNOSIS — D500 Iron deficiency anemia secondary to blood loss (chronic): Secondary | ICD-10-CM

## 2016-11-30 DIAGNOSIS — K31811 Angiodysplasia of stomach and duodenum with bleeding: Secondary | ICD-10-CM | POA: Diagnosis not present

## 2016-11-30 DIAGNOSIS — D508 Other iron deficiency anemias: Secondary | ICD-10-CM

## 2016-11-30 DIAGNOSIS — K31819 Angiodysplasia of stomach and duodenum without bleeding: Secondary | ICD-10-CM

## 2016-11-30 DIAGNOSIS — D509 Iron deficiency anemia, unspecified: Secondary | ICD-10-CM

## 2016-11-30 LAB — CBC WITH DIFFERENTIAL/PLATELET
BASO%: 0.2 % (ref 0.0–2.0)
BASOS ABS: 0 10*3/uL (ref 0.0–0.1)
EOS ABS: 0.1 10*3/uL (ref 0.0–0.5)
EOS%: 2 % (ref 0.0–7.0)
HCT: 29.9 % — ABNORMAL LOW (ref 34.8–46.6)
HGB: 9.3 g/dL — ABNORMAL LOW (ref 11.6–15.9)
LYMPH%: 14.3 % (ref 14.0–49.7)
MCH: 29.6 pg (ref 25.1–34.0)
MCHC: 31.1 g/dL — AB (ref 31.5–36.0)
MCV: 95.2 fL (ref 79.5–101.0)
MONO#: 0.3 10*3/uL (ref 0.1–0.9)
MONO%: 8.1 % (ref 0.0–14.0)
NEUT#: 3.1 10*3/uL (ref 1.5–6.5)
NEUT%: 75.4 % (ref 38.4–76.8)
Platelets: 238 10*3/uL (ref 145–400)
RBC: 3.14 10*6/uL — AB (ref 3.70–5.45)
RDW: 14.8 % — ABNORMAL HIGH (ref 11.2–14.5)
WBC: 4.1 10*3/uL (ref 3.9–10.3)
lymph#: 0.6 10*3/uL — ABNORMAL LOW (ref 0.9–3.3)

## 2016-11-30 LAB — FERRITIN: FERRITIN: 45 ng/mL (ref 9–269)

## 2016-11-30 MED ORDER — SODIUM CHLORIDE 0.9 % IV SOLN
510.0000 mg | Freq: Once | INTRAVENOUS | Status: AC
  Administered 2016-11-30: 510 mg via INTRAVENOUS
  Filled 2016-11-30: qty 17

## 2016-11-30 NOTE — Patient Instructions (Signed)

## 2017-01-11 ENCOUNTER — Encounter: Payer: Self-pay | Admitting: Hematology and Oncology

## 2017-01-11 ENCOUNTER — Ambulatory Visit (HOSPITAL_BASED_OUTPATIENT_CLINIC_OR_DEPARTMENT_OTHER): Payer: Medicare Other

## 2017-01-11 ENCOUNTER — Telehealth: Payer: Self-pay | Admitting: Hematology and Oncology

## 2017-01-11 ENCOUNTER — Other Ambulatory Visit (HOSPITAL_BASED_OUTPATIENT_CLINIC_OR_DEPARTMENT_OTHER): Payer: Medicare Other

## 2017-01-11 ENCOUNTER — Ambulatory Visit (HOSPITAL_BASED_OUTPATIENT_CLINIC_OR_DEPARTMENT_OTHER): Payer: Medicare Other | Admitting: Hematology and Oncology

## 2017-01-11 ENCOUNTER — Ambulatory Visit (HOSPITAL_COMMUNITY)
Admission: RE | Admit: 2017-01-11 | Discharge: 2017-01-11 | Disposition: A | Discharge status: Home / Self Care | Payer: Medicare Other | Source: Ambulatory Visit | Attending: Hematology and Oncology | Admitting: Hematology and Oncology

## 2017-01-11 VITALS — BP 115/48 | HR 78 | Temp 98.5°F | Resp 24 | Ht 62.0 in

## 2017-01-11 VITALS — BP 117/54 | HR 65 | Temp 98.6°F | Resp 18

## 2017-01-11 DIAGNOSIS — D500 Iron deficiency anemia secondary to blood loss (chronic): Secondary | ICD-10-CM | POA: Diagnosis not present

## 2017-01-11 DIAGNOSIS — D539 Nutritional anemia, unspecified: Secondary | ICD-10-CM | POA: Diagnosis not present

## 2017-01-11 DIAGNOSIS — K31811 Angiodysplasia of stomach and duodenum with bleeding: Secondary | ICD-10-CM | POA: Diagnosis not present

## 2017-01-11 DIAGNOSIS — K31819 Angiodysplasia of stomach and duodenum without bleeding: Secondary | ICD-10-CM

## 2017-01-11 DIAGNOSIS — D509 Iron deficiency anemia, unspecified: Secondary | ICD-10-CM

## 2017-01-11 DIAGNOSIS — D508 Other iron deficiency anemias: Secondary | ICD-10-CM

## 2017-01-11 LAB — CBC WITH DIFFERENTIAL/PLATELET
BASO%: 0.2 % (ref 0.0–2.0)
BASOS ABS: 0 10*3/uL (ref 0.0–0.1)
EOS ABS: 0.1 10*3/uL (ref 0.0–0.5)
EOS%: 1.6 % (ref 0.0–7.0)
HEMATOCRIT: 25.9 % — AB (ref 34.8–46.6)
HGB: 7.9 g/dL — ABNORMAL LOW (ref 11.6–15.9)
LYMPH#: 0.5 10*3/uL — AB (ref 0.9–3.3)
LYMPH%: 12.2 % — ABNORMAL LOW (ref 14.0–49.7)
MCH: 28.9 pg (ref 25.1–34.0)
MCHC: 30.5 g/dL — AB (ref 31.5–36.0)
MCV: 94.9 fL (ref 79.5–101.0)
MONO#: 0.4 10*3/uL (ref 0.1–0.9)
MONO%: 8.9 % (ref 0.0–14.0)
NEUT#: 3.4 10*3/uL (ref 1.5–6.5)
NEUT%: 77.1 % — AB (ref 38.4–76.8)
PLATELETS: 239 10*3/uL (ref 145–400)
RBC: 2.73 10*6/uL — ABNORMAL LOW (ref 3.70–5.45)
RDW: 14.9 % — ABNORMAL HIGH (ref 11.2–14.5)
WBC: 4.4 10*3/uL (ref 3.9–10.3)

## 2017-01-11 LAB — PREPARE RBC (CROSSMATCH)

## 2017-01-11 LAB — FERRITIN: Ferritin: 34 ng/ml (ref 9–269)

## 2017-01-11 MED ORDER — SODIUM CHLORIDE 0.9 % IV SOLN
510.0000 mg | Freq: Once | INTRAVENOUS | Status: AC
  Administered 2017-01-11: 510 mg via INTRAVENOUS
  Filled 2017-01-11: qty 17

## 2017-01-11 NOTE — Progress Notes (Signed)
[facility] OFFICE PROGRESS NOTE  [name], MD SUMMARY OF HEMATOLOGIC HISTORY:  She was found to have abnormal CBC from recent blood count monitoring. The patient have chronic iron deficiency anemia due to recurrent GI bleed. 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 She received blood transfusion and iron treatment Since February 2018, she received intravenous iron every 6 weeks INTERVAL HISTORY: JOLETTA [name] 83 y.o. female returns for further follow-up Since we space out her intravenous iron infusion, she is becoming progressively anemic The patient denies any recent signs or symptoms of bleeding such as spontaneous epistaxis, hematuria or hematochezia. She denies chest pain or shortness of breath.  She complained of fatigue.  I have reviewed the past medical history, past surgical history, [name]; risedronate sodium; sertraline; fosamax [alendronate sodium]; lipitor [atorvastatin]; pregabalin; tolterodine tartrate; and wasp venom.  MEDICATIONS:  Current Outpatient Medications  Medication Sig Dispense Refill  . ALPRAZolam (XANAX) 0.25 MG tablet Take 0.25 mg by mouth 2 (two) times daily as needed for anxiety.    . Calcium Carb-Cholecalciferol (CALCIUM-VITAMIN D) 500-200 MG-UNIT tablet Take by mouth.    . cholecalciferol (VITAMIN D) 1000 units tablet Take 2,000 Units by mouth daily.    [name] denosumab (PROLIA) 60 MG/ML SOLN Inject 60 mg  into the skin every 6 (six) months.     . DEXILANT 60 MG capsule TAKE 1 TABLET BY MOUTH DAILY EVERY MORNING. 30 capsule 6  . dexlansoprazole (DEXILANT) 60 MG capsule Take 60 mg by mouth daily.    [name] EPINEPHrine (EPIPEN 2-PAK) 0.3 mg/0.3 mL IJ SOAJ injection Inject 0.3 mg into the muscle once as needed (for severe allergic reaction).    . ferumoxytol (FERAHEME) 510 MG/17ML SOLN injection Inject 510 mg into the vein as needed (for low iron).     . fluorometholone (FML) 0.1 % ophthalmic suspension Place 1 drop into the left eye daily.     . folic acid (FOLVITE) 353 MCG tablet Take 800 mcg by mouth daily.    [name] gabapentin (NEURONTIN) 400 MG capsule Take 400 mg by mouth 2 (two) times daily.     . hydroxypropyl methylcellulose / hypromellose (ISOPTO TEARS / GONIOVISC) 2.5 % ophthalmic solution Place 1-2 drops into the right eye as needed for dry eyes.     [name] sertraline (ZOLOFT) 100 MG tablet     . verapamil (CALAN-SR) 240 MG CR tablet Take 240 mg by mouth daily.     No current facility-administered medications for this visit.      REVIEW OF SYSTEMS:   Constitutional: Denies fevers, [name], nose, mouth, throat, [name], [name], [name], [name], [name], no new changes  All other systems were reviewed with  the patient and are negative.  PHYSICAL EXAMINATION: ECOG PERFORMANCE STATUS: 1 - Symptomatic but completely ambulatory  Vitals:   01/11/17 1347  BP: (!) 115/48  Pulse: 78  Resp: (!) 24  Temp: 98.5 F (36.9 C)  SpO2: 92%   There were no vitals filed for this  visit.  GENERAL:alert, no distress and comfortable. She looks pale SKIN: skin color, texture, turgor are normal, [name], [name], sclera clear OROPHARYNX:no exudate, no erythema and lips, buccal mucosa, and tongue normal  NECK: supple, thyroid normal size, non-tender, without nodularity LYMPH:  no palpable lymphadenopathy in the cervical, axillary or inguinal LUNGS: clear to auscultation and percussion with normal breathing effort HEART: regular rate & rhythm and no murmurs and no lower extremity edema ABDOMEN:abdomen soft, non-tender and normal bowel sounds Musculoskeletal:no cyanosis of digits and no clubbing  NEURO: alert & oriented x 3 with fluent speech, no focal motor/sensory deficits  LABORATORY DATA:  I have reviewed the data as listed     Component Value Date/Time   NA 139 03/23/2016 1448   K 4.0 03/23/2016 1448   CL 107 01/13/2016 0345   CO2 23 03/23/2016 1448   GLUCOSE 94 03/23/2016 1448   BUN 28.5 (H) 03/23/2016 1448   CREATININE 1.4 (H) 03/23/2016 1448   CALCIUM 10.7 (H) 03/23/2016 1448   PROT 6.6 03/23/2016 1448   ALBUMIN 3.7 03/23/2016 1448   AST 16 03/23/2016 1448   ALT 10 03/23/2016 1448   ALKPHOS 76 03/23/2016 1448   BILITOT 0.29 03/23/2016 1448   GFRNONAA 37 (L) 01/13/2016 0345   GFRAA 43 (L) 01/13/2016 0345    No results found for: SPEP, UPEP  Lab Results  Component Value Date   WBC 4.4 01/11/2017   NEUTROABS 3.4 01/11/2017   HGB 7.9 (L) 01/11/2017   HCT 25.9 (L) 01/11/2017   MCV 94.9 01/11/2017   PLT 239 01/11/2017      Chemistry      Component Value Date/Time   NA 139 03/23/2016 1448   K 4.0 03/23/2016 1448   CL 107 01/13/2016 0345   CO2 23 03/23/2016 1448   BUN 28.5 (H) 03/23/2016 1448   CREATININE 1.4 (H) 03/23/2016 1448      Component Value Date/Time   CALCIUM 10.7 (H) 03/23/2016 1448   ALKPHOS 76 03/23/2016 1448   AST 16 03/23/2016 1448   ALT 10 03/23/2016 1448    BILITOT 0.29 03/23/2016 1448       ASSESSMENT & PLAN:  Iron deficiency anemia due to chronic blood loss The patient likely had progressive GI bleed She is symptomatic with fatigue I would proceed with intravenous iron infusion today I plan to bring her back more frequently at least once every 4 weeks to get intravenous iron Given her age and her symptoms, I would also plan to give her blood transfusion tomorrow We discussed some of the risks, benefits, and alternatives of blood transfusions. The patient is symptomatic from anemia and the hemoglobin level is critically low.  Some of the side-effects to be expected including risks of transfusion reactions, chills, infection, syndrome of volume overload and risk of hospitalization from various reasons and the patient is willing to proceed and went ahead to sign consent today.   GAVE (gastric antral vascular ectasia) She had recurrent GI bleed from GAVE. She is on high-dose proton pump inhibitor for this. I will defer to gastroenterologist for further management.     Orders Placed This  Encounter  Procedures  . Hold Tube, Blood Bank    Standing Status:   Standing    Number of Occurrences:   9    Standing Expiration Date:   01/11/2018    All questions were answered. The patient knows to call the clinic with any problems, questions or concerns. No barriers to learning was detected.  I spent 15 minutes counseling the patient face to face. The total time spent in the appointment was 20 minutes and more than 50% was on counseling.     [name], MD 12/7/20182:41 PM

## 2017-01-11 NOTE — Patient Instructions (Signed)

## 2017-01-11 NOTE — Assessment & Plan Note (Signed)
The patient likely had progressive GI bleed She is symptomatic with fatigue I would proceed with intravenous iron infusion today I plan to bring her back more frequently at least once every 4 weeks to get intravenous iron Given her age and her symptoms, I would also plan to give her blood transfusion tomorrow We discussed some of the risks, benefits, and alternatives of blood transfusions. The patient is symptomatic from anemia and the hemoglobin level is critically low.  Some of the side-effects to be expected including risks of transfusion reactions, chills, infection, syndrome of volume overload and risk of hospitalization from various reasons and the patient is willing to proceed and went ahead to sign consent today.

## 2017-01-11 NOTE — Assessment & Plan Note (Signed)
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 She is on high-dose proton pump inhibitor for this. I will defer to gastroenterologist for further management.

## 2017-01-11 NOTE — Telephone Encounter (Signed)
Scheduled apt per 12/7 los - Gave patient AVS and calender per los.

## 2017-01-12 ENCOUNTER — Ambulatory Visit: Payer: Medicare Other

## 2017-01-12 DIAGNOSIS — D539 Nutritional anemia, unspecified: Secondary | ICD-10-CM

## 2017-01-12 MED ORDER — DIPHENHYDRAMINE HCL 25 MG PO CAPS
ORAL_CAPSULE | ORAL | Status: AC
  Filled 2017-01-12: qty 1

## 2017-01-12 MED ORDER — ACETAMINOPHEN 325 MG PO TABS
ORAL_TABLET | ORAL | Status: AC
  Filled 2017-01-12: qty 2

## 2017-01-12 MED ORDER — ACETAMINOPHEN 325 MG PO TABS
650.0000 mg | ORAL_TABLET | Freq: Once | ORAL | Status: AC
  Administered 2017-01-12: 650 mg via ORAL

## 2017-01-12 MED ORDER — SODIUM CHLORIDE 0.9 % IV SOLN
250.0000 mL | Freq: Once | INTRAVENOUS | Status: AC
  Administered 2017-01-12: 250 mL via INTRAVENOUS

## 2017-01-12 MED ORDER — DIPHENHYDRAMINE HCL 25 MG PO CAPS
25.0000 mg | ORAL_CAPSULE | Freq: Once | ORAL | Status: AC
  Administered 2017-01-12: 25 mg via ORAL

## 2017-01-12 NOTE — Patient Instructions (Signed)

## 2017-01-12 NOTE — Progress Notes (Signed)
Pt. Tolerated transfusion well, No problems or concerns noted, Pt. Left clinic alert, oriented, in a wheel chair accompanied by her son. Kelli Hope LPN

## 2017-01-14 LAB — TYPE AND SCREEN
ABO/RH(D): O POS
Antibody Screen: NEGATIVE
Unit division: 0

## 2017-01-14 LAB — BPAM RBC
BLOOD PRODUCT EXPIRATION DATE: 201901022359
ISSUE DATE / TIME: 201812080859
Unit Type and Rh: 5100

## 2017-01-16 ENCOUNTER — Other Ambulatory Visit: Payer: Self-pay

## 2017-01-16 ENCOUNTER — Encounter (HOSPITAL_COMMUNITY): Payer: Self-pay | Admitting: Family Medicine

## 2017-01-16 ENCOUNTER — Emergency Department (HOSPITAL_COMMUNITY): Payer: Medicare Other

## 2017-01-16 ENCOUNTER — Emergency Department (HOSPITAL_COMMUNITY)
Admission: EM | Admit: 2017-01-16 | Discharge: 2017-01-16 | Disposition: A | Discharge status: Home / Self Care | Payer: Medicare Other | Attending: Physician Assistant | Admitting: Physician Assistant

## 2017-01-16 DIAGNOSIS — R0789 Other chest pain: Secondary | ICD-10-CM

## 2017-01-16 DIAGNOSIS — B9789 Other viral agents as the cause of diseases classified elsewhere: Secondary | ICD-10-CM | POA: Insufficient documentation

## 2017-01-16 DIAGNOSIS — Z79899 Other long term (current) drug therapy: Secondary | ICD-10-CM | POA: Diagnosis not present

## 2017-01-16 DIAGNOSIS — J069 Acute upper respiratory infection, unspecified: Secondary | ICD-10-CM | POA: Diagnosis not present

## 2017-01-16 DIAGNOSIS — I10 Essential (primary) hypertension: Secondary | ICD-10-CM | POA: Insufficient documentation

## 2017-01-16 DIAGNOSIS — Z87891 Personal history of nicotine dependence: Secondary | ICD-10-CM | POA: Diagnosis not present

## 2017-01-16 DIAGNOSIS — J410 Simple chronic bronchitis: Secondary | ICD-10-CM | POA: Diagnosis not present

## 2017-01-16 DIAGNOSIS — R0602 Shortness of breath: Secondary | ICD-10-CM | POA: Diagnosis present

## 2017-01-16 DIAGNOSIS — R05 Cough: Secondary | ICD-10-CM | POA: Diagnosis not present

## 2017-01-16 LAB — CBC WITH DIFFERENTIAL/PLATELET
Basophils Absolute: 0 10*3/uL (ref 0.0–0.1)
Basophils Relative: 0 %
Eosinophils Absolute: 0.1 10*3/uL (ref 0.0–0.7)
Eosinophils Relative: 2 %
HCT: 28.2 % — ABNORMAL LOW (ref 36.0–46.0)
Hemoglobin: 8.8 g/dL — ABNORMAL LOW (ref 12.0–15.0)
Lymphocytes Relative: 15 %
Lymphs Abs: 0.8 10*3/uL (ref 0.7–4.0)
MCH: 29.3 pg (ref 26.0–34.0)
MCHC: 31.2 g/dL (ref 30.0–36.0)
MCV: 94 fL (ref 78.0–100.0)
Monocytes Absolute: 0.7 10*3/uL (ref 0.1–1.0)
Monocytes Relative: 13 %
Neutro Abs: 3.6 10*3/uL (ref 1.7–7.7)
Neutrophils Relative %: 70 %
Platelets: 210 10*3/uL (ref 150–400)
RBC: 3 MIL/uL — ABNORMAL LOW (ref 3.87–5.11)
RDW: 16.6 % — ABNORMAL HIGH (ref 11.5–15.5)
WBC: 5.2 10*3/uL (ref 4.0–10.5)

## 2017-01-16 LAB — BASIC METABOLIC PANEL
Anion gap: 8 (ref 5–15)
BUN: 24 mg/dL — ABNORMAL HIGH (ref 6–20)
CO2: 23 mmol/L (ref 22–32)
Calcium: 8.2 mg/dL — ABNORMAL LOW (ref 8.9–10.3)
Chloride: 105 mmol/L (ref 101–111)
Creatinine, Ser: 1.2 mg/dL — ABNORMAL HIGH (ref 0.44–1.00)
GFR calc Af Amer: 46 mL/min — ABNORMAL LOW (ref >60)
GFR calc non Af Amer: 40 mL/min — ABNORMAL LOW (ref >60)
Glucose, Bld: 102 mg/dL — ABNORMAL HIGH (ref 65–99)
Potassium: 4.6 mmol/L (ref 3.5–5.1)
Sodium: 136 mmol/L (ref 135–145)

## 2017-01-16 LAB — POC OCCULT BLOOD, ED: Fecal Occult Bld: POSITIVE — AB

## 2017-01-16 LAB — BRAIN NATRIURETIC PEPTIDE: B Natriuretic Peptide: 320 pg/mL — ABNORMAL HIGH (ref 0.0–100.0)

## 2017-01-16 MED ORDER — ALBUTEROL SULFATE HFA 108 (90 BASE) MCG/ACT IN AERS: 1.0000 | INHALATION_SPRAY | Freq: Four times a day (QID) | RESPIRATORY_TRACT | 0 refills | Status: DC | PRN

## 2017-01-16 MED ORDER — BENZONATATE 100 MG PO CAPS: 100.0000 mg | ORAL_CAPSULE | Freq: Three times a day (TID) | ORAL | 0 refills | Status: DC

## 2017-01-16 MED ORDER — ALBUTEROL SULFATE HFA 108 (90 BASE) MCG/ACT IN AERS
1.0000 | INHALATION_SPRAY | Freq: Once | RESPIRATORY_TRACT | Status: AC
  Administered 2017-01-16: 1 via RESPIRATORY_TRACT
  Filled 2017-01-16: qty 6.7

## 2017-01-16 MED ORDER — IPRATROPIUM-ALBUTEROL 0.5-2.5 (3) MG/3ML IN SOLN
3.0000 mL | Freq: Once | RESPIRATORY_TRACT | Status: AC
  Administered 2017-01-16: 3 mL via RESPIRATORY_TRACT
  Filled 2017-01-16: qty 3

## 2017-01-16 MED ORDER — PREDNISONE 20 MG PO TABS
60.0000 mg | ORAL_TABLET | Freq: Once | ORAL | Status: AC
  Administered 2017-01-16: 60 mg via ORAL
  Filled 2017-01-16: qty 3

## 2017-01-16 NOTE — ED Triage Notes (Signed)
Patient is complaining of right rib pain, productive cough, and chest tightness. Symptoms started on Saturday. Patient reports she had an iron transfusion on Friday and a blood transfusion on Saturday and was positioning awkwardly on Saturday. Patients respirations are regular, even, and unlabored.

## 2017-01-16 NOTE — ED Provider Notes (Signed)
[facility] DEPT Provider Note   CSN: [id number] Arrival date & time: 01/16/17  1152     History   Chief Complaint Chief Complaint  Patient presents with  . Shortness of Breath    HPI Sophia [name] is a 83 y.o. female with history of CHF, DVT, hypertension, hyperlipidemia, osteoporosis, GERD, aortic stenosis, iron deficiency anemia presents today with chief complaint acute onset, progressively worsening cough and right-sided chest pain.  Patient receives iron infusions every 3 weeks and on Friday was found to have a hemoglobin of 7 so also underwent blood transfusion.  She states that evening she began developing a cough productive of clear or light yellow sputum.  She also notes nasal congestion.  She denies shortness of breath but endorses some chest tightness and right lateral chest pain which does not radiate.  Notes it worsens with palpation and movement.  She has tried Mucinex for her symptoms with improvement.  Denies fevers or chills.  She does note generalized fatigue.  Denies headaches or lightheadedness.  No abdominal pain, nausea, vomiting, urinary symptoms, melena, or hematochezia. Denies recent history of trauma or falls but notes she wa slaying on her right side for her transfusion. She is a former smoker but quit several decades ago. She notes edema to her BLE but states this is chronic and unchanged.   The history is provided by the patient.    Past Medical History:  Diagnosis Date  . Anemia   . Anxiety   . Aortic stenosis   . Arthritis   . Blood transfusion   . Cataracts, bilateral    removed  . Congestive heart failure ([place])   . Depression   . Diverticulosis 03/14/10  . Duodenitis 03/15/10   per capsule endoscopy report  . DVT (deep venous thrombosis) ([place])    pt denies  . Dyslipidemia   . GAVE (gastric antral vascular ectasia)   . GERD (gastroesophageal reflux disease)   . Heart murmur   . Hemorrhagic gastritis 03/15/10   per  capsule endoscopy report  . Hiatal hernia   . Hyperlipidemia   . Hyperplastic colon polyp   . Hypertension   . Neuropathy    bil big toes  . Osteoporosis   . Presbyesophagus   . Unspecified deficiency anemia 08/24/2013   Iron infusion     Patient Active Problem List   Diagnosis Date Noted  . Orthostatic hypotension 01/12/2016  . Iron deficiency anemia 10/31/2015  . Cataract cortical, senile 07/22/2014  . Absolute anemia   . Leukopenia 09/07/2013  . Deficiency anemia 08/24/2013  . GAVE (gastric antral vascular ectasia) 04/28/2012  . Chronic gastritis 01/09/2011  . Aortic stenosis 01/09/2011  . Epigastric pain 07/29/2010  . HYPERLIPIDEMIA 08/05/2006  . Iron deficiency anemia due to chronic blood loss 08/05/2006  . ANXIETY DEPRESSION 08/05/2006  . NEUROPATHY, IDIOPATHIC PERIPHERAL NEC 08/05/2006  . Essential hypertension 08/05/2006  . GASTROESOPHAGEAL REFLUX DISEASE 08/05/2006  . OSTEOARTHRITIS 08/05/2006  . [place] DISEASE 08/05/2006  . OSTEOPOROSIS 08/05/2006  . SYMPTOM, INCONTINENCE, URINARY NOS 08/05/2006    Past Surgical History:  Procedure Laterality Date  . CATARACT EXTRACTION, BILATERAL    . CHOLECYSTECTOMY    . ENTEROSCOPY N/A 07/08/2014   Procedure: ENTEROSCOPY;  Surgeon: Lafayette Dragon, MD;  Location: [facility] ENDOSCOPY;  Service: Endoscopy;  Laterality: N/A;  . ESOPHAGOGASTRODUODENOSCOPY N/A 04/28/2012   Procedure: ESOPHAGOGASTRODUODENOSCOPY (EGD);  Surgeon: Lafayette Dragon, MD;  Location: [facility];  Service: Endoscopy;  Laterality: N/A;  .  ESOPHAGOGASTRODUODENOSCOPY N/A 04/12/2014   Procedure: ESOPHAGOGASTRODUODENOSCOPY (EGD);  Surgeon: Lafayette Dragon, MD;  Location: [facility];  Service: Endoscopy;  Laterality: N/A;  . FOOT SURGERY  1998  . fracture left knee  1993   x 2   . HOT HEMOSTASIS N/A 04/28/2012   Procedure: HOT HEMOSTASIS (ARGON PLASMA COAGULATION/BICAP);  Surgeon: Lafayette Dragon, MD;  Location: [facility];  Service: Endoscopy;  Laterality: N/A;   . HOT HEMOSTASIS N/A 04/12/2014   Procedure: HOT HEMOSTASIS (ARGON PLASMA COAGULATION/BICAP);  Surgeon: Lafayette Dragon, MD;  Location: [facility];  Service: Endoscopy;  Laterality: N/A;  . HOT HEMOSTASIS N/A 07/08/2014   Procedure: HOT HEMOSTASIS (ARGON PLASMA COAGULATION/BICAP);  Surgeon: Lafayette Dragon, MD;  Location: [facility];  Service: Endoscopy;  Laterality: N/A;  . left eye cornea  surgery  yrs ago  . ORIF WRIST FRACTURE Right   . plantar fasciitis repair     left  . TUBAL LIGATION      OB History    No data available       Home Medications    Prior to Admission medications   Medication Sig Start Date End Date Taking? Authorizing Provider  acetaminophen (TYLENOL) 500 MG tablet Take 500 mg by mouth 2 (two) times daily as needed for mild pain or fever.   Yes [provider]  ALPRAZolam (XANAX) 0.25 MG tablet Take 0.25 mg by mouth 2 (two) times daily as needed for anxiety.   Yes [provider]  Calcium Carb-Cholecalciferol (CALCIUM-VITAMIN D) 500-200 MG-UNIT tablet Take by mouth.   Yes [provider]  cholecalciferol (VITAMIN D) 1000 units tablet Take 2,000 Units by mouth daily.   Yes [provider]  denosumab (PROLIA) 60 MG/ML SOLN Inject 60 mg into the skin every 6 (six) months.    Yes [provider]  DEXILANT 60 MG capsule TAKE 1 TABLET BY MOUTH DAILY EVERY MORNING. 10/19/16  Yes Nandigam, Venia Minks, MD  dexlansoprazole (DEXILANT) 60 MG capsule Take 60 mg by mouth daily.   Yes [provider]  EPINEPHrine (EPIPEN 2-PAK) 0.3 mg/0.3 mL IJ SOAJ injection Inject 0.3 mg into the muscle once as needed (for severe allergic reaction).   Yes [provider]  ferumoxytol Shirlean Kelly) 510 MG/17ML SOLN injection Inject 510 mg into the vein as needed (for low iron).    Yes [provider]  fluorometholone (FML) 0.1 % ophthalmic suspension Place 1 drop into the left eye daily.    Yes [provider]  folic acid  (FOLVITE) 027 MCG tablet Take 800 mcg by mouth daily.   Yes [provider]  gabapentin (NEURONTIN) 400 MG capsule Take 400 mg by mouth 2 (two) times daily.    Yes [provider]  hydroxypropyl methylcellulose / hypromellose (ISOPTO TEARS / GONIOVISC) 2.5 % ophthalmic solution Place 1-2 drops into the right eye as needed for dry eyes.    Yes [provider]  sertraline (ZOLOFT) 100 MG tablet  02/15/16  Yes [provider]  verapamil (CALAN-SR) 240 MG CR tablet Take 240 mg by mouth daily.   Yes [provider]  albuterol (PROVENTIL HFA;VENTOLIN HFA) 108 (90 Base) MCG/ACT inhaler Inhale 1-2 puffs into the lungs every 6 (six) hours as needed for wheezing or shortness of breath. 01/16/17   Jayvier Burgher A, PA-C  benzonatate (TESSALON) 100 MG capsule Take 1 capsule (100 mg total) by mouth every 8 (eight) hours. 01/16/17   Renita Papa, PA-C    Family History  Family History  Problem Relation Age of Onset  . Prostate cancer Father   . Uterine cancer Sister   . Kidney cancer Brother   . Kidney cancer Son   . Colon cancer Sister        mets from uterine cancer    Social History Social History   Tobacco Use  . Smoking status: Former Smoker    Packs/day: 0.50    Years: 20.00    Pack years: 10.00    Types: Cigarettes    Last attempt to quit: 01/08/1989    Years since quitting: 28.0  . Smokeless tobacco: Never Used  Substance Use Topics  . Alcohol use: No    Alcohol/week: 0.0 oz  . Drug use: No     Allergies   Nutritional supplements; [name]; Sertraline; [name] [alendronate sodium]; Lipitor [atorvastatin]; Pregabalin; [name]; and Wasp venom   Review of Systems Review of Systems  Constitutional: Positive for fatigue. Negative for chills and fever.  HENT: Positive for congestion. Negative for ear pain, sinus pressure, sinus pain, sneezing and sore throat.   Respiratory: Positive for cough and chest tightness.  Negative for shortness of breath.   Cardiovascular: Positive for chest pain and leg swelling (chronic, unchanged).  Gastrointestinal: Negative for abdominal pain, blood in stool, constipation, diarrhea, nausea and vomiting.  Genitourinary: Negative for dysuria, hematuria and urgency.  Musculoskeletal: Negative for back pain.  Neurological: Negative for headaches.  All other systems reviewed and are negative.    Physical Exam Updated Vital Signs BP (!) 139/58   Pulse 87   Temp 97.8 F (36.6 C) (Oral)   Resp 17   Ht 5\' 2"  (1.575 m)   Wt 94.8 kg (209 lb)   SpO2 96%   BMI 38.23 kg/m   Physical Exam  Constitutional: She appears well-developed and well-nourished. No distress.  HENT:  Head: Normocephalic and atraumatic.  Eyes: Conjunctivae and EOM are normal. Pupils are equal, round, and reactive to light. Right eye exhibits no discharge. Left eye exhibits no discharge.  Neck: Normal range of motion. Neck supple. No JVD present. No tracheal deviation present.  Cardiovascular: Normal rate.  Murmur heard. High-pitched systolic murmur noted, 2+ radial and DP/PT pulses bl, negative Homan's bl, 2+ BLE edema. Patient states this is chronic and unchanged.   Pulmonary/Chest: Effort normal. Tachypnea noted. She has wheezes. She exhibits tenderness. She exhibits no laceration, no deformity and no retraction.  Equal rise and fall of chest, tachypneic, but speaking in full sentences without difficulty. Scattered diffuse wheezing and ronchi in the anterior and posterior lung fields.   Abdominal: Soft. Bowel sounds are normal. She exhibits no distension. There is no tenderness.  Genitourinary:  Genitourinary Comments: Examination performed in the presence of a chaperone. No frank rectal bleeding. No fissures, tears, tenderness, or fluctuance.   Musculoskeletal:       Right lower leg: She exhibits edema. She exhibits no tenderness.       Left lower leg: She exhibits edema. She exhibits no  tenderness.  Right lateral chest wall tender to palpation with no deformity, crepitus, or paradoxical wall motion. No midline spine TTP, no paraspinal muscle tenderness, no deformity, crepitus, or step-off noted. Moves extremities spontaneously with good strength.   Neurological: She is alert.  Skin: Skin is warm and dry. No erythema.  Psychiatric: She has a normal mood and affect. Her behavior is normal.  Nursing note and vitals reviewed.    ED Treatments / Results  Labs (all labs ordered are  listed, but only abnormal results are displayed) Labs Reviewed  BASIC METABOLIC PANEL - Abnormal; Notable for the following components:      Result Value   Glucose, Bld 102 (*)    BUN 24 (*)    Creatinine, Ser 1.20 (*)    Calcium 8.2 (*)    GFR calc non Af Amer 40 (*)    GFR calc Af Amer 46 (*)    All other components within normal limits  BRAIN NATRIURETIC PEPTIDE - Abnormal; Notable for the following components:   B Natriuretic Peptide 320.0 (*)    All other components within normal limits  CBC WITH DIFFERENTIAL/PLATELET - Abnormal; Notable for the following components:   RBC 3.00 (*)    Hemoglobin 8.8 (*)    HCT 28.2 (*)    RDW 16.6 (*)    All other components within normal limits  POC OCCULT BLOOD, ED - Abnormal; Notable for the following components:   Fecal Occult Bld POSITIVE (*)    All other components within normal limits    EKG  EKG Interpretation  Date/Time:  Wednesday January 16 2017 12:05:37 EST Ventricular Rate:  89 PR Interval:    QRS Duration: 109 QT Interval:  358 QTC Calculation: 436 R Axis:   7 Text Interpretation:  Sinus rhythm Inferior infarct, old Anterior infarct, old Lateral leads are also involved Since last EKG, there are now Q waves in inferior and anterior leads with delayed R wave progressions Lateral TWI are new Confirmed by Duffy Bruce [phone]) on 01/17/2017 3:15:28 PM       Radiology No results found.  Procedures Procedures (including  critical care time)  Medications Ordered in ED Medications  ipratropium-albuterol (DUONEB) 0.5-2.5 (3) MG/3ML nebulizer solution 3 mL (3 mLs Nebulization Given 01/16/17 1431)  predniSONE (DELTASONE) tablet 60 mg (60 mg Oral Given 01/16/17 1641)  albuterol (PROVENTIL HFA;VENTOLIN HFA) 108 (90 Base) MCG/ACT inhaler 1 puff (1 puff Inhalation Given 01/16/17 1715)     Initial Impression / Assessment and Plan / ED Course  I have reviewed the triage vital signs and the nursing notes.  Pertinent labs & imaging results that were available during my care of the patient were reviewed by me and considered in my medical decision making (see chart for details).     Patient presents with cough productive of clear sputum, nasal congestion, and right-sided chest wall pain which is reproducible on palpation.  Afebrile, initially tachypneic with resolution while in the ED after administration of DuoNeb.  She is oxygenating well and is nontoxic in appearance.  H&H is stable and improved from last week prior to undergoing a blood transfusion.  She has lower extremity edema and a slightly elevated BNP which appears to be chronic.  Her wheezing improved after administration of a DuoNeb.  I doubt ACS, MI, or PE given her symptoms and physical examination are more consistent with a viral URI.  Chest x-ray shows chronic bronchitic changes and mild cardiomegaly without evidence of pulmonary edema but no acute cardiopulmonary abnormalities.  She does have new compression fractures of T7 and T11 compared to x-ray in 2012, However patient is nontender to palpation of the spine and has no history of recent trauma or falls.  She is Hemoccult positive, however examination of the abdomen is unremarkable and she has no history of frank melena or hematochezia recently.  She is hemodynamically stable and has close follow-up with her PCP scheduled.  Stable for discharge home with symptomatic treatment of her cough and  nasal  congestion.  Discussed indications for return to the ED.  She will follow-up with her primary care physician in the next 24-48 hours.  Patient and patient's son verbalized understanding of and agreement with plan and patient is stable for discharge home at this time.  Final Clinical Impressions(s) / ED Diagnoses   Final diagnoses:  Viral URI with cough  Chest wall pain    ED Discharge Orders        Ordered    benzonatate (TESSALON) 100 MG capsule  Every 8 hours     01/16/17 1646    albuterol (PROVENTIL HFA;VENTOLIN HFA) 108 (90 Base) MCG/ACT inhaler  Every 6 hours PRN     01/16/17 1646       Renita Papa, PA-C 01/18/17 1807    Macarthur Critchley, MD 01/22/17 [phone]

## 2017-01-16 NOTE — Discharge Instructions (Signed)
Drink plenty of fluids and get plenty of rest.  Continue to use over-the-counter medications for your symptoms.  Use albuterol as needed every 6 hours for shortness of breath.  Use Tessalon for cough.  Follow-up with primary care physician for reevaluation of your symptoms in the next 3-4 days.  Return to the ED if any concerning signs or symptoms develop such as fevers, persisting shortness of breath or chest pain.

## 2017-01-16 NOTE — ED Notes (Signed)
ED Provider at bedside. 

## 2017-01-24 ENCOUNTER — Ambulatory Visit: Payer: Medicare Other | Admitting: Physician Assistant

## 2017-02-02 ENCOUNTER — Emergency Department (HOSPITAL_COMMUNITY): Payer: Medicare Other

## 2017-02-02 ENCOUNTER — Encounter (HOSPITAL_COMMUNITY): Payer: Self-pay

## 2017-02-02 ENCOUNTER — Inpatient Hospital Stay (HOSPITAL_COMMUNITY)
Admission: EM | Admit: 2017-02-02 | Discharge: 2017-02-08 | DRG: 378 | Disposition: A | Discharge status: Skilled Nursing Facility | Payer: Medicare Other | Attending: Internal Medicine | Admitting: Internal Medicine

## 2017-02-02 ENCOUNTER — Other Ambulatory Visit: Payer: Self-pay

## 2017-02-02 DIAGNOSIS — D649 Anemia, unspecified: Secondary | ICD-10-CM | POA: Diagnosis not present

## 2017-02-02 DIAGNOSIS — I130 Hypertensive heart and chronic kidney disease with heart failure and stage 1 through stage 4 chronic kidney disease, or unspecified chronic kidney disease: Secondary | ICD-10-CM | POA: Diagnosis not present

## 2017-02-02 DIAGNOSIS — F418 Other specified anxiety disorders: Secondary | ICD-10-CM | POA: Diagnosis present

## 2017-02-02 DIAGNOSIS — Z91048 Other nonmedicinal substance allergy status: Secondary | ICD-10-CM

## 2017-02-02 DIAGNOSIS — I5042 Chronic combined systolic (congestive) and diastolic (congestive) heart failure: Secondary | ICD-10-CM | POA: Diagnosis present

## 2017-02-02 DIAGNOSIS — Z87891 Personal history of nicotine dependence: Secondary | ICD-10-CM | POA: Diagnosis not present

## 2017-02-02 DIAGNOSIS — K922 Gastrointestinal hemorrhage, unspecified: Secondary | ICD-10-CM | POA: Diagnosis present

## 2017-02-02 DIAGNOSIS — N183 Chronic kidney disease, stage 3 unspecified: Secondary | ICD-10-CM | POA: Diagnosis present

## 2017-02-02 DIAGNOSIS — K219 Gastro-esophageal reflux disease without esophagitis: Secondary | ICD-10-CM | POA: Diagnosis not present

## 2017-02-02 DIAGNOSIS — K31811 Angiodysplasia of stomach and duodenum with bleeding: Secondary | ICD-10-CM | POA: Diagnosis not present

## 2017-02-02 DIAGNOSIS — D6489 Other specified anemias: Secondary | ICD-10-CM | POA: Diagnosis not present

## 2017-02-02 DIAGNOSIS — D539 Nutritional anemia, unspecified: Secondary | ICD-10-CM

## 2017-02-02 DIAGNOSIS — R0902 Hypoxemia: Secondary | ICD-10-CM

## 2017-02-02 DIAGNOSIS — K5790 Diverticulosis of intestine, part unspecified, without perforation or abscess without bleeding: Secondary | ICD-10-CM | POA: Diagnosis present

## 2017-02-02 DIAGNOSIS — Z6838 Body mass index (BMI) 38.0-38.9, adult: Secondary | ICD-10-CM

## 2017-02-02 DIAGNOSIS — I10 Essential (primary) hypertension: Secondary | ICD-10-CM | POA: Diagnosis present

## 2017-02-02 DIAGNOSIS — F341 Dysthymic disorder: Secondary | ICD-10-CM | POA: Diagnosis present

## 2017-02-02 DIAGNOSIS — J208 Acute bronchitis due to other specified organisms: Secondary | ICD-10-CM | POA: Diagnosis not present

## 2017-02-02 DIAGNOSIS — Z800 Family history of malignant neoplasm of digestive organs: Secondary | ICD-10-CM

## 2017-02-02 DIAGNOSIS — G629 Polyneuropathy, unspecified: Secondary | ICD-10-CM | POA: Diagnosis present

## 2017-02-02 DIAGNOSIS — J42 Unspecified chronic bronchitis: Secondary | ICD-10-CM | POA: Diagnosis not present

## 2017-02-02 DIAGNOSIS — Z8719 Personal history of other diseases of the digestive system: Secondary | ICD-10-CM

## 2017-02-02 DIAGNOSIS — D500 Iron deficiency anemia secondary to blood loss (chronic): Secondary | ICD-10-CM | POA: Diagnosis present

## 2017-02-02 DIAGNOSIS — R531 Weakness: Secondary | ICD-10-CM | POA: Diagnosis not present

## 2017-02-02 DIAGNOSIS — Z91030 Bee allergy status: Secondary | ICD-10-CM

## 2017-02-02 DIAGNOSIS — J40 Bronchitis, not specified as acute or chronic: Secondary | ICD-10-CM

## 2017-02-02 DIAGNOSIS — K228 Other specified diseases of esophagus: Secondary | ICD-10-CM | POA: Diagnosis present

## 2017-02-02 DIAGNOSIS — E785 Hyperlipidemia, unspecified: Secondary | ICD-10-CM | POA: Diagnosis present

## 2017-02-02 DIAGNOSIS — E669 Obesity, unspecified: Secondary | ICD-10-CM | POA: Diagnosis present

## 2017-02-02 DIAGNOSIS — R05 Cough: Secondary | ICD-10-CM | POA: Diagnosis not present

## 2017-02-02 DIAGNOSIS — D62 Acute posthemorrhagic anemia: Secondary | ICD-10-CM | POA: Diagnosis not present

## 2017-02-02 DIAGNOSIS — Z888 Allergy status to other drugs, medicaments and biological substances status: Secondary | ICD-10-CM

## 2017-02-02 DIAGNOSIS — K449 Diaphragmatic hernia without obstruction or gangrene: Secondary | ICD-10-CM | POA: Diagnosis present

## 2017-02-02 DIAGNOSIS — M810 Age-related osteoporosis without current pathological fracture: Secondary | ICD-10-CM | POA: Diagnosis present

## 2017-02-02 DIAGNOSIS — I350 Nonrheumatic aortic (valve) stenosis: Secondary | ICD-10-CM

## 2017-02-02 LAB — CBG MONITORING, ED: Glucose-Capillary: 103 mg/dL — ABNORMAL HIGH (ref 65–99)

## 2017-02-02 LAB — POC OCCULT BLOOD, ED: Fecal Occult Bld: POSITIVE — AB

## 2017-02-02 LAB — I-STAT TROPONIN, ED: TROPONIN I, POC: 0.02 ng/mL (ref 0.00–0.08)

## 2017-02-02 MED ORDER — ALBUTEROL SULFATE (2.5 MG/3ML) 0.083% IN NEBU
5.0000 mg | INHALATION_SOLUTION | Freq: Once | RESPIRATORY_TRACT | Status: AC
  Administered 2017-02-03: 5 mg via RESPIRATORY_TRACT
  Filled 2017-02-02: qty 6

## 2017-02-02 NOTE — ED Notes (Signed)
Patient transported to X-ray 

## 2017-02-02 NOTE — ED Notes (Signed)
RN drew blood phle@ [phone]

## 2017-02-02 NOTE — ED Notes (Signed)
Pt desat to 87% on room air, 2L [place] applied, now at 100%

## 2017-02-02 NOTE — ED Provider Notes (Signed)
TIME SEEN: 11:38 PM  CHIEF COMPLAINT: Generalized weakness, shortness of breath, cough  HPI: Patient is an 83 year old female with history of aortic stenosis, CHF, hypertension, hyperlipidemia, iron deficiency anemia who presents to the emergency department with generalized weakness, shortness of breath and cough.  Her son provides most of the history.  He reports that over the past 3 weeks patient has had a nonproductive cough and nasal congestion.  No fevers or chills.  She has had intermittent wheezing.  She has never been a smoker.  Not on oxygen at home.  Denies chest pain or chest discomfort.  Has also felt very weak.  States that she has had to have iron and blood transfusions in the past for iron deficiency anemia.  Last ferritin infusion was 01/11/17 and last blood transfusion was 1 unit of packed red blood cells on 01/12/17.  Son reports that they think that she has a chronic GI bleed from gastritis.  She was seen at [facility] emergency department on 01/15/17 for similar symptoms but her hemoglobin had improved to 8.9 and her breathing improved after breathing treatments.  Denies history of asthma or COPD.  States that she was feeling better but then suddenly got weaker tonight.  She denies any abdominal pain.  No nausea, vomiting or diarrhea.  ROS: See HPI Constitutional: no fever  Eyes: no drainage  ENT: no runny nose   Cardiovascular:  no chest pain  Resp:  SOB  GI: no vomiting GU: no dysuria Integumentary: no rash  Allergy: no hives  Musculoskeletal: no leg swelling  Neurological: no slurred speech ROS otherwise negative  PAST MEDICAL HISTORY/PAST SURGICAL HISTORY:  Past Medical History:  Diagnosis Date  . Anemia   . Anxiety   . Aortic stenosis   . Arthritis   . Blood transfusion   . Cataracts, bilateral    removed  . Congestive heart failure ([place])   . Depression   . Diverticulosis 03/14/10  . Duodenitis 03/15/10   per capsule endoscopy report  . DVT (deep venous  thrombosis) ([place])    pt denies  . Dyslipidemia   . GAVE (gastric antral vascular ectasia)   . GERD (gastroesophageal reflux disease)   . Heart murmur   . Hemorrhagic gastritis 03/15/10   per capsule endoscopy report  . Hiatal hernia   . Hyperlipidemia   . Hyperplastic colon polyp   . Hypertension   . Neuropathy    bil big toes  . Osteoporosis   . Presbyesophagus   . Unspecified deficiency anemia 08/24/2013   Iron infusion     MEDICATIONS:  Prior to Admission medications   Medication Sig Start Date End Date Taking? Authorizing Provider  acetaminophen (TYLENOL) 500 MG tablet Take 500 mg by mouth 2 (two) times daily as needed for mild pain or fever.    [provider]  albuterol (PROVENTIL HFA;VENTOLIN HFA) 108 (90 Base) MCG/ACT inhaler Inhale 1-2 puffs into the lungs every 6 (six) hours as needed for wheezing or shortness of breath. 01/16/17   Nils Flack, Mina A, PA-C  ALPRAZolam (XANAX) 0.25 MG tablet Take 0.25 mg by mouth 2 (two) times daily as needed for anxiety.    [provider]  benzonatate (TESSALON) 100 MG capsule Take 1 capsule (100 mg total) by mouth every 8 (eight) hours. 01/16/17   Fawze, Mina A, PA-C  Calcium Carb-Cholecalciferol (CALCIUM-VITAMIN D) 500-200 MG-UNIT tablet Take by mouth.    [provider]  cholecalciferol (VITAMIN D) 1000 units tablet Take 2,000 Units by  mouth daily.    [provider]  denosumab (PROLIA) 60 MG/ML SOLN Inject 60 mg into the skin every 6 (six) months.     [provider]  DEXILANT 60 MG capsule TAKE 1 TABLET BY MOUTH DAILY EVERY MORNING. 10/19/16   Mauri Pole, MD  dexlansoprazole (DEXILANT) 60 MG capsule Take 60 mg by mouth daily.    [provider]  EPINEPHrine (EPIPEN 2-PAK) 0.3 mg/0.3 mL IJ SOAJ injection Inject 0.3 mg into the muscle once as needed (for severe allergic reaction).    [provider]  ferumoxytol Shirlean Kelly) 510 MG/17ML SOLN injection Inject 510 mg into the  vein as needed (for low iron).     [provider]  fluorometholone (FML) 0.1 % ophthalmic suspension Place 1 drop into the left eye daily.     [provider]  folic acid (FOLVITE) 045 MCG tablet Take 800 mcg by mouth daily.    [provider]  gabapentin (NEURONTIN) 400 MG capsule Take 400 mg by mouth 2 (two) times daily.     [provider]  hydroxypropyl methylcellulose / hypromellose (ISOPTO TEARS / GONIOVISC) 2.5 % ophthalmic solution Place 1-2 drops into the right eye as needed for dry eyes.     [provider]  sertraline (ZOLOFT) 100 MG tablet  02/15/16   [provider]  verapamil (CALAN-SR) 240 MG CR tablet Take 240 mg by mouth daily.    [provider]    ALLERGIES:  Allergies  Allergen Reactions  . Nutritional Supplements Anaphylaxis  . [name] Shortness Of Breath  . Sertraline Shortness Of Breath  . [name] [Alendronate Sodium] Other (See Comments)    Reaction:  GI upset   . Lipitor [Atorvastatin] Other (See Comments)    Reaction:  Muscle aches   . Pregabalin Nausea And Vomiting  . [name] Nausea And Vomiting  . Wasp Venom Hives    SOCIAL HISTORY:  Social History   Tobacco Use  . Smoking status: Former Smoker    Packs/day: 0.50    Years: 20.00    Pack years: 10.00    Types: Cigarettes    Last attempt to quit: 01/08/1989    Years since quitting: 28.0  . Smokeless tobacco: Never Used  Substance Use Topics  . Alcohol use: No    Alcohol/week: 0.0 oz    FAMILY HISTORY: Family History  Problem Relation Age of Onset  . Prostate cancer Father   . Uterine cancer Sister   . Kidney cancer Brother   . Kidney cancer Son   . Colon cancer Sister        mets from uterine cancer    EXAM: BP (!) 165/73 (BP Location: Left Arm)   Pulse 88   Temp 97.9 F (36.6 C) (Oral)   Resp 16   Ht 5\' 2"  (1.575 m)   Wt 94.8 kg (209 lb)   SpO2 93%   BMI 38.23 kg/m  CONSTITUTIONAL: Alert and  oriented and responds appropriately to questions.  Elderly, obese, chronically ill-appearing HEAD: Normocephalic EYES: Conjunctivae clear, pale conjunctiva, pupils appear equal, EOMI ENT: normal nose; moist mucous membranes NECK: Supple, no meningismus, no nuchal rigidity, no LAD  CARD: RRR; S1 and S2 appreciated; no murmurs, no clicks, no rubs, no gallops RESP: Normal chest excursion without splinting or tachypnea; patient does have diffuse expiratory wheezes, no rhonchi or rales, is hypoxic to 87% on room air at rest, appears short of breath with just minimal movement in the bed  but no respiratory distress ABD/GI: Normal bowel sounds; non-distended; soft, non-tender, no rebound, no guarding, no peritoneal signs, no hepatosplenomegaly RECTAL:  Normal rectal tone, no gross blood or melena, guaiac positive, no hemorrhoids appreciated, nontender rectal exam, no fecal impaction BACK:  The back appears normal and is non-tender to palpation, there is no CVA tenderness EXT: Normal ROM in all joints; non-tender to palpation; no edema; normal capillary refill; no cyanosis, no calf tenderness or swelling    SKIN: Normal color for age and race; warm; no rash NEURO: Moves all extremities equally PSYCH: The patient's mood and manner are appropriate. Grooming and personal hygiene are appropriate.  MEDICAL DECISION MAKING: Patient here with shortness of breath, wheezing, cough.  Differential includes pneumonia, viral URI, CHF.  Less likely ACS.  EKG shows no new ischemic abnormality.  Also concerned that her weakness could be from anemia.  We will check coags, type and screen and hemoglobin today.  She is mildly guaiac positive here but no gross blood or melena.  Abdominal exam benign.  Also obtain urinalysis to evaluate for any infection.  Patient will likely need admission given she has a new oxygen requirement.  ED PROGRESS: Patient's chest x-ray shows bronchitic changes versus edema.  She does not appear  volume overloaded and BNP is only mildly elevated at 364.  Wheezing has improved with albuterol treatment.  I suspect viral illness.  I do not feel she needs antibiotics.  No sign of pneumonia.   Patient has a hemoglobin of 6.3.  Will transfuse 2 units of packed red blood cells.  She is minimally guaiac positive here.  Suspect from chronic GI bleed.  Hemodynamically stable.  Will discuss with medicine for admission.  PCP is Dr. [name].   1:58 AM Discussed patient's case with hospitalist, Dr. Blaine Hamper.  I have recommended admission and patient (and family if present) agree with this plan. Admitting physician will place admission orders.   I reviewed all nursing notes, vitals, pertinent previous records, EKGs, lab and urine results, imaging (as available).       EKG Interpretation  Date/Time:  Saturday February 02 2017 23:21:13 EST Ventricular Rate:  83 PR Interval:    QRS Duration: 96 QT Interval:  365 QTC Calculation: 429 R Axis:   25 Text Interpretation:  Sinus rhythm Anterior infarct, old No significant change since last tracing Confirmed by Kristin Barcus, Cyril Mourning [phone]) on 02/02/2017 11:39:10 PM         CRITICAL CARE Performed by: Cyril Mourning Dorma Altman   Total critical care time: 35 minutes  Critical care time was exclusive of separately billable procedures and treating other patients.  Critical care was necessary to treat or prevent imminent or life-threatening deterioration.  Critical care was time spent personally by me on the following activities: development of treatment plan with patient and/or surrogate as well as nursing, discussions with consultants, evaluation of patient's response to treatment, examination of patient, obtaining history from patient or surrogate, ordering and performing treatments and interventions, ordering and review of laboratory studies, ordering and review of radiographic studies, pulse oximetry and re-evaluation of patient's condition.     Lafayette Dunlevy, Delice Bison, DO 02/03/17 [phone]

## 2017-02-02 NOTE — ED Notes (Signed)
  CBG 103  

## 2017-02-02 NOTE — ED Triage Notes (Addendum)
Pt from home with ems c.o generalized weakness for the past few days but stated it has been worse today. Pt has  HRhx of anemia with iron and blood transfusions. On 01/15/17. Pt pale, and sob on exertion. Pt also c.o productive cough BP 128/70 Hr98, 93% on room air. nad Pt alert and oriented

## 2017-02-03 ENCOUNTER — Other Ambulatory Visit: Payer: Self-pay

## 2017-02-03 ENCOUNTER — Encounter (HOSPITAL_COMMUNITY): Payer: Self-pay | Admitting: *Deleted

## 2017-02-03 DIAGNOSIS — K2961 Other gastritis with bleeding: Secondary | ICD-10-CM | POA: Diagnosis not present

## 2017-02-03 DIAGNOSIS — K219 Gastro-esophageal reflux disease without esophagitis: Secondary | ICD-10-CM | POA: Diagnosis not present

## 2017-02-03 DIAGNOSIS — D62 Acute posthemorrhagic anemia: Secondary | ICD-10-CM | POA: Diagnosis not present

## 2017-02-03 DIAGNOSIS — I5042 Chronic combined systolic (congestive) and diastolic (congestive) heart failure: Secondary | ICD-10-CM | POA: Diagnosis present

## 2017-02-03 DIAGNOSIS — J42 Unspecified chronic bronchitis: Secondary | ICD-10-CM | POA: Diagnosis present

## 2017-02-03 DIAGNOSIS — N183 Chronic kidney disease, stage 3 unspecified: Secondary | ICD-10-CM | POA: Diagnosis present

## 2017-02-03 DIAGNOSIS — Z800 Family history of malignant neoplasm of digestive organs: Secondary | ICD-10-CM | POA: Diagnosis not present

## 2017-02-03 DIAGNOSIS — F418 Other specified anxiety disorders: Secondary | ICD-10-CM | POA: Diagnosis present

## 2017-02-03 DIAGNOSIS — M6281 Muscle weakness (generalized): Secondary | ICD-10-CM | POA: Diagnosis not present

## 2017-02-03 DIAGNOSIS — R2681 Unsteadiness on feet: Secondary | ICD-10-CM | POA: Diagnosis not present

## 2017-02-03 DIAGNOSIS — Z888 Allergy status to other drugs, medicaments and biological substances status: Secondary | ICD-10-CM | POA: Diagnosis not present

## 2017-02-03 DIAGNOSIS — Z5189 Encounter for other specified aftercare: Secondary | ICD-10-CM | POA: Diagnosis not present

## 2017-02-03 DIAGNOSIS — D500 Iron deficiency anemia secondary to blood loss (chronic): Secondary | ICD-10-CM | POA: Diagnosis not present

## 2017-02-03 DIAGNOSIS — K922 Gastrointestinal hemorrhage, unspecified: Secondary | ICD-10-CM

## 2017-02-03 DIAGNOSIS — R531 Weakness: Secondary | ICD-10-CM | POA: Diagnosis not present

## 2017-02-03 DIAGNOSIS — I10 Essential (primary) hypertension: Secondary | ICD-10-CM | POA: Diagnosis not present

## 2017-02-03 DIAGNOSIS — K449 Diaphragmatic hernia without obstruction or gangrene: Secondary | ICD-10-CM | POA: Diagnosis not present

## 2017-02-03 DIAGNOSIS — D600 Chronic acquired pure red cell aplasia: Secondary | ICD-10-CM | POA: Diagnosis not present

## 2017-02-03 DIAGNOSIS — I350 Nonrheumatic aortic (valve) stenosis: Secondary | ICD-10-CM

## 2017-02-03 DIAGNOSIS — K31819 Angiodysplasia of stomach and duodenum without bleeding: Secondary | ICD-10-CM | POA: Diagnosis not present

## 2017-02-03 DIAGNOSIS — D649 Anemia, unspecified: Secondary | ICD-10-CM

## 2017-02-03 DIAGNOSIS — Q399 Congenital malformation of esophagus, unspecified: Secondary | ICD-10-CM | POA: Diagnosis not present

## 2017-02-03 DIAGNOSIS — K5790 Diverticulosis of intestine, part unspecified, without perforation or abscess without bleeding: Secondary | ICD-10-CM | POA: Diagnosis present

## 2017-02-03 DIAGNOSIS — Z6838 Body mass index (BMI) 38.0-38.9, adult: Secondary | ICD-10-CM | POA: Diagnosis not present

## 2017-02-03 DIAGNOSIS — Z8719 Personal history of other diseases of the digestive system: Secondary | ICD-10-CM | POA: Diagnosis not present

## 2017-02-03 DIAGNOSIS — G629 Polyneuropathy, unspecified: Secondary | ICD-10-CM | POA: Diagnosis present

## 2017-02-03 DIAGNOSIS — K228 Other specified diseases of esophagus: Secondary | ICD-10-CM | POA: Diagnosis present

## 2017-02-03 DIAGNOSIS — K31811 Angiodysplasia of stomach and duodenum with bleeding: Secondary | ICD-10-CM | POA: Diagnosis present

## 2017-02-03 DIAGNOSIS — R278 Other lack of coordination: Secondary | ICD-10-CM | POA: Diagnosis not present

## 2017-02-03 DIAGNOSIS — E785 Hyperlipidemia, unspecified: Secondary | ICD-10-CM | POA: Diagnosis present

## 2017-02-03 DIAGNOSIS — Z87891 Personal history of nicotine dependence: Secondary | ICD-10-CM | POA: Diagnosis not present

## 2017-02-03 DIAGNOSIS — M810 Age-related osteoporosis without current pathological fracture: Secondary | ICD-10-CM | POA: Diagnosis present

## 2017-02-03 DIAGNOSIS — I130 Hypertensive heart and chronic kidney disease with heart failure and stage 1 through stage 4 chronic kidney disease, or unspecified chronic kidney disease: Secondary | ICD-10-CM | POA: Diagnosis present

## 2017-02-03 DIAGNOSIS — R100 Acute abdomen: Secondary | ICD-10-CM | POA: Diagnosis not present

## 2017-02-03 DIAGNOSIS — R195 Other fecal abnormalities: Secondary | ICD-10-CM | POA: Diagnosis not present

## 2017-02-03 DIAGNOSIS — Z91048 Other nonmedicinal substance allergy status: Secondary | ICD-10-CM | POA: Diagnosis not present

## 2017-02-03 DIAGNOSIS — E669 Obesity, unspecified: Secondary | ICD-10-CM | POA: Diagnosis present

## 2017-02-03 DIAGNOSIS — R488 Other symbolic dysfunctions: Secondary | ICD-10-CM | POA: Diagnosis not present

## 2017-02-03 DIAGNOSIS — Z91030 Bee allergy status: Secondary | ICD-10-CM | POA: Diagnosis not present

## 2017-02-03 LAB — URINALYSIS, ROUTINE W REFLEX MICROSCOPIC
Bilirubin Urine: NEGATIVE
GLUCOSE, UA: NEGATIVE mg/dL
HGB URINE DIPSTICK: NEGATIVE
Ketones, ur: NEGATIVE mg/dL
LEUKOCYTES UA: NEGATIVE
Nitrite: NEGATIVE
Protein, ur: NEGATIVE mg/dL
SPECIFIC GRAVITY, URINE: 1.012 (ref 1.005–1.030)
pH: 6 (ref 5.0–8.0)

## 2017-02-03 LAB — HEPATIC FUNCTION PANEL
ALK PHOS: 86 U/L (ref 38–126)
ALT: 9 U/L — AB (ref 14–54)
AST: 15 U/L (ref 15–41)
Albumin: 3.3 g/dL — ABNORMAL LOW (ref 3.5–5.0)
BILIRUBIN TOTAL: 0.3 mg/dL (ref 0.3–1.2)
Total Protein: 6.3 g/dL — ABNORMAL LOW (ref 6.5–8.1)

## 2017-02-03 LAB — CBC
HCT: 22.1 % — ABNORMAL LOW (ref 36.0–46.0)
HCT: 25.8 % — ABNORMAL LOW (ref 36.0–46.0)
HEMATOCRIT: 28.7 % — AB (ref 36.0–46.0)
HEMOGLOBIN: 6.3 g/dL — AB (ref 12.0–15.0)
HEMOGLOBIN: 8.7 g/dL — AB (ref 12.0–15.0)
Hemoglobin: 7.9 g/dL — ABNORMAL LOW (ref 12.0–15.0)
MCH: 27.8 pg (ref 26.0–34.0)
MCH: 28 pg (ref 26.0–34.0)
MCH: 28 pg (ref 26.0–34.0)
MCHC: 28.5 g/dL — ABNORMAL LOW (ref 30.0–36.0)
MCHC: 30.6 g/dL (ref 30.0–36.0)
MCHC: 30.3 g/dL (ref 30.0–36.0)
MCV: 97.4 fL (ref 78.0–100.0)
MCV: 91.5 fL (ref 78.0–100.0)
MCV: 92.3 fL (ref 78.0–100.0)
PLATELETS: 247 10*3/uL (ref 150–400)
Platelets: 315 10*3/uL (ref 150–400)
Platelets: 259 10*3/uL (ref 150–400)
RBC: 2.27 MIL/uL — AB (ref 3.87–5.11)
RBC: 2.82 MIL/uL — AB (ref 3.87–5.11)
RBC: 3.11 MIL/uL — AB (ref 3.87–5.11)
RDW: 18.6 % — ABNORMAL HIGH (ref 11.5–15.5)
RDW: 19.9 % — AB (ref 11.5–15.5)
RDW: 19.9 % — ABNORMAL HIGH (ref 11.5–15.5)
WBC: 6.7 10*3/uL (ref 4.0–10.5)
WBC: 4.7 10*3/uL (ref 4.0–10.5)
WBC: 4.5 10*3/uL (ref 4.0–10.5)

## 2017-02-03 LAB — INFLUENZA PANEL BY PCR (TYPE A & B)
INFLBPCR: NEGATIVE
Influenza A By PCR: NEGATIVE

## 2017-02-03 LAB — BASIC METABOLIC PANEL
ANION GAP: 6 (ref 5–15)
Anion gap: 4 — ABNORMAL LOW (ref 5–15)
BUN: 26 mg/dL — ABNORMAL HIGH (ref 6–20)
BUN: 16 mg/dL (ref 6–20)
CHLORIDE: 110 mmol/L (ref 101–111)
CHLORIDE: 112 mmol/L — AB (ref 101–111)
CO2: 24 mmol/L (ref 22–32)
CO2: 24 mmol/L (ref 22–32)
CREATININE: 1.05 mg/dL — AB (ref 0.44–1.00)
Calcium: 8.3 mg/dL — ABNORMAL LOW (ref 8.9–10.3)
Calcium: 7.8 mg/dL — ABNORMAL LOW (ref 8.9–10.3)
Creatinine, Ser: 1.2 mg/dL — ABNORMAL HIGH (ref 0.44–1.00)
GFR calc Af Amer: 54 mL/min — ABNORMAL LOW (ref >60)
GFR calc non Af Amer: 40 mL/min — ABNORMAL LOW (ref >60)
GFR calc non Af Amer: 47 mL/min — ABNORMAL LOW (ref >60)
GFR, EST AFRICAN AMERICAN: 46 mL/min — AB (ref >60)
GLUCOSE: 102 mg/dL — AB (ref 65–99)
Glucose, Bld: 107 mg/dL — ABNORMAL HIGH (ref 65–99)
POTASSIUM: 5.1 mmol/L (ref 3.5–5.1)
Potassium: 4.6 mmol/L (ref 3.5–5.1)
SODIUM: 140 mmol/L (ref 135–145)
Sodium: 140 mmol/L (ref 135–145)

## 2017-02-03 LAB — BRAIN NATRIURETIC PEPTIDE: B NATRIURETIC PEPTIDE 5: 364.5 pg/mL — AB (ref 0.0–100.0)

## 2017-02-03 LAB — CBG MONITORING, ED: GLUCOSE-CAPILLARY: 96 mg/dL (ref 65–99)

## 2017-02-03 LAB — PROTIME-INR
INR: 0.96
PROTHROMBIN TIME: 12.7 s (ref 11.4–15.2)

## 2017-02-03 LAB — LIPASE, BLOOD: Lipase: 46 U/L (ref 11–51)

## 2017-02-03 LAB — APTT: APTT: 29 s (ref 24–36)

## 2017-02-03 LAB — PREPARE RBC (CROSSMATCH)

## 2017-02-03 MED ORDER — ACETAMINOPHEN 500 MG PO TABS
500.0000 mg | ORAL_TABLET | Freq: Four times a day (QID) | ORAL | Status: DC | PRN
  Administered 2017-02-04 – 2017-02-08 (×6): 500 mg via ORAL
  Filled 2017-02-03 (×6): qty 1

## 2017-02-03 MED ORDER — IPRATROPIUM-ALBUTEROL 0.5-2.5 (3) MG/3ML IN SOLN
3.0000 mL | Freq: Three times a day (TID) | RESPIRATORY_TRACT | Status: DC
  Administered 2017-02-03 – 2017-02-07 (×11): 3 mL via RESPIRATORY_TRACT
  Filled 2017-02-03 (×14): qty 3

## 2017-02-03 MED ORDER — SODIUM CHLORIDE 0.9 % IV SOLN
510.0000 mg | Freq: Once | INTRAVENOUS | Status: AC
  Administered 2017-02-03: 510 mg via INTRAVENOUS
  Filled 2017-02-03: qty 17

## 2017-02-03 MED ORDER — ONDANSETRON HCL 4 MG PO TABS: 4.0000 mg | ORAL_TABLET | Freq: Four times a day (QID) | ORAL | Status: DC | PRN

## 2017-02-03 MED ORDER — SODIUM CHLORIDE 0.9 % IV BOLUS (SEPSIS)
500.0000 mL | Freq: Once | INTRAVENOUS | Status: AC
  Administered 2017-02-03: 500 mL via INTRAVENOUS

## 2017-02-03 MED ORDER — ONDANSETRON HCL 4 MG/2ML IJ SOLN: 4.0000 mg | Freq: Four times a day (QID) | INTRAMUSCULAR | Status: DC | PRN

## 2017-02-03 MED ORDER — VITAMIN D 1000 UNITS PO TABS
2000.0000 [IU] | ORAL_TABLET | Freq: Every day | ORAL | Status: DC
  Administered 2017-02-03 – 2017-02-08 (×6): 2000 [IU] via ORAL
  Filled 2017-02-03 (×6): qty 2

## 2017-02-03 MED ORDER — HEPARIN SOD (PORK) LOCK FLUSH 100 UNIT/ML IV SOLN
250.0000 [IU] | INTRAVENOUS | Status: DC | PRN
  Filled 2017-02-03: qty 3

## 2017-02-03 MED ORDER — HYDRALAZINE HCL 20 MG/ML IJ SOLN: 5.0000 mg | INTRAMUSCULAR | Status: DC | PRN

## 2017-02-03 MED ORDER — FOLIC ACID 1 MG PO TABS
1000.0000 ug | ORAL_TABLET | Freq: Every day | ORAL | Status: DC
  Administered 2017-02-03 – 2017-02-08 (×6): 1 mg via ORAL
  Filled 2017-02-03 (×6): qty 1

## 2017-02-03 MED ORDER — SODIUM CHLORIDE 0.9% FLUSH: 3.0000 mL | INTRAVENOUS | Status: DC | PRN

## 2017-02-03 MED ORDER — PANTOPRAZOLE SODIUM 40 MG IV SOLR: 40.0000 mg | Freq: Two times a day (BID) | INTRAVENOUS | Status: DC

## 2017-02-03 MED ORDER — ZOLPIDEM TARTRATE 5 MG PO TABS
5.0000 mg | ORAL_TABLET | Freq: Every evening | ORAL | Status: DC | PRN
  Administered 2017-02-04 – 2017-02-05 (×2): 5 mg via ORAL
  Filled 2017-02-03 (×3): qty 1

## 2017-02-03 MED ORDER — SODIUM CHLORIDE 0.9 % IV SOLN
8.0000 mg/h | INTRAVENOUS | Status: DC
  Administered 2017-02-03 – 2017-02-04 (×3): 8 mg/h via INTRAVENOUS
  Filled 2017-02-03 (×7): qty 80

## 2017-02-03 MED ORDER — SERTRALINE HCL 100 MG PO TABS
100.0000 mg | ORAL_TABLET | Freq: Every day | ORAL | Status: DC
Start: 2017-02-03 — End: 2017-02-08
  Administered 2017-02-04: 100 mg via ORAL
  Filled 2017-02-03 (×5): qty 1

## 2017-02-03 MED ORDER — SODIUM CHLORIDE 0.9% FLUSH
10.0000 mL | INTRAVENOUS | Status: AC | PRN
  Administered 2017-02-04: 10 mL

## 2017-02-03 MED ORDER — SODIUM CHLORIDE 0.9 % IV SOLN
10.0000 mL/h | Freq: Once | INTRAVENOUS | Status: AC
  Administered 2017-02-03: 10 mL/h via INTRAVENOUS

## 2017-02-03 MED ORDER — POLYVINYL ALCOHOL 1.4 % OP SOLN: 1.0000 [drp] | OPHTHALMIC | Status: DC | PRN

## 2017-02-03 MED ORDER — ALBUTEROL SULFATE (2.5 MG/3ML) 0.083% IN NEBU: 5.0000 mg | INHALATION_SOLUTION | RESPIRATORY_TRACT | Status: DC | PRN

## 2017-02-03 MED ORDER — FERUMOXYTOL INJECTION 510 MG/17 ML: 510.0000 mg | Freq: Once | INTRAVENOUS | Status: DC

## 2017-02-03 MED ORDER — GABAPENTIN 400 MG PO CAPS
800.0000 mg | ORAL_CAPSULE | Freq: Two times a day (BID) | ORAL | Status: DC
  Administered 2017-02-03 – 2017-02-08 (×11): 800 mg via ORAL
  Filled 2017-02-03 (×11): qty 2

## 2017-02-03 MED ORDER — SODIUM CHLORIDE 0.9 % IV SOLN
INTRAVENOUS | Status: DC
Start: 2017-02-03 — End: 2017-02-03
  Administered 2017-02-03: 04:00:00 via INTRAVENOUS

## 2017-02-03 MED ORDER — FLUOROMETHOLONE 0.1 % OP SUSP
1.0000 [drp] | Freq: Every day | OPHTHALMIC | Status: DC
  Administered 2017-02-03 – 2017-02-08 (×6): 1 [drp] via OPHTHALMIC
  Filled 2017-02-03: qty 5

## 2017-02-03 MED ORDER — ALPRAZOLAM 0.25 MG PO TABS
0.2500 mg | ORAL_TABLET | Freq: Two times a day (BID) | ORAL | Status: DC | PRN
  Administered 2017-02-03 – 2017-02-07 (×8): 0.25 mg via ORAL
  Filled 2017-02-03 (×8): qty 1

## 2017-02-03 MED ORDER — IPRATROPIUM BROMIDE 0.02 % IN SOLN: 0.5000 mg | RESPIRATORY_TRACT | Status: DC

## 2017-02-03 MED ORDER — EPINEPHRINE 0.3 MG/0.3ML IJ SOAJ
0.3000 mg | Freq: Once | INTRAMUSCULAR | Status: DC | PRN
  Filled 2017-02-03: qty 0.3

## 2017-02-03 MED ORDER — CALCIUM CARBONATE-VITAMIN D 500-200 MG-UNIT PO TABS
1.0000 | ORAL_TABLET | Freq: Every day | ORAL | Status: DC
  Administered 2017-02-04 – 2017-02-08 (×5): 1 via ORAL
  Filled 2017-02-03 (×7): qty 1

## 2017-02-03 MED ORDER — HEPARIN SOD (PORK) LOCK FLUSH 100 UNIT/ML IV SOLN
500.0000 [IU] | Freq: Every day | INTRAVENOUS | Status: DC | PRN
  Filled 2017-02-03: qty 5

## 2017-02-03 MED ORDER — SODIUM CHLORIDE 0.9 % IV SOLN
80.0000 mg | Freq: Once | INTRAVENOUS | Status: AC
  Administered 2017-02-03: 80 mg via INTRAVENOUS
  Filled 2017-02-03: qty 80

## 2017-02-03 MED ORDER — VERAPAMIL HCL ER 240 MG PO TBCR
240.0000 mg | EXTENDED_RELEASE_TABLET | Freq: Every day | ORAL | Status: DC
  Administered 2017-02-03 – 2017-02-08 (×6): 240 mg via ORAL
  Filled 2017-02-03 (×6): qty 1

## 2017-02-03 MED ORDER — DM-GUAIFENESIN ER 30-600 MG PO TB12: 1.0000 | ORAL_TABLET | Freq: Two times a day (BID) | ORAL | Status: DC | PRN

## 2017-02-03 NOTE — ED Notes (Signed)
Pt NPO.

## 2017-02-03 NOTE — Plan of Care (Signed)
Progressing

## 2017-02-03 NOTE — ED Notes (Signed)
Pt is resting and appears comfortable.  Family at bedside.  She's made aware that she will be moved to a different room to wait on a hospital room.  Pt denies any complaints.

## 2017-02-03 NOTE — ED Notes (Signed)
Pt transferred to hospital bed for comfort.

## 2017-02-03 NOTE — ED Notes (Signed)
No blood draw at this time,  Pt receving blood transfusion.

## 2017-02-03 NOTE — Progress Notes (Signed)
  PROGRESS NOTE  Sophia [name] IRJ:[id number] DOB: [birth year] DOA: 02/02/2017 PCP: [name], MD  Brief Narrative: 19yow PMH chronic GIB, chronic blood loss anemia secondary to GAVE presented with weakness and mild SOB. Admitted for PRBC transfusion.  Assessment/Plan Acute on chronic blood loss anemia, symptomatic, secondary to chronic GIB secondary to GAVE, followed by GI Dr and heme Dr. [name] her note 12/27 for detailed history. Anemia has been investigated with nurmerous endoscopies, bone marrow biopsy and has had recurrent iron tx and PRBC transfusion. S/p Feraheme - finish 2 units PRBC in process - serial CBC - consult GI 12/31 if Hgb fails to improve or has bleeding here, otherwise has f/u already 1/7 with [facility].  Essential HTN - stable  CKD stage III - at baseline   Chronic combined systolic/diastolic CHF. Not on diuretics at home. - appears compensated; follow I/O  Aortic stenosis  - asymptomatic  DVT prophylaxis: SCDs Code Status: full Family Communication: son at bedside Disposition Plan: home    [name], MD  [facility] Direct contact: [phone] --[address] [place]  --[email or web address]; password [email or web address]  7PM-7AM contact night coverage as above 02/03/2017, 10:34 AM  LOS: 0 days   Consultants:    Procedures:  2 units PRBC 12/30  Antimicrobials:    Interval history/Subjective: Feels better; currently getting PRBC. No pain. Breathing fine.  Objective: Vitals:  Vitals:   02/03/17 0930 02/03/17 1000  BP: (!) 112/44 (!) 104/91  Pulse: 86 87  Resp: 15 (!) 26  Temp:    SpO2: 95% 95%    Exam:  Constitutional:  . Appears calm and comfortable ENMT:  . grossly normal hearing  . Lips appear normal Respiratory:  . CTA bilaterally, no w/r/r.  . Respiratory effort normal. Cardiovascular:  . RRR, no m/r/g . No LE extremity edema   Abdomen:  . soft Psychiatric:  . Mental status o Mood, affect appropriate  I have  personally reviewed the following:  Filed Weights   02/02/17 2316  Weight: 94.8 kg (209 lb)   Weight change:   Labs:  Hgb 6.3 >>  Fecal occult positive  Imaging studies:  CXR independent review: NAD  Medical tests:  EKG SR no acute changes   Scheduled Meds: . calcium-vitamin D  1 tablet Oral Daily  . cholecalciferol  2,000 Units Oral Daily  . fluorometholone  1 drop Left Eye Daily  . folic acid  3,557 mcg Oral Daily  . gabapentin  800 mg Oral BID  . ipratropium-albuterol  3 mL Nebulization TID  . [START ON 02/06/2017] pantoprazole  40 mg Intravenous Q12H  . sertraline  100 mg Oral Daily  . verapamil  240 mg Oral Daily   Continuous Infusions: . pantoprozole (PROTONIX) infusion 8 mg/hr (02/03/17 0421)    Principal Problem:   GIB (gastrointestinal bleeding) Active Problems:   Iron deficiency anemia due to chronic blood loss   ANXIETY DEPRESSION   Essential hypertension   GASTROESOPHAGEAL REFLUX DISEASE   Aortic stenosis   CKD (chronic kidney disease), stage III ([facility])   Chronic combined systolic (congestive) and diastolic (congestive) heart failure ([place])   LOS: 0 days    Prolonged services: 8:50-9:40. Direct contact. Discussion of plan of care

## 2017-02-03 NOTE — H&P (Signed)
History and Physical    Sophia [name] IPJ:[id number] DOB: [birth year] DOA: 02/02/2017  Referring MD/NP/PA:   PCP: [name], MD   Patient coming from:  The patient is coming from home.  At baseline, pt is independent for most of ADL.   Chief Complaint: Rectal bleeding  HPI: Sophia [name] is a 83 y.o. female with medical history significant of hypertension, hyperlipidemia, GERD, depression, anxiety, anemia, diverticulosis, GI bleeding due to gastric antral vascular ectasia, CHF with EF of 40%, CKD-3, who presents with rectal bleeding.  Per her son, patient has chronic blood loss anemia due to chronic slow GI bleeding. Patient was intermittently transfused with blood and given iron infusion when her hemoglobin is low by Dr. Addison Bailey. In the past few days, patient developed generalized weakness, and mild exertional shortness of breath. She did not notice any blood in the stool. No chest pain, nausea, vomiting or diarrhea. She had mild lower abdominal discomfort earlier, which has resolved.  Recently patient has been having cough and mild SOB. She has little white mucus production. No runny nose or sore throat. Patient was seen by PCP and was treated with prednisone with some improvement.  # EGD by Dr. Olevia Perches on 07/08/14 showed GAVE syndrome, APC ablation of the gastric antral vascular lesions and jejunal diverticuli.  # Colonoscope by Dr. Iona Beard on 08/31/1997 showed internal hemorrhoid and occasional diverticulum  ED Course: pt was found to have positive FOBT, hemoglobin dropped from 8.8 on 01/16/17-->6.3, BNP 364.5, stable renal function, temperature normal, slightly tachycardia, oxygen saturation 88% on room air, chest chest showed chronic bronchitis change without infiltration. Patient is admitted to telemetry bed as inpatient.  Review of Systems:   General: no fevers, chills, no body weight gain, has fatigue HEENT: no blurry vision, hearing changes or sore throat Respiratory: has  dyspnea, coughing, wheezing CV: no chest pain, no palpitations GI: no nausea, vomiting, abdominal pain, diarrhea, constipation.  GU: no dysuria, burning on urination, increased urinary frequency, hematuria  Ext: no leg edema Neuro: no unilateral weakness, numbness, or tingling, no vision change or hearing loss Skin: no rash, no skin tear. MSK: No muscle spasm, no deformity, no limitation of range of movement in spin Heme: No easy bruising.  Travel history: No recent long distant travel.  Allergy:  Allergies  Allergen Reactions  . Nutritional Supplements Anaphylaxis  . [name] Shortness Of Breath  . [name] [Alendronate Sodium] Other (See Comments)    Reaction:  GI upset   . Lipitor [Atorvastatin] Other (See Comments)    Reaction:  Muscle aches   . Pregabalin Nausea And Vomiting  . [name] Nausea And Vomiting  . Wasp Venom Hives    Past Medical History:  Diagnosis Date  . Anemia   . Anxiety   . Aortic stenosis   . Arthritis   . Blood transfusion   . Cataracts, bilateral    removed  . Congestive heart failure ([place])   . Depression   . Diverticulosis 03/14/10  . Duodenitis 03/15/10   per capsule endoscopy report  . DVT (deep venous thrombosis) ([place])    pt denies  . Dyslipidemia   . GAVE (gastric antral vascular ectasia)   . GERD (gastroesophageal reflux disease)   . Heart murmur   . Hemorrhagic gastritis 03/15/10   per capsule endoscopy report  . Hiatal hernia   . Hyperlipidemia   . Hyperplastic colon polyp   . Hypertension   . Neuropathy    bil big toes  .  Osteoporosis   . Presbyesophagus   . Unspecified deficiency anemia 08/24/2013   Iron infusion     Past Surgical History:  Procedure Laterality Date  . CATARACT EXTRACTION, BILATERAL    . CHOLECYSTECTOMY    . ENTEROSCOPY N/A 07/08/2014   Procedure: ENTEROSCOPY;  Surgeon: Lafayette Dragon, MD;  Location: [facility];  Service: Endoscopy;  Laterality: N/A;  . ESOPHAGOGASTRODUODENOSCOPY N/A  04/28/2012   Procedure: ESOPHAGOGASTRODUODENOSCOPY (EGD);  Surgeon: Lafayette Dragon, MD;  Location: [facility];  Service: Endoscopy;  Laterality: N/A;  . ESOPHAGOGASTRODUODENOSCOPY N/A 04/12/2014   Procedure: ESOPHAGOGASTRODUODENOSCOPY (EGD);  Surgeon: Lafayette Dragon, MD;  Location: [facility];  Service: Endoscopy;  Laterality: N/A;  . FOOT SURGERY  1998  . fracture left knee  1993   x 2   . HOT HEMOSTASIS N/A 04/28/2012   Procedure: HOT HEMOSTASIS (ARGON PLASMA COAGULATION/BICAP);  Surgeon: Lafayette Dragon, MD;  Location: [facility];  Service: Endoscopy;  Laterality: N/A;  . HOT HEMOSTASIS N/A 04/12/2014   Procedure: HOT HEMOSTASIS (ARGON PLASMA COAGULATION/BICAP);  Surgeon: Lafayette Dragon, MD;  Location: [facility];  Service: Endoscopy;  Laterality: N/A;  . HOT HEMOSTASIS N/A 07/08/2014   Procedure: HOT HEMOSTASIS (ARGON PLASMA COAGULATION/BICAP);  Surgeon: Lafayette Dragon, MD;  Location: [facility];  Service: Endoscopy;  Laterality: N/A;  . left eye cornea  surgery  yrs ago  . ORIF WRIST FRACTURE Right   . plantar fasciitis repair     left  . TUBAL LIGATION      Social History:  reports that she quit smoking about 28 years ago. Her smoking use included cigarettes. She has a 10.00 pack-year smoking history. she has never used smokeless tobacco. She reports that she does not drink alcohol or use drugs.  Family History:  Family History  Problem Relation Age of Onset  . Prostate cancer Father   . Uterine cancer Sister   . Kidney cancer Brother   . Kidney cancer Son   . Colon cancer Sister        mets from uterine cancer     Prior to Admission medications   Medication Sig Start Date End Date Taking? Authorizing Provider  acetaminophen (TYLENOL) 500 MG tablet Take 500 mg by mouth 2 (two) times daily as needed for mild pain or fever.   Yes [provider]  albuterol (PROVENTIL HFA;VENTOLIN HFA) 108 (90 Base) MCG/ACT inhaler Inhale 1-2 puffs into the lungs every 6 (six) hours as  needed for wheezing or shortness of breath. 01/16/17  Yes Fawze, Mina A, PA-C  ALPRAZolam (XANAX) 0.25 MG tablet Take 0.25 mg by mouth 2 (two) times daily as needed for anxiety.   Yes [provider]  Calcium Carb-Cholecalciferol (CALCIUM-VITAMIN D) 500-200 MG-UNIT tablet Take by mouth.   Yes [provider]  cholecalciferol (VITAMIN D) 1000 units tablet Take 2,000 Units by mouth daily.   Yes [provider]  denosumab (PROLIA) 60 MG/ML SOLN Inject 60 mg into the skin every 6 (six) months.    Yes [provider]  DEXILANT 60 MG capsule TAKE 1 TABLET BY MOUTH DAILY EVERY MORNING. 10/19/16  Yes Nandigam, Venia Minks, MD  EPINEPHrine (EPIPEN 2-PAK) 0.3 mg/0.3 mL IJ SOAJ injection Inject 0.3 mg into the muscle once as needed (for severe allergic reaction).   Yes [provider]  ferumoxytol Shirlean Kelly) 510 MG/17ML SOLN injection Inject 510 mg into the vein as needed (for low iron).    Yes [provider]  fluorometholone (FML) 0.1 %  ophthalmic suspension Place 1 drop into the left eye daily.    Yes [provider]  folic acid (FOLVITE) 413 MCG tablet Take 800 mcg by mouth daily.   Yes [provider]  gabapentin (NEURONTIN) 400 MG capsule Take 800 mg by mouth 2 (two) times daily.    Yes [provider]  hydroxypropyl methylcellulose / hypromellose (ISOPTO TEARS / GONIOVISC) 2.5 % ophthalmic solution Place 1-2 drops into the right eye as needed for dry eyes.    Yes [provider]  sertraline (ZOLOFT) 100 MG tablet Take 100 mg by mouth daily.  02/15/16  Yes [provider]  verapamil (CALAN-SR) 240 MG CR tablet Take 240 mg by mouth daily.   Yes [provider]  benzonatate (TESSALON) 100 MG capsule Take 1 capsule (100 mg total) by mouth every 8 (eight) hours. Patient not taking: Reported on 02/03/2017 01/16/17   Debroah Baller    Physical Exam: Vitals:   02/03/17 0547 02/03/17 0600 02/03/17 0610  02/03/17 0630  BP: (!) 125/52 113/61 108/65 (!) 120/56  Pulse: 85 89 80 81  Resp: (!) 22 (!) 22 20 20   Temp: 98.1 F (36.7 C)  98.6 F (37 C)   TempSrc: Oral  Oral   SpO2: 97% 97% 96% 94%  Weight:      Height:       General: Not in acute distress HEENT:       Eyes: PERRL, EOMI, no scleral icterus.       ENT: No discharge from the ears and nose, no pharynx injection, no tonsillar enlargement.        Neck: No JVD, no bruit, no mass felt. Heme: No neck lymph node enlargement. Cardiac: K4/M0, RRR, 3/6 systolic murmurs, No gallops or rubs. Respiratory: Has mild wheezing bilaterally. GI: Soft, nondistended, nontender, no rebound pain, no organomegaly, BS present. GU: No hematuria Ext: No pitting leg edema bilaterally. 2+DP/PT pulse bilaterally. Musculoskeletal: No joint deformities, No joint redness or warmth, no limitation of ROM in spin. Skin: No rashes.  Neuro: Alert, oriented X3, cranial nerves II-XII grossly intact, moves all extremities normally.  Psych: Patient is not psychotic, no suicidal or hemocidal ideation.  Labs on Admission: I have personally reviewed following labs and imaging studies  CBC: Recent Labs  Lab 02/02/17 2329  WBC 6.7  HGB 6.3*  HCT 22.1*  MCV 97.4  PLT 102   Basic Metabolic Panel: Recent Labs  Lab 02/02/17 2329  NA 140  K 5.1  CL 110  CO2 24  GLUCOSE 107*  BUN 26*  CREATININE 1.20*  CALCIUM 8.3*   GFR: Estimated Creatinine Clearance: 36.1 mL/min (A) (by C-G formula based on SCr of 1.2 mg/dL (H)). Liver Function Tests: Recent Labs  Lab 02/02/17 2330  AST 15  ALT 9*  ALKPHOS 86  BILITOT 0.3  PROT 6.3*  ALBUMIN 3.3*   Recent Labs  Lab 02/02/17 2330  LIPASE 46   No results for input(s): AMMONIA in the last 168 hours. Coagulation Profile: Recent Labs  Lab 02/02/17 2330  INR 0.96   Cardiac Enzymes: No results for input(s): CKTOTAL, CKMB, CKMBINDEX, TROPONINI in the last 168 hours. BNP (last 3 results) No results for  input(s): PROBNP in the last 8760 hours. HbA1C: No results for input(s): HGBA1C in the last 72 hours. CBG: Recent Labs  Lab 02/02/17 2333  GLUCAP 103*   Lipid Profile: No results for input(s): CHOL, HDL, LDLCALC, TRIG, CHOLHDL, LDLDIRECT in the last 72 hours. Thyroid  Function Tests: No results for input(s): TSH, T4TOTAL, FREET4, T3FREE, THYROIDAB in the last 72 hours. Anemia Panel: No results for input(s): VITAMINB12, FOLATE, FERRITIN, TIBC, IRON, RETICCTPCT in the last 72 hours. Urine analysis:    Component Value Date/Time   COLORURINE STRAW (A) 02/02/2017 2329   APPEARANCEUR CLEAR 02/02/2017 2329   LABSPEC 1.012 02/02/2017 2329   PHURINE 6.0 02/02/2017 2329   GLUCOSEU NEGATIVE 02/02/2017 2329   HGBUR NEGATIVE 02/02/2017 2329   BILIRUBINUR NEGATIVE 02/02/2017 2329   KETONESUR NEGATIVE 02/02/2017 2329   PROTEINUR NEGATIVE 02/02/2017 2329   UROBILINOGEN 0.2 08/27/2014 1925   NITRITE NEGATIVE 02/02/2017 2329   LEUKOCYTESUR NEGATIVE 02/02/2017 2329   Sepsis Labs: @LABRCNTIP (procalcitonin:4,lacticidven:4) )No results found for this or any previous visit (from the past 240 hour(s)).   Radiological Exams on Admission: Dg Chest 2 View  Result Date: 02/02/2017 CLINICAL DATA:  83 year old female with cough. EXAM: CHEST  2 VIEW COMPARISON:  Chest radiograph dated 01/16/2017 FINDINGS: Diffuse interstitial and peribronchial prominence, likely chronic. Mild edema is not excluded. Clinical correlation is recommended. No focal consolidation, pleural effusion, or pneumothorax. There is a small hiatal hernia. No acute osseous pathology. IMPRESSION: Probable chronic bronchitic changes. Mild interstitial edema is not entirely excluded. Clinical correlation is recommended. Electronically Signed   By: Anner Crete M.D.   On: 02/02/2017 23:56     EKG: Independently reviewed.  Not done in ED, will get one.   Assessment/Plan Principal Problem:   GIB (gastrointestinal bleeding) Active  Problems:   Iron deficiency anemia due to chronic blood loss   ANXIETY DEPRESSION   Essential hypertension   GASTROESOPHAGEAL REFLUX DISEASE   Aortic stenosis   CKD (chronic kidney disease), stage III (H[facility])   Chronic combined systolic (congestive) and diastolic (congestive) heart failure ([facility])   GIB (gastrointestinal bleeding): this is chronic issue. Patient was intermittently transfused with blood and given iron infusion when her hemoglobin is low by Dr. Addison Bailey. hgb 8.8-->6.3. She is still symptomatically with weakness and SOB.  -will admit to tele bed -give one of Feraheme - transfuse 2 units of blood now - NPO for possible EGD - IVF: 500 mL NS bolus - Start IV pantoprazole gtt - Zofran IV for nausea - Avoid NSAIDs and SQ heparin - Maintain IV access (2 large bore IVs if possible). - Monitor closely and follow q6h cbc, transfuse as necessary, if Hgb<7.0 - La[facility]: INR, PTT and type screen - please call GI in AM  Iron deficiency anemia due to chronic blood loss: --give one of Feraheme  HTN:  -Continue home medications: verapamil -IV hydralazine prn  GERD: -on Protonix IV  CKD (chronic kidney disease), stage III ([place]): Stable. Baseline creatinine 1.2-1.4. His creatinine is 1.20, BUN 26. -Follow-up renal function by BMP  Depression and anxiety: Stable, no suicidal or homicidal ideations. -Continue home medications  Chronic combined systolic (congestive) and diastolic (congestive) heart failure ([place]): 2-D echo 811/3/17 showed EF of 40-50 percent with grade 1 diastolic dysfunction. Patient does not have leg edema. His shortness breath is likely due to anemia. CHF seems to be compensated. -Check BNP   DVT ppx: SCD Code Status: Full code Family Communication:  Yes, patient's son   at bed side Disposition Plan:  Anticipate discharge back to previous home environment Consults called: none  Admission status:  Inpatient/tele      Date of Service 02/03/2017    Ivor Costa [facility] Pager [phone]  If 7PM-7AM, please contact night-coverage [email or web address] Password [email or web address] 02/03/2017, 7:31 AM

## 2017-02-03 NOTE — ED Notes (Signed)
Lunch tray ordered; heart healthy diet 

## 2017-02-03 NOTE — ED Notes (Signed)
[name] left [phone] if pt needs anything

## 2017-02-04 DIAGNOSIS — D600 Chronic acquired pure red cell aplasia: Secondary | ICD-10-CM

## 2017-02-04 DIAGNOSIS — D62 Acute posthemorrhagic anemia: Secondary | ICD-10-CM

## 2017-02-04 LAB — BPAM RBC
BLOOD PRODUCT EXPIRATION DATE: 201901152359
Blood Product Expiration Date: 201901152359
ISSUE DATE / TIME: 201812300251
ISSUE DATE / TIME: 201812300537
UNIT TYPE AND RH: 5100
Unit Type and Rh: 5100

## 2017-02-04 LAB — CBC
HEMATOCRIT: 28.9 % — AB (ref 36.0–46.0)
Hemoglobin: 8.6 g/dL — ABNORMAL LOW (ref 12.0–15.0)
MCH: 27.5 pg (ref 26.0–34.0)
MCHC: 29.8 g/dL — AB (ref 30.0–36.0)
MCV: 92.3 fL (ref 78.0–100.0)
Platelets: 254 10*3/uL (ref 150–400)
RBC: 3.13 MIL/uL — ABNORMAL LOW (ref 3.87–5.11)
RDW: 19.8 % — AB (ref 11.5–15.5)
WBC: 5.6 10*3/uL (ref 4.0–10.5)

## 2017-02-04 LAB — TYPE AND SCREEN
ABO/RH(D): O POS
ANTIBODY SCREEN: NEGATIVE
UNIT DIVISION: 0
UNIT DIVISION: 0

## 2017-02-04 LAB — GLUCOSE, CAPILLARY: GLUCOSE-CAPILLARY: 84 mg/dL (ref 65–99)

## 2017-02-04 MED ORDER — PANTOPRAZOLE SODIUM 40 MG PO TBEC
40.0000 mg | DELAYED_RELEASE_TABLET | Freq: Every day | ORAL | Status: DC
  Administered 2017-02-05 – 2017-02-08 (×3): 40 mg via ORAL
  Filled 2017-02-04 (×4): qty 1

## 2017-02-04 NOTE — Progress Notes (Signed)
  PROGRESS NOTE  Sophia [name] BSJ:[id number] DOB: 24-Feb-1928 DOA: 02/02/2017 PCP: [name], MD  Brief Narrative: 89yow PMH chronic GIB, chronic blood loss anemia secondary to GAVE presented with weakness and mild SOB. Admitted for PRBC transfusion.  Assessment/Plan Acute on chronic blood loss anemia, symptomatic, secondary to chronic GIB secondary to GAVE, followed by GI Dr and heme Dr. [name] her note 12/27 for detailed history. Anemia has been investigated with nurmerous endoscopies, bone marrow biopsy and has had recurrent iron tx and PRBC transfusion.  - S/p Feraheme in ED - s/p 2 units PRBC with appropriate response - Hgb stable, no evidence of ongoing bleeding - yesterday discussed with son if Hgb stable would d/c with rec to keep outpt f/u with hematology 1/7 and GI 1/9. Today son uncomfortable with d/c without GI involvement. GI consulted. - if EGD rec, will remain inpt, if GI recs outpt, will d/c home today  Essential HTN - remains stable  CKD stage III - at baseline   Chronic combined systolic/diastolic CHF. Not on diuretics at home. - appears compensated 12/31; I/O +474  Aortic stenosis  - asymptomatic  DVT prophylaxis: SCDs Code Status: full Family Communication: son at bedside 12/31 Disposition Plan: home    Sophia Hodgkins, MD  [facility] Direct contact: [phone] --[place] [place]  --[email or web address]; password [email or web address]  7PM-7AM contact night coverage as above 02/04/2017, 1:46 PM  LOS: 1 day   Consultants:    Procedures:  2 units PRBC 12/30  Antimicrobials:    Interval history/Subjective: Feels ok, has left shoulder pain from coughing. No bleeding noted.  Objective: Vitals:  Vitals:   02/04/17 0530 02/04/17 1315  BP: 134/61 (!) 105/40  Pulse: 88 72  Resp: 18 18  Temp: 98.3 F (36.8 C) 98.2 F (36.8 C)  SpO2: 98% 95%    Exam: Constitutional:   . Appears calm and comfortable Respiratory:  . CTA bilaterally, no  w/r/r.  . Respiratory effort normal.  Cardiovascular:  . RRR, no m/r/g . Telemetry SR Musculoskeletal:  o LUE pain with palpation of scapula, no rash noted Psychiatric:  . Mental status o Mood, affect appropriate  I have personally reviewed the following:  Filed Weights   02/02/17 2316 02/03/17 1550  Weight: 94.8 kg (209 lb) 94.7 kg (208 lb 12.4 oz)   Weight change: -0.102 kg (-3.6 oz)  Labs:  Hgb 6.3 >> 7.9 > 8.7 > 8.6  Scheduled Meds: . calcium-vitamin D  1 tablet Oral Daily  . cholecalciferol  2,000 Units Oral Daily  . fluorometholone  1 drop Left Eye Daily  . folic acid  6,568 mcg Oral Daily  . gabapentin  800 mg Oral BID  . ipratropium-albuterol  3 mL Nebulization TID  . [START ON 02/06/2017] pantoprazole  40 mg Intravenous Q12H  . sertraline  100 mg Oral Daily  . verapamil  240 mg Oral Daily   Continuous Infusions: . pantoprozole (PROTONIX) infusion 8 mg/hr (02/04/17 0158)    Principal Problem:   GIB (gastrointestinal bleeding) Active Problems:   Iron deficiency anemia due to chronic blood loss   ANXIETY DEPRESSION   Essential hypertension   GASTROESOPHAGEAL REFLUX DISEASE   Aortic stenosis   CKD (chronic kidney disease), stage III (H[facility])   Chronic combined systolic (congestive) and diastolic (congestive) heart failure ([place])   LOS: 1 day

## 2017-02-04 NOTE — Consult Note (Signed)
[facility] Consult: 2:56 PM 02/04/2017  LOS: 1 day    Referring Provider: Dr. [name] at prompting of family. Primary Care Physician:  [name], MD Primary Gastroenterologist:  Dr. Silverio Decamp.  Previously a patient of Dr. [name]    Reason for Consultation: Heme positive anemia.   HPI: Sophia [name] is a 83 y.o. female. 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 S/P  APC ablation of the gastric lesions in 2014,  March 2016 and June 2016.  Last colonoscopy 2012 with resection of hyperplastic polyps.  Patient's followed by Dr. Alvy Bimler and has required parenteral iron infusions as well as periodic red blood cell transfusions.  She last had Ferumoxytol on 01/11/17.   Received blood transfusion 6/22 for Hgb 7.  Hgb PRBC x 1 cells on 12/8 for Hgb 7.9.    She never looks at her bowel movements but did see a tarry stool maybe 3 weeks ago.  Her son suspects that her stools are mostly dark.  Just prior to her admission 12/29 with Hgb 6.3, she was becoming progressively weaker.  Received 2 U PRBCs, Hgb this morning is 8.6.  Stool is FOBT positive.  Denies nausea, vomiting.  Saturday night she complained of lower abdominal pain, this is resolved.  Her fatigue, weakness is better following transfusion. No ASA, NSAIDs, plavix, AC etc at home.        Past Medical History:  Diagnosis Date  . Anemia   . Anxiety   . Aortic stenosis   . Arthritis   . Blood transfusion   . Cataracts, bilateral    removed  . Congestive heart failure ([place])   . Depression   . Diverticulosis 03/14/10  . Duodenitis 03/15/10   per capsule endoscopy report  . DVT (deep venous thrombosis) ([place])    pt denies  . Dyslipidemia   . GAVE (gastric antral vascular ectasia)   . GERD (gastroesophageal reflux  disease)   . Heart murmur   . Hemorrhagic gastritis 03/15/10   per capsule endoscopy report  . Hiatal hernia   . Hyperlipidemia   . Hyperplastic colon polyp   . Hypertension   . Neuropathy    bil big toes  . Osteoporosis   . Presbyesophagus   . Unspecified deficiency anemia 08/24/2013   Iron infusion     Past Surgical History:  Procedure Laterality Date  . CATARACT EXTRACTION, BILATERAL    . CHOLECYSTECTOMY    . ENTEROSCOPY N/A 07/08/2014   Procedure: ENTEROSCOPY;  Surgeon: Lafayette Dragon, MD;  Location: [facility];  Service: Endoscopy;  Laterality: N/A;  . ESOPHAGOGASTRODUODENOSCOPY N/A 04/28/2012   Procedure: ESOPHAGOGASTRODUODENOSCOPY (EGD);  Surgeon: Lafayette Dragon, MD;  Location: [facility];  Service: Endoscopy;  Laterality: N/A;  . ESOPHAGOGASTRODUODENOSCOPY N/A 04/12/2014   Procedure: ESOPHAGOGASTRODUODENOSCOPY (EGD);  Surgeon: Lafayette Dragon, MD;  Location: [facility];  Service: Endoscopy;  Laterality: N/A;  . FOOT SURGERY  1998  . fracture left knee  1993   x 2   . HOT HEMOSTASIS N/A 04/28/2012   Procedure:  HOT HEMOSTASIS (ARGON PLASMA COAGULATION/BICAP);  Surgeon: Lafayette Dragon, MD;  Location: [facility];  Service: Endoscopy;  Laterality: N/A;  . HOT HEMOSTASIS N/A 04/12/2014   Procedure: HOT HEMOSTASIS (ARGON PLASMA COAGULATION/BICAP);  Surgeon: Lafayette Dragon, MD;  Location: [facility];  Service: Endoscopy;  Laterality: N/A;  . HOT HEMOSTASIS N/A 07/08/2014   Procedure: HOT HEMOSTASIS (ARGON PLASMA COAGULATION/BICAP);  Surgeon: Lafayette Dragon, MD;  Location: [facility];  Service: Endoscopy;  Laterality: N/A;  . left eye cornea  surgery  yrs ago  . ORIF WRIST FRACTURE Right   . plantar fasciitis repair     left  . TUBAL LIGATION      Prior to Admission medications   Medication Sig Start Date End Date Taking? Authorizing Provider  acetaminophen (TYLENOL) 500 MG tablet Take 500 mg by mouth 2 (two) times daily as needed for mild pain or fever.   Yes [provider]  albuterol (PROVENTIL HFA;VENTOLIN HFA) 108 (90 Base) MCG/ACT inhaler Inhale 1-2 puffs into the lungs every 6 (six) hours as needed for wheezing or shortness of breath. 01/16/17  Yes Fawze, Mina A, PA-C  ALPRAZolam (XANAX) 0.25 MG tablet Take 0.25 mg by mouth 2 (two) times daily as needed for anxiety.   Yes [provider]  Calcium Carb-Cholecalciferol (CALCIUM-VITAMIN D) 500-200 MG-UNIT tablet Take by mouth.   Yes [provider]  cholecalciferol (VITAMIN D) 1000 units tablet Take 2,000 Units by mouth daily.   Yes [provider]  denosumab (PROLIA) 60 MG/ML SOLN Inject 60 mg into the skin every 6 (six) months.    Yes [provider]  DEXILANT 60 MG capsule TAKE 1 TABLET BY MOUTH DAILY EVERY MORNING. 10/19/16  Yes Nandigam, Venia Minks, MD  EPINEPHrine (EPIPEN 2-PAK) 0.3 mg/0.3 mL IJ SOAJ injection Inject 0.3 mg into the muscle once as needed (for severe allergic reaction).   Yes [provider]  ferumoxytol Shirlean Kelly) 510 MG/17ML SOLN injection Inject 510 mg into the vein as needed (for low iron).    Yes [provider]  fluorometholone (FML) 0.1 % ophthalmic suspension Place 1 drop into the left eye daily.    Yes [provider]  folic acid (FOLVITE) 976 MCG tablet Take 800 mcg by mouth daily.   Yes [provider]  gabapentin (NEURONTIN) 400 MG capsule Take 800 mg by mouth 2 (two) times daily.    Yes [provider]  hydroxypropyl methylcellulose / hypromellose (ISOPTO TEARS / GONIOVISC) 2.5 % ophthalmic solution Place 1-2 drops into the right eye as needed for dry eyes.    Yes [provider]  sertraline (ZOLOFT) 100 MG tablet Take 100 mg by mouth daily.  02/15/16  Yes [provider]  verapamil (CALAN-SR) 240 MG CR tablet Take 240 mg by mouth daily.   Yes [provider]  benzonatate (TESSALON) 100 MG capsule Take 1 capsule (100 mg total) by mouth every 8 (eight) hours. Patient not taking:  Reported on 02/03/2017 01/16/17   Rodell Perna A, PA-C    Scheduled Meds: . calcium-vitamin D  1 tablet Oral Daily  . cholecalciferol  2,000 Units Oral Daily  . fluorometholone  1 drop Left Eye Daily  . folic acid  7,341 mcg Oral Daily  . gabapentin  800 mg Oral BID  . ipratropium-albuterol  3 mL Nebulization TID  . [START ON 02/06/2017] pantoprazole  40 mg Intravenous Q12H  . sertraline  100 mg Oral Daily  . verapamil  240 mg Oral  Daily   Infusions: . pantoprozole (PROTONIX) infusion 8 mg/hr (02/04/17 0158)   PRN Meds: acetaminophen, albuterol, ALPRAZolam, dextromethorphan-guaiFENesin, EPINEPHrine, heparin lock flush, heparin lock flush, hydrALAZINE, ondansetron **OR** ondansetron (ZOFRAN) IV, polyvinyl alcohol, sodium chloride flush, sodium chloride flush, zolpidem   Allergies as of 02/02/2017 - Review Complete 02/02/2017  Allergen Reaction Noted  . Nutritional supplements Anaphylaxis 01/09/2011  . [name] Shortness Of Breath   . Sertraline Shortness Of Breath 11/07/2011  . [name] [alendronate sodium] Other (See Comments) 08/24/2013  . Lipitor [atorvastatin] Other (See Comments) 11/07/2011  . Pregabalin Nausea And Vomiting 01/09/2011  . [name] Nausea And Vomiting   . Wasp venom Hives 08/25/2013    Family History  Problem Relation Age of Onset  . Prostate cancer Father   . Uterine cancer Sister   . Kidney cancer Brother   . Kidney cancer Son   . Colon cancer Sister        mets from uterine cancer    Social History   Socioeconomic History  . Marital status: Widowed    Spouse name: Not on file  . Number of children: Not on file  . Years of education: Not on file  . Highest education level: Not on file  Social Needs  . Financial resource strain: Not on file  . Food insecurity - worry: Not on file  . Food insecurity - inability: Not on file  . Transportation needs - medical: Not on file  . Transportation needs - non-medical: Not on file    Occupational History  . Occupation: retired  Tobacco Use  . Smoking status: Former Smoker    Packs/day: 0.50    Years: 20.00    Pack years: 10.00    Types: Cigarettes    Last attempt to quit: 01/08/1989    Years since quitting: 28.0  . Smokeless tobacco: Never Used  Substance and Sexual Activity  . Alcohol use: No    Alcohol/week: 0.0 oz  . Drug use: No  . Sexual activity: No    Birth control/protection: Post-menopausal  Other Topics Concern  . Not on file  Social History Narrative  . Not on file    REVIEW OF SYSTEMS: Constitutional:  Per HPI ENT:  No nose bleeds Pulm: Improved shortness of breath.  Cough which is mostly nonproductive. CV:  No palpitations, no LE edema.  Chest pain. GU:  No hematuria, no frequency GI:  Per HPI Heme: Denies excessive bleeding or bruising. Transfusions:  Per HPI MS: Pain at the lower end of her right scapula when she coughs.  This is been present for 2 or 3 weeks. Neuro:  No headaches, no peripheral tingling or numbness Derm:  No itching, no rash or sores.  Endocrine:  No sweats or chills.  No polyuria or dysuria Immunization: Had her flu shot this fall. Travel:  None beyond local counties in last few months.    PHYSICAL EXAM: Vital signs in last 24 hours: Vitals:   02/04/17 0530 02/04/17 1315  BP: 134/61 (!) 105/40  Pulse: 88 72  Resp: 18 18  Temp: 98.3 F (36.8 C) 98.2 F (36.8 C)  SpO2: 98% 95%   Wt Readings from Last 3 Encounters:  02/03/17 94.7 kg (208 lb 12.4 oz)  01/16/17 94.8 kg (209 lb)  01/25/16 95 kg (209 lb 6.4 oz)    General: Obese, pale, frail and chronically ill-appearing WF. Head: No facial asymmetry or swelling.  No signs of head trauma. Eyes: No scleral icterus.  No conjunctival pallor.  Ears: Minor hearing deficit. Nose: No discharge or congestion. Mouth: Moist, pink, clear oral mucosa.  Tongue midline. Neck: No JVD.  No masses, no thyromegaly. Lungs: Clear bilaterally.  No labored breathing.   Occasional cough. Heart: RRR.  S1, S2 present.  Harsh systolic murmur. Abdomen: Soft.  Obese.  No HSM, hernias, masses, bruits.  Active bowel sounds.  Not tender.. Rectal: Deferred rectal exam. Musc/Skeltl: Spinal kyphosis.  No joint erythema or swelling. Extremities: No CCE. Neurologic: Alert.  Oriented x3.  Moves all 4s.   Skin:  Some telangectasia.   Nodes:  No cervical adenopathy.     Psych:  Cooperative, pleasant.    Intake/Output from previous day: 12/30 0701 - 12/31 0700 In: 1224.3 [I.V.:888.3; Blood:336] Out: 1700 [Urine:1700] Intake/Output this shift: No intake/output data recorded.  LAB RESULTS: Recent Labs    02/03/17 1550 02/03/17 2134 02/04/17 0857  WBC 4.7 4.5 5.6  HGB 7.9* 8.7* 8.6*  HCT 25.8* 28.7* 28.9*  PLT 247 259 254   BMET Lab Results  Component Value Date   NA 140 02/03/2017   NA 140 02/02/2017   NA 136 01/16/2017   K 4.6 02/03/2017   K 5.1 02/02/2017   K 4.6 01/16/2017   CL 112 (H) 02/03/2017   CL 110 02/02/2017   CL 105 01/16/2017   CO2 24 02/03/2017   CO2 24 02/02/2017   CO2 23 01/16/2017   GLUCOSE 102 (H) 02/03/2017   GLUCOSE 107 (H) 02/02/2017   GLUCOSE 102 (H) 01/16/2017   BUN 16 02/03/2017   BUN 26 (H) 02/02/2017   BUN 24 (H) 01/16/2017   CREATININE 1.05 (H) 02/03/2017   CREATININE 1.20 (H) 02/02/2017   CREATININE 1.20 (H) 01/16/2017   CALCIUM 7.8 (L) 02/03/2017   CALCIUM 8.3 (L) 02/02/2017   CALCIUM 8.2 (L) 01/16/2017   LFT Recent Labs    02/02/17 2330  PROT 6.3*  ALBUMIN 3.3*  AST 15  ALT 9*  ALKPHOS 86  BILITOT 0.3  BILIDIR <0.1*  IBILI NOT CALCULATED   PT/INR Lab Results  Component Value Date   INR 0.96 02/02/2017   INR 2.3 (H) 02/07/2008   INR 2.2 (H) 02/06/2008   Hepatitis Panel No results for input(s): HEPBSAG, HCVAB, HEPAIGM, HEPBIGM in the last 72 hours. C-Diff No components found for: CDIFF Lipase     Component Value Date/Time   LIPASE 46 02/02/2017 2330    Drugs of Abuse  No results found  for: LABOPIA, COCAINSCRNUR, LABBENZ, AMPHETMU, THCU, LABBARB   RADIOLOGY STUDIES: Dg Chest 2 View  Result Date: 02/02/2017 CLINICAL DATA:  83 year old female with cough. EXAM: CHEST  2 VIEW COMPARISON:  Chest radiograph dated 01/16/2017 FINDINGS: Diffuse interstitial and peribronchial prominence, likely chronic. Mild edema is not excluded. Clinical correlation is recommended. No focal consolidation, pleural effusion, or pneumothorax. There is a small hiatal hernia. No acute osseous pathology. IMPRESSION: Probable chronic bronchitic changes. Mild interstitial edema is not entirely excluded. Clinical correlation is recommended. Electronically Signed   By: Anner Crete M.D.   On: 02/02/2017 23:56     IMPRESSION:   *   Anemia due to GI blood loss in patient with history of GAVE    PLAN:     *  Plan upper endoscopy on 02/07/16.  In the meantime she can eat a heart healthy diet.  We will make her n.p.o. for the study after midnight 02/07/16  *   Does not need IV Protonix drip.  She takes once daily Dexilant at home so  here in the hospital will replace this with once daily oral Protonix.   Azucena Freed  02/04/2017, 2:56 PM Pager: [phone]

## 2017-02-04 NOTE — H&P (View-Only) (Signed)
[facility] Consult: 2:56 PM 02/04/2017  LOS: 1 day    Referring Provider: Dr. [name] at prompting of family. Primary Care Physician:  [name], MD Primary Gastroenterologist:  Dr. Silverio Decamp.  Previously a patient of Dr. [name]    Reason for Consultation: Heme positive anemia.   HPI: Sophia [name] is a 83 y.o. female. 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 S/P  APC ablation of the gastric lesions in 2014,  March 2016 and June 2016.  Last colonoscopy 2012 with resection of hyperplastic polyps.  Patient's followed by Dr. Alvy Bimler and has required parenteral iron infusions as well as periodic red blood cell transfusions.  She last had Ferumoxytol on 01/11/17.   Received blood transfusion 6/22 for Hgb 7.  Hgb PRBC x 1 cells on 12/8 for Hgb 7.9.    She never looks at her bowel movements but did see a tarry stool maybe 3 weeks ago.  Her son suspects that her stools are mostly dark.  Just prior to her admission 12/29 with Hgb 6.3, she was becoming progressively weaker.  Received 2 U PRBCs, Hgb this morning is 8.6.  Stool is FOBT positive.  Denies nausea, vomiting.  Saturday night she complained of lower abdominal pain, this is resolved.  Her fatigue, weakness is better following transfusion. No ASA, NSAIDs, plavix, AC etc at home.        Past Medical History:  Diagnosis Date  . Anemia   . Anxiety   . Aortic stenosis   . Arthritis   . Blood transfusion   . Cataracts, bilateral    removed  . Congestive heart failure ([place])   . Depression   . Diverticulosis 03/14/10  . Duodenitis 03/15/10   per capsule endoscopy report  . DVT (deep venous thrombosis) ([place])    pt denies  . Dyslipidemia   . GAVE (gastric antral vascular ectasia)   . GERD (gastroesophageal reflux  disease)   . Heart murmur   . Hemorrhagic gastritis 03/15/10   per capsule endoscopy report  . Hiatal hernia   . Hyperlipidemia   . Hyperplastic colon polyp   . Hypertension   . Neuropathy    bil big toes  . Osteoporosis   . Presbyesophagus   . Unspecified deficiency anemia 08/24/2013   Iron infusion     Past Surgical History:  Procedure Laterality Date  . CATARACT EXTRACTION, BILATERAL    . CHOLECYSTECTOMY    . ENTEROSCOPY N/A 07/08/2014   Procedure: ENTEROSCOPY;  Surgeon: Lafayette Dragon, MD;  Location: [facility];  Service: Endoscopy;  Laterality: N/A;  . ESOPHAGOGASTRODUODENOSCOPY N/A 04/28/2012   Procedure: ESOPHAGOGASTRODUODENOSCOPY (EGD);  Surgeon: Lafayette Dragon, MD;  Location: [facility];  Service: Endoscopy;  Laterality: N/A;  . ESOPHAGOGASTRODUODENOSCOPY N/A 04/12/2014   Procedure: ESOPHAGOGASTRODUODENOSCOPY (EGD);  Surgeon: Lafayette Dragon, MD;  Location: [facility];  Service: Endoscopy;  Laterality: N/A;  . FOOT SURGERY  1998  . fracture left knee  1993   x 2   . HOT HEMOSTASIS N/A 04/28/2012   Procedure:  HOT HEMOSTASIS (ARGON PLASMA COAGULATION/BICAP);  Surgeon: Lafayette Dragon, MD;  Location: [facility];  Service: Endoscopy;  Laterality: N/A;  . HOT HEMOSTASIS N/A 04/12/2014   Procedure: HOT HEMOSTASIS (ARGON PLASMA COAGULATION/BICAP);  Surgeon: Lafayette Dragon, MD;  Location: [facility];  Service: Endoscopy;  Laterality: N/A;  . HOT HEMOSTASIS N/A 07/08/2014   Procedure: HOT HEMOSTASIS (ARGON PLASMA COAGULATION/BICAP);  Surgeon: Lafayette Dragon, MD;  Location: [facility];  Service: Endoscopy;  Laterality: N/A;  . left eye cornea  surgery  yrs ago  . ORIF WRIST FRACTURE Right   . plantar fasciitis repair     left  . TUBAL LIGATION      Prior to Admission medications   Medication Sig Start Date End Date Taking? Authorizing Provider  acetaminophen (TYLENOL) 500 MG tablet Take 500 mg by mouth 2 (two) times daily as needed for mild pain or fever.   Yes [provider]  albuterol (PROVENTIL HFA;VENTOLIN HFA) 108 (90 Base) MCG/ACT inhaler Inhale 1-2 puffs into the lungs every 6 (six) hours as needed for wheezing or shortness of breath. 01/16/17  Yes Fawze, Mina A, PA-C  ALPRAZolam (XANAX) 0.25 MG tablet Take 0.25 mg by mouth 2 (two) times daily as needed for anxiety.   Yes [provider]  Calcium Carb-Cholecalciferol (CALCIUM-VITAMIN D) 500-200 MG-UNIT tablet Take by mouth.   Yes [provider]  cholecalciferol (VITAMIN D) 1000 units tablet Take 2,000 Units by mouth daily.   Yes [provider]  denosumab (PROLIA) 60 MG/ML SOLN Inject 60 mg into the skin every 6 (six) months.    Yes [provider]  DEXILANT 60 MG capsule TAKE 1 TABLET BY MOUTH DAILY EVERY MORNING. 10/19/16  Yes Nandigam, Venia Minks, MD  EPINEPHrine (EPIPEN 2-PAK) 0.3 mg/0.3 mL IJ SOAJ injection Inject 0.3 mg into the muscle once as needed (for severe allergic reaction).   Yes [provider]  ferumoxytol Shirlean Kelly) 510 MG/17ML SOLN injection Inject 510 mg into the vein as needed (for low iron).    Yes [provider]  fluorometholone (FML) 0.1 % ophthalmic suspension Place 1 drop into the left eye daily.    Yes [provider]  folic acid (FOLVITE) 062 MCG tablet Take 800 mcg by mouth daily.   Yes [provider]  gabapentin (NEURONTIN) 400 MG capsule Take 800 mg by mouth 2 (two) times daily.    Yes [provider]  hydroxypropyl methylcellulose / hypromellose (ISOPTO TEARS / GONIOVISC) 2.5 % ophthalmic solution Place 1-2 drops into the right eye as needed for dry eyes.    Yes [provider]  sertraline (ZOLOFT) 100 MG tablet Take 100 mg by mouth daily.  02/15/16  Yes [provider]  verapamil (CALAN-SR) 240 MG CR tablet Take 240 mg by mouth daily.   Yes [provider]  benzonatate (TESSALON) 100 MG capsule Take 1 capsule (100 mg total) by mouth every 8 (eight) hours. Patient not taking:  Reported on 02/03/2017 01/16/17   Rodell Perna A, PA-C    Scheduled Meds: . calcium-vitamin D  1 tablet Oral Daily  . cholecalciferol  2,000 Units Oral Daily  . fluorometholone  1 drop Left Eye Daily  . folic acid  3,762 mcg Oral Daily  . gabapentin  800 mg Oral BID  . ipratropium-albuterol  3 mL Nebulization TID  . [START ON 02/06/2017] pantoprazole  40 mg Intravenous Q12H  . sertraline  100 mg Oral Daily  . verapamil  240 mg Oral  Daily   Infusions: . pantoprozole (PROTONIX) infusion 8 mg/hr (02/04/17 0158)   PRN Meds: acetaminophen, albuterol, ALPRAZolam, dextromethorphan-guaiFENesin, EPINEPHrine, heparin lock flush, heparin lock flush, hydrALAZINE, ondansetron **OR** ondansetron (ZOFRAN) IV, polyvinyl alcohol, sodium chloride flush, sodium chloride flush, zolpidem   Allergies as of 02/02/2017 - Review Complete 02/02/2017  Allergen Reaction Noted  . Nutritional supplements Anaphylaxis 01/09/2011  . [name] Shortness Of Breath   . Sertraline Shortness Of Breath 11/07/2011  . [name] [alendronate sodium] Other (See Comments) 08/24/2013  . Lipitor [atorvastatin] Other (See Comments) 11/07/2011  . Pregabalin Nausea And Vomiting 01/09/2011  . [name] Nausea And Vomiting   . Wasp venom Hives 08/25/2013    Family History  Problem Relation Age of Onset  . Prostate cancer Father   . Uterine cancer Sister   . Kidney cancer Brother   . Kidney cancer Son   . Colon cancer Sister        mets from uterine cancer    Social History   Socioeconomic History  . Marital status: Widowed    Spouse name: Not on file  . Number of children: Not on file  . Years of education: Not on file  . Highest education level: Not on file  Social Needs  . Financial resource strain: Not on file  . Food insecurity - worry: Not on file  . Food insecurity - inability: Not on file  . Transportation needs - medical: Not on file  . Transportation needs - non-medical: Not on file    Occupational History  . Occupation: retired  Tobacco Use  . Smoking status: Former Smoker    Packs/day: 0.50    Years: 20.00    Pack years: 10.00    Types: Cigarettes    Last attempt to quit: 01/08/1989    Years since quitting: 28.0  . Smokeless tobacco: Never Used  Substance and Sexual Activity  . Alcohol use: No    Alcohol/week: 0.0 oz  . Drug use: No  . Sexual activity: No    Birth control/protection: Post-menopausal  Other Topics Concern  . Not on file  Social History Narrative  . Not on file    REVIEW OF SYSTEMS: Constitutional:  Per HPI ENT:  No nose bleeds Pulm: Improved shortness of breath.  Cough which is mostly nonproductive. CV:  No palpitations, no LE edema.  Chest pain. GU:  No hematuria, no frequency GI:  Per HPI Heme: Denies excessive bleeding or bruising. Transfusions:  Per HPI MS: Pain at the lower end of her right scapula when she coughs.  This is been present for 2 or 3 weeks. Neuro:  No headaches, no peripheral tingling or numbness Derm:  No itching, no rash or sores.  Endocrine:  No sweats or chills.  No polyuria or dysuria Immunization: Had her flu shot this fall. Travel:  None beyond local counties in last few months.    PHYSICAL EXAM: Vital signs in last 24 hours: Vitals:   02/04/17 0530 02/04/17 1315  BP: 134/61 (!) 105/40  Pulse: 88 72  Resp: 18 18  Temp: 98.3 F (36.8 C) 98.2 F (36.8 C)  SpO2: 98% 95%   Wt Readings from Last 3 Encounters:  02/03/17 94.7 kg (208 lb 12.4 oz)  01/16/17 94.8 kg (209 lb)  01/25/16 95 kg (209 lb 6.4 oz)    General: Obese, pale, frail and chronically ill-appearing WF. Head: No facial asymmetry or swelling.  No signs of head trauma. Eyes: No scleral icterus.  No conjunctival pallor.  Ears: Minor hearing deficit. Nose: No discharge or congestion. Mouth: Moist, pink, clear oral mucosa.  Tongue midline. Neck: No JVD.  No masses, no thyromegaly. Lungs: Clear bilaterally.  No labored breathing.   Occasional cough. Heart: RRR.  S1, S2 present.  Harsh systolic murmur. Abdomen: Soft.  Obese.  No HSM, hernias, masses, bruits.  Active bowel sounds.  Not tender.. Rectal: Deferred rectal exam. Musc/Skeltl: Spinal kyphosis.  No joint erythema or swelling. Extremities: No CCE. Neurologic: Alert.  Oriented x3.  Moves all 4s.   Skin:  Some telangectasia.   Nodes:  No cervical adenopathy.     Psych:  Cooperative, pleasant.    Intake/Output from previous day: 12/30 0701 - 12/31 0700 In: 1224.3 [I.V.:888.3; Blood:336] Out: 1700 [Urine:1700] Intake/Output this shift: No intake/output data recorded.  LAB RESULTS: Recent Labs    02/03/17 1550 02/03/17 2134 02/04/17 0857  WBC 4.7 4.5 5.6  HGB 7.9* 8.7* 8.6*  HCT 25.8* 28.7* 28.9*  PLT 247 259 254   BMET Lab Results  Component Value Date   NA 140 02/03/2017   NA 140 02/02/2017   NA 136 01/16/2017   K 4.6 02/03/2017   K 5.1 02/02/2017   K 4.6 01/16/2017   CL 112 (H) 02/03/2017   CL 110 02/02/2017   CL 105 01/16/2017   CO2 24 02/03/2017   CO2 24 02/02/2017   CO2 23 01/16/2017   GLUCOSE 102 (H) 02/03/2017   GLUCOSE 107 (H) 02/02/2017   GLUCOSE 102 (H) 01/16/2017   BUN 16 02/03/2017   BUN 26 (H) 02/02/2017   BUN 24 (H) 01/16/2017   CREATININE 1.05 (H) 02/03/2017   CREATININE 1.20 (H) 02/02/2017   CREATININE 1.20 (H) 01/16/2017   CALCIUM 7.8 (L) 02/03/2017   CALCIUM 8.3 (L) 02/02/2017   CALCIUM 8.2 (L) 01/16/2017   LFT Recent Labs    02/02/17 2330  PROT 6.3*  ALBUMIN 3.3*  AST 15  ALT 9*  ALKPHOS 86  BILITOT 0.3  BILIDIR <0.1*  IBILI NOT CALCULATED   PT/INR Lab Results  Component Value Date   INR 0.96 02/02/2017   INR 2.3 (H) 02/07/2008   INR 2.2 (H) 02/06/2008   Hepatitis Panel No results for input(s): HEPBSAG, HCVAB, HEPAIGM, HEPBIGM in the last 72 hours. C-Diff No components found for: CDIFF Lipase     Component Value Date/Time   LIPASE 46 02/02/2017 2330    Drugs of Abuse  No results found  for: LABOPIA, COCAINSCRNUR, LABBENZ, AMPHETMU, THCU, LABBARB   RADIOLOGY STUDIES: Dg Chest 2 View  Result Date: 02/02/2017 CLINICAL DATA:  83 year old female with cough. EXAM: CHEST  2 VIEW COMPARISON:  Chest radiograph dated 01/16/2017 FINDINGS: Diffuse interstitial and peribronchial prominence, likely chronic. Mild edema is not excluded. Clinical correlation is recommended. No focal consolidation, pleural effusion, or pneumothorax. There is a small hiatal hernia. No acute osseous pathology. IMPRESSION: Probable chronic bronchitic changes. Mild interstitial edema is not entirely excluded. Clinical correlation is recommended. Electronically Signed   By: Anner Crete M.D.   On: 02/02/2017 23:56     IMPRESSION:   *   Anemia due to GI blood loss in patient with history of GAVE    PLAN:     *  Plan upper endoscopy on 02/07/16.  In the meantime she can eat a heart healthy diet.  We will make her n.p.o. for the study after midnight 02/07/16  *   Does not need IV Protonix drip.  She takes once daily Dexilant at home so  here in the hospital will replace this with once daily oral Protonix.   Sophia [name]  02/04/2017, 2:56 PM Pager: [phone]

## 2017-02-05 LAB — GLUCOSE, CAPILLARY: Glucose-Capillary: 85 mg/dL (ref 65–99)

## 2017-02-05 LAB — CBC
HCT: 31 % — ABNORMAL LOW (ref 36.0–46.0)
Hemoglobin: 9.2 g/dL — ABNORMAL LOW (ref 12.0–15.0)
MCH: 27.7 pg (ref 26.0–34.0)
MCHC: 29.7 g/dL — ABNORMAL LOW (ref 30.0–36.0)
MCV: 93.4 fL (ref 78.0–100.0)
Platelets: 276 10*3/uL (ref 150–400)
RBC: 3.32 MIL/uL — ABNORMAL LOW (ref 3.87–5.11)
RDW: 19.2 % — ABNORMAL HIGH (ref 11.5–15.5)
WBC: 5.5 10*3/uL (ref 4.0–10.5)

## 2017-02-05 NOTE — Progress Notes (Signed)
  PROGRESS NOTE  Sophia [name] PRA:[id number] DOB: 1928-09-02 DOA: 02/02/2017 PCP: [name], MD  Brief Narrative: 72yow PMH chronic GIB, chronic blood loss anemia secondary to GAVE presented with weakness and mild SOB. Admitted for PRBC transfusion. Hgb stable, no bleeding; GI rec EGD 1/2. Can likely go home 1/2.  Assessment/Plan Acute on chronic blood loss anemia, symptomatic, secondary to chronic GIB secondary to GAVE, followed by GI Dr and heme Dr. [name] her note 12/27 for detailed history. Anemia has been investigated with nurmerous endoscopies, bone marrow biopsy and has had recurrent iron tx and PRBC transfusion. S/p Feraheme in ED, s/p 2 units PRBC with appropriate response - Hgb remains stable, no evidence of ongoing bleeding 1/1 - GI plans EGD 1/2. Likely home 1/2 if stable.  Essential HTN - remains stable 1/1  CKD stage III - at baseline   Chronic combined systolic/diastolic CHF. Not on diuretics at home. - appears compensated 1/1; I/O +850  Aortic stenosis  - asymptomatic  DVT prophylaxis: SCDs Code Status: full Family Communication: son at bedside 1/1 Disposition Plan: home    [name], MD  [facility] Direct contact: [phone] --[place] [place]  --[email or web address]; password [email or web address]  7PM-7AM contact night coverage as above 02/05/2017, 4:30 PM  LOS: 2 days   Consultants:  GI  Procedures:  2 units PRBC 12/30  Antimicrobials:    Interval history/Subjective: Pain with cough, otherwise feels fine.  Objective: Vitals:  Vitals:   02/05/17 1458 02/05/17 1517  BP:  (!) 102/53  Pulse:  80  Resp:  20  Temp:  98.3 F (36.8 C)  SpO2: 94% 95%    Exam:  Constitutional:   . Appears calm and comfortable Respiratory:  . CTA bilaterally, no w/r/r.  . Respiratory effort normal.  Cardiovascular:  . RRR, no m/r/g . No LE extremity edema   Psychiatric:  . Mental status o Mood, affect appropriate  I have personally reviewed  the following:  Filed Weights   02/02/17 2316 02/03/17 1550  Weight: 94.8 kg (209 lb) 94.7 kg (208 lb 12.4 oz)   Weight change:   Labs:  Hgb 6.3 >> 7.9 > 8.7 > 8.6 >> 9.2  Scheduled Meds: . calcium-vitamin D  1 tablet Oral Daily  . cholecalciferol  2,000 Units Oral Daily  . fluorometholone  1 drop Left Eye Daily  . folic acid  4,600 mcg Oral Daily  . gabapentin  800 mg Oral BID  . ipratropium-albuterol  3 mL Nebulization TID  . pantoprazole  40 mg Oral Q0600  . sertraline  100 mg Oral Daily  . verapamil  240 mg Oral Daily   Continuous Infusions:   Principal Problem:   GIB (gastrointestinal bleeding) Active Problems:   Iron deficiency anemia due to chronic blood loss   ANXIETY DEPRESSION   Essential hypertension   GASTROESOPHAGEAL REFLUX DISEASE   Aortic stenosis   CKD (chronic kidney disease), stage III (H[facility])   Chronic combined systolic (congestive) and diastolic (congestive) heart failure ([place])   LOS: 2 days

## 2017-02-06 ENCOUNTER — Inpatient Hospital Stay (HOSPITAL_COMMUNITY): Payer: Medicare Other | Admitting: Certified Registered Nurse Anesthetist

## 2017-02-06 ENCOUNTER — Encounter (HOSPITAL_COMMUNITY): Payer: Self-pay | Admitting: Internal Medicine

## 2017-02-06 ENCOUNTER — Encounter (HOSPITAL_COMMUNITY)
Admission: EM | Disposition: A | Discharge status: Skilled Nursing Facility | Payer: Self-pay | Source: Home / Self Care | Attending: Family Medicine

## 2017-02-06 DIAGNOSIS — D500 Iron deficiency anemia secondary to blood loss (chronic): Secondary | ICD-10-CM

## 2017-02-06 DIAGNOSIS — K31819 Angiodysplasia of stomach and duodenum without bleeding: Secondary | ICD-10-CM

## 2017-02-06 DIAGNOSIS — K449 Diaphragmatic hernia without obstruction or gangrene: Secondary | ICD-10-CM

## 2017-02-06 DIAGNOSIS — Q399 Congenital malformation of esophagus, unspecified: Secondary | ICD-10-CM

## 2017-02-06 HISTORY — PX: ESOPHAGOGASTRODUODENOSCOPY: SHX5428

## 2017-02-06 LAB — CBC
HEMATOCRIT: 27.8 % — AB (ref 36.0–46.0)
HEMOGLOBIN: 8.3 g/dL — AB (ref 12.0–15.0)
MCH: 27.9 pg (ref 26.0–34.0)
MCHC: 29.9 g/dL — AB (ref 30.0–36.0)
MCV: 93.6 fL (ref 78.0–100.0)
Platelets: 259 10*3/uL (ref 150–400)
RBC: 2.97 MIL/uL — ABNORMAL LOW (ref 3.87–5.11)
RDW: 19 % — ABNORMAL HIGH (ref 11.5–15.5)
WBC: 5 10*3/uL (ref 4.0–10.5)

## 2017-02-06 LAB — GLUCOSE, CAPILLARY: Glucose-Capillary: 84 mg/dL (ref 65–99)

## 2017-02-06 SURGERY — EGD (ESOPHAGOGASTRODUODENOSCOPY)
Anesthesia: Monitor Anesthesia Care

## 2017-02-06 MED ORDER — SODIUM CHLORIDE 0.9 % IV SOLN
INTRAVENOUS | Status: DC | PRN
  Administered 2017-02-06: 12:00:00 via INTRAVENOUS

## 2017-02-06 MED ORDER — SODIUM CHLORIDE 0.9 % IV SOLN
INTRAVENOUS | Status: DC
  Administered 2017-02-06: 11:00:00 via INTRAVENOUS

## 2017-02-06 MED ORDER — PHENYLEPHRINE HCL 10 MG/ML IJ SOLN
INTRAMUSCULAR | Status: DC | PRN
  Administered 2017-02-06 (×2): 120 ug via INTRAVENOUS

## 2017-02-06 MED ORDER — PROPOFOL 500 MG/50ML IV EMUL
INTRAVENOUS | Status: DC | PRN
  Administered 2017-02-06: 75 ug/kg/min via INTRAVENOUS

## 2017-02-06 MED ORDER — PROPOFOL 10 MG/ML IV BOLUS
INTRAVENOUS | Status: DC | PRN
  Administered 2017-02-06: 30 mg via INTRAVENOUS

## 2017-02-06 NOTE — Progress Notes (Signed)
SATURATION QUALIFICATIONS: (This note is used to comply with regulatory documentation for home oxygen)  Patient Saturations on Room Air at Rest = 85%  Patient Saturations on Room Air while Ambulating = 84%  Patient Saturations on 2 Liters of oxygen while Ambulating = 92%  Please briefly explain why patient needs home oxygen:Desated

## 2017-02-06 NOTE — Progress Notes (Signed)
CSW sent referral to [facility] to see if patient would qualify for home first program.  Sophia [name] [facility] [phone]

## 2017-02-06 NOTE — Interval H&P Note (Signed)
History and Physical Interval Note: For EGD today.  History of GAVE with acute on chronic blood loss anemia Blood counts improved after transfusion down about 1 g today without overt bleeding Patient denies new complaint The nature of the procedure, as well as the risks, benefits, and alternatives were carefully and thoroughly reviewed with the patient and her son. Ample time for discussion and questions allowed. The patient and son understood, were satisfied, and agreed to proceed.    02/06/2017 11:49 AM  [name]  has presented today for surgery, with the diagnosis of Blood loss anemia.  History GAVE.  The various methods of treatment have been discussed with the patient and family. After consideration of risks, benefits and other options for treatment, the patient has consented to  Procedure(s): ESOPHAGOGASTRODUODENOSCOPY (EGD) (N/A) as a surgical intervention .  The patient's history has been reviewed, patient examined, no change in status, stable for surgery.  I have reviewed the patient's chart and labs.  Questions were answered to the patient's satisfaction.     Lajuan Lines Merrell Rettinger

## 2017-02-06 NOTE — Transfer of Care (Signed)
Immediate Anesthesia Transfer of Care Note  Patient: Sophia [name]  Procedure(s) Performed: ESOPHAGOGASTRODUODENOSCOPY (EGD) (N/A )  Patient Location: Endoscopy Unit  Anesthesia Type:MAC  Level of Consciousness: drowsy  Airway & Oxygen Therapy: Patient Spontanous Breathing and Patient connected to nasal cannula oxygen  Post-op Assessment: Report given to RN and Post -op Vital signs reviewed and stable  Post vital signs: Reviewed and stable  Last Vitals:  Vitals:   02/06/17 1056 02/06/17 1225  BP: (!) 123/99 123/61  Pulse:  88  Resp: 18 18  Temp: 36.8 C   SpO2: 97% 92%    Last Pain:  Vitals:   02/06/17 1056  TempSrc: Oral  PainSc: 0-No pain      Patients Stated Pain Goal: 3 (35/59/74 1638)  Complications: No apparent anesthesia complications

## 2017-02-06 NOTE — Evaluation (Signed)
Occupational Therapy Evaluation Patient Details Name: Sophia [name] MRN: [id number] DOB: 1928/05/12 Today's Date: 02/06/2017    History of Present Illness 79yow PMH chronic GIB, chronic blood loss anemia secondary to GAVE (Gastric antral vascular ectasia) presented with weakness and mild SOB. Admitted for PRBC transfusion. Hgb stable, no bleeding; GI rec EGD 1/2.    Clinical Impression   Patient presenting with decreased I in self care, balance, safety awareness, functional mobility/transfers.  Patient requiring some assist from son  PTA for self care. Son assisting with meals and home management tasks as well.  Patient currently functioning min - max A overall. Patient will benefit from acute OT to increase overall independence in the areas of ADLs, functional mobility, and safety awareness in order to safely discharge to next venue of care. Pt would benefit from short term SNF rehab in order to increase I with functional performance.     Follow Up Recommendations  SNF;Supervision/Assistance - 24 hour    Equipment Recommendations  Other (comment)(defer to next venue of care. However, if returning home pt would likely need [facility] and hospital bed.)    Recommendations for Other Services       Precautions / Restrictions Precautions Precautions: Fall Restrictions Weight Bearing Restrictions: No      Mobility Bed Mobility Overal bed mobility: Needs Assistance Bed Mobility: Supine to Sit     Supine to sit: Min assist     General bed mobility comments: MIn handheld assist to pull to sit  Transfers      General transfer comment: unable to perform secondary to fatigue    Balance Overall balance assessment: Needs assistance   Sitting balance-Leahy Scale: Poor         ADL either performed or assessed with clinical judgement   ADL Overall ADL's : Needs assistance/impaired     Grooming: Set up;Supervision/safety;Sitting   Upper Body Bathing: Minimal assistance;Sitting   Lower Body Bathing: Moderate assistance   Upper Body Dressing : Set up;Minimal assistance;Sitting   Lower Body Dressing: Maximal assistance       Pt extremely fatigued from seated position on EOB for 4 minutes with min A for balance.                   Pertinent Vitals/Pain Pain Assessment: Faces Faces Pain Scale: Hurts little more Pain Location: abdominal soreness Pain Descriptors / Indicators: Aching;Discomfort;Dull Pain Intervention(s): Monitored during session;Limited activity within patient's tolerance     Hand Dominance Right   Extremity/Trunk Assessment Upper Extremity Assessment Upper Extremity Assessment: Generalized weakness   Lower Extremity Assessment Lower Extremity Assessment: Generalized weakness       Communication Communication Communication: HOH   Cognition Arousal/Alertness: Awake/alert Behavior During Therapy: WFL for tasks assessed/performed Overall Cognitive Status: Within Functional Limits for tasks assessed                       Home Living Family/patient expects to be discharged to:: Private residence Living Arrangements: Alone Available Help at Discharge: Family;Friend(s);Available PRN/intermittently Type of Home: House       Home Layout: One level     Bathroom Shower/Tub: Teacher, early years/pre: Handicapped height Bathroom Accessibility: Yes How Accessible: Accessible via walker Home Equipment: [place] - 2 wheels;Walker - 4 wheels   Additional Comments: Uses lift chair      Prior Functioning/Environment Level of Independence: Needs assistance  Gait / Transfers Assistance Needed: RW full-time in home; only gets out for appointments ADL's / Homemaking  Assistance Needed: Independent, however, son quietly expressed concern with her recent ability to maintain hygeine. He reports, " sometimes she needs help."            OT Problem List: Decreased strength;Impaired balance (sitting and/or  standing);Decreased cognition;Pain;Decreased safety awareness;Cardiopulmonary status limiting activity;Decreased activity tolerance;Decreased knowledge of use of DME or AE      OT Treatment/Interventions: Self-care/ADL training;Therapeutic exercise;Patient/family education;Balance training;Energy conservation;Therapeutic activities;DME and/or AE instruction    OT Goals(Current goals can be found in the care plan section) Acute Rehab OT Goals Patient Stated Goal: to go home OT Goal Formulation: With patient/family Time For Goal Achievement: 02/20/17 Potential to Achieve Goals: Fair ADL Goals Pt Will Perform Grooming: with set-up;with supervision Pt Will Perform Upper Body Bathing: with set-up;with supervision Pt Will Perform Lower Body Bathing: with min assist Pt Will Perform Upper Body Dressing: with set-up;with supervision;sitting Pt Will Perform Lower Body Dressing: with min assist Pt Will Transfer to Toilet: with min assist Pt Will Perform Toileting - Clothing Manipulation and hygiene: with min assist  OT Frequency: Min 2X/week   Barriers to D/C:    Son has been providing assistance for many months prior to admission but is not able to assist 24/7 with the current level of assistance pt is needing          AM-PAC PT "6 Clicks" Daily Activity     Outcome Measure Help from another person eating meals?: A Little Help from another person taking care of personal grooming?: A Little Help from another person toileting, which includes using toliet, bedpan, or urinal?: Total Help from another person bathing (including washing, rinsing, drying)?: A Lot Help from another person to put on and taking off regular upper body clothing?: A Little Help from another person to put on and taking off regular lower body clothing?: A Lot 6 Click Score: 14   End of Session Equipment Utilized During Treatment: Oxygen  Activity Tolerance: Patient limited by fatigue Patient left: in bed;with call  bell/phone within reach;with family/visitor present  OT Visit Diagnosis: Repeated falls (R29.6);Muscle weakness (generalized) (M62.81)                Time: 0973-5329 OT Time Calculation (min): 26 min Charges:  OT General Charges $OT Visit: 1 Visit OT Evaluation $OT Eval Moderate Complexity: 1 Mod OT Treatments $Therapeutic Activity: 8-22 mins G-Codes:     Verna Hamon P, MS, OTR/L, CBIS 02/06/2017, 2:41 PM

## 2017-02-06 NOTE — Evaluation (Signed)
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 Admitted for PRBC transfusion. Hgb stable, no bleeding; GI rec EGD 1/2. Can likely go home 1/2.  Clinical Impression   Pt admitted with above diagnosis. Pt currently with functional limitations due to the deficits listed below (see PT Problem List). Living alone prior to admission, and getting a considerable amount of assist from son in the weeks leading up to this admission; Pt is fiercely independent and very much wants to go home;   Currently, she became winded with pivot steps bed to chair on Room Air; O2 sats decr to 86% after 10-15 minutes on room air, and minimal amb; re-started supplemental O2 and notified RN;  It is possible, even likely, that she needs supplemental O2 at home; Will need an O2 qualifying walk (nursing or therapies can do this) -- spoke to RN and nurse tech about this as well;   Ms. Sica would benefit from SNF level consistent PT/OT at dc; she also very much want to dc home, and it looks like she may qualify for Home Instead through [facility] and [place] -- spoke to Camera operator, who will relay this in progression rounds;   Pt will benefit from skilled PT to increase their independence and safety with mobility to allow discharge to the venue listed below.       Follow Up Recommendations SNF    Equipment Recommendations  Wheelchair (measurements PT);Wheelchair cushion (measurements PT)(worth considering hospital bed; likely need for supp O2)    Recommendations for Other Services OT consult(ordered per protocol; [facility] liaison)     Precautions / Restrictions Precautions Precautions: Fall      Mobility  Bed Mobility Overal bed mobility: Needs Assistance Bed Mobility: Supine to  Sit     Supine to sit: Min assist     General bed mobility comments: MIn handheld assist to pull to sit  Transfers Overall transfer level: Needs assistance Equipment used: Rolling walker (2 wheeled) Transfers: Sit to/from Stand Sit to Stand: Min guard         General transfer comment: From elevated surface; Tends to pull up on RW despite cues -- this is habit for her  Ambulation/Gait Ambulation/Gait assistance: Min guard Ambulation Distance (Feet): 3 Feet Assistive device: Rolling walker (2 wheeled) Gait Pattern/deviations: Shuffle     General Gait Details: Quite winded with short distance walking on Room Air; heavy dependence on RW  Stairs            Wheelchair Mobility    Modified Rankin (Stroke Patients Only)       Balance Overall balance assessment: Needs assistance           Standing balance-Leahy Scale: Poor                               Pertinent Vitals/Pain Pain Assessment: Faces Faces Pain Scale: Hurts a little bit Pain Location: abdominal soreness Pain Descriptors / Indicators: Aching Pain Intervention(s): Monitored during session    Home Living Family/patient expects to be discharged to:: Private residence Living Arrangements: Alone Available Help at Discharge: Family;Friend(s);Available PRN/intermittently(Son stays a few hours a day) Type of Home: House Home Access: Ramped entrance     Home Layout: One level Home Equipment: Gilford Rile -  2 wheels;Walker - 4 wheels Additional Comments: Uses lift chair    Prior Function Level of Independence: Needs assistance   Gait / Transfers Assistance Needed: RW full-time in home; only gets out for appointments  ADL's / Homemaking Assistance Needed: Independent, however, son quietly expressed concern with her recent ability to maintain hygeine        Hand Dominance        Extremity/Trunk Assessment   Upper Extremity Assessment Upper Extremity Assessment: Generalized  weakness;Defer to OT evaluation    Lower Extremity Assessment Lower Extremity Assessment: Generalized weakness    Cervical / Trunk Assessment Cervical / Trunk Assessment: Kyphotic  Communication   Communication: HOH  Cognition Arousal/Alertness: Awake/alert Behavior During Therapy: WFL for tasks assessed/performed Overall Cognitive Status: Within Functional Limits for tasks assessed                                 General Comments: She fiercely values her independence in her home      General Comments General comments (skin integrity, edema, etc.): Lengthy discussion with pt and son re: dc planning and follow up rehab, nursing, and custodial recommendations    Exercises     Assessment/Plan    PT Assessment Patient needs continued PT services  PT Problem List Decreased strength;Decreased activity tolerance;Decreased balance;Decreased mobility;Decreased knowledge of use of DME;Decreased safety awareness;Decreased knowledge of precautions;Cardiopulmonary status limiting activity;Obesity;Pain       PT Treatment Interventions DME instruction;Gait training;Functional mobility training;Therapeutic activities;Therapeutic exercise;Balance training;Patient/family education;Wheelchair mobility training    PT Goals (Current goals can be found in the Care Plan section)  Acute Rehab PT Goals Patient Stated Goal: VERY much wants to be home PT Goal Formulation: With patient Time For Goal Achievement: 02/20/17 Potential to Achieve Goals: Good    Frequency Min 3X/week   Barriers to discharge Decreased caregiver support Son is able to and has been providing near 24 hour support for a few weeks leading to this admission; It is time to look into more services in the home to help maintain safety and hygeine    Co-evaluation               AM-PAC PT "6 Clicks" Daily Activity  Outcome Measure Difficulty turning over in bed (including adjusting bedclothes, sheets and  blankets)?: None Difficulty moving from lying on back to sitting on the side of the bed? : A Little Difficulty sitting down on and standing up from a chair with arms (e.g., wheelchair, bedside commode, etc,.)?: A Little Help needed moving to and from a bed to chair (including a wheelchair)?: A Little Help needed walking in hospital room?: A Lot Help needed climbing 3-5 steps with a railing? : Total 6 Click Score: 16    End of Session Equipment Utilized During Treatment: Gait belt;Oxygen Activity Tolerance: Patient tolerated treatment well Patient left: in chair;with call bell/phone within reach;with family/visitor present Nurse Communication: Mobility status;Other (comment)(O2 sats, poss need for O2 qualifying walk) PT Visit Diagnosis: Other abnormalities of gait and mobility (R26.89);Muscle weakness (generalized) (M62.81);Other (comment)(decr functional capacity)    Time: 7628-3151 PT Time Calculation (min) (ACUTE ONLY): 32 min   Charges:   PT Evaluation $PT Eval Moderate Complexity: 1 Mod PT Treatments $Therapeutic Activity: 8-22 mins   PT G Codes:        Roney Marion, PT  Acute Rehabilitation Services Pager [phone] Office [phone]   Colletta [place] 02/06/2017, 9:37 AM

## 2017-02-06 NOTE — Consult Note (Signed)
            [facility] CM Primary Care Navigator  02/06/2017  DIAMANTINA EDINGER [birth year] [phone]   Went to see patient at the bedsideto identify possible discharge needs but she is off the unit for a procedure in Endo per staff report. (ESOPHAGOGASTRODUODENOSCOPY)  Will attempt to see patient at another time when she is available in the room.    Addendum (02/08/17):   Went back to see patient at the bedside to identify possible discharge needs but she was transported by [facility] out for discharge.  Patient is being discharged to skilled nursing facility (SNF- [facility]) per therapy recommendation, for rehabilitation prior to returning home.  Per chart review, patient's son had reported that he is currently unable to care for patient at their home given patient's current physical needs and fall risk.  Patient was also assessed for [facility]) but they do not feel she would be successful since she is reluctant to let anyone into her home.  Patient's primary care provider's office is listed as providing transition of care (TOC) follow-up.   For additional questions please contact:  Edwena Felty A. Xee Hollman, BSN, RN-BC [facility] PRIMARY CARE Navigator Cell: [phone]

## 2017-02-06 NOTE — Progress Notes (Signed)
Patient ID: Sophia [name], female   DOB: 1928/03/10, 84 y.o.   MRN: [id number]  PROGRESS NOTE    Sophia [name]  BCW:[id number] DOB: 03-May-1928 DOA: 02/02/2017 PCP: [name], MD   Brief Narrative:  84 year old female with history of hypertension, hyperlipidemia, GERD, depression, anxiety, anemia, diverticulosis, GI bleeding due to gastric antral vascular ectasia, CHF with EF of 40%, chronic kidney disease stage III presented with rectal bleeding.  She was transfused 2 units of PRBC on admission because of hemoglobin of 6.3.  GI was consulted. Assessment & Plan:   Principal Problem:   GIB (gastrointestinal bleeding) Active Problems:   Iron deficiency anemia due to chronic blood loss   ANXIETY DEPRESSION   Essential hypertension   GASTROESOPHAGEAL REFLUX DISEASE   Aortic stenosis   CKD (chronic kidney disease), stage III (HCC)   Chronic combined systolic (congestive) and diastolic (congestive) heart failure (HCC)   Anemia due to chronic blood loss   Acute on chronic blood loss anemia, symptomatic, secondary to chronic intermittent GI bleeding secondary to GAVE  Anemia has been investigated with nurmerous endoscopies, bone marrow biopsy and has had recurrent iron treatment and PRBC transfusion. S/p Feraheme in ED, s/p 2 units PRBC on admission -Hemoglobin 8.3 today.   -Status post EGD today which showed GAVE treated with APC. -Repeat a.m. hemoglobin.  Essential HTN - remains stable.  Continue verapamil  CKD stage III - at baseline.  Repeat a.m. labs  Chronic combined systolic/diastolic CHF. Not on diuretics at home. - appears compensated  -Outpatient follow-up  Aortic stenosis  - asymptomatic -Outpatient follow-up  Generalized conditioning -PT recommends nursing home placement.  Consult social worker   DVT prophylaxis: SCDs Code Status: Full Family Communication: None at bedside Disposition Plan: Nursing home in 1-2 days  Consultants: GI  Procedures:    EGD on 02/06/2017 Impression:               - Tortuous esophagus.                           - 9 cm hiatal hernia.                           - Gastric antral vascular ectasia. Treated with                            argon plasma coagulation (APC).                           - Normal examined duodenum.                           - No specimens collected.    Antimicrobials: None   Subjective: Patient seen and examined at bedside.  She denies any overnight fever, nausea or vomiting. Complains of mild abdominal pain.  Objective: Vitals:   02/06/17 1056 02/06/17 1225 02/06/17 1233 02/06/17 1236  BP: (!) 123/99 123/61 133/68   Pulse:  88 81   Resp: 18 18 (!) 24   Temp: 98.3 F (36.8 C) 98.2 F (36.8 C)    TempSrc: Oral     SpO2: 97% 92% 92% 91%  Weight:      Height:        Intake/Output Summary (Last 24 hours) at 02/06/2017 1257 Last data filed  at 02/06/2017 1218 Gross per 24 hour  Intake 520 ml  Output 900 ml  Net -380 ml   Filed Weights   02/02/17 2316 02/03/17 1550  Weight: 94.8 kg (209 lb) 94.7 kg (208 lb 12.4 oz)    Examination:  General exam: Appears calm and comfortable  Respiratory system: Bilateral decreased breath sound at bases with scattered crackles Cardiovascular system: S1 & S2 heard, rate controlled  gastrointestinal system: Abdomen is nondistended, soft and nontender. Normal bowel sounds heard. Extremities: No cyanosis, clubbing, edema    Data Reviewed: I have personally reviewed following labs and imaging studies  CBC: Recent Labs  Lab 02/03/17 1550 02/03/17 2134 02/04/17 0857 02/05/17 0821 02/06/17 0410  WBC 4.7 4.5 5.6 5.5 5.0  HGB 7.9* 8.7* 8.6* 9.2* 8.3*  HCT 25.8* 28.7* 28.9* 31.0* 27.8*  MCV 91.5 92.3 92.3 93.4 93.6  PLT 247 259 254 276 975   Basic Metabolic Panel: Recent Labs  Lab 02/02/17 2329 02/03/17 1550  NA 140 140  K 5.1 4.6  CL 110 112*  CO2 24 24  GLUCOSE 107* 102*  BUN 26* 16  CREATININE 1.20* 1.05*  CALCIUM 8.3*  7.8*   GFR: Estimated Creatinine Clearance: 40.4 mL/min (A) (by C-G formula based on SCr of 1.05 mg/dL (H)). Liver Function Tests: Recent Labs  Lab 02/02/17 2330  AST 15  ALT 9*  ALKPHOS 86  BILITOT 0.3  PROT 6.3*  ALBUMIN 3.3*   Recent Labs  Lab 02/02/17 2330  LIPASE 46   No results for input(s): AMMONIA in the last 168 hours. Coagulation Profile: Recent Labs  Lab 02/02/17 2330  INR 0.96   Cardiac Enzymes: No results for input(s): CKTOTAL, CKMB, CKMBINDEX, TROPONINI in the last 168 hours. BNP (last 3 results) No results for input(s): PROBNP in the last 8760 hours. HbA1C: No results for input(s): HGBA1C in the last 72 hours. CBG: Recent Labs  Lab 02/02/17 2333 02/03/17 1200 02/04/17 0747 02/05/17 0817 02/06/17 0750  GLUCAP 103* 96 84 85 84   Lipid Profile: No results for input(s): CHOL, HDL, LDLCALC, TRIG, CHOLHDL, LDLDIRECT in the last 72 hours. Thyroid Function Tests: No results for input(s): TSH, T4TOTAL, FREET4, T3FREE, THYROIDAB in the last 72 hours. Anemia Panel: No results for input(s): VITAMINB12, FOLATE, FERRITIN, TIBC, IRON, RETICCTPCT in the last 72 hours. Sepsis Labs: No results for input(s): PROCALCITON, LATICACIDVEN in the last 168 hours.  No results found for this or any previous visit (from the past 240 hour(s)).       Radiology Studies: No results found.      Scheduled Meds: . calcium-vitamin D  1 tablet Oral Daily  . cholecalciferol  2,000 Units Oral Daily  . fluorometholone  1 drop Left Eye Daily  . folic acid  3,005 mcg Oral Daily  . gabapentin  800 mg Oral BID  . ipratropium-albuterol  3 mL Nebulization TID  . pantoprazole  40 mg Oral Q0600  . sertraline  100 mg Oral Daily  . verapamil  240 mg Oral Daily   Continuous Infusions:   LOS: 3 days        Aline August, MD [facility] Pager [phone]  If 7PM-7AM, please contact night-coverage [email or web address] Password [facility] 02/06/2017, 12:57 PM

## 2017-02-06 NOTE — Anesthesia Procedure Notes (Signed)
Procedure Name: MAC Date/Time: 02/06/2017 11:51 AM Performed by: Leonor Liv, CRNA

## 2017-02-06 NOTE — Discharge Instructions (Signed)

## 2017-02-06 NOTE — Anesthesia Preprocedure Evaluation (Addendum)
Anesthesia Evaluation  Patient identified by MRN, date of birth, ID band Patient awake    Reviewed: Allergy & Precautions, NPO status , Patient's Chart, lab work & pertinent test results  Airway Mallampati: II  TM Distance: >3 FB Neck ROM: Full    Dental  (+) Edentulous Upper, Edentulous Lower   Pulmonary former smoker,     + decreased breath sounds      Cardiovascular hypertension, +CHF  + Valvular Problems/Murmurs AS  Rhythm:Regular Rate:Normal - Systolic murmurs    Neuro/Psych PSYCHIATRIC DISORDERS Anxiety Depression  Neuromuscular disease    GI/Hepatic hiatal hernia, GERD  ,  Endo/Other  negative endocrine ROS  Renal/GU Renal disease     Musculoskeletal  (+) Arthritis ,   Abdominal (+) + obese,   Peds  Hematology   Anesthesia Other Findings - HLD  Reproductive/Obstetrics                           Lab Results  Component Value Date   WBC 5.0 02/06/2017   HGB 8.3 (L) 02/06/2017   HCT 27.8 (L) 02/06/2017   MCV 93.6 02/06/2017   PLT 259 02/06/2017   Lab Results  Component Value Date   CREATININE 1.05 (H) 02/03/2017   BUN 16 02/03/2017   NA 140 02/03/2017   K 4.6 02/03/2017   CL 112 (H) 02/03/2017   CO2 24 02/03/2017   Lab Results  Component Value Date   INR 0.96 02/02/2017   INR 2.3 (H) 02/07/2008   INR 2.2 (H) 02/06/2008   EKG: normal sinus rhythm.  Anesthesia Physical Anesthesia Plan  ASA: III  Anesthesia Plan: MAC   Post-op Pain Management:    Induction: Intravenous  PONV Risk Score and Plan: Propofol infusion  Airway Management Planned: Natural Airway and Nasal Cannula  Additional Equipment: None  Intra-op Plan:   Post-operative Plan:   Informed Consent:   Plan Discussed with: CRNA  Anesthesia Plan Comments:         Anesthesia Quick Evaluation

## 2017-02-06 NOTE — Anesthesia Postprocedure Evaluation (Signed)
Anesthesia Post Note  Patient: Sophia [name]  Procedure(s) Performed: ESOPHAGOGASTRODUODENOSCOPY (EGD) (N/A )     Patient location during evaluation: PACU Anesthesia Type: MAC Level of consciousness: awake and alert Pain management: pain level controlled Vital Signs Assessment: post-procedure vital signs reviewed and stable Respiratory status: spontaneous breathing, nonlabored ventilation, respiratory function stable and patient connected to nasal cannula oxygen Cardiovascular status: stable and blood pressure returned to baseline Postop Assessment: no apparent nausea or vomiting Anesthetic complications: no    Last Vitals:  Vitals:   02/06/17 1233 02/06/17 1236  BP: 133/68   Pulse: 81   Resp: (!) 24   Temp:    SpO2: 92% 91%    Last Pain:  Vitals:   02/06/17 1056  TempSrc: Oral  PainSc: 0-No pain                 Effie Berkshire

## 2017-02-06 NOTE — Op Note (Signed)
[facility] Patient Name: Sophia [name] Procedure Date : 02/06/2017 MRN: [id number] Attending MD: Jerene Bears , MD Date of Birth: 1928/10/17 CSN: [id number] Age: 84 Admit Type: Inpatient Procedure:                Upper GI endoscopy Indications:              Iron deficiency anemia secondary to chronic blood                            loss, Heme positive stool, Watermelon stomach (GAVE                            syndrome) Providers:                Lajuan Lines. Hilarie Fredrickson, MD, Zenon Mayo, RN, Elna Breslow,                            RN, Trixie Deis, CRNA Referring MD:             [facility] Medicines:                Monitored Anesthesia Care Complications:            No immediate complications. Estimated Blood Loss:     Estimated blood loss: none. Procedure:                Pre-Anesthesia Assessment:                           - Prior to the procedure, a History and Physical                            was performed, and patient medications and                            allergies were reviewed. The patient's tolerance of                            previous anesthesia was also reviewed. The risks                            and benefits of the procedure and the sedation                            options and risks were discussed with the patient.                            All questions were answered, and informed consent                            was obtained. Prior Anticoagulants: The patient has                            taken no previous anticoagulant or antiplatelet  agents. ASA Grade Assessment: III - A patient with                            severe systemic disease. After reviewing the risks                            and benefits, the patient was deemed in                            satisfactory condition to undergo the procedure.                           After obtaining informed consent, the endoscope was   passed under direct vision. Throughout the                            procedure, the patient's blood pressure, pulse, and                            oxygen saturations were monitored continuously. The                            EG-[id number] ([id number]) scope was introduced through the                            mouth, and advanced to the second part of duodenum.                            The upper GI endoscopy was accomplished without                            difficulty. The patient tolerated the procedure                            well. Scope In: Scope Out: Findings:      The examined esophagus was significantly tortuous.      A 9 cm (31-40 cm from the incisors) hiatal hernia was present.      Moderate gastric antral vascular ectasia was present in the gastric       antrum. Fulguration to ablate the lesion to prevent bleeding by argon       plasma at 1 liter/minute and 20 watts was successful.      The exam of the stomach was otherwise normal.      The examined duodenum was normal. Impression:               - Tortuous esophagus.                           - 9 cm hiatal hernia.                           - Gastric antral vascular ectasia. Treated with                            argon plasma coagulation (APC).                           -  Normal examined duodenum.                           - No specimens collected. Moderate Sedation:      N/A Recommendation:           - Patient has a contact number available for                            emergencies. The signs and symptoms of potential                            delayed complications were discussed with the                            patient. Return to normal activities tomorrow.                            Written discharge instructions were provided to the                            patient.                           - Resume previous diet.                           - Continue present medications including once daily                             PPI.                           - Repeat upper endoscopy as needed for retreatment.                           - Monitor Hgb and if further decline tomorrow would                            given additional blood transfusion before discharge.                           - Repeat CBC 1 week after discharge.                           - GI office followup is appropriate for continuity. Procedure Code(s):        --- Professional ---                           [phone], Esophagogastroduodenoscopy, flexible,                            transoral; with control of bleeding, any method Diagnosis Code(s):        --- Professional ---                           Q39.9, Congenital malformation of  esophagus,                            unspecified                           K44.9, Diaphragmatic hernia without obstruction or                            gangrene                           K31.819, Angiodysplasia of stomach and duodenum                            without bleeding                           D50.0, Iron deficiency anemia secondary to blood                            loss (chronic)                           R19.5, Other fecal abnormalities CPT copyright 2016 [employer]. All rights reserved. The codes documented in this report are preliminary and upon coder review may  be revised to meet current compliance requirements. Jerene Bears, MD 02/06/2017 12:31:34 PM This report has been signed electronically. Number of Addenda: 0

## 2017-02-07 ENCOUNTER — Other Ambulatory Visit: Payer: Self-pay | Admitting: Nurse Practitioner

## 2017-02-07 ENCOUNTER — Other Ambulatory Visit: Payer: Self-pay

## 2017-02-07 ENCOUNTER — Telehealth: Payer: Self-pay

## 2017-02-07 DIAGNOSIS — K31819 Angiodysplasia of stomach and duodenum without bleeding: Secondary | ICD-10-CM

## 2017-02-07 LAB — CBC WITH DIFFERENTIAL/PLATELET
BASOS PCT: 0 %
Basophils Absolute: 0 10*3/uL (ref 0.0–0.1)
EOS PCT: 1 %
Eosinophils Absolute: 0.1 10*3/uL (ref 0.0–0.7)
HEMATOCRIT: 26.8 % — AB (ref 36.0–46.0)
HEMOGLOBIN: 7.9 g/dL — AB (ref 12.0–15.0)
LYMPHS PCT: 9 %
Lymphs Abs: 0.5 10*3/uL — ABNORMAL LOW (ref 0.7–4.0)
MCH: 28 pg (ref 26.0–34.0)
MCHC: 29.5 g/dL — ABNORMAL LOW (ref 30.0–36.0)
MCV: 95 fL (ref 78.0–100.0)
MONOS PCT: 11 %
Monocytes Absolute: 0.6 10*3/uL (ref 0.1–1.0)
Neutro Abs: 3.9 10*3/uL (ref 1.7–7.7)
Neutrophils Relative %: 79 %
Platelets: 245 10*3/uL (ref 150–400)
RBC: 2.82 MIL/uL — AB (ref 3.87–5.11)
RDW: 18.4 % — ABNORMAL HIGH (ref 11.5–15.5)
WBC: 5.1 10*3/uL (ref 4.0–10.5)

## 2017-02-07 LAB — PREPARE RBC (CROSSMATCH)

## 2017-02-07 LAB — BASIC METABOLIC PANEL
ANION GAP: 6 (ref 5–15)
BUN: 15 mg/dL (ref 6–20)
CHLORIDE: 106 mmol/L (ref 101–111)
CO2: 22 mmol/L (ref 22–32)
Calcium: 8 mg/dL — ABNORMAL LOW (ref 8.9–10.3)
Creatinine, Ser: 1.05 mg/dL — ABNORMAL HIGH (ref 0.44–1.00)
GFR calc Af Amer: 54 mL/min — ABNORMAL LOW (ref >60)
GFR, EST NON AFRICAN AMERICAN: 47 mL/min — AB (ref >60)
Glucose, Bld: 78 mg/dL (ref 65–99)
POTASSIUM: 4.7 mmol/L (ref 3.5–5.1)
Sodium: 134 mmol/L — ABNORMAL LOW (ref 135–145)

## 2017-02-07 LAB — MAGNESIUM: Magnesium: 1.9 mg/dL (ref 1.7–2.4)

## 2017-02-07 LAB — GLUCOSE, CAPILLARY: Glucose-Capillary: 82 mg/dL (ref 65–99)

## 2017-02-07 MED ORDER — IPRATROPIUM-ALBUTEROL 0.5-2.5 (3) MG/3ML IN SOLN: 3.0000 mL | RESPIRATORY_TRACT | Status: DC | PRN

## 2017-02-07 MED ORDER — SODIUM CHLORIDE 0.9 % IV SOLN: Freq: Once | INTRAVENOUS | Status: DC

## 2017-02-07 NOTE — Care Management Important Message (Signed)
Important Message  Patient Details  Name: Sophia [name] MRN: [id number] Date of Birth: [birth year]   Medicare Important Message Given:  Yes    [name] [name] 02/07/2017, 11:00 AM

## 2017-02-07 NOTE — Progress Notes (Signed)
     [facility] Progress Note  Chief Complaint:    anemia  Subjective: Feels okay. Has a dry cough. No overt GI bleeding. No SOB, dizziness  Objective:  Vital signs in last 24 hours: Temp:  [97.5 F (36.4 C)-98.3 F (36.8 C)] 97.5 F (36.4 C) (01/03 0623) Pulse Rate:  [77-88] 80 (01/03 0916) Resp:  [18-24] 18 (01/03 0623) BP: (105-133)/(41-99) 105/41 (01/03 0916) SpO2:  [87 %-100 %] 100 % (01/03 0623) Last BM Date: 02/04/17 General:   Alert, obese white female in NAD EENT:  Normal hearing, non icteric sclera, conjunctive pink.  Heart:  Regular rate and rhythm; +loud murmur, no lower extremity edema Pulm: Normal respiratory effort Abdomen:  Soft, nondistended, nontender.  Normal bowel sounds, no masses felt. No hepatomegaly.    Neurologic:  Alert and  oriented x4;  grossly normal neurologically. Psych:  Pleasant, cooperative.  Normal mood and affect.   Intake/Output from previous day: 01/02 0701 - 01/03 0700 In: 540 [P.O.:240; I.V.:300] Out: 0  Intake/Output this shift: No intake/output data recorded.  Lab Results: Recent Labs    02/05/17 0821 02/06/17 0410 02/07/17 0350  WBC 5.5 5.0 5.1  HGB 9.2* 8.3* 7.9*  HCT 31.0* 27.8* 26.8*  PLT 276 259 245   BMET Recent Labs    02/07/17 0350  NA 134*  K 4.7  CL 106  CO2 22  GLUCOSE 78  BUN 15  CREATININE 1.05*  CALCIUM 8.0*    ASSESSMENT / PLAN:   1. 84 yo female with recurrent anemia / GAVE, s/p 2 units of blood on 12/20. EGD yesterday with findings of 9 cm hiatal hernia and gastric vascular ectasia, s/p ablation. Slight drift in hgb overnight from 8.3 to 7.9 but doesn't feel SOB, weak.  -I made appt for labs in a week and office visit in a few weeks -continue daily ppi -she gets IV iron through Hematology, last infusion was ~ 3 weeks ago. For repeat labs and possible repeat IV iron on 1/7. Will give a unit of blood today prior to discharge   Discharge Planning Diet:regular Anticoagulation  and antiplatelets:N/A Discharge Medications: daily PPI Follow up:  1. CBC in one week at our office (in basement).  2. Follow up with me on 1/31 at 1:30pm   Principal Problem:   GIB (gastrointestinal bleeding) Active Problems:   Iron deficiency anemia due to chronic blood loss   ANXIETY DEPRESSION   Essential hypertension   GASTROESOPHAGEAL REFLUX DISEASE   Aortic stenosis   CKD (chronic kidney disease), stage III (HCC)   Chronic combined systolic (congestive) and diastolic (congestive) heart failure (HCC)   Anemia due to chronic blood loss     LOS: 4 days   Tye Savoy ,NP 02/07/2017, 9:33 AM  Pager number [phone]

## 2017-02-07 NOTE — Care Management Note (Addendum)
Case Management Note Marvetta Gibbons RN, BSN Unit 4E-Case Manager-- 5W coverage 02/07/17 [phone]  Patient Details  Name: Sophia [name] MRN: [id number] Date of Birth: 11/22/1928  Subjective/Objective:  Pt admitted with GIB/anemia-- s/p EGD on 02/06/17                Action/Plan: PTA pt lived at home alone- with assistance from son for meals and home management- Per PT recommendation- CSW following for ST SNF placement--  1200- noted order for home 02- went to speak with pt and son at bedside- pt not really wanting STSNF- [facility] program with [facility] has been offered to pt and son- Tommi Rumps with [facility] is to come meet with them at 1pm- discussed home need for home 02 if pt does return home- will set up with [facility] if needed. CM will f/u after meeting with [facility].  1600- update- spoke with [place] - pt not agreeable to having people come into her house for Home First program- plan will be for pt to go to STSNF- CSW working on placement- CM will not arrange home 02 with plan for SNF.   Expected Discharge Date:                 Expected Discharge Plan:  Skilled Nursing Facility  In-House Referral:  Clinical Social Work  Discharge planning Services  CM Consult  Post Acute Care Choice:    Choice offered to:     DME Arranged:    DME Agency:     HH Arranged:    [place] Agency:     Status of Service:  Completed, signed off  If discussed at [employer], dates discussed:    Discharge Disposition: skilled facility.    Additional Comments:  Dawayne Patricia, RN 02/07/2017, 10:32 AM

## 2017-02-07 NOTE — NC FL2 (Signed)
[place] LEVEL OF CARE SCREENING TOOL     IDENTIFICATION  Patient Name: Sophia [name] Birthdate: 07-Nov-1928 Sex: female Admission Date (Current Location): 02/02/2017  [facility] and [place] Number:  Herbalist and Address:  [facility] [place] [address] [place], [place] [address]      Provider Number: [id number]  Attending Physician Name and Address:  Elmarie Shiley, MD  Relative Name and Phone Number:  [name], son, [phone]    Current Level of Care: Hospital Recommended Level of Care: [place] Prior Approval Number:    Date Approved/Denied:   PASRR Number: [id number]  Discharge Plan: SNF    Current Diagnoses: Patient Active Problem List   Diagnosis Date Noted  . Anemia due to chronic blood loss   . GIB (gastrointestinal bleeding) 02/03/2017  . CKD (chronic kidney disease), stage III ([place]) 02/03/2017  . Chronic combined systolic (congestive) and diastolic (congestive) heart failure ([place]) 02/03/2017  . Orthostatic hypotension 01/12/2016  . Iron deficiency anemia 10/31/2015  . Cataract cortical, senile 07/22/2014  . Absolute anemia   . Leukopenia 09/07/2013  . Deficiency anemia 08/24/2013  . GAVE (gastric antral vascular ectasia) 04/28/2012  . Chronic gastritis 01/09/2011  . Aortic stenosis 01/09/2011  . Epigastric pain 07/29/2010  . HYPERLIPIDEMIA 08/05/2006  . Iron deficiency anemia due to chronic blood loss 08/05/2006  . ANXIETY DEPRESSION 08/05/2006  . NEUROPATHY, IDIOPATHIC PERIPHERAL NEC 08/05/2006  . Essential hypertension 08/05/2006  . GASTROESOPHAGEAL REFLUX DISEASE 08/05/2006  . OSTEOARTHRITIS 08/05/2006  . [place] DISEASE 08/05/2006  . OSTEOPOROSIS 08/05/2006  . SYMPTOM, INCONTINENCE, URINARY NOS 08/05/2006    Orientation RESPIRATION BLADDER Height & Weight     Self, Time, Situation, Place  Normal Continent Weight: 94.7 kg (208 lb 12.4 oz) Height:  5\' 1"  (154.9  cm)  BEHAVIORAL SYMPTOMS/MOOD NEUROLOGICAL BOWEL NUTRITION STATUS      Continent Diet(Please see DC Summary)  AMBULATORY STATUS COMMUNICATION OF NEEDS Skin   Limited Assist Verbally Normal                       Personal Care Assistance Level of Assistance  Bathing, Feeding, Dressing Bathing Assistance: Maximum assistance Feeding assistance: Independent Dressing Assistance: Limited assistance     Functional Limitations Info             SPECIAL CARE FACTORS FREQUENCY  PT (By licensed PT), OT (By licensed OT)     PT Frequency: 5x/week OT Frequency: 3x/week            Contractures      Additional Factors Info  Code Status, Allergies, Psychotropic Code Status Info: Full Allergies Info: Nutritional Supplements, [name], [name] Alendronate Sodium, Lipitor Atorvastatin, Pregabalin, [name], Wasp Venom Psychotropic Info: Zoloft         Current Medications (02/07/2017):  This is the current hospital active medication list Current Facility-Administered Medications  Medication Dose Route Frequency Provider Last Rate Last Dose  . 0.9 %  sodium chloride infusion   Intravenous Once Jerene Bears, MD      . acetaminophen (TYLENOL) tablet 500 mg  500 mg Oral Q6H PRN Ivor Costa, MD   500 mg at 02/07/17 0117  . ALPRAZolam Duanne Moron) tablet 0.25 mg  0.25 mg Oral BID PRN Ivor Costa, MD   0.25 mg at 02/07/17 0929  . calcium-vitamin D (OSCAL WITH D) 500-200 MG-UNIT per tablet 1 tablet  1 tablet Oral Daily Ivor Costa, MD  1 tablet at 02/07/17 0918  . cholecalciferol (VITAMIN D) tablet 2,000 Units  2,000 Units Oral Daily Ivor Costa, MD   2,000 Units at 02/07/17 [phone]  . dextromethorphan-guaiFENesin (MUCINEX DM) 30-600 MG per 12 hr tablet 1 tablet  1 tablet Oral BID PRN Ivor Costa, MD      . EPINEPHrine (EPI-PEN) injection 0.3 mg  0.3 mg Intramuscular Once PRN Ivor Costa, MD      . fluorometholone (FML) 0.1 % ophthalmic suspension 1 drop  1 drop Left Eye Daily Ivor Costa, MD   1 drop at 61/22/44 [phone]  . folic acid (FOLVITE) tablet 1 mg  1,000 mcg Oral Daily Ivor Costa, MD   1 mg at 02/07/17 0919  . gabapentin (NEURONTIN) capsule 800 mg  800 mg Oral BID Ivor Costa, MD   800 mg at 02/07/17 0051  . heparin lock flush 100 unit/mL  500 Units Intracatheter Daily PRN Alvy Bimler, Ni, MD      . heparin lock flush 100 unit/mL  250 Units Intracatheter PRN Alvy Bimler, Ni, MD      . hydrALAZINE (APRESOLINE) injection 5 mg  5 mg Intravenous Q2H PRN Ivor Costa, MD      . ipratropium-albuterol (DUONEB) 0.5-2.5 (3) MG/3ML nebulizer solution 3 mL  3 mL Nebulization Q4H PRN Regalado, Belkys A, MD      . ondansetron (ZOFRAN) tablet 4 mg  4 mg Oral Q6H PRN Ivor Costa, MD       Or  . ondansetron (ZOFRAN) injection 4 mg  4 mg Intravenous Q6H PRN Ivor Costa, MD      . pantoprazole (PROTONIX) EC tablet 40 mg  40 mg Oral Q0600 Vena Rua, PA-C   40 mg at 02/07/17 1021  . polyvinyl alcohol (LIQUIFILM TEARS) 1.4 % ophthalmic solution 1-2 drop  1-2 drop Right Eye PRN Ivor Costa, MD      . sertraline (ZOLOFT) tablet 100 mg  100 mg Oral Daily Ivor Costa, MD   100 mg at 02/04/17 0944  . sodium chloride flush (NS) 0.9 % injection 3 mL  3 mL Intracatheter PRN Alvy Bimler, Ni, MD      . verapamil (CALAN-SR) CR tablet 240 mg  240 mg Oral Daily Ivor Costa, MD   240 mg at 02/07/17 0919  . zolpidem (AMBIEN) tablet 5 mg  5 mg Oral QHS PRN Ivor Costa, MD   5 mg at 02/05/17 2213     Discharge Medications: Please see discharge summary for a list of discharge medications.  Relevant Imaging Results:  Relevant Lab Results:   Additional Information SSN: [place], [place]

## 2017-02-07 NOTE — Clinical Social Work Note (Signed)
Clinical Social Work Assessment  Patient Details  Name: Sophia [name] MRN: [id number] Date of Birth: 1928-06-23  Date of referral:  02/07/17               Reason for consult:  Facility Placement                Permission sought to share information with:  Facility Sport and exercise psychologist, Family Supports Permission granted to share information::  Yes, Verbal Permission Granted  Name::     Facilities manager::  SNFs  Relationship::  Son  Contact Information:  [phone]  Housing/Transportation Living arrangements for the past 2 months:  [place] of Information:  Patient, Adult Children Patient Interpreter Needed:  None Criminal Activity/Legal Involvement Pertinent to Current Situation/Hospitalization:  No - Comment as needed Significant Relationships:  Adult Children Lives with:  Self, Adult Children Do you feel safe going back to the place where you live?  Yes Need for family participation in patient care:  No (Coment)  Care giving concerns:  CSW received consult for possible SNF placement at time of discharge. CSW spoke with patient and her son at bedside regarding PT recommendation of SNF placement at time of discharge. Patient reported that she wants to discharge home and does not want to go to a facility as she has in the past. Patient's son has been caring for patient and has significant caregiver burnout. He would like the patient to be placed in SNF, but is interested in the [place]. CSW to continue to follow and assist with discharge planning needs.   Social Worker assessment / plan:  CSW spoke with patient concerning possibility of rehab at [facility] before returning home.    Employment status:  Retired Forensic scientist:  Information systems manager, Medicaid [place] PT Recommendations:  [place] / Referral to community resources:  [place]  Patient/Family's Response to care:  Since patient is declining SNF, CSW  alerted them that patient is eligible for the Home First program, which would offer around the clock care at home. Outside the room, patient's son shared that it would have to be significant help and that he has been looking into long term care options as well, but patient is unwilling to go. Son reports that patient will only allow him to help her and none of his other siblings. Home First liaison to meet with patient and son today at 1pm. CSW sent out referral to SNFs as well just in case.  Patient/Family's Understanding of and Emotional Response to Diagnosis, Current Treatment, and Prognosis:  Patient/family is realistic regarding therapy needs and expressed being hopeful for return home. Patient expressed understanding of CSW role and discharge process as well as medical condition. No questions/concerns about plan or treatment.    Emotional Assessment Appearance:  Appears stated age Attitude/Demeanor/Rapport:  Engaged Affect (typically observed):  Accepting, Appropriate Orientation:  Oriented to Self, Oriented to Situation, Oriented to Place, Oriented to  Time Alcohol / Substance use:  Not Applicable Psych involvement (Current and /or in the community):  No (Comment)  Discharge Needs  Concerns to be addressed:  Care Coordination Readmission within the last 30 days:  No Current discharge risk:  None Barriers to Discharge:  Continued Medical Work up   [employer], [place] 02/07/2017, 12:25 PM

## 2017-02-07 NOTE — Progress Notes (Signed)
Patient ID: Sophia [name], female   DOB: 1928/09/23, 84 y.o.   MRN: [id number]  PROGRESS NOTE    Sophia [name]  GGY:[id number] DOB: 1928/12/08 DOA: 02/02/2017 PCP: [name], MD   Brief Narrative:  84 year old female with history of hypertension, hyperlipidemia, GERD, depression, anxiety, anemia, diverticulosis, GI bleeding due to gastric antral vascular ectasia, CHF with EF of 40%, chronic kidney disease stage III presented with rectal bleeding.  She was transfused 2 units of PRBC on admission because of hemoglobin of 6.3.  GI was consulted. Assessment & Plan:   Principal Problem:   GIB (gastrointestinal bleeding) Active Problems:   Iron deficiency anemia due to chronic blood loss   ANXIETY DEPRESSION   Essential hypertension   GASTROESOPHAGEAL REFLUX DISEASE   Aortic stenosis   CKD (chronic kidney disease), stage III (HCC)   Chronic combined systolic (congestive) and diastolic (congestive) heart failure (HCC)   Anemia due to chronic blood loss   Acute on chronic blood loss anemia, symptomatic, secondary to chronic intermittent GI bleeding secondary to GAVE  Anemia has been investigated with nurmerous endoscopies, bone marrow biopsy and has had recurrent iron treatment and PRBC transfusion. S/p Feraheme in ED, s/p 2 units PRBC on admission -Status post EGD today which showed GAVE treated with APC. Hb this am at 7.9. Plan to received one unit PRBC> repeat labs in am.   Essential HTN - remains stable.  Continue verapamil  CKD stage III - at baseline.  Repeat a.m. labs  Chronic combined systolic/diastolic CHF. Not on diuretics at home. - appears compensated  -Outpatient follow-up  Aortic stenosis  - Asymptomatic -Outpatient follow-up  Generalized conditioning -PT recommends nursing home placement.  Consult Education officer, museum. Son will speak with in home care also.    DVT prophylaxis: SCDs Code Status: Full Family Communication: None at bedside Disposition Plan:  discharge in 24 hours.   Consultants: GI  Procedures:  EGD on 02/06/2017 Impression:               - Tortuous esophagus.                           - 9 cm hiatal hernia.                           - Gastric antral vascular ectasia. Treated with                            argon plasma coagulation (APC).                           - Normal examined duodenum.                           - No specimens collected.    Antimicrobials: None   Subjective: She is alert, in no distress. Denies abdominal pain.  She decline SNF  Objective: Vitals:   02/07/17 0623 02/07/17 0916 02/07/17 1313 02/07/17 1335  BP: 112/72 (!) 105/41 (!) 102/46 (!) 104/50  Pulse: 77 80    Resp: _0 Temp: (!) 97.5 F (36.4 C)  97.7 F (36.5 C) 98.6 F (37 C)  TempSrc: Oral  Oral Oral  SpO2: 100%  95% 95%  Weight:      Height:  Intake/Output Summary (Last 24 hours) at 02/07/2017 1355 Last data filed at 02/07/2017 1320 Gross per 24 hour  Intake 580 ml  Output 0 ml  Net 580 ml   Filed Weights   02/02/17 2316 02/03/17 1550  Weight: 94.8 kg (209 lb) 94.7 kg (208 lb 12.4 oz)    Examination:  General exam: NAD Respiratory system: CTA Cardiovascular system: S 1, S 2 RRR gastrointestinal system: BS present, soft., nt Extremities: no cyanosis.    Data Reviewed: I have personally reviewed following labs and imaging studies  CBC: Recent Labs  Lab 02/03/17 2134 02/04/17 0857 02/05/17 0821 02/06/17 0410 02/07/17 0350  WBC 4.5 5.6 5.5 5.0 5.1  NEUTROABS  --   --   --   --  3.9  HGB 8.7* 8.6* 9.2* 8.3* 7.9*  HCT 28.7* 28.9* 31.0* 27.8* 26.8*  MCV 92.3 92.3 93.4 93.6 95.0  PLT 259 254 276 259 801   Basic Metabolic Panel: Recent Labs  Lab 02/02/17 2329 02/03/17 1550 02/07/17 0350  NA 140 140 134*  K 5.1 4.6 4.7  CL 110 112* 106  CO2 _0 GLUCOSE 107* 102* 78  BUN 26* 16 15  CREATININE 1.20* 1.05* 1.05*  CALCIUM 8.3* 7.8* 8.0*  MG  --   --  1.9   GFR: Estimated Creatinine  Clearance: 40.4 mL/min (A) (by C-G formula based on SCr of 1.05 mg/dL (H)). Liver Function Tests: Recent Labs  Lab 02/02/17 2330  AST 15  ALT 9*  ALKPHOS 86  BILITOT 0.3  PROT 6.3*  ALBUMIN 3.3*   Recent Labs  Lab 02/02/17 2330  LIPASE 46   No results for input(s): AMMONIA in the last 168 hours. Coagulation Profile: Recent Labs  Lab 02/02/17 2330  INR 0.96   Cardiac Enzymes: No results for input(s): CKTOTAL, CKMB, CKMBINDEX, TROPONINI in the last 168 hours. BNP (last 3 results) No results for input(s): PROBNP in the last 8760 hours. HbA1C: No results for input(s): HGBA1C in the last 72 hours. CBG: Recent Labs  Lab 02/03/17 1200 02/04/17 0747 02/05/17 0817 02/06/17 0750 02/07/17 0757  GLUCAP 96 84 85 84 82   Lipid Profile: No results for input(s): CHOL, HDL, LDLCALC, TRIG, CHOLHDL, LDLDIRECT in the last 72 hours. Thyroid Function Tests: No results for input(s): TSH, T4TOTAL, FREET4, T3FREE, THYROIDAB in the last 72 hours. Anemia Panel: No results for input(s): VITAMINB12, FOLATE, FERRITIN, TIBC, IRON, RETICCTPCT in the last 72 hours. Sepsis Labs: No results for input(s): PROCALCITON, LATICACIDVEN in the last 168 hours.  No results found for this or any previous visit (from the past 240 hour(s)).       Radiology Studies: No results found.      Scheduled Meds: . calcium-vitamin D  1 tablet Oral Daily  . cholecalciferol  2,000 Units Oral Daily  . fluorometholone  1 drop Left Eye Daily  . folic acid  6,553 mcg Oral Daily  . gabapentin  800 mg Oral BID  . pantoprazole  40 mg Oral Q0600  . sertraline  100 mg Oral Daily  . verapamil  240 mg Oral Daily   Continuous Infusions: . sodium chloride       LOS: 4 days        Elmarie Shiley, MD [facility] Pager [phone]  If 7PM-7AM, please contact night-coverage [email or web address] Password [email or web address] 02/07/2017, 1:55 PM

## 2017-02-07 NOTE — Telephone Encounter (Signed)
-----   Message from Willia Craze, NP sent at 02/07/2017 10:42 AM EST ----- Eustaquio Maize, please put order for a CBC in one week. Thanks

## 2017-02-07 NOTE — Clinical Social Work Note (Signed)
Clinical Social Work Assessment  Patient Details  Name: Sophia [name] MRN: [id number] Date of Birth: 09-27-1928  Date of referral:  02/07/17               Reason for consult:  Facility Placement                Permission sought to share information with:  Facility Sport and exercise psychologist, Family Supports Permission granted to share information::  Yes, Verbal Permission Granted  Name::     Facilities manager::  SNFs  Relationship::  Son  Contact Information:  [phone]  Housing/Transportation Living arrangements for the past 2 months:  [place] of Information:  Patient, Adult Children Patient Interpreter Needed:  None Criminal Activity/Legal Involvement Pertinent to Current Situation/Hospitalization:  No - Comment as needed Significant Relationships:  Adult Children Lives with:  Self, Adult Children Do you feel safe going back to the place where you live?  Yes Need for family participation in patient care:  No (Coment)  Care giving concerns:  CSW received consult for possible SNF placement at time of discharge. CSW spoke with patient and her son regarding PT recommendation of SNF placement at time of discharge. Patient's son reported that he is currently unable to care for patient at their home given patient's current physical needs and fall risk. Patient expressed understanding of PT recommendation and is reluctantly agreeable to SNF placement at time of discharge. Patient was assessed for [facility] Program but they do not feel she would be successful since she is reluctant to let anyone into her home. CSW to continue to follow and assist with discharge planning needs.   Social Worker assessment / plan:  CSW spoke with patient and her son concerning possibility of rehab at [facility] before returning home.  Employment status:  Retired Forensic scientist:  Information systems manager, Medicaid In [place] PT Recommendations:  [place] / Referral to community  resources:  [place]  Patient/Family's Response to care:  Patient recognizes need for rehab before returning home and is agreeable to a SNF in [place]. She would rather return home, but patient's son is experiencing caregiver burnout. Patient's son reported preference for [facility] or [facility]. Patient will need PTAR for transport.  Patient/Family's Understanding of and Emotional Response to Diagnosis, Current Treatment, and Prognosis:  Patient/family is realistic regarding therapy needs and expressed being hopeful for eventual return home. Patient expressed understanding of CSW role and discharge process as well as medical condition. No questions/concerns about plan or treatment.    Emotional Assessment Appearance:  Appears stated age Attitude/Demeanor/Rapport:  Engaged Affect (typically observed):  Accepting, Appropriate Orientation:  Oriented to Self, Oriented to Situation, Oriented to Place, Oriented to  Time Alcohol / Substance use:  Not Applicable Psych involvement (Current and /or in the community):  No (Comment)  Discharge Needs  Concerns to be addressed:  Care Coordination Readmission within the last 30 days:  No Current discharge risk:  None Barriers to Discharge:  Continued Medical Work up   [employer], [place] 02/07/2017, 3:33 PM

## 2017-02-08 DIAGNOSIS — R488 Other symbolic dysfunctions: Secondary | ICD-10-CM | POA: Diagnosis not present

## 2017-02-08 DIAGNOSIS — Z9981 Dependence on supplemental oxygen: Secondary | ICD-10-CM | POA: Diagnosis not present

## 2017-02-08 DIAGNOSIS — K31811 Angiodysplasia of stomach and duodenum with bleeding: Secondary | ICD-10-CM | POA: Diagnosis not present

## 2017-02-08 DIAGNOSIS — R2681 Unsteadiness on feet: Secondary | ICD-10-CM | POA: Diagnosis not present

## 2017-02-08 DIAGNOSIS — I1310 Hypertensive heart and chronic kidney disease without heart failure, with stage 1 through stage 4 chronic kidney disease, or unspecified chronic kidney disease: Secondary | ICD-10-CM | POA: Diagnosis not present

## 2017-02-08 DIAGNOSIS — I5042 Chronic combined systolic (congestive) and diastolic (congestive) heart failure: Secondary | ICD-10-CM | POA: Diagnosis not present

## 2017-02-08 DIAGNOSIS — K2961 Other gastritis with bleeding: Secondary | ICD-10-CM | POA: Diagnosis not present

## 2017-02-08 DIAGNOSIS — K2971 Gastritis, unspecified, with bleeding: Secondary | ICD-10-CM | POA: Diagnosis not present

## 2017-02-08 DIAGNOSIS — N183 Chronic kidney disease, stage 3 (moderate): Secondary | ICD-10-CM | POA: Diagnosis not present

## 2017-02-08 DIAGNOSIS — I509 Heart failure, unspecified: Secondary | ICD-10-CM | POA: Diagnosis not present

## 2017-02-08 DIAGNOSIS — N189 Chronic kidney disease, unspecified: Secondary | ICD-10-CM | POA: Diagnosis not present

## 2017-02-08 DIAGNOSIS — R278 Other lack of coordination: Secondary | ICD-10-CM | POA: Diagnosis not present

## 2017-02-08 DIAGNOSIS — D649 Anemia, unspecified: Secondary | ICD-10-CM | POA: Diagnosis not present

## 2017-02-08 DIAGNOSIS — R100 Acute abdomen: Secondary | ICD-10-CM | POA: Diagnosis not present

## 2017-02-08 DIAGNOSIS — M6281 Muscle weakness (generalized): Secondary | ICD-10-CM | POA: Diagnosis not present

## 2017-02-08 DIAGNOSIS — I350 Nonrheumatic aortic (valve) stenosis: Secondary | ICD-10-CM | POA: Diagnosis not present

## 2017-02-08 DIAGNOSIS — K219 Gastro-esophageal reflux disease without esophagitis: Secondary | ICD-10-CM | POA: Diagnosis not present

## 2017-02-08 DIAGNOSIS — K922 Gastrointestinal hemorrhage, unspecified: Secondary | ICD-10-CM | POA: Diagnosis not present

## 2017-02-08 DIAGNOSIS — Z5189 Encounter for other specified aftercare: Secondary | ICD-10-CM | POA: Diagnosis not present

## 2017-02-08 DIAGNOSIS — D500 Iron deficiency anemia secondary to blood loss (chronic): Secondary | ICD-10-CM | POA: Diagnosis not present

## 2017-02-08 DIAGNOSIS — K31819 Angiodysplasia of stomach and duodenum without bleeding: Secondary | ICD-10-CM | POA: Diagnosis not present

## 2017-02-08 LAB — CBC
HCT: 30.8 % — ABNORMAL LOW (ref 36.0–46.0)
HEMOGLOBIN: 9 g/dL — AB (ref 12.0–15.0)
MCH: 26.9 pg (ref 26.0–34.0)
MCHC: 29.2 g/dL — ABNORMAL LOW (ref 30.0–36.0)
MCV: 91.9 fL (ref 78.0–100.0)
PLATELETS: 232 10*3/uL (ref 150–400)
RBC: 3.35 MIL/uL — AB (ref 3.87–5.11)
RDW: 18.5 % — ABNORMAL HIGH (ref 11.5–15.5)
WBC: 5.5 10*3/uL (ref 4.0–10.5)

## 2017-02-08 LAB — TYPE AND SCREEN
ABO/RH(D): O POS
ANTIBODY SCREEN: NEGATIVE
Unit division: 0

## 2017-02-08 LAB — BPAM RBC
Blood Product Expiration Date: 201901142359
ISSUE DATE / TIME: 201901031301
UNIT TYPE AND RH: 9500

## 2017-02-08 LAB — GLUCOSE, CAPILLARY: GLUCOSE-CAPILLARY: 85 mg/dL (ref 65–99)

## 2017-02-08 MED ORDER — GABAPENTIN 400 MG PO CAPS: 800.0000 mg | ORAL_CAPSULE | Freq: Two times a day (BID) | ORAL | 0 refills | Status: DC

## 2017-02-08 MED ORDER — ALPRAZOLAM 0.25 MG PO TABS: 0.2500 mg | ORAL_TABLET | Freq: Two times a day (BID) | ORAL | 0 refills | Status: DC | PRN

## 2017-02-08 NOTE — Progress Notes (Signed)
Physical Therapy Treatment Patient Details Name: Sophia [name] MRN: [id number] DOB: 09-Feb-1928 Today's Date: 02/08/2017    History of Present Illness 70yow PMH chronic GIB, chronic blood loss anemia secondary to GAVE (Gastric antral vascular ectasia) presented with weakness and mild SOB. Admitted for PRBC transfusion. Hgb stable, no bleeding; GI rec EGD 1/2. Can likely go home 1/2.    PT Comments    NT reported just transferring pt to chair prior to PTAs arrival. Today's skilled session focused on ther ex for LE strengthening. Pt became fatigued with marching in standing, requiring seated rest break. SpO2 at 93% of 2 L O2. Pt would benefit from continued skilled PT to increase activity tolerance and functional independence. Will continue to follow acutely.   Follow Up Recommendations  SNF     Equipment Recommendations  Wheelchair (measurements PT);Wheelchair cushion (measurements PT)(worth considering hospital bed; likely need for supp O2)    Recommendations for Other Services OT consult(ordered per protocol; [facility] liaison)     Precautions / Restrictions Precautions Precautions: Fall Restrictions Weight Bearing Restrictions: No    Mobility  Bed Mobility               General bed mobility comments: in chair on arrival  Transfers Overall transfer level: Needs assistance Equipment used: Rolling walker (2 wheeled) Transfers: Sit to/from Stand Sit to Stand: Min guard         General transfer comment: Pt able to stand with min guard for safety and cueing for technique. Increased time and use of arm rests required.  Ambulation/Gait                 Stairs            Wheelchair Mobility    Modified Rankin (Stroke Patients Only)       Balance Overall balance assessment: Needs assistance   Sitting balance-Leahy Scale: Poor     Standing balance support: Bilateral upper extremity supported Standing balance-Leahy Scale: Poor Standing balance  comment: reliant on BIL UE support                            Cognition Arousal/Alertness: Awake/alert Behavior During Therapy: WFL for tasks assessed/performed Overall Cognitive Status: Within Functional Limits for tasks assessed                                        Exercises Total Joint Exercises Ankle Circles/Pumps: AROM;Both;10 reps;Seated Heel Slides: AROM;Both;10 reps;Seated Hip ABduction/ADduction: AROM;Both;10 reps;Seated Long Arc Quad: AROM;Both;10 reps;Seated Marching in Standing: AROM;Both;10 reps;Seated    General Comments        Pertinent Vitals/Pain Pain Assessment: No/denies pain    Home Living                      Prior Function            PT Goals (current goals can now be found in the care plan section) Acute Rehab PT Goals Patient Stated Goal: to go home PT Goal Formulation: With patient Time For Goal Achievement: 02/20/17 Potential to Achieve Goals: Good    Frequency    Min 3X/week      PT Plan Current plan remains appropriate    Co-evaluation              AM-PAC PT "6 Clicks" Daily Activity  Outcome Measure  Difficulty turning over in bed (including adjusting bedclothes, sheets and blankets)?: Unable Difficulty moving from lying on back to sitting on the side of the bed? : Unable Difficulty sitting down on and standing up from a chair with arms (e.g., wheelchair, bedside commode, etc,.)?: Unable Help needed moving to and from a bed to chair (including a wheelchair)?: A Little Help needed walking in hospital room?: A Lot Help needed climbing 3-5 steps with a railing? : Total 6 Click Score: 9    End of Session Equipment Utilized During Treatment: Gait belt;Oxygen Activity Tolerance: Patient tolerated treatment well Patient left: in chair;with call bell/phone within reach Nurse Communication: Mobility status PT Visit Diagnosis: Other abnormalities of gait and mobility (R26.89);Muscle  weakness (generalized) (M62.81);Other (comment)(decr functional capacity)     Time: 8841-6606 PT Time Calculation (min) (ACUTE ONLY): 21 min  Charges:  $Therapeutic Exercise: 8-22 mins                    G Codes:       Benjiman Core, [place] Pager [phone] Acute Rehab   Allena Katz 02/08/2017, 11:07 AM

## 2017-02-08 NOTE — Discharge Summary (Signed)
Physician Discharge Summary  Sophia [name] QHU:[id number] DOB: May 28, 1928 DOA: 02/02/2017  PCP: [name], MD  Admit date: 02/02/2017 Discharge date: 02/08/2017  Admitted From: Home  Disposition: SNF  Recommendations for Outpatient Follow-up:  1. Follow up with PCP in 1-2 weeks 2. Please obtain BMP/CBC in one week 3. Needs to follow up with Dr Alvy Bimler, she will need iron level and ferritin level check and IV iron as needed.     Discharge Condition; stable.  CODE STATUS: Full code.  Diet recommendation: Heart Healthy  Brief/Interim Summary: Brief Narrative:  84 year old female with history of hypertension, hyperlipidemia, GERD, depression, anxiety, anemia, diverticulosis, GI bleeding due to gastric antral vascular ectasia, CHF with EF of 40%, chronic kidney disease stage III presented with rectal bleeding.  She was transfused 2 units of PRBC on admission because of hemoglobin of 6.3.  GI was consulted. Assessment & Plan:   Principal Problem:   GIB (gastrointestinal bleeding) Active Problems:   Iron deficiency anemia due to chronic blood loss   ANXIETY DEPRESSION   Essential hypertension   GASTROESOPHAGEAL REFLUX DISEASE   Aortic stenosis   CKD (chronic kidney disease), stage III (HCC)   Chronic combined systolic (congestive) and diastolic (congestive) heart failure (HCC)   Anemia due to chronic blood loss   Acute on chronic blood loss anemia, symptomatic,secondary to chronic intermittent GI bleeding secondary to GAVE  Anemia has been investigated with nurmerous endoscopies, bone marrow biopsy and has had recurrent iron treatment and PRBC transfusion. S/p Feraheme in ED,s/p 2 units PRBC on admission -Status post EGD today which showed GAVE treated with APC. Hb was at 7, on 1-03. She received another unit PRBC on 02-07-2017. Hb today post transfusion is at 9. She will need to follow up with her hematologist for iron and ferritin level and possible IV iron. She also has  an appointment with GI.    Essential HTN - remains stable.  Continue verapamil  CKD stage III - at baseline.   Chronic combined systolic/diastolic CHF. Not on diuretics at home. - appears compensated -Outpatient follow-up  Aortic stenosis  - Asymptomatic -Outpatient follow-up  Generalized conditioning -PT recommends nursing home placement.  Consult Education officer, museum. Plan to discharge to SNF>      Discharge Diagnoses:  Principal Problem:   GIB (gastrointestinal bleeding) Active Problems:   Iron deficiency anemia due to chronic blood loss   ANXIETY DEPRESSION   Essential hypertension   GASTROESOPHAGEAL REFLUX DISEASE   Aortic stenosis   CKD (chronic kidney disease), stage III (HCC)   Chronic combined systolic (congestive) and diastolic (congestive) heart failure (HCC)   Anemia due to chronic blood loss    Discharge Instructions  Discharge Instructions    Diet - low sodium heart healthy   Complete by:  As directed    Increase activity slowly   Complete by:  As directed      Allergies as of 02/08/2017      Reactions   Nutritional Supplements Anaphylaxis   [name] Shortness Of Breath   [name] [alendronate Sodium] Other (See Comments)   Reaction:  GI upset    Lipitor [atorvastatin] Other (See Comments)   Reaction:  Muscle aches    Pregabalin Nausea And Vomiting   [name] Nausea And Vomiting   Wasp Venom Hives      Medication List    STOP taking these medications   benzonatate 100 MG capsule Commonly known as:  TESSALON   ferumoxytol 510 MG/17ML Soln injection Commonly known as:  FERAHEME     TAKE these medications   acetaminophen 500 MG tablet Commonly known as:  TYLENOL Take 500 mg by mouth 2 (two) times daily as needed for mild pain or fever.   albuterol 108 (90 Base) MCG/ACT inhaler Commonly known as:  PROVENTIL HFA;VENTOLIN HFA Inhale 1-2 puffs into the lungs every 6 (six) hours as needed for wheezing or  shortness of breath.   ALPRAZolam 0.25 MG tablet Commonly known as:  XANAX Take 1 tablet (0.25 mg total) by mouth 2 (two) times daily as needed for anxiety.   calcium-vitamin D 500-200 MG-UNIT tablet Take by mouth.   cholecalciferol 1000 units tablet Commonly known as:  VITAMIN D Take 2,000 Units by mouth daily.   DEXILANT 60 MG capsule Generic drug:  dexlansoprazole TAKE 1 TABLET BY MOUTH DAILY EVERY MORNING.   EPIPEN 2-PAK 0.3 mg/0.3 mL Soaj injection Generic drug:  EPINEPHrine Inject 0.3 mg into the muscle once as needed (for severe allergic reaction).   fluorometholone 0.1 % ophthalmic suspension Commonly known as:  FML Place 1 drop into the left eye daily.   folic acid 950 MCG tablet Commonly known as:  FOLVITE Take 800 mcg by mouth daily.   gabapentin 400 MG capsule Commonly known as:  NEURONTIN Take 2 capsules (800 mg total) by mouth 2 (two) times daily.   hydroxypropyl methylcellulose / hypromellose 2.5 % ophthalmic solution Commonly known as:  ISOPTO TEARS / GONIOVISC Place 1-2 drops into the right eye as needed for dry eyes.   PROLIA 60 MG/ML Soln injection Generic drug:  denosumab Inject 60 mg into the skin every 6 (six) months.   sertraline 100 MG tablet Commonly known as:  ZOLOFT Take 100 mg by mouth daily.   verapamil 240 MG CR tablet Commonly known as:  CALAN-SR Take 240 mg by mouth daily.            Durable Medical Equipment  (From admission, onward)        Start     Ordered   02/07/17 1041  For home use only DME oxygen  Once    Question Answer Comment  Mode or (Route) Nasal cannula   Liters per Minute 2   Oxygen delivery system Gas      02/07/17 1040      Contact information for follow-up providers    Willia Craze, NP Follow up on 03/07/2017.   Specialty:  Gastroenterology Why:  at 1:30 pm Contact information: [place] [address] [phone]        [name], MD Follow up.   Specialty:   Hematology and Oncology Why:  please keep appointment on Monday  Contact information: [place] [phone]            Contact information for after-discharge care    Destination    [facility] Follow up.   Service:  Skilled Nursing Contact information: [address] [place] [phone]                 Allergies  Allergen Reactions  . Nutritional Supplements Anaphylaxis  . [name] Shortness Of Breath  . [name] [Alendronate Sodium] Other (See Comments)    Reaction:  GI upset   . Lipitor [Atorvastatin] Other (See Comments)    Reaction:  Muscle aches   . Pregabalin Nausea And Vomiting  . [name] Nausea And Vomiting  . Wasp Venom Hives    Consultations:  GI   Procedures/Studies: Dg Chest 2 View  Result Date: 02/02/2017 CLINICAL DATA:  84 year old female with cough. EXAM: CHEST  2 VIEW COMPARISON:  Chest radiograph dated 01/16/2017 FINDINGS: Diffuse interstitial and peribronchial prominence, likely chronic. Mild edema is not excluded. Clinical correlation is recommended. No focal consolidation, pleural effusion, or pneumothorax. There is a small hiatal hernia. No acute osseous pathology. IMPRESSION: Probable chronic bronchitic changes. Mild interstitial edema is not entirely excluded. Clinical correlation is recommended. Electronically Signed   By: Anner Crete M.D.   On: 02/02/2017 23:56   Dg Chest 2 View  Result Date: 01/16/2017 CLINICAL DATA:  Four days of right axillary rib pain with no known injury. History of CHF, peripheral thrombosis. Former smoker. EXAM: CHEST  2 VIEW COMPARISON:  PA and lateral chest x-ray of January 10, 2011 FINDINGS: The lungs are reasonably well inflated. There is no focal infiltrate. The cardiac silhouette is top-normal in size. The pulmonary vascularity is not clearly engorged. There is a hiatal hernia. There is no  pleural effusion. There are compression fractures of T7 and T11 with loss of height anteriorly of approximately 40-50%. These compressions appear new. IMPRESSION: Chronic bronchitic changes, stable. Mild cardiomegaly without definite pulmonary edema. New compression fractures of T7 and T11. The observed ribs are unremarkable. Electronically Signed   By: David  Martinique M.D.   On: 01/16/2017 12:52     Subjective: Feeling well, her bowel were rumbling.  Denies abdominal pain   Discharge Exam: Vitals:   02/08/17 0012 02/08/17 0543  BP: (!) 123/51 (!) 127/57  Pulse: 65 80  Resp:  19  Temp:  (!) 97.5 F (36.4 C)  SpO2:  98%   Vitals:   02/07/17 1335 02/07/17 2153 02/08/17 0012 02/08/17 0543  BP: (!) 104/50 (!) 82/60 (!) 123/51 (!) 127/57  Pulse:  87 65 80  Resp: 18 18  19   Temp: 98.6 F (37 C) 98.1 F (36.7 C)  (!) 97.5 F (36.4 C)  TempSrc: Oral   Oral  SpO2: 95% 97%  98%  Weight:      Height:        General: Pt is alert, awake, not in acute distress Cardiovascular: RRR, S1/S2 +, no rubs, no gallops Respiratory: CTA bilaterally, no wheezing, no rhonchi Abdominal: Soft, NT, ND, bowel sounds + Extremities: no edema, no cyanosis    The results of significant diagnostics from this hospitalization (including imaging, microbiology, ancillary and laboratory) are listed below for reference.     Microbiology: No results found for this or any previous visit (from the past 240 hour(s)).   Labs: BNP (last 3 results) Recent Labs    01/16/17 1505 02/02/17 2330  BNP 320.0* 786.7*   Basic Metabolic Panel: Recent Labs  Lab 02/02/17 2329 02/03/17 1550 02/07/17 0350  NA 140 140 134*  K 5.1 4.6 4.7  CL 110 112* 106  CO2 24 24 22   GLUCOSE 107* 102* 78  BUN 26* 16 15  CREATININE 1.20* 1.05* 1.05*  CALCIUM 8.3* 7.8* 8.0*  MG  --   --  1.9   Liver Function Tests: Recent Labs  Lab 02/02/17 2330  AST 15  ALT 9*  ALKPHOS 86  BILITOT 0.3  PROT 6.3*  ALBUMIN 3.3*    Recent Labs  Lab 02/02/17 2330  LIPASE 46   No results for input(s): AMMONIA in the last 168 hours. CBC: Recent Labs  Lab 02/04/17 0857 02/05/17 0821 02/06/17 0410 02/07/17 0350 02/08/17 0418  WBC 5.6 5.5 5.0 5.1  5.5  NEUTROABS  --   --   --  3.9  --   HGB 8.6* 9.2* 8.3* 7.9* 9.0*  HCT 28.9* 31.0* 27.8* 26.8* 30.8*  MCV 92.3 93.4 93.6 95.0 91.9  PLT 254 276 259 245 232   Cardiac Enzymes: No results for input(s): CKTOTAL, CKMB, CKMBINDEX, TROPONINI in the last 168 hours. BNP: Invalid input(s): POCBNP CBG: Recent Labs  Lab 02/04/17 0747 02/05/17 0817 02/06/17 0750 02/07/17 0757 02/08/17 0753  GLUCAP 84 85 84 82 85   D-Dimer No results for input(s): DDIMER in the last 72 hours. Hgb A1c No results for input(s): HGBA1C in the last 72 hours. Lipid Profile No results for input(s): CHOL, HDL, LDLCALC, TRIG, CHOLHDL, LDLDIRECT in the last 72 hours. Thyroid function studies No results for input(s): TSH, T4TOTAL, T3FREE, THYROIDAB in the last 72 hours.  Invalid input(s): FREET3 Anemia work up No results for input(s): VITAMINB12, FOLATE, FERRITIN, TIBC, IRON, RETICCTPCT in the last 72 hours. Urinalysis    Component Value Date/Time   COLORURINE STRAW (A) 02/02/2017 2329   APPEARANCEUR CLEAR 02/02/2017 2329   LABSPEC 1.012 02/02/2017 2329   PHURINE 6.0 02/02/2017 2329   GLUCOSEU NEGATIVE 02/02/2017 2329   HGBUR NEGATIVE 02/02/2017 2329   BILIRUBINUR NEGATIVE 02/02/2017 2329   KETONESUR NEGATIVE 02/02/2017 2329   PROTEINUR NEGATIVE 02/02/2017 2329   UROBILINOGEN 0.2 08/27/2014 1925   NITRITE NEGATIVE 02/02/2017 2329   LEUKOCYTESUR NEGATIVE 02/02/2017 2329   Sepsis Labs Invalid input(s): PROCALCITONIN,  WBC,  LACTICIDVEN Microbiology No results found for this or any previous visit (from the past 240 hour(s)).   Time coordinating discharge: Over 30 minutes  SIGNED:   Elmarie Shiley, MD  [facility] 02/08/2017, 10:46 AM Pager [phone]  If  7PM-7AM, please contact night-coverage [email or web address] Password [email or web address]

## 2017-02-08 NOTE — Clinical Social Work Placement (Signed)
   CLINICAL SOCIAL WORK PLACEMENT  NOTE  Date:  02/08/2017  Patient Details  Name: Sophia [name] MRN: [id number] Date of Birth: 07/15/1928  Clinical Social Work is seeking post-discharge placement for this patient at the [place] level of care (*CSW will initial, date and re-position this form in  chart as items are completed):  Yes   Patient/family provided with [place] Work Department's list of facilities offering this level of care within the geographic area requested by the patient (or if unable, by the patient's family).  Yes   Patient/family informed of their freedom to choose among providers that offer the needed level of care, that participate in Medicare, Medicaid or managed care program needed by the patient, have an available bed and are willing to accept the patient.  Yes   Patient/family informed of [facility]'s ownership interest in [facility] and [facility], as well as of the fact that they are under no obligation to receive care at these facilities.  PASRR submitted to EDS on       PASRR number received on       Existing PASRR number confirmed on 02/07/17     FL2 transmitted to all facilities in geographic area requested by pt/family on 02/07/17     FL2 transmitted to all facilities within larger geographic area on       Patient informed that his/her managed care company has contracts with or will negotiate with certain facilities, including the following:        Yes   Patient/family informed of bed offers received.  Patient chooses bed at [facility]     Physician recommends and patient chooses bed at      Patient to be transferred to [facility] on 02/08/17.  Patient to be transferred to facility by [facility]     Patient family notified on 02/08/17 of transfer.  Name of family member notified:  Son     PHYSICIAN Please prepare priority discharge summary, including medications     Additional Comment:     _______________________________________________ Benard Halsted, [place] 02/08/2017, 10:10 AM

## 2017-02-08 NOTE — Progress Notes (Signed)
Discharge teaching given and reviewed with patient and son, including activity, diet, follow-up appoints, and medications. Patient and son verbalized understanding of all discharge instructions. IV access was d/c'd. Vitals are stable. Skin is intact except as charted in most recent assessments. Patient requested to stay for lunch because she would miss lunch at [facility].  Pt to be escorted out by [facility], to be transferred to [facility].

## 2017-02-08 NOTE — Progress Notes (Signed)
Patient will DC to: [name] Anticipated DC date: 02/08/17 Family notified: Son Transport by: Corey Harold 1:45pm   Per MD patient ready for DC to [facility]. RN, patient, patient's family, and facility notified of DC. Discharge Summary sent to facility. RN given number for report [phone] Room 704). DC packet on chart. Ambulance transport requested for patient.   CSW signing off.  Cedric Fishman, [place] Social Worker [phone]

## 2017-02-09 ENCOUNTER — Encounter (HOSPITAL_COMMUNITY): Payer: Self-pay | Admitting: Internal Medicine

## 2017-02-11 ENCOUNTER — Ambulatory Visit: Payer: Medicare Other

## 2017-02-11 ENCOUNTER — Other Ambulatory Visit: Payer: Medicare Other

## 2017-02-11 DIAGNOSIS — D649 Anemia, unspecified: Secondary | ICD-10-CM | POA: Diagnosis not present

## 2017-02-11 DIAGNOSIS — K31819 Angiodysplasia of stomach and duodenum without bleeding: Secondary | ICD-10-CM | POA: Diagnosis not present

## 2017-02-11 DIAGNOSIS — N189 Chronic kidney disease, unspecified: Secondary | ICD-10-CM | POA: Diagnosis not present

## 2017-02-11 DIAGNOSIS — I509 Heart failure, unspecified: Secondary | ICD-10-CM | POA: Diagnosis not present

## 2017-02-13 ENCOUNTER — Ambulatory Visit: Payer: Medicare Other | Admitting: Physician Assistant

## 2017-02-15 ENCOUNTER — Telehealth: Payer: Self-pay | Admitting: *Deleted

## 2017-02-15 NOTE — Telephone Encounter (Signed)
Son states Hgb today at nursing home is 9.7.  Dr Alvy Bimler states we can cancel lab appt on Monday, do iron only

## 2017-02-18 ENCOUNTER — Other Ambulatory Visit: Payer: Medicare Other

## 2017-02-18 ENCOUNTER — Inpatient Hospital Stay: Payer: No Typology Code available for payment source | Attending: Hematology and Oncology

## 2017-02-18 VITALS — BP 119/74 | HR 85 | Temp 98.7°F | Resp 18

## 2017-02-18 DIAGNOSIS — K31811 Angiodysplasia of stomach and duodenum with bleeding: Secondary | ICD-10-CM | POA: Diagnosis not present

## 2017-02-18 DIAGNOSIS — D500 Iron deficiency anemia secondary to blood loss (chronic): Secondary | ICD-10-CM | POA: Diagnosis not present

## 2017-02-18 DIAGNOSIS — D508 Other iron deficiency anemias: Secondary | ICD-10-CM

## 2017-02-18 DIAGNOSIS — D539 Nutritional anemia, unspecified: Secondary | ICD-10-CM

## 2017-02-18 DIAGNOSIS — K31819 Angiodysplasia of stomach and duodenum without bleeding: Secondary | ICD-10-CM

## 2017-02-18 MED ORDER — SODIUM CHLORIDE 0.9 % IV SOLN
510.0000 mg | Freq: Once | INTRAVENOUS | Status: AC
  Administered 2017-02-18: 510 mg via INTRAVENOUS
  Filled 2017-02-18: qty 17

## 2017-02-18 NOTE — Patient Instructions (Signed)

## 2017-02-27 DIAGNOSIS — D649 Anemia, unspecified: Secondary | ICD-10-CM | POA: Diagnosis not present

## 2017-02-27 DIAGNOSIS — I509 Heart failure, unspecified: Secondary | ICD-10-CM | POA: Diagnosis not present

## 2017-03-01 DIAGNOSIS — I1310 Hypertensive heart and chronic kidney disease without heart failure, with stage 1 through stage 4 chronic kidney disease, or unspecified chronic kidney disease: Secondary | ICD-10-CM | POA: Diagnosis not present

## 2017-03-01 DIAGNOSIS — I350 Nonrheumatic aortic (valve) stenosis: Secondary | ICD-10-CM | POA: Diagnosis not present

## 2017-03-01 DIAGNOSIS — D500 Iron deficiency anemia secondary to blood loss (chronic): Secondary | ICD-10-CM | POA: Diagnosis not present

## 2017-03-01 DIAGNOSIS — I5042 Chronic combined systolic (congestive) and diastolic (congestive) heart failure: Secondary | ICD-10-CM | POA: Diagnosis not present

## 2017-03-01 DIAGNOSIS — K2971 Gastritis, unspecified, with bleeding: Secondary | ICD-10-CM | POA: Diagnosis not present

## 2017-03-01 DIAGNOSIS — N183 Chronic kidney disease, stage 3 (moderate): Secondary | ICD-10-CM | POA: Diagnosis not present

## 2017-03-04 DIAGNOSIS — I1310 Hypertensive heart and chronic kidney disease without heart failure, with stage 1 through stage 4 chronic kidney disease, or unspecified chronic kidney disease: Secondary | ICD-10-CM | POA: Diagnosis not present

## 2017-03-04 DIAGNOSIS — N183 Chronic kidney disease, stage 3 (moderate): Secondary | ICD-10-CM | POA: Diagnosis not present

## 2017-03-04 DIAGNOSIS — I5042 Chronic combined systolic (congestive) and diastolic (congestive) heart failure: Secondary | ICD-10-CM | POA: Diagnosis not present

## 2017-03-04 DIAGNOSIS — K2971 Gastritis, unspecified, with bleeding: Secondary | ICD-10-CM | POA: Diagnosis not present

## 2017-03-04 DIAGNOSIS — I350 Nonrheumatic aortic (valve) stenosis: Secondary | ICD-10-CM | POA: Diagnosis not present

## 2017-03-04 DIAGNOSIS — D500 Iron deficiency anemia secondary to blood loss (chronic): Secondary | ICD-10-CM | POA: Diagnosis not present

## 2017-03-07 ENCOUNTER — Ambulatory Visit: Payer: Medicare Other | Admitting: Nurse Practitioner

## 2017-03-07 DIAGNOSIS — I5042 Chronic combined systolic (congestive) and diastolic (congestive) heart failure: Secondary | ICD-10-CM | POA: Diagnosis not present

## 2017-03-07 DIAGNOSIS — K2971 Gastritis, unspecified, with bleeding: Secondary | ICD-10-CM | POA: Diagnosis not present

## 2017-03-07 DIAGNOSIS — N183 Chronic kidney disease, stage 3 (moderate): Secondary | ICD-10-CM | POA: Diagnosis not present

## 2017-03-07 DIAGNOSIS — D500 Iron deficiency anemia secondary to blood loss (chronic): Secondary | ICD-10-CM | POA: Diagnosis not present

## 2017-03-07 DIAGNOSIS — I350 Nonrheumatic aortic (valve) stenosis: Secondary | ICD-10-CM | POA: Diagnosis not present

## 2017-03-07 DIAGNOSIS — I1310 Hypertensive heart and chronic kidney disease without heart failure, with stage 1 through stage 4 chronic kidney disease, or unspecified chronic kidney disease: Secondary | ICD-10-CM | POA: Diagnosis not present

## 2017-03-08 DIAGNOSIS — D500 Iron deficiency anemia secondary to blood loss (chronic): Secondary | ICD-10-CM | POA: Diagnosis not present

## 2017-03-08 DIAGNOSIS — K2971 Gastritis, unspecified, with bleeding: Secondary | ICD-10-CM | POA: Diagnosis not present

## 2017-03-08 DIAGNOSIS — I1310 Hypertensive heart and chronic kidney disease without heart failure, with stage 1 through stage 4 chronic kidney disease, or unspecified chronic kidney disease: Secondary | ICD-10-CM | POA: Diagnosis not present

## 2017-03-08 DIAGNOSIS — I5042 Chronic combined systolic (congestive) and diastolic (congestive) heart failure: Secondary | ICD-10-CM | POA: Diagnosis not present

## 2017-03-08 DIAGNOSIS — I350 Nonrheumatic aortic (valve) stenosis: Secondary | ICD-10-CM | POA: Diagnosis not present

## 2017-03-08 DIAGNOSIS — N183 Chronic kidney disease, stage 3 (moderate): Secondary | ICD-10-CM | POA: Diagnosis not present

## 2017-03-11 ENCOUNTER — Inpatient Hospital Stay: Payer: Medicare Other

## 2017-03-11 ENCOUNTER — Inpatient Hospital Stay: Payer: Medicare Other | Attending: Hematology and Oncology

## 2017-03-11 VITALS — BP 122/67 | HR 81 | Temp 98.0°F | Resp 18

## 2017-03-11 DIAGNOSIS — K31811 Angiodysplasia of stomach and duodenum with bleeding: Secondary | ICD-10-CM | POA: Insufficient documentation

## 2017-03-11 DIAGNOSIS — D539 Nutritional anemia, unspecified: Secondary | ICD-10-CM

## 2017-03-11 DIAGNOSIS — K31819 Angiodysplasia of stomach and duodenum without bleeding: Secondary | ICD-10-CM

## 2017-03-11 DIAGNOSIS — D500 Iron deficiency anemia secondary to blood loss (chronic): Secondary | ICD-10-CM | POA: Diagnosis not present

## 2017-03-11 DIAGNOSIS — D509 Iron deficiency anemia, unspecified: Secondary | ICD-10-CM

## 2017-03-11 DIAGNOSIS — D508 Other iron deficiency anemias: Secondary | ICD-10-CM

## 2017-03-11 LAB — CBC WITH DIFFERENTIAL/PLATELET
Basophils Absolute: 0 10*3/uL (ref 0.0–0.1)
Basophils Relative: 0 %
EOS ABS: 0.1 10*3/uL (ref 0.0–0.5)
Eosinophils Relative: 2 %
HCT: 33.5 % — ABNORMAL LOW (ref 34.8–46.6)
HEMOGLOBIN: 10 g/dL — AB (ref 11.6–15.9)
LYMPHS ABS: 0.3 10*3/uL — AB (ref 0.9–3.3)
LYMPHS PCT: 7 %
MCH: 29.3 pg (ref 25.1–34.0)
MCHC: 29.9 g/dL — AB (ref 31.5–36.0)
MCV: 98.2 fL (ref 79.5–101.0)
Monocytes Absolute: 0.4 10*3/uL (ref 0.1–0.9)
Monocytes Relative: 11 %
NEUTROS PCT: 80 %
Neutro Abs: 3.1 10*3/uL (ref 1.5–6.5)
PLATELETS: 154 10*3/uL (ref 145–400)
RBC: 3.41 MIL/uL — AB (ref 3.70–5.45)
RDW: 17.7 % — ABNORMAL HIGH (ref 11.2–14.5)
WBC: 3.9 10*3/uL (ref 3.9–10.3)

## 2017-03-11 LAB — SAMPLE TO BLOOD BANK

## 2017-03-11 LAB — FERRITIN: Ferritin: 285 ng/mL — ABNORMAL HIGH (ref 9–269)

## 2017-03-11 MED ORDER — SODIUM CHLORIDE 0.9 % IV SOLN
510.0000 mg | Freq: Once | INTRAVENOUS | Status: AC
  Administered 2017-03-11: 510 mg via INTRAVENOUS
  Filled 2017-03-11: qty 17

## 2017-03-11 NOTE — Progress Notes (Signed)
Pt tolerated iron well.  VS stable.  Stayed for entire 30 min post infusion.

## 2017-03-11 NOTE — Patient Instructions (Signed)

## 2017-03-12 ENCOUNTER — Ambulatory Visit: Payer: Medicare Other | Admitting: Gastroenterology

## 2017-03-12 DIAGNOSIS — K2971 Gastritis, unspecified, with bleeding: Secondary | ICD-10-CM | POA: Diagnosis not present

## 2017-03-12 DIAGNOSIS — D500 Iron deficiency anemia secondary to blood loss (chronic): Secondary | ICD-10-CM | POA: Diagnosis not present

## 2017-03-12 DIAGNOSIS — N183 Chronic kidney disease, stage 3 (moderate): Secondary | ICD-10-CM | POA: Diagnosis not present

## 2017-03-12 DIAGNOSIS — I5042 Chronic combined systolic (congestive) and diastolic (congestive) heart failure: Secondary | ICD-10-CM | POA: Diagnosis not present

## 2017-03-12 DIAGNOSIS — I350 Nonrheumatic aortic (valve) stenosis: Secondary | ICD-10-CM | POA: Diagnosis not present

## 2017-03-12 DIAGNOSIS — I1310 Hypertensive heart and chronic kidney disease without heart failure, with stage 1 through stage 4 chronic kidney disease, or unspecified chronic kidney disease: Secondary | ICD-10-CM | POA: Diagnosis not present

## 2017-03-13 DIAGNOSIS — I5042 Chronic combined systolic (congestive) and diastolic (congestive) heart failure: Secondary | ICD-10-CM | POA: Diagnosis not present

## 2017-03-13 DIAGNOSIS — D500 Iron deficiency anemia secondary to blood loss (chronic): Secondary | ICD-10-CM | POA: Diagnosis not present

## 2017-03-13 DIAGNOSIS — I350 Nonrheumatic aortic (valve) stenosis: Secondary | ICD-10-CM | POA: Diagnosis not present

## 2017-03-13 DIAGNOSIS — K2971 Gastritis, unspecified, with bleeding: Secondary | ICD-10-CM | POA: Diagnosis not present

## 2017-03-13 DIAGNOSIS — N183 Chronic kidney disease, stage 3 (moderate): Secondary | ICD-10-CM | POA: Diagnosis not present

## 2017-03-13 DIAGNOSIS — I1310 Hypertensive heart and chronic kidney disease without heart failure, with stage 1 through stage 4 chronic kidney disease, or unspecified chronic kidney disease: Secondary | ICD-10-CM | POA: Diagnosis not present

## 2017-03-14 DIAGNOSIS — K2971 Gastritis, unspecified, with bleeding: Secondary | ICD-10-CM | POA: Diagnosis not present

## 2017-03-14 DIAGNOSIS — I350 Nonrheumatic aortic (valve) stenosis: Secondary | ICD-10-CM | POA: Diagnosis not present

## 2017-03-14 DIAGNOSIS — N183 Chronic kidney disease, stage 3 (moderate): Secondary | ICD-10-CM | POA: Diagnosis not present

## 2017-03-14 DIAGNOSIS — I5042 Chronic combined systolic (congestive) and diastolic (congestive) heart failure: Secondary | ICD-10-CM | POA: Diagnosis not present

## 2017-03-14 DIAGNOSIS — D500 Iron deficiency anemia secondary to blood loss (chronic): Secondary | ICD-10-CM | POA: Diagnosis not present

## 2017-03-14 DIAGNOSIS — I1310 Hypertensive heart and chronic kidney disease without heart failure, with stage 1 through stage 4 chronic kidney disease, or unspecified chronic kidney disease: Secondary | ICD-10-CM | POA: Diagnosis not present

## 2017-03-15 DIAGNOSIS — K2971 Gastritis, unspecified, with bleeding: Secondary | ICD-10-CM | POA: Diagnosis not present

## 2017-03-15 DIAGNOSIS — N183 Chronic kidney disease, stage 3 (moderate): Secondary | ICD-10-CM | POA: Diagnosis not present

## 2017-03-15 DIAGNOSIS — I350 Nonrheumatic aortic (valve) stenosis: Secondary | ICD-10-CM | POA: Diagnosis not present

## 2017-03-15 DIAGNOSIS — I1310 Hypertensive heart and chronic kidney disease without heart failure, with stage 1 through stage 4 chronic kidney disease, or unspecified chronic kidney disease: Secondary | ICD-10-CM | POA: Diagnosis not present

## 2017-03-15 DIAGNOSIS — I5042 Chronic combined systolic (congestive) and diastolic (congestive) heart failure: Secondary | ICD-10-CM | POA: Diagnosis not present

## 2017-03-15 DIAGNOSIS — D500 Iron deficiency anemia secondary to blood loss (chronic): Secondary | ICD-10-CM | POA: Diagnosis not present

## 2017-03-18 DIAGNOSIS — N183 Chronic kidney disease, stage 3 (moderate): Secondary | ICD-10-CM | POA: Diagnosis not present

## 2017-03-18 DIAGNOSIS — I1310 Hypertensive heart and chronic kidney disease without heart failure, with stage 1 through stage 4 chronic kidney disease, or unspecified chronic kidney disease: Secondary | ICD-10-CM | POA: Diagnosis not present

## 2017-03-18 DIAGNOSIS — D500 Iron deficiency anemia secondary to blood loss (chronic): Secondary | ICD-10-CM | POA: Diagnosis not present

## 2017-03-18 DIAGNOSIS — I350 Nonrheumatic aortic (valve) stenosis: Secondary | ICD-10-CM | POA: Diagnosis not present

## 2017-03-18 DIAGNOSIS — I5042 Chronic combined systolic (congestive) and diastolic (congestive) heart failure: Secondary | ICD-10-CM | POA: Diagnosis not present

## 2017-03-18 DIAGNOSIS — K2971 Gastritis, unspecified, with bleeding: Secondary | ICD-10-CM | POA: Diagnosis not present

## 2017-03-19 ENCOUNTER — Ambulatory Visit: Payer: Medicare Other | Admitting: Nurse Practitioner

## 2017-03-19 DIAGNOSIS — N183 Chronic kidney disease, stage 3 (moderate): Secondary | ICD-10-CM | POA: Diagnosis not present

## 2017-03-19 DIAGNOSIS — I5042 Chronic combined systolic (congestive) and diastolic (congestive) heart failure: Secondary | ICD-10-CM | POA: Diagnosis not present

## 2017-03-19 DIAGNOSIS — K2971 Gastritis, unspecified, with bleeding: Secondary | ICD-10-CM | POA: Diagnosis not present

## 2017-03-19 DIAGNOSIS — I1310 Hypertensive heart and chronic kidney disease without heart failure, with stage 1 through stage 4 chronic kidney disease, or unspecified chronic kidney disease: Secondary | ICD-10-CM | POA: Diagnosis not present

## 2017-03-19 DIAGNOSIS — D500 Iron deficiency anemia secondary to blood loss (chronic): Secondary | ICD-10-CM | POA: Diagnosis not present

## 2017-03-19 DIAGNOSIS — I350 Nonrheumatic aortic (valve) stenosis: Secondary | ICD-10-CM | POA: Diagnosis not present

## 2017-03-20 DIAGNOSIS — I1310 Hypertensive heart and chronic kidney disease without heart failure, with stage 1 through stage 4 chronic kidney disease, or unspecified chronic kidney disease: Secondary | ICD-10-CM | POA: Diagnosis not present

## 2017-03-20 DIAGNOSIS — N183 Chronic kidney disease, stage 3 (moderate): Secondary | ICD-10-CM | POA: Diagnosis not present

## 2017-03-20 DIAGNOSIS — D500 Iron deficiency anemia secondary to blood loss (chronic): Secondary | ICD-10-CM | POA: Diagnosis not present

## 2017-03-20 DIAGNOSIS — I350 Nonrheumatic aortic (valve) stenosis: Secondary | ICD-10-CM | POA: Diagnosis not present

## 2017-03-20 DIAGNOSIS — I5042 Chronic combined systolic (congestive) and diastolic (congestive) heart failure: Secondary | ICD-10-CM | POA: Diagnosis not present

## 2017-03-20 DIAGNOSIS — K2971 Gastritis, unspecified, with bleeding: Secondary | ICD-10-CM | POA: Diagnosis not present

## 2017-03-22 ENCOUNTER — Ambulatory Visit: Payer: Medicare Other | Admitting: Nurse Practitioner

## 2017-03-25 DIAGNOSIS — I1310 Hypertensive heart and chronic kidney disease without heart failure, with stage 1 through stage 4 chronic kidney disease, or unspecified chronic kidney disease: Secondary | ICD-10-CM | POA: Diagnosis not present

## 2017-03-25 DIAGNOSIS — I350 Nonrheumatic aortic (valve) stenosis: Secondary | ICD-10-CM | POA: Diagnosis not present

## 2017-03-25 DIAGNOSIS — K2971 Gastritis, unspecified, with bleeding: Secondary | ICD-10-CM | POA: Diagnosis not present

## 2017-03-25 DIAGNOSIS — D500 Iron deficiency anemia secondary to blood loss (chronic): Secondary | ICD-10-CM | POA: Diagnosis not present

## 2017-03-25 DIAGNOSIS — N183 Chronic kidney disease, stage 3 (moderate): Secondary | ICD-10-CM | POA: Diagnosis not present

## 2017-03-25 DIAGNOSIS — I5042 Chronic combined systolic (congestive) and diastolic (congestive) heart failure: Secondary | ICD-10-CM | POA: Diagnosis not present

## 2017-03-26 DIAGNOSIS — I1310 Hypertensive heart and chronic kidney disease without heart failure, with stage 1 through stage 4 chronic kidney disease, or unspecified chronic kidney disease: Secondary | ICD-10-CM | POA: Diagnosis not present

## 2017-03-26 DIAGNOSIS — I350 Nonrheumatic aortic (valve) stenosis: Secondary | ICD-10-CM | POA: Diagnosis not present

## 2017-03-26 DIAGNOSIS — D500 Iron deficiency anemia secondary to blood loss (chronic): Secondary | ICD-10-CM | POA: Diagnosis not present

## 2017-03-26 DIAGNOSIS — N183 Chronic kidney disease, stage 3 (moderate): Secondary | ICD-10-CM | POA: Diagnosis not present

## 2017-03-26 DIAGNOSIS — I5042 Chronic combined systolic (congestive) and diastolic (congestive) heart failure: Secondary | ICD-10-CM | POA: Diagnosis not present

## 2017-03-26 DIAGNOSIS — K2971 Gastritis, unspecified, with bleeding: Secondary | ICD-10-CM | POA: Diagnosis not present

## 2017-03-27 DIAGNOSIS — Z9981 Dependence on supplemental oxygen: Secondary | ICD-10-CM | POA: Diagnosis not present

## 2017-03-27 DIAGNOSIS — M810 Age-related osteoporosis without current pathological fracture: Secondary | ICD-10-CM | POA: Diagnosis not present

## 2017-03-27 DIAGNOSIS — D649 Anemia, unspecified: Secondary | ICD-10-CM | POA: Diagnosis not present

## 2017-03-27 DIAGNOSIS — K31819 Angiodysplasia of stomach and duodenum without bleeding: Secondary | ICD-10-CM | POA: Diagnosis not present

## 2017-03-27 DIAGNOSIS — E611 Iron deficiency: Secondary | ICD-10-CM | POA: Diagnosis not present

## 2017-03-27 DIAGNOSIS — J42 Unspecified chronic bronchitis: Secondary | ICD-10-CM | POA: Diagnosis not present

## 2017-03-28 DIAGNOSIS — I5042 Chronic combined systolic (congestive) and diastolic (congestive) heart failure: Secondary | ICD-10-CM | POA: Diagnosis not present

## 2017-03-28 DIAGNOSIS — D500 Iron deficiency anemia secondary to blood loss (chronic): Secondary | ICD-10-CM | POA: Diagnosis not present

## 2017-03-28 DIAGNOSIS — K2971 Gastritis, unspecified, with bleeding: Secondary | ICD-10-CM | POA: Diagnosis not present

## 2017-03-28 DIAGNOSIS — I1310 Hypertensive heart and chronic kidney disease without heart failure, with stage 1 through stage 4 chronic kidney disease, or unspecified chronic kidney disease: Secondary | ICD-10-CM | POA: Diagnosis not present

## 2017-03-28 DIAGNOSIS — N183 Chronic kidney disease, stage 3 (moderate): Secondary | ICD-10-CM | POA: Diagnosis not present

## 2017-03-28 DIAGNOSIS — I350 Nonrheumatic aortic (valve) stenosis: Secondary | ICD-10-CM | POA: Diagnosis not present

## 2017-03-29 DIAGNOSIS — K2971 Gastritis, unspecified, with bleeding: Secondary | ICD-10-CM | POA: Diagnosis not present

## 2017-03-29 DIAGNOSIS — D500 Iron deficiency anemia secondary to blood loss (chronic): Secondary | ICD-10-CM | POA: Diagnosis not present

## 2017-03-29 DIAGNOSIS — I350 Nonrheumatic aortic (valve) stenosis: Secondary | ICD-10-CM | POA: Diagnosis not present

## 2017-03-29 DIAGNOSIS — N183 Chronic kidney disease, stage 3 (moderate): Secondary | ICD-10-CM | POA: Diagnosis not present

## 2017-03-29 DIAGNOSIS — I5042 Chronic combined systolic (congestive) and diastolic (congestive) heart failure: Secondary | ICD-10-CM | POA: Diagnosis not present

## 2017-03-29 DIAGNOSIS — I1310 Hypertensive heart and chronic kidney disease without heart failure, with stage 1 through stage 4 chronic kidney disease, or unspecified chronic kidney disease: Secondary | ICD-10-CM | POA: Diagnosis not present

## 2017-04-01 ENCOUNTER — Ambulatory Visit (INDEPENDENT_AMBULATORY_CARE_PROVIDER_SITE_OTHER): Payer: Medicare Other | Admitting: Pulmonary Disease

## 2017-04-01 ENCOUNTER — Encounter: Payer: Self-pay | Admitting: Pulmonary Disease

## 2017-04-01 VITALS — BP 124/84 | HR 85 | Ht 62.0 in | Wt 205.0 lb

## 2017-04-01 DIAGNOSIS — J9611 Chronic respiratory failure with hypoxia: Secondary | ICD-10-CM | POA: Diagnosis not present

## 2017-04-01 NOTE — Patient Instructions (Signed)
Continue using 2 liters oxygen at home Check your oxygen meter at home and goal is to keep this at 90% or above  Follow up in 4 months

## 2017-04-01 NOTE — Progress Notes (Signed)
Sophia [name], Critical Care, and Sleep Medicine  Chief Complaint  Patient presents with  . pulm consult    Pt referred by Dr. [name] MD. Pt is not breathing well, on 2 liters O2 con't. Pt has some chest tightness, dry cough, wheezing on and off since hospital visit in Dec 2018    Vital signs: BP 124/84 (BP Location: Left Arm, Cuff Size: Normal)   Pulse 85   Ht 5\' 2"  (1.575 m)   Wt 205 lb (93 kg)   SpO2 94%   BMI 37.49 kg/m   History of Present Illness: Sophia [name] is a 84 y.o. female with hypoxia.  She was in hospital recently for heart failure and anemia.  Prior to this she had a bronchitis.  She was started on 2 liters oxygen 24/7.  She wants to know if she still needs this.  She gets occasional cough.  She isn't having wheeze, sputum, or chest pain.  Her leg swelling has gotten better.  She denies chest pain.    There is no history of asthma, COPD, pneumonia, or TB.  She does have history of heart failure and valvular heart disease.   Physical Exam:  General - pleasant Eyes - pupils reactive ENT - no sinus tenderness, no oral exudate, no LAN Cardiac - regular, 2/6 systolic murmur Chest - no wheeze, rales Abd - soft, non tender Ext - no edema Skin - no rashes Neuro - normal strength Psych - normal mood  Discussion: She has heart failure and anemia.  She was found to have hypoxia during recent hospital stay.  She wants to minimize any testing that is done.  She continued to have oxygen desaturation on room air at rest in office today.  Assessment/Plan:  Chronic respiratory failure with hypoxia. - continue 2 liters oxygen    Patient Instructions  Continue using 2 liters oxygen at home Check your oxygen meter at home and goal is to keep this at 90% or above  Follow up in 4 months    Sophia Mires, MD [place] 04/01/2017, 2:57 PM Pager:  [phone]  Flow Sheet  Review of Systems: Constitutional: Negative for fever  and unexpected weight change.  HENT: Negative for congestion, dental problem, ear pain, nosebleeds, postnasal drip, rhinorrhea, sinus pressure, sneezing, sore throat and trouble swallowing.   Eyes: Negative for redness and itching.  Respiratory: Positive for cough, chest tightness, shortness of breath and wheezing.   Cardiovascular: Negative for palpitations and leg swelling.  Gastrointestinal: Negative for nausea and vomiting.  Genitourinary: Negative for dysuria.  Musculoskeletal: Negative for joint swelling.  Skin: Negative for rash.  Allergic/Immunologic: Negative.  Negative for environmental allergies, food allergies and immunocompromised state.  Neurological: Negative for headaches.  Hematological: Does not bruise/bleed easily.  Psychiatric/Behavioral: Negative for dysphoric mood. The patient is nervous/anxious.    Past Medical History: She  has a past medical history of Anemia, Anxiety, Aortic stenosis, Arthritis, Blood transfusion, Cataracts, bilateral, Congestive heart failure ([place]), Depression, Diverticulosis (03/14/10), Duodenitis (03/15/10), DVT (deep venous thrombosis) (HCC), Dyslipidemia, GAVE (gastric antral vascular ectasia), GERD (gastroesophageal reflux disease), Heart murmur, Hemorrhagic gastritis (03/15/10), Hiatal hernia, Hyperlipidemia, Hyperplastic colon polyp, Hypertension, Neuropathy, Osteoporosis, Presbyesophagus, and Unspecified deficiency anemia (08/24/2013).  Past Surgical History: She  has a past surgical history that includes Cholecystectomy; ORIF wrist fracture (Right); fracture left knee (1993); Foot surgery (1998); Tubal ligation; plantar fasciitis repair; Cataract extraction, bilateral; Esophagogastroduodenoscopy (N/A, 04/28/2012); Hot hemostasis (N/A, 04/28/2012); left eye cornea  surgery (yrs ago); Esophagogastroduodenoscopy (  N/A, 04/12/2014); Hot hemostasis (N/A, 04/12/2014); Hot hemostasis (N/A, 07/08/2014); enteroscopy (N/A, 07/08/2014); and Esophagogastroduodenoscopy (N/A,  02/06/2017).  Family History: Her family history includes Colon cancer in her sister; Kidney cancer in her brother and son; Prostate cancer in her father; Uterine cancer in her sister.  Social History: She  reports that she quit smoking about 28 years ago. Her smoking use included cigarettes. She has a 10.00 pack-year smoking history. she has never used smokeless tobacco. She reports that she does not drink alcohol or use drugs.  Medications: Allergies as of 04/01/2017      Reactions   Nutritional Supplements Anaphylaxis   [name] Shortness Of Breath   [name] [alendronate Sodium] Other (See Comments)   Reaction:  GI upset    Lipitor [atorvastatin] Other (See Comments)   Reaction:  Muscle aches    Pregabalin Nausea And Vomiting   [name] Nausea And Vomiting   Wasp Venom Hives      Medication List        Accurate as of 04/01/17  2:57 PM. Always use your most recent med list.          acetaminophen 500 MG tablet Commonly known as:  TYLENOL Take 500 mg by mouth 2 (two) times daily as needed for mild pain or fever.   albuterol 108 (90 Base) MCG/ACT inhaler Commonly known as:  PROVENTIL HFA;VENTOLIN HFA Inhale 1-2 puffs into the lungs every 6 (six) hours as needed for wheezing or shortness of breath.   ALPRAZolam 0.25 MG tablet Commonly known as:  XANAX Take 1 tablet (0.25 mg total) by mouth 2 (two) times daily as needed for anxiety.   calcium-vitamin D 500-200 MG-UNIT tablet Take by mouth.   cholecalciferol 1000 units tablet Commonly known as:  VITAMIN D Take 2,000 Units by mouth daily.   DEXILANT 60 MG capsule Generic drug:  dexlansoprazole TAKE 1 TABLET BY MOUTH DAILY EVERY MORNING.   EPIPEN 2-PAK 0.3 mg/0.3 mL Soaj injection Generic drug:  EPINEPHrine Inject 0.3 mg into the muscle once as needed (for severe allergic reaction).   fluorometholone 0.1 % ophthalmic suspension Commonly known as:  FML Place 1 drop into the left eye daily.     folic acid 389 MCG tablet Commonly known as:  FOLVITE Take 800 mcg by mouth daily.   gabapentin 400 MG capsule Commonly known as:  NEURONTIN Take 2 capsules (800 mg total) by mouth 2 (two) times daily.   hydroxypropyl methylcellulose / hypromellose 2.5 % ophthalmic solution Commonly known as:  ISOPTO TEARS / GONIOVISC Place 1-2 drops into the right eye as needed for dry eyes.   PROLIA 60 MG/ML Soln injection Generic drug:  denosumab Inject 60 mg into the skin every 6 (six) months.   sertraline 100 MG tablet Commonly known as:  ZOLOFT Take 100 mg by mouth daily.   verapamil 240 MG CR tablet Commonly known as:  CALAN-SR Take 240 mg by mouth daily.

## 2017-04-01 NOTE — Progress Notes (Signed)
   Subjective:    Patient ID: Sophia [name], female    DOB: 21-Mar-1928, 84 y.o.   MRN: [id number]  HPI    Review of Systems  Constitutional: Negative for fever and unexpected weight change.  HENT: Negative for congestion, dental problem, ear pain, nosebleeds, postnasal drip, rhinorrhea, sinus pressure, sneezing, sore throat and trouble swallowing.   Eyes: Negative for redness and itching.  Respiratory: Positive for cough, chest tightness, shortness of breath and wheezing.   Cardiovascular: Negative for palpitations and leg swelling.  Gastrointestinal: Negative for nausea and vomiting.  Genitourinary: Negative for dysuria.  Musculoskeletal: Negative for joint swelling.  Skin: Negative for rash.  Allergic/Immunologic: Negative.  Negative for environmental allergies, food allergies and immunocompromised state.  Neurological: Negative for headaches.  Hematological: Does not bruise/bleed easily.  Psychiatric/Behavioral: Negative for dysphoric mood. The patient is nervous/anxious.        Objective:   Physical Exam        Assessment & Plan:

## 2017-04-02 ENCOUNTER — Institutional Professional Consult (permissible substitution): Payer: Medicare Other | Admitting: Pulmonary Disease

## 2017-04-02 DIAGNOSIS — I5042 Chronic combined systolic (congestive) and diastolic (congestive) heart failure: Secondary | ICD-10-CM | POA: Diagnosis not present

## 2017-04-02 DIAGNOSIS — N183 Chronic kidney disease, stage 3 (moderate): Secondary | ICD-10-CM | POA: Diagnosis not present

## 2017-04-02 DIAGNOSIS — I1310 Hypertensive heart and chronic kidney disease without heart failure, with stage 1 through stage 4 chronic kidney disease, or unspecified chronic kidney disease: Secondary | ICD-10-CM | POA: Diagnosis not present

## 2017-04-02 DIAGNOSIS — I350 Nonrheumatic aortic (valve) stenosis: Secondary | ICD-10-CM | POA: Diagnosis not present

## 2017-04-02 DIAGNOSIS — K2971 Gastritis, unspecified, with bleeding: Secondary | ICD-10-CM | POA: Diagnosis not present

## 2017-04-02 DIAGNOSIS — D500 Iron deficiency anemia secondary to blood loss (chronic): Secondary | ICD-10-CM | POA: Diagnosis not present

## 2017-04-03 DIAGNOSIS — I1310 Hypertensive heart and chronic kidney disease without heart failure, with stage 1 through stage 4 chronic kidney disease, or unspecified chronic kidney disease: Secondary | ICD-10-CM | POA: Diagnosis not present

## 2017-04-03 DIAGNOSIS — D500 Iron deficiency anemia secondary to blood loss (chronic): Secondary | ICD-10-CM | POA: Diagnosis not present

## 2017-04-03 DIAGNOSIS — K2971 Gastritis, unspecified, with bleeding: Secondary | ICD-10-CM | POA: Diagnosis not present

## 2017-04-03 DIAGNOSIS — N183 Chronic kidney disease, stage 3 (moderate): Secondary | ICD-10-CM | POA: Diagnosis not present

## 2017-04-03 DIAGNOSIS — I350 Nonrheumatic aortic (valve) stenosis: Secondary | ICD-10-CM | POA: Diagnosis not present

## 2017-04-03 DIAGNOSIS — I5042 Chronic combined systolic (congestive) and diastolic (congestive) heart failure: Secondary | ICD-10-CM | POA: Diagnosis not present

## 2017-04-04 DIAGNOSIS — I1310 Hypertensive heart and chronic kidney disease without heart failure, with stage 1 through stage 4 chronic kidney disease, or unspecified chronic kidney disease: Secondary | ICD-10-CM | POA: Diagnosis not present

## 2017-04-04 DIAGNOSIS — I5042 Chronic combined systolic (congestive) and diastolic (congestive) heart failure: Secondary | ICD-10-CM | POA: Diagnosis not present

## 2017-04-04 DIAGNOSIS — D500 Iron deficiency anemia secondary to blood loss (chronic): Secondary | ICD-10-CM | POA: Diagnosis not present

## 2017-04-04 DIAGNOSIS — I350 Nonrheumatic aortic (valve) stenosis: Secondary | ICD-10-CM | POA: Diagnosis not present

## 2017-04-04 DIAGNOSIS — K2971 Gastritis, unspecified, with bleeding: Secondary | ICD-10-CM | POA: Diagnosis not present

## 2017-04-04 DIAGNOSIS — N183 Chronic kidney disease, stage 3 (moderate): Secondary | ICD-10-CM | POA: Diagnosis not present

## 2017-04-05 DIAGNOSIS — N183 Chronic kidney disease, stage 3 (moderate): Secondary | ICD-10-CM | POA: Diagnosis not present

## 2017-04-05 DIAGNOSIS — I350 Nonrheumatic aortic (valve) stenosis: Secondary | ICD-10-CM | POA: Diagnosis not present

## 2017-04-05 DIAGNOSIS — I5042 Chronic combined systolic (congestive) and diastolic (congestive) heart failure: Secondary | ICD-10-CM | POA: Diagnosis not present

## 2017-04-05 DIAGNOSIS — I1310 Hypertensive heart and chronic kidney disease without heart failure, with stage 1 through stage 4 chronic kidney disease, or unspecified chronic kidney disease: Secondary | ICD-10-CM | POA: Diagnosis not present

## 2017-04-05 DIAGNOSIS — K2971 Gastritis, unspecified, with bleeding: Secondary | ICD-10-CM | POA: Diagnosis not present

## 2017-04-05 DIAGNOSIS — D500 Iron deficiency anemia secondary to blood loss (chronic): Secondary | ICD-10-CM | POA: Diagnosis not present

## 2017-04-08 ENCOUNTER — Inpatient Hospital Stay: Payer: Medicare Other

## 2017-04-08 ENCOUNTER — Inpatient Hospital Stay: Payer: Medicare Other | Attending: Hematology and Oncology

## 2017-04-08 DIAGNOSIS — D509 Iron deficiency anemia, unspecified: Secondary | ICD-10-CM | POA: Insufficient documentation

## 2017-04-08 DIAGNOSIS — D539 Nutritional anemia, unspecified: Secondary | ICD-10-CM

## 2017-04-08 DIAGNOSIS — D500 Iron deficiency anemia secondary to blood loss (chronic): Secondary | ICD-10-CM | POA: Diagnosis present

## 2017-04-08 LAB — FERRITIN: FERRITIN: 272 ng/mL — AB (ref 9–269)

## 2017-04-08 LAB — CBC WITH DIFFERENTIAL/PLATELET
BASOS PCT: 0 %
Basophils Absolute: 0 10*3/uL (ref 0.0–0.1)
EOS ABS: 0.1 10*3/uL (ref 0.0–0.5)
EOS PCT: 1 %
HCT: 38.4 % (ref 34.8–46.6)
Hemoglobin: 12.1 g/dL (ref 11.6–15.9)
LYMPHS ABS: 0.6 10*3/uL — AB (ref 0.9–3.3)
Lymphocytes Relative: 13 %
MCH: 30.3 pg (ref 25.1–34.0)
MCHC: 31.5 g/dL (ref 31.5–36.0)
MCV: 96.2 fL (ref 79.5–101.0)
Monocytes Absolute: 0.4 10*3/uL (ref 0.1–0.9)
Monocytes Relative: 7 %
Neutro Abs: 3.8 10*3/uL (ref 1.5–6.5)
Neutrophils Relative %: 79 %
PLATELETS: 151 10*3/uL (ref 145–400)
RBC: 3.99 MIL/uL (ref 3.70–5.45)
RDW: 14.4 % (ref 11.2–14.5)
WBC: 4.9 10*3/uL (ref 3.9–10.3)

## 2017-04-08 LAB — SAMPLE TO BLOOD BANK

## 2017-04-09 ENCOUNTER — Telehealth: Payer: Self-pay | Admitting: *Deleted

## 2017-04-09 NOTE — Telephone Encounter (Signed)
Notified of message below

## 2017-04-09 NOTE — Telephone Encounter (Signed)
-----   Message from [name], MD sent at 04/09/2017 11:20 AM EST ----- Regarding: pls let her know ferritin level is high   ----- Message ----- From: Interface, Lab In [place] Sent: 04/08/2017   1:29 PM To: [name], MD

## 2017-04-10 DIAGNOSIS — I350 Nonrheumatic aortic (valve) stenosis: Secondary | ICD-10-CM | POA: Diagnosis not present

## 2017-04-10 DIAGNOSIS — D500 Iron deficiency anemia secondary to blood loss (chronic): Secondary | ICD-10-CM | POA: Diagnosis not present

## 2017-04-10 DIAGNOSIS — I5042 Chronic combined systolic (congestive) and diastolic (congestive) heart failure: Secondary | ICD-10-CM | POA: Diagnosis not present

## 2017-04-10 DIAGNOSIS — I1310 Hypertensive heart and chronic kidney disease without heart failure, with stage 1 through stage 4 chronic kidney disease, or unspecified chronic kidney disease: Secondary | ICD-10-CM | POA: Diagnosis not present

## 2017-04-10 DIAGNOSIS — K2971 Gastritis, unspecified, with bleeding: Secondary | ICD-10-CM | POA: Diagnosis not present

## 2017-04-10 DIAGNOSIS — N183 Chronic kidney disease, stage 3 (moderate): Secondary | ICD-10-CM | POA: Diagnosis not present

## 2017-04-12 DIAGNOSIS — I350 Nonrheumatic aortic (valve) stenosis: Secondary | ICD-10-CM | POA: Diagnosis not present

## 2017-04-12 DIAGNOSIS — N183 Chronic kidney disease, stage 3 (moderate): Secondary | ICD-10-CM | POA: Diagnosis not present

## 2017-04-12 DIAGNOSIS — I1310 Hypertensive heart and chronic kidney disease without heart failure, with stage 1 through stage 4 chronic kidney disease, or unspecified chronic kidney disease: Secondary | ICD-10-CM | POA: Diagnosis not present

## 2017-04-12 DIAGNOSIS — I5042 Chronic combined systolic (congestive) and diastolic (congestive) heart failure: Secondary | ICD-10-CM | POA: Diagnosis not present

## 2017-04-12 DIAGNOSIS — K2971 Gastritis, unspecified, with bleeding: Secondary | ICD-10-CM | POA: Diagnosis not present

## 2017-04-12 DIAGNOSIS — D500 Iron deficiency anemia secondary to blood loss (chronic): Secondary | ICD-10-CM | POA: Diagnosis not present

## 2017-04-18 DIAGNOSIS — N183 Chronic kidney disease, stage 3 (moderate): Secondary | ICD-10-CM | POA: Diagnosis not present

## 2017-04-18 DIAGNOSIS — I5042 Chronic combined systolic (congestive) and diastolic (congestive) heart failure: Secondary | ICD-10-CM | POA: Diagnosis not present

## 2017-04-18 DIAGNOSIS — I350 Nonrheumatic aortic (valve) stenosis: Secondary | ICD-10-CM | POA: Diagnosis not present

## 2017-04-18 DIAGNOSIS — D500 Iron deficiency anemia secondary to blood loss (chronic): Secondary | ICD-10-CM | POA: Diagnosis not present

## 2017-04-18 DIAGNOSIS — I1310 Hypertensive heart and chronic kidney disease without heart failure, with stage 1 through stage 4 chronic kidney disease, or unspecified chronic kidney disease: Secondary | ICD-10-CM | POA: Diagnosis not present

## 2017-04-18 DIAGNOSIS — K2971 Gastritis, unspecified, with bleeding: Secondary | ICD-10-CM | POA: Diagnosis not present

## 2017-04-23 DIAGNOSIS — I5042 Chronic combined systolic (congestive) and diastolic (congestive) heart failure: Secondary | ICD-10-CM | POA: Diagnosis not present

## 2017-04-23 DIAGNOSIS — K2971 Gastritis, unspecified, with bleeding: Secondary | ICD-10-CM | POA: Diagnosis not present

## 2017-04-23 DIAGNOSIS — N183 Chronic kidney disease, stage 3 (moderate): Secondary | ICD-10-CM | POA: Diagnosis not present

## 2017-04-23 DIAGNOSIS — D500 Iron deficiency anemia secondary to blood loss (chronic): Secondary | ICD-10-CM | POA: Diagnosis not present

## 2017-04-23 DIAGNOSIS — I1310 Hypertensive heart and chronic kidney disease without heart failure, with stage 1 through stage 4 chronic kidney disease, or unspecified chronic kidney disease: Secondary | ICD-10-CM | POA: Diagnosis not present

## 2017-04-23 DIAGNOSIS — I350 Nonrheumatic aortic (valve) stenosis: Secondary | ICD-10-CM | POA: Diagnosis not present

## 2017-04-25 DIAGNOSIS — D500 Iron deficiency anemia secondary to blood loss (chronic): Secondary | ICD-10-CM | POA: Diagnosis not present

## 2017-04-25 DIAGNOSIS — I5042 Chronic combined systolic (congestive) and diastolic (congestive) heart failure: Secondary | ICD-10-CM | POA: Diagnosis not present

## 2017-04-25 DIAGNOSIS — N183 Chronic kidney disease, stage 3 (moderate): Secondary | ICD-10-CM | POA: Diagnosis not present

## 2017-04-25 DIAGNOSIS — I350 Nonrheumatic aortic (valve) stenosis: Secondary | ICD-10-CM | POA: Diagnosis not present

## 2017-04-25 DIAGNOSIS — K2971 Gastritis, unspecified, with bleeding: Secondary | ICD-10-CM | POA: Diagnosis not present

## 2017-04-25 DIAGNOSIS — I1310 Hypertensive heart and chronic kidney disease without heart failure, with stage 1 through stage 4 chronic kidney disease, or unspecified chronic kidney disease: Secondary | ICD-10-CM | POA: Diagnosis not present

## 2017-05-06 ENCOUNTER — Inpatient Hospital Stay: Payer: Medicare Other | Attending: Hematology and Oncology

## 2017-05-06 ENCOUNTER — Inpatient Hospital Stay: Payer: Medicare Other

## 2017-05-06 DIAGNOSIS — D500 Iron deficiency anemia secondary to blood loss (chronic): Secondary | ICD-10-CM | POA: Diagnosis not present

## 2017-05-06 DIAGNOSIS — K31811 Angiodysplasia of stomach and duodenum with bleeding: Secondary | ICD-10-CM | POA: Insufficient documentation

## 2017-05-06 DIAGNOSIS — D509 Iron deficiency anemia, unspecified: Secondary | ICD-10-CM

## 2017-05-06 DIAGNOSIS — D539 Nutritional anemia, unspecified: Secondary | ICD-10-CM

## 2017-05-06 LAB — CBC WITH DIFFERENTIAL/PLATELET
Basophils Absolute: 0 10*3/uL (ref 0.0–0.1)
Basophils Relative: 1 %
EOS PCT: 2 %
Eosinophils Absolute: 0.1 10*3/uL (ref 0.0–0.5)
HCT: 39.7 % (ref 34.8–46.6)
Hemoglobin: 12.9 g/dL (ref 11.6–15.9)
LYMPHS ABS: 0.7 10*3/uL — AB (ref 0.9–3.3)
LYMPHS PCT: 16 %
MCH: 29.8 pg (ref 25.1–34.0)
MCHC: 32.4 g/dL (ref 31.5–36.0)
MCV: 92 fL (ref 79.5–101.0)
MONO ABS: 0.4 10*3/uL (ref 0.1–0.9)
Monocytes Relative: 9 %
Neutro Abs: 3.1 10*3/uL (ref 1.5–6.5)
Neutrophils Relative %: 72 %
PLATELETS: 186 10*3/uL (ref 145–400)
RBC: 4.32 MIL/uL (ref 3.70–5.45)
RDW: 14 % (ref 11.2–14.5)
WBC: 4.3 10*3/uL (ref 3.9–10.3)

## 2017-05-06 LAB — SAMPLE TO BLOOD BANK

## 2017-05-06 LAB — FERRITIN: Ferritin: 194 ng/mL (ref 9–269)

## 2017-05-06 NOTE — Progress Notes (Signed)
No iron treatment for today per Dr. Alvy Bimler. Pt requested a copy of her laboratory results.

## 2017-05-22 ENCOUNTER — Other Ambulatory Visit: Payer: Self-pay | Admitting: Gastroenterology

## 2017-06-03 ENCOUNTER — Inpatient Hospital Stay: Payer: Medicare Other

## 2017-06-03 DIAGNOSIS — D509 Iron deficiency anemia, unspecified: Secondary | ICD-10-CM

## 2017-06-03 DIAGNOSIS — D500 Iron deficiency anemia secondary to blood loss (chronic): Secondary | ICD-10-CM | POA: Diagnosis not present

## 2017-06-03 DIAGNOSIS — K31811 Angiodysplasia of stomach and duodenum with bleeding: Secondary | ICD-10-CM | POA: Diagnosis not present

## 2017-06-03 DIAGNOSIS — D539 Nutritional anemia, unspecified: Secondary | ICD-10-CM

## 2017-06-03 LAB — CBC WITH DIFFERENTIAL/PLATELET
Basophils Absolute: 0 10*3/uL (ref 0.0–0.1)
Basophils Relative: 1 %
EOS ABS: 0.2 10*3/uL (ref 0.0–0.5)
Eosinophils Relative: 4 %
HEMATOCRIT: 38.3 % (ref 34.8–46.6)
HEMOGLOBIN: 12.6 g/dL (ref 11.6–15.9)
LYMPHS ABS: 0.5 10*3/uL — AB (ref 0.9–3.3)
Lymphocytes Relative: 10 %
MCH: 29.9 pg (ref 25.1–34.0)
MCHC: 33 g/dL (ref 31.5–36.0)
MCV: 90.7 fL (ref 79.5–101.0)
Monocytes Absolute: 0.4 10*3/uL (ref 0.1–0.9)
Monocytes Relative: 9 %
NEUTROS ABS: 3.6 10*3/uL (ref 1.5–6.5)
NEUTROS PCT: 76 %
Platelets: 187 10*3/uL (ref 145–400)
RBC: 4.23 MIL/uL (ref 3.70–5.45)
RDW: 13.4 % (ref 11.2–14.5)
WBC: 4.7 10*3/uL (ref 3.9–10.3)

## 2017-06-03 LAB — SAMPLE TO BLOOD BANK

## 2017-07-02 ENCOUNTER — Inpatient Hospital Stay: Payer: Medicare Other

## 2017-07-02 ENCOUNTER — Inpatient Hospital Stay: Payer: Medicare Other | Attending: Hematology and Oncology

## 2017-07-02 VITALS — BP 126/87 | HR 83 | Temp 98.2°F | Resp 16

## 2017-07-02 DIAGNOSIS — D539 Nutritional anemia, unspecified: Secondary | ICD-10-CM

## 2017-07-02 DIAGNOSIS — K31811 Angiodysplasia of stomach and duodenum with bleeding: Secondary | ICD-10-CM | POA: Diagnosis not present

## 2017-07-02 DIAGNOSIS — D509 Iron deficiency anemia, unspecified: Secondary | ICD-10-CM

## 2017-07-02 DIAGNOSIS — D500 Iron deficiency anemia secondary to blood loss (chronic): Secondary | ICD-10-CM | POA: Diagnosis not present

## 2017-07-02 DIAGNOSIS — K31819 Angiodysplasia of stomach and duodenum without bleeding: Secondary | ICD-10-CM

## 2017-07-02 DIAGNOSIS — D508 Other iron deficiency anemias: Secondary | ICD-10-CM

## 2017-07-02 LAB — CBC WITH DIFFERENTIAL/PLATELET
BASOS ABS: 0 10*3/uL (ref 0.0–0.1)
Basophils Relative: 1 %
Eosinophils Absolute: 0.1 10*3/uL (ref 0.0–0.5)
Eosinophils Relative: 2 %
HEMATOCRIT: 34.1 % — AB (ref 34.8–46.6)
Hemoglobin: 11.3 g/dL — ABNORMAL LOW (ref 11.6–15.9)
LYMPHS PCT: 15 %
Lymphs Abs: 0.6 10*3/uL — ABNORMAL LOW (ref 0.9–3.3)
MCH: 30.2 pg (ref 25.1–34.0)
MCHC: 33.3 g/dL (ref 31.5–36.0)
MCV: 90.7 fL (ref 79.5–101.0)
Monocytes Absolute: 0.3 10*3/uL (ref 0.1–0.9)
Monocytes Relative: 9 %
NEUTROS ABS: 2.7 10*3/uL (ref 1.5–6.5)
NEUTROS PCT: 73 %
Platelets: 187 10*3/uL (ref 145–400)
RBC: 3.76 MIL/uL (ref 3.70–5.45)
RDW: 13.7 % (ref 11.2–14.5)
WBC: 3.7 10*3/uL — AB (ref 3.9–10.3)

## 2017-07-02 LAB — SAMPLE TO BLOOD BANK

## 2017-07-02 LAB — FERRITIN: Ferritin: 199 ng/mL (ref 9–269)

## 2017-07-02 MED ORDER — SODIUM CHLORIDE 0.9 % IV SOLN
510.0000 mg | Freq: Once | INTRAVENOUS | Status: AC
  Administered 2017-07-02: 510 mg via INTRAVENOUS
  Filled 2017-07-02: qty 17

## 2017-07-02 NOTE — Patient Instructions (Signed)

## 2017-07-03 ENCOUNTER — Telehealth: Payer: Self-pay | Admitting: Hematology and Oncology

## 2017-07-03 NOTE — Telephone Encounter (Signed)
Mailed patient calendar of upcoming appointment updates per 5/28 sch message

## 2017-07-29 ENCOUNTER — Ambulatory Visit: Payer: Medicare Other | Admitting: Hematology and Oncology

## 2017-07-29 ENCOUNTER — Ambulatory Visit: Payer: Medicare Other

## 2017-07-29 ENCOUNTER — Other Ambulatory Visit: Payer: Medicare Other

## 2017-07-29 DIAGNOSIS — H40013 Open angle with borderline findings, low risk, bilateral: Secondary | ICD-10-CM | POA: Diagnosis not present

## 2017-07-29 DIAGNOSIS — H1812 Bullous keratopathy, left eye: Secondary | ICD-10-CM | POA: Diagnosis not present

## 2017-07-29 DIAGNOSIS — H1811 Bullous keratopathy, right eye: Secondary | ICD-10-CM | POA: Diagnosis not present

## 2017-07-29 DIAGNOSIS — H1859 Other hereditary corneal dystrophies: Secondary | ICD-10-CM | POA: Diagnosis not present

## 2017-07-29 DIAGNOSIS — H1851 Endothelial corneal dystrophy: Secondary | ICD-10-CM | POA: Diagnosis not present

## 2017-08-05 ENCOUNTER — Inpatient Hospital Stay: Payer: Medicare Other | Attending: Hematology and Oncology

## 2017-08-05 ENCOUNTER — Encounter: Payer: Self-pay | Admitting: Hematology and Oncology

## 2017-08-05 ENCOUNTER — Inpatient Hospital Stay: Payer: Medicare Other

## 2017-08-05 ENCOUNTER — Inpatient Hospital Stay (HOSPITAL_BASED_OUTPATIENT_CLINIC_OR_DEPARTMENT_OTHER): Payer: Medicare Other | Admitting: Hematology and Oncology

## 2017-08-05 ENCOUNTER — Telehealth: Payer: Self-pay | Admitting: Hematology and Oncology

## 2017-08-05 VITALS — BP 118/60 | HR 77 | Resp 18

## 2017-08-05 DIAGNOSIS — D508 Other iron deficiency anemias: Secondary | ICD-10-CM

## 2017-08-05 DIAGNOSIS — J9611 Chronic respiratory failure with hypoxia: Secondary | ICD-10-CM | POA: Diagnosis not present

## 2017-08-05 DIAGNOSIS — D500 Iron deficiency anemia secondary to blood loss (chronic): Secondary | ICD-10-CM | POA: Diagnosis not present

## 2017-08-05 DIAGNOSIS — J9610 Chronic respiratory failure, unspecified whether with hypoxia or hypercapnia: Secondary | ICD-10-CM | POA: Insufficient documentation

## 2017-08-05 DIAGNOSIS — Z9981 Dependence on supplemental oxygen: Secondary | ICD-10-CM | POA: Diagnosis not present

## 2017-08-05 DIAGNOSIS — I350 Nonrheumatic aortic (valve) stenosis: Secondary | ICD-10-CM

## 2017-08-05 DIAGNOSIS — D539 Nutritional anemia, unspecified: Secondary | ICD-10-CM

## 2017-08-05 DIAGNOSIS — K31819 Angiodysplasia of stomach and duodenum without bleeding: Secondary | ICD-10-CM

## 2017-08-05 DIAGNOSIS — Z79899 Other long term (current) drug therapy: Secondary | ICD-10-CM | POA: Diagnosis not present

## 2017-08-05 DIAGNOSIS — D509 Iron deficiency anemia, unspecified: Secondary | ICD-10-CM

## 2017-08-05 LAB — CBC WITH DIFFERENTIAL/PLATELET
BASOS PCT: 0 %
Basophils Absolute: 0 10*3/uL (ref 0.0–0.1)
EOS ABS: 0.1 10*3/uL (ref 0.0–0.5)
EOS PCT: 2 %
HEMATOCRIT: 36.1 % (ref 34.8–46.6)
HEMOGLOBIN: 11.5 g/dL — AB (ref 11.6–15.9)
Lymphocytes Relative: 14 %
Lymphs Abs: 0.7 10*3/uL — ABNORMAL LOW (ref 0.9–3.3)
MCH: 30.5 pg (ref 25.1–34.0)
MCHC: 31.9 g/dL (ref 31.5–36.0)
MCV: 95.8 fL (ref 79.5–101.0)
MONO ABS: 0.4 10*3/uL (ref 0.1–0.9)
MONOS PCT: 9 %
NEUTROS ABS: 3.9 10*3/uL (ref 1.5–6.5)
Neutrophils Relative %: 75 %
Platelets: 188 10*3/uL (ref 145–400)
RBC: 3.77 MIL/uL (ref 3.70–5.45)
RDW: 14.2 % (ref 11.2–14.5)
WBC: 5.2 10*3/uL (ref 3.9–10.3)

## 2017-08-05 LAB — SAMPLE TO BLOOD BANK

## 2017-08-05 LAB — FERRITIN: Ferritin: 311 ng/mL — ABNORMAL HIGH (ref 11–307)

## 2017-08-05 MED ORDER — FERUMOXYTOL INJECTION 510 MG/17 ML
510.0000 mg | Freq: Once | INTRAVENOUS | Status: AC
  Administered 2017-08-05: 510 mg via INTRAVENOUS
  Filled 2017-08-05: qty 17

## 2017-08-05 NOTE — Telephone Encounter (Signed)
Gave patient AVS & Calendar.  °

## 2017-08-05 NOTE — Progress Notes (Signed)
[place] OFFICE PROGRESS NOTE  [name], MD  ASSESSMENT & PLAN:  Iron deficiency anemia due to chronic blood loss The patient likely had intermittent GI bleed She is symptomatic with fatigue I would proceed with intravenous iron infusion today She does not need blood transfusion The patient is getting more debilitated requiring oxygen therapy and wheelchair I recommend close follow-up/monitoring at home and we will try to see if advanced home care service can draw blood and proceed with intravenous iron infusion at home She agree with the plan of care  Aortic stenosis There is association between aortic stenosis and GI bleed. 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 Given her age, surgery is probably not recommended.  Continue medical management   Respiratory failure, chronic ([place]) She has chronic respiratory failure and oxygen dependent She has been prescribed long-term oxygen treatment She is debilitated  She will continue oxygen therapy as prescribed   Orders Placed This Encounter  Procedures  . Ambulatory referral to Home Health    Referral Priority:   Routine    Referral Type:   Home Health Care    Referral Reason:   Specialty Services Required    Requested Specialty:   [place]    Number of Visits Requested:   1    INTERVAL HISTORY: Sophia [name] 84 y.o. female returns for further follow-up in regards to iron deficiency anemia She has significant shortness of breath She looks debilitated today, receiving oxygen via nasal cannula and sitting on wheelchair She has lost some weight The patient denies any recent signs or symptoms of bleeding such as spontaneous epistaxis, hematuria or hematochezia.  SUMMARY OF HEMATOLOGIC HISTORY:  She was found to have abnormal CBC from recent blood count monitoring. The patient have chronic iron deficiency anemia due to recurrent GI bleed. She had history of GAVE status post numerous  endoscopies and local ablation therapy. 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 She received blood transfusion and iron treatment Since February 2018, she received intravenous iron every 4- 6 weeks  I have reviewed the past medical history, past surgical history, [name]; risedronate sodium; fosamax [alendronate sodium]; lipitor [atorvastatin]; pregabalin; tolterodine tartrate; and wasp venom.  MEDICATIONS:  Current Outpatient Medications  Medication Sig Dispense Refill  . acetaminophen (TYLENOL) 500 MG tablet Take 500 mg by mouth 2 (two) times daily as needed for mild pain or fever.    [name] albuterol (PROVENTIL HFA;VENTOLIN HFA) 108 (90 Base) MCG/ACT inhaler Inhale 1-2 puffs into the lungs every 6 (six) hours as needed for wheezing or shortness of breath. 1 Inhaler 0  . ALPRAZolam (XANAX) 0.25 MG tablet Take 1 tablet (0.25 mg total) by mouth 2 (two) times daily as needed for anxiety. 30 tablet 0  . Calcium Carb-Cholecalciferol (CALCIUM-VITAMIN D) 500-200 MG-UNIT tablet Take by mouth.    . cholecalciferol (VITAMIN D) 1000 units tablet Take 2,000 Units by mouth daily.    [name] denosumab (PROLIA) 60 MG/ML SOLN Inject 60 mg into the skin every 6 (six) months.     . DEXILANT 60 MG capsule TAKE 1  TABLET BY MOUTH DAILY EVERY MORNING. 30 capsule 6  . EPINEPHrine (EPIPEN 2-PAK) 0.3 mg/0.3 mL IJ SOAJ injection Inject 0.3 mg into the muscle once as needed (for severe allergic reaction).    . fluorometholone (FML) 0.1 % ophthalmic suspension Place 1 drop into the  left eye daily.     . folic acid (FOLVITE) 270 MCG tablet Take 800 mcg by mouth daily.    [name] gabapentin (NEURONTIN) 400 MG capsule Take 2 capsules (800 mg total) by mouth 2 (two) times daily. 60 capsule 0  . hydroxypropyl methylcellulose / hypromellose (ISOPTO TEARS / GONIOVISC) 2.5 % ophthalmic solution Place 1-2 drops into the right eye as needed for dry eyes.     [name] sertraline (ZOLOFT) 100 MG tablet Take 100 mg by mouth daily.     . verapamil (CALAN-SR) 240 MG CR tablet Take 240 mg by mouth daily.     No current facility-administered medications for this visit.    Facility-Administered Medications Ordered in Other Visits  Medication Dose Route Frequency Provider Last Rate Last Dose  . ferumoxytol (FERAHEME) 510 mg in sodium chloride 0.9 % 100 mL IVPB  510 mg Intravenous Once [name], MD 468 mL/hr at 08/05/17 1502 510 mg at 08/05/17 1502     REVIEW OF SYSTEMS:   Constitutional: Denies fevers, [name], nose, mouth, throat, and face: Denies mucositis or sore throat Cardiovascular: Denies palpitation, [name], [name], tingling  Behavioral/Psych: Mood is stable, no new changes  All other systems were reviewed with the patient and are negative.  PHYSICAL EXAMINATION: ECOG PERFORMANCE STATUS: 2 - Symptomatic, <50% confined to bed  Vitals:   08/05/17 1410  BP: 104/81  Pulse: 87  Resp: 18  Temp: 98.4 F (36.9 C)  SpO2: 92%   Filed Weights   08/05/17 1410  Weight: 194 lb 6.4 oz (88.2 kg)    GENERAL:alert, no distress and comfortable.  She looks debilitated, sitting on the wheelchair with oxygen delivered via nasal cannula  sKIN: skin color, texture, turgor are normal, no rashes or significant lesions EYES:  normal, [name], sclera clear OROPHARYNX:no exudate, no erythema and lips, buccal mucosa, and tongue normal  NECK: supple, thyroid normal size, non-tender, without nodularity LYMPH:  no palpable lymphadenopathy in the cervical, axillary or inguinal LUNGS: clear to auscultation and percussion with normal breathing effort HEART: regular rate & rhythm with loud ejection systolic murmur and no lower extremity edema ABDOMEN:abdomen soft, non-tender and normal bowel sounds Musculoskeletal:no cyanosis of digits and no clubbing  NEURO: alert & oriented x 3 with fluent speech, no focal motor/sensory deficits  LABORATORY DATA:  I have reviewed the data as listed     Component Value Date/Time   NA 134 (L) 02/07/2017 0350   NA 139 03/23/2016 1448   K 4.7 02/07/2017 0350   K 4.0 03/23/2016 1448   CL 106 02/07/2017 0350   CO2 22 02/07/2017 0350   CO2 23 03/23/2016 1448   GLUCOSE 78 02/07/2017 0350   GLUCOSE 94 03/23/2016 1448   BUN 15 02/07/2017 0350   BUN 28.5 (H) 03/23/2016 1448   CREATININE 1.05 (H) 02/07/2017 0350   CREATININE 1.4 (H) 03/23/2016 1448   CALCIUM 8.0 (L) 02/07/2017 0350   CALCIUM 10.7 (H) 03/23/2016 1448   PROT 6.3 (  L) 02/02/2017 2330   PROT 6.6 03/23/2016 1448   ALBUMIN 3.3 (L) 02/02/2017 2330   ALBUMIN 3.7 03/23/2016 1448   AST 15 02/02/2017 2330   AST 16 03/23/2016 1448   ALT 9 (L) 02/02/2017 2330   ALT 10 03/23/2016 1448   ALKPHOS 86 02/02/2017 2330   ALKPHOS 76 03/23/2016 1448   BILITOT 0.3 02/02/2017 2330   BILITOT 0.29 03/23/2016 1448   GFRNONAA 47 (L) 02/07/2017 0350   GFRAA 54 (L) 02/07/2017 0350    No results found for: SPEP, UPEP  Lab Results  Component Value Date   WBC 5.2 08/05/2017   NEUTROABS 3.9 08/05/2017   HGB 11.5 (L) 08/05/2017   HCT 36.1 08/05/2017   MCV 95.8 08/05/2017   PLT 188 08/05/2017      Chemistry      Component Value Date/Time   NA 134 (L) 02/07/2017 0350   NA 139 03/23/2016 1448   K 4.7  02/07/2017 0350   K 4.0 03/23/2016 1448   CL 106 02/07/2017 0350   CO2 22 02/07/2017 0350   CO2 23 03/23/2016 1448   BUN 15 02/07/2017 0350   BUN 28.5 (H) 03/23/2016 1448   CREATININE 1.05 (H) 02/07/2017 0350   CREATININE 1.4 (H) 03/23/2016 1448      Component Value Date/Time   CALCIUM 8.0 (L) 02/07/2017 0350   CALCIUM 10.7 (H) 03/23/2016 1448   ALKPHOS 86 02/02/2017 2330   ALKPHOS 76 03/23/2016 1448   AST 15 02/02/2017 2330   AST 16 03/23/2016 1448   ALT 9 (L) 02/02/2017 2330   ALT 10 03/23/2016 1448   BILITOT 0.3 02/02/2017 2330   BILITOT 0.29 03/23/2016 1448      I spent 15 minutes counseling the patient face to face. The total time spent in the appointment was 20 minutes and more than 50% was on counseling.   All questions were answered. The patient knows to call the clinic with any problems, questions or concerns. No barriers to learning was detected.    [name], MD 7/1/20193:10 PM

## 2017-08-05 NOTE — Patient Instructions (Signed)

## 2017-08-05 NOTE — Assessment & Plan Note (Signed)
She has chronic respiratory failure and oxygen dependent She has been prescribed long-term oxygen treatment She is debilitated  She will continue oxygen therapy as prescribed

## 2017-08-05 NOTE — Assessment & Plan Note (Signed)
There is association between aortic stenosis and GI bleed. 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 Given her age, surgery is probably not recommended.  Continue medical management

## 2017-08-05 NOTE — Assessment & Plan Note (Signed)
The patient likely had intermittent GI bleed She is symptomatic with fatigue I would proceed with intravenous iron infusion today She does not need blood transfusion The patient is getting more debilitated requiring oxygen therapy and wheelchair I recommend close follow-up/monitoring at home and we will try to see if advanced home care service can draw blood and proceed with intravenous iron infusion at home She agree with the plan of care

## 2017-08-06 ENCOUNTER — Telehealth: Payer: Self-pay

## 2017-08-06 NOTE — Telephone Encounter (Signed)
Called son, per Dr. Alvy Bimler. [facility] was unable to get approval for IV iron from insurance company. Dr. Alvy Bimler is sending scheduling message for labs and treatment. He verbalized understanding.

## 2017-08-09 ENCOUNTER — Telehealth: Payer: Self-pay | Admitting: Hematology and Oncology

## 2017-08-09 NOTE — Telephone Encounter (Signed)
Mailed patient calendar of upcoming appts per 7/2 sch message

## 2017-08-12 ENCOUNTER — Ambulatory Visit: Payer: Medicare Other

## 2017-08-12 ENCOUNTER — Ambulatory Visit: Payer: Medicare Other | Admitting: Hematology and Oncology

## 2017-08-12 ENCOUNTER — Other Ambulatory Visit: Payer: Medicare Other

## 2017-08-22 DIAGNOSIS — F419 Anxiety disorder, unspecified: Secondary | ICD-10-CM | POA: Diagnosis not present

## 2017-08-22 DIAGNOSIS — Z9981 Dependence on supplemental oxygen: Secondary | ICD-10-CM | POA: Diagnosis not present

## 2017-08-22 DIAGNOSIS — D509 Iron deficiency anemia, unspecified: Secondary | ICD-10-CM | POA: Diagnosis not present

## 2017-08-22 DIAGNOSIS — M810 Age-related osteoporosis without current pathological fracture: Secondary | ICD-10-CM | POA: Diagnosis not present

## 2017-08-22 DIAGNOSIS — Z0000 Encounter for general adult medical examination without abnormal findings: Secondary | ICD-10-CM | POA: Diagnosis not present

## 2017-08-22 DIAGNOSIS — K219 Gastro-esophageal reflux disease without esophagitis: Secondary | ICD-10-CM | POA: Diagnosis not present

## 2017-08-22 DIAGNOSIS — J3489 Other specified disorders of nose and nasal sinuses: Secondary | ICD-10-CM | POA: Diagnosis not present

## 2017-08-22 DIAGNOSIS — E559 Vitamin D deficiency, unspecified: Secondary | ICD-10-CM | POA: Diagnosis not present

## 2017-09-16 ENCOUNTER — Inpatient Hospital Stay: Payer: Medicare Other

## 2017-09-16 ENCOUNTER — Inpatient Hospital Stay: Payer: Medicare Other | Attending: Hematology and Oncology

## 2017-09-16 VITALS — BP 160/75 | HR 78 | Temp 98.3°F | Resp 18

## 2017-09-16 DIAGNOSIS — K31811 Angiodysplasia of stomach and duodenum with bleeding: Secondary | ICD-10-CM | POA: Insufficient documentation

## 2017-09-16 DIAGNOSIS — D539 Nutritional anemia, unspecified: Secondary | ICD-10-CM

## 2017-09-16 DIAGNOSIS — D509 Iron deficiency anemia, unspecified: Secondary | ICD-10-CM

## 2017-09-16 DIAGNOSIS — D500 Iron deficiency anemia secondary to blood loss (chronic): Secondary | ICD-10-CM | POA: Insufficient documentation

## 2017-09-16 DIAGNOSIS — K31819 Angiodysplasia of stomach and duodenum without bleeding: Secondary | ICD-10-CM

## 2017-09-16 DIAGNOSIS — D508 Other iron deficiency anemias: Secondary | ICD-10-CM

## 2017-09-16 LAB — CBC WITH DIFFERENTIAL/PLATELET
BASOS ABS: 0 10*3/uL (ref 0.0–0.1)
BASOS PCT: 0 %
Eosinophils Absolute: 0.1 10*3/uL (ref 0.0–0.5)
Eosinophils Relative: 1 %
HEMATOCRIT: 37 % (ref 34.8–46.6)
HEMOGLOBIN: 11.9 g/dL (ref 11.6–15.9)
Lymphocytes Relative: 9 %
Lymphs Abs: 0.5 10*3/uL — ABNORMAL LOW (ref 0.9–3.3)
MCH: 31.4 pg (ref 25.1–34.0)
MCHC: 32.2 g/dL (ref 31.5–36.0)
MCV: 97.6 fL (ref 79.5–101.0)
Monocytes Absolute: 0.3 10*3/uL (ref 0.1–0.9)
Monocytes Relative: 6 %
NEUTROS ABS: 4.4 10*3/uL (ref 1.5–6.5)
NEUTROS PCT: 84 %
Platelets: 152 10*3/uL (ref 145–400)
RBC: 3.79 MIL/uL (ref 3.70–5.45)
RDW: 13.6 % (ref 11.2–14.5)
WBC: 5.3 10*3/uL (ref 3.9–10.3)

## 2017-09-16 LAB — FERRITIN: FERRITIN: 367 ng/mL — AB (ref 11–307)

## 2017-09-16 LAB — SAMPLE TO BLOOD BANK

## 2017-09-16 MED ORDER — SODIUM CHLORIDE 0.9 % IV SOLN
INTRAVENOUS | Status: DC
  Administered 2017-09-16: 15:00:00 via INTRAVENOUS
  Filled 2017-09-16: qty 250

## 2017-09-16 MED ORDER — FERUMOXYTOL INJECTION 510 MG/17 ML
510.0000 mg | Freq: Once | INTRAVENOUS | Status: AC
  Administered 2017-09-16: 510 mg via INTRAVENOUS
  Filled 2017-09-16: qty 17

## 2017-09-16 NOTE — Patient Instructions (Signed)

## 2017-10-25 ENCOUNTER — Telehealth: Payer: Self-pay

## 2017-10-25 ENCOUNTER — Other Ambulatory Visit: Payer: Self-pay

## 2017-10-25 DIAGNOSIS — D539 Nutritional anemia, unspecified: Secondary | ICD-10-CM

## 2017-10-25 NOTE — Telephone Encounter (Signed)
He called and left a message to call him. He is asking if his Mom can get a flu shot Monday with her appt.  Called back. Told him we have flu shots and his Mom can get one Monday with her appt.

## 2017-10-28 ENCOUNTER — Inpatient Hospital Stay: Payer: Medicare Other

## 2017-10-28 ENCOUNTER — Inpatient Hospital Stay: Payer: Medicare Other | Attending: Hematology and Oncology

## 2017-10-28 ENCOUNTER — Other Ambulatory Visit: Payer: Self-pay | Admitting: Hematology and Oncology

## 2017-10-28 DIAGNOSIS — Z23 Encounter for immunization: Secondary | ICD-10-CM | POA: Insufficient documentation

## 2017-10-28 DIAGNOSIS — D539 Nutritional anemia, unspecified: Secondary | ICD-10-CM

## 2017-10-28 DIAGNOSIS — N183 Chronic kidney disease, stage 3 unspecified: Secondary | ICD-10-CM

## 2017-10-28 DIAGNOSIS — J9611 Chronic respiratory failure with hypoxia: Secondary | ICD-10-CM

## 2017-10-28 DIAGNOSIS — D500 Iron deficiency anemia secondary to blood loss (chronic): Secondary | ICD-10-CM | POA: Diagnosis not present

## 2017-10-28 DIAGNOSIS — K31819 Angiodysplasia of stomach and duodenum without bleeding: Secondary | ICD-10-CM

## 2017-10-28 LAB — CBC WITH DIFFERENTIAL (CANCER CENTER ONLY)
BASOS ABS: 0 10*3/uL (ref 0.0–0.1)
Basophils Relative: 0 %
Eosinophils Absolute: 0.1 10*3/uL (ref 0.0–0.5)
Eosinophils Relative: 2 %
HEMATOCRIT: 36.5 % (ref 34.8–46.6)
HEMOGLOBIN: 11.6 g/dL (ref 11.6–15.9)
Lymphocytes Relative: 15 %
Lymphs Abs: 0.7 10*3/uL — ABNORMAL LOW (ref 0.9–3.3)
MCH: 31.3 pg (ref 25.1–34.0)
MCHC: 31.8 g/dL (ref 31.5–36.0)
MCV: 98.4 fL (ref 79.5–101.0)
Monocytes Absolute: 0.3 10*3/uL (ref 0.1–0.9)
Monocytes Relative: 8 %
NEUTROS ABS: 3.3 10*3/uL (ref 1.5–6.5)
NEUTROS PCT: 75 %
Platelet Count: 167 10*3/uL (ref 145–400)
RBC: 3.71 MIL/uL (ref 3.70–5.45)
RDW: 14.2 % (ref 11.2–14.5)
WBC: 4.3 10*3/uL (ref 3.9–10.3)

## 2017-10-28 LAB — FERRITIN: FERRITIN: 443 ng/mL — AB (ref 11–307)

## 2017-10-28 LAB — SAMPLE TO BLOOD BANK

## 2017-10-28 MED ORDER — INFLUENZA VAC SPLIT HIGH-DOSE 0.5 ML IM SUSY
0.5000 mL | PREFILLED_SYRINGE | Freq: Once | INTRAMUSCULAR | Status: AC
  Administered 2017-10-28: 0.5 mL via INTRAMUSCULAR
  Filled 2017-10-28: qty 0.5

## 2017-10-28 NOTE — Patient Instructions (Signed)

## 2017-12-09 ENCOUNTER — Inpatient Hospital Stay: Payer: Medicare Other

## 2017-12-09 ENCOUNTER — Inpatient Hospital Stay: Payer: Medicare Other | Attending: Hematology and Oncology

## 2017-12-09 VITALS — BP 143/69 | HR 67 | Temp 98.2°F | Resp 16

## 2017-12-09 DIAGNOSIS — D500 Iron deficiency anemia secondary to blood loss (chronic): Secondary | ICD-10-CM | POA: Insufficient documentation

## 2017-12-09 DIAGNOSIS — D539 Nutritional anemia, unspecified: Secondary | ICD-10-CM

## 2017-12-09 DIAGNOSIS — D508 Other iron deficiency anemias: Secondary | ICD-10-CM

## 2017-12-09 DIAGNOSIS — K31811 Angiodysplasia of stomach and duodenum with bleeding: Secondary | ICD-10-CM | POA: Insufficient documentation

## 2017-12-09 DIAGNOSIS — K31819 Angiodysplasia of stomach and duodenum without bleeding: Secondary | ICD-10-CM

## 2017-12-09 LAB — CBC WITH DIFFERENTIAL/PLATELET
ABS IMMATURE GRANULOCYTES: 0.01 10*3/uL (ref 0.00–0.07)
BASOS ABS: 0 10*3/uL (ref 0.0–0.1)
Basophils Relative: 0 %
Eosinophils Absolute: 0.1 10*3/uL (ref 0.0–0.5)
Eosinophils Relative: 2 %
HCT: 33 % — ABNORMAL LOW (ref 36.0–46.0)
HEMOGLOBIN: 10.4 g/dL — AB (ref 12.0–15.0)
Immature Granulocytes: 0 %
LYMPHS PCT: 12 %
Lymphs Abs: 0.5 10*3/uL — ABNORMAL LOW (ref 0.7–4.0)
MCH: 31.5 pg (ref 26.0–34.0)
MCHC: 31.5 g/dL (ref 30.0–36.0)
MCV: 100 fL (ref 80.0–100.0)
MONO ABS: 0.3 10*3/uL (ref 0.1–1.0)
Monocytes Relative: 8 %
NEUTROS ABS: 3.2 10*3/uL (ref 1.7–7.7)
Neutrophils Relative %: 78 %
Platelets: 155 10*3/uL (ref 150–400)
RBC: 3.3 MIL/uL — ABNORMAL LOW (ref 3.87–5.11)
RDW: 13.2 % (ref 11.5–15.5)
WBC: 4.1 10*3/uL (ref 4.0–10.5)
nRBC: 0 % (ref 0.0–0.2)

## 2017-12-09 LAB — SAMPLE TO BLOOD BANK

## 2017-12-09 LAB — FERRITIN: FERRITIN: 382 ng/mL — AB (ref 11–307)

## 2017-12-09 MED ORDER — SODIUM CHLORIDE 0.9 % IV SOLN
INTRAVENOUS | Status: DC
  Administered 2017-12-09: 15:00:00 via INTRAVENOUS
  Filled 2017-12-09 (×2): qty 250

## 2017-12-09 MED ORDER — SODIUM CHLORIDE 0.9 % IV SOLN
510.0000 mg | Freq: Once | INTRAVENOUS | Status: AC
  Administered 2017-12-09: 510 mg via INTRAVENOUS
  Filled 2017-12-09: qty 17

## 2017-12-09 NOTE — Patient Instructions (Signed)

## 2017-12-09 NOTE — Progress Notes (Signed)
Reviewed MD note to determine parameters for feraheme. Unable to find concrete parameters. Per desk RN, tx is based on MD discretion. Per last OV note, iron replacement d/t GI bleeding. Discussed with pharmacy. Per Burman Nieves, [facility], concern if ferritin is in the 500's. Last ferritin 10/28/17 was 443. Through conversation with pt and son upon arrival, noted that concern is mostly regarding Hgb, even if ferritin is elevated. Pt did not receive IV iron last month d/t Hgb 11.6. Today, Hgb 10.4. Discussed with on-call MD, Dr. Audelia Hives, who recommended to go ahead with tx based on information above, labs, and past OV notes from Dr. Alvy Bimler. Pt VSS.

## 2017-12-10 ENCOUNTER — Other Ambulatory Visit: Payer: Self-pay | Admitting: Gastroenterology

## 2018-01-20 ENCOUNTER — Telehealth: Payer: Self-pay

## 2018-01-20 ENCOUNTER — Inpatient Hospital Stay: Payer: Medicare Other | Attending: Hematology and Oncology

## 2018-01-20 ENCOUNTER — Inpatient Hospital Stay: Payer: Medicare Other

## 2018-01-20 ENCOUNTER — Telehealth: Payer: Self-pay | Admitting: Hematology and Oncology

## 2018-01-20 VITALS — BP 108/59 | HR 70

## 2018-01-20 DIAGNOSIS — D500 Iron deficiency anemia secondary to blood loss (chronic): Secondary | ICD-10-CM | POA: Diagnosis not present

## 2018-01-20 DIAGNOSIS — D508 Other iron deficiency anemias: Secondary | ICD-10-CM

## 2018-01-20 DIAGNOSIS — K31819 Angiodysplasia of stomach and duodenum without bleeding: Secondary | ICD-10-CM

## 2018-01-20 DIAGNOSIS — K31811 Angiodysplasia of stomach and duodenum with bleeding: Secondary | ICD-10-CM | POA: Insufficient documentation

## 2018-01-20 DIAGNOSIS — D539 Nutritional anemia, unspecified: Secondary | ICD-10-CM

## 2018-01-20 LAB — CBC WITH DIFFERENTIAL/PLATELET
ABS IMMATURE GRANULOCYTES: 0 10*3/uL (ref 0.00–0.07)
Basophils Absolute: 0 10*3/uL (ref 0.0–0.1)
Basophils Relative: 0 %
EOS PCT: 2 %
Eosinophils Absolute: 0.1 10*3/uL (ref 0.0–0.5)
HEMATOCRIT: 34.6 % — AB (ref 36.0–46.0)
HEMOGLOBIN: 11.3 g/dL — AB (ref 12.0–15.0)
Immature Granulocytes: 0 %
LYMPHS ABS: 0.7 10*3/uL (ref 0.7–4.0)
LYMPHS PCT: 17 %
MCH: 31.7 pg (ref 26.0–34.0)
MCHC: 32.7 g/dL (ref 30.0–36.0)
MCV: 97.2 fL (ref 80.0–100.0)
Monocytes Absolute: 0.3 10*3/uL (ref 0.1–1.0)
Monocytes Relative: 8 %
NEUTROS ABS: 3.1 10*3/uL (ref 1.7–7.7)
Neutrophils Relative %: 73 %
Platelets: 163 10*3/uL (ref 150–400)
RBC: 3.56 MIL/uL — AB (ref 3.87–5.11)
RDW: 12.6 % (ref 11.5–15.5)
WBC: 4.1 10*3/uL (ref 4.0–10.5)
nRBC: 0 % (ref 0.0–0.2)

## 2018-01-20 LAB — FERRITIN: Ferritin: 320 ng/mL — ABNORMAL HIGH (ref 11–307)

## 2018-01-20 MED ORDER — SODIUM CHLORIDE 0.9 % IV SOLN
Freq: Once | INTRAVENOUS | Status: AC
  Administered 2018-01-20: 15:00:00 via INTRAVENOUS
  Filled 2018-01-20: qty 250

## 2018-01-20 MED ORDER — SODIUM CHLORIDE 0.9 % IV SOLN
510.0000 mg | Freq: Once | INTRAVENOUS | Status: AC
  Administered 2018-01-20: 510 mg via INTRAVENOUS
  Filled 2018-01-20: qty 17

## 2018-01-20 NOTE — Patient Instructions (Signed)

## 2018-01-20 NOTE — Telephone Encounter (Signed)
Talked with son in lobby, per Dr. Alvy Bimler to see if it would be okay to move 1/6 appts to the last week in January. He said that would be good. Scheduling message sent.

## 2018-01-20 NOTE — Telephone Encounter (Signed)
Scheduled appt per 12/16 sch message - left message for son with appt date and time

## 2018-02-10 ENCOUNTER — Ambulatory Visit: Payer: Medicare Other | Admitting: Hematology and Oncology

## 2018-02-10 ENCOUNTER — Other Ambulatory Visit: Payer: Medicare Other

## 2018-03-03 ENCOUNTER — Inpatient Hospital Stay: Payer: Medicare Other

## 2018-03-03 ENCOUNTER — Encounter: Payer: Self-pay | Admitting: Hematology and Oncology

## 2018-03-03 ENCOUNTER — Inpatient Hospital Stay: Payer: Medicare Other | Attending: Hematology and Oncology

## 2018-03-03 ENCOUNTER — Inpatient Hospital Stay (HOSPITAL_BASED_OUTPATIENT_CLINIC_OR_DEPARTMENT_OTHER): Payer: Medicare Other | Admitting: Hematology and Oncology

## 2018-03-03 DIAGNOSIS — I509 Heart failure, unspecified: Secondary | ICD-10-CM | POA: Diagnosis not present

## 2018-03-03 DIAGNOSIS — J9611 Chronic respiratory failure with hypoxia: Secondary | ICD-10-CM

## 2018-03-03 DIAGNOSIS — D500 Iron deficiency anemia secondary to blood loss (chronic): Secondary | ICD-10-CM | POA: Insufficient documentation

## 2018-03-03 DIAGNOSIS — K922 Gastrointestinal hemorrhage, unspecified: Secondary | ICD-10-CM | POA: Insufficient documentation

## 2018-03-03 DIAGNOSIS — I350 Nonrheumatic aortic (valve) stenosis: Secondary | ICD-10-CM

## 2018-03-03 DIAGNOSIS — Z79899 Other long term (current) drug therapy: Secondary | ICD-10-CM | POA: Insufficient documentation

## 2018-03-03 DIAGNOSIS — K31819 Angiodysplasia of stomach and duodenum without bleeding: Secondary | ICD-10-CM

## 2018-03-03 DIAGNOSIS — D539 Nutritional anemia, unspecified: Secondary | ICD-10-CM

## 2018-03-03 DIAGNOSIS — D508 Other iron deficiency anemias: Secondary | ICD-10-CM

## 2018-03-03 LAB — CBC WITH DIFFERENTIAL/PLATELET
ABS IMMATURE GRANULOCYTES: 0.01 10*3/uL (ref 0.00–0.07)
BASOS ABS: 0 10*3/uL (ref 0.0–0.1)
Basophils Relative: 0 %
Eosinophils Absolute: 0.1 10*3/uL (ref 0.0–0.5)
Eosinophils Relative: 1 %
HCT: 37 % (ref 36.0–46.0)
Hemoglobin: 11.9 g/dL — ABNORMAL LOW (ref 12.0–15.0)
IMMATURE GRANULOCYTES: 0 %
Lymphocytes Relative: 13 %
Lymphs Abs: 0.7 10*3/uL (ref 0.7–4.0)
MCH: 31.6 pg (ref 26.0–34.0)
MCHC: 32.2 g/dL (ref 30.0–36.0)
MCV: 98.4 fL (ref 80.0–100.0)
Monocytes Absolute: 0.3 10*3/uL (ref 0.1–1.0)
Monocytes Relative: 6 %
NEUTROS ABS: 4.3 10*3/uL (ref 1.7–7.7)
Neutrophils Relative %: 80 %
Platelets: 173 10*3/uL (ref 150–400)
RBC: 3.76 MIL/uL — ABNORMAL LOW (ref 3.87–5.11)
RDW: 13.2 % (ref 11.5–15.5)
WBC: 5.4 10*3/uL (ref 4.0–10.5)
nRBC: 0 % (ref 0.0–0.2)

## 2018-03-03 MED ORDER — SODIUM CHLORIDE 0.9 % IV SOLN
510.0000 mg | Freq: Once | INTRAVENOUS | Status: AC
  Administered 2018-03-03: 510 mg via INTRAVENOUS
  Filled 2018-03-03: qty 17

## 2018-03-03 MED ORDER — SODIUM CHLORIDE 0.9 % IV SOLN
INTRAVENOUS | Status: DC
  Administered 2018-03-03: 16:00:00 via INTRAVENOUS
  Filled 2018-03-03: qty 250

## 2018-03-03 NOTE — Assessment & Plan Note (Signed)
There is association between aortic stenosis and GI bleed. 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 Given her age, surgery is probably not recommended.  Continue medical management

## 2018-03-03 NOTE — Assessment & Plan Note (Signed)
She has chronic respiratory failure and oxygen dependent She has been prescribed long-term oxygen treatment She is debilitated  She will continue oxygen therapy as prescribed

## 2018-03-03 NOTE — Patient Instructions (Signed)

## 2018-03-03 NOTE — Progress Notes (Signed)
Sophia [name]  Sophia Pretty, MD  ASSESSMENT & PLAN:  Iron deficiency anemia due to chronic blood loss The patient likely had intermittent GI bleed She is symptomatic with fatigue, multifactorial due to possible chronic GI bleed, aortic stenosis and congestive heart failure I would proceed with intravenous iron infusion today She does not need blood transfusion We do not plan to wait for ferritin level before each dose of intravenous iron We will treat regardless of ferritin level but will consider holding iron infusion if she is not anemic  Aortic stenosis There is association between aortic stenosis and GI bleed. 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 Given her age, surgery is probably not recommended.  Continue medical management   Respiratory failure, chronic ([place]) She has chronic respiratory failure and oxygen dependent She has been prescribed long-term oxygen treatment She is debilitated  She will continue oxygen therapy as prescribed   No orders of the defined types were placed in this encounter.   INTERVAL HISTORY: Sophia [name] 85 y.o. female returns for further intravenous iron infusion She tolerated IV iron well She continues to have chronic fatigue and on oxygen therapy for aortic stenosis of and congestive heart failure The patient denies any recent signs or symptoms of bleeding such as spontaneous epistaxis, hematuria or hematochezia. She denies infusion reaction to intravenous iron   SUMMARY OF HEMATOLOGIC HISTORY:  She was found to have abnormal CBC from recent blood count monitoring. The patient have chronic iron deficiency anemia due to recurrent GI bleed. 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 On 09/30/2013, bone marrow biopsy was done which showed no evidence to suggest myelodysplastic syndrome From July 2015 to present, she has received numerous intravenous iron  infusion On 07/08/2014, repeat endoscopy revealed multiple AVMs and GAVE and she have repeat ablation therapy She was admitted to the hospital in December 2017 due to severe anemia. She received blood transfusion and iron treatment Since February 2018, she received intravenous iron every 4- 6 weeks   I have reviewed the past medical history, past surgical history, social history and family history with the patient and they are unchanged from previous [name].  ALLERGIES:  is allergic to nutritional supplements; risedronate sodium; fosamax [alendronate sodium]; lipitor [atorvastatin]; pregabalin; tolterodine tartrate; and wasp venom.  MEDICATIONS:  Current Outpatient Medications  Medication Sig Dispense Refill  . acetaminophen (TYLENOL) 500 MG tablet Take 500 mg by mouth 2 (two) times daily as needed for mild pain or fever.    [name] albuterol (PROVENTIL HFA;VENTOLIN HFA) 108 (90 Base) MCG/ACT inhaler Inhale 1-2 puffs into the lungs every 6 (six) hours as needed for wheezing or shortness of breath. 1 Inhaler 0  . ALPRAZolam (XANAX) 0.25 MG tablet Take 1 tablet (0.25 mg total) by mouth 2 (two) times daily as needed for anxiety. 30 tablet 0  . Calcium Carb-Cholecalciferol (CALCIUM-VITAMIN D) 500-200 MG-UNIT tablet Take by mouth.    . cholecalciferol (VITAMIN D) 1000 units tablet Take 2,000 Units by mouth daily.    [name] denosumab (PROLIA) 60 MG/ML SOLN Inject 60 mg into the skin every 6 (six) months.     . DEXILANT 60 MG capsule TAKE 1 TABLET BY MOUTH DAILY EVERY MORNING. 90 capsule 2  . EPINEPHrine (EPIPEN 2-PAK) 0.3 mg/0.3 mL IJ SOAJ injection Inject 0.3 mg into the muscle once as needed (for severe allergic reaction).    . fluorometholone (FML) 0.1 % ophthalmic suspension Place 1 drop into  the left eye daily.     . folic acid (FOLVITE) 528 MCG tablet Take 800 mcg by mouth daily.    [name] gabapentin (NEURONTIN) 400 MG capsule Take 2 capsules (800 mg total) by mouth 2 (two) times daily. 60 capsule 0  .  hydroxypropyl methylcellulose / hypromellose (ISOPTO TEARS / GONIOVISC) 2.5 % ophthalmic solution Place 1-2 drops into the right eye as needed for dry eyes.     [name] sertraline (ZOLOFT) 100 MG tablet Take 100 mg by mouth daily.     . verapamil (CALAN-SR) 240 MG CR tablet Take 240 mg by mouth daily.     No current facility-administered medications for this visit.    Facility-Administered Medications Ordered in Other Visits  Medication Dose Route Frequency Provider Last Rate Last Dose  . 0.9 %  sodium chloride infusion   Intravenous Continuous Alvy Bimler, Yaziel Brandon, MD         REVIEW OF SYSTEMS:   Constitutional: Denies fevers, [name], nose, mouth, throat, [name], [name], [name], [name], [name], no new changes  All other systems were reviewed with the patient and are negative.  PHYSICAL EXAMINATION: ECOG PERFORMANCE STATUS: 2 - Symptomatic, <50% confined to bed  Vitals:   03/03/18 1526  BP: (!) 117/49  Pulse: 75  Resp: 17  Temp: 97.7 F (36.5 C)  SpO2: 96%   There were no vitals filed for this visit.  GENERAL:alert, no distress and comfortable.  She appears debilitated.  She is sitting on the wheelchair, with oxygen delivered via nasal cannula SKIN: skin color, texture, turgor are normal, [name], [name], sclera clear OROPHARYNX:no exudate, no erythema and lips, buccal mucosa, and tongue normal  NECK: supple, thyroid normal size, non-tender, without nodularity LYMPH:  no palpable  lymphadenopathy in the cervical, axillary or inguinal LUNGS: clear to auscultation and percussion with normal breathing effort HEART: regular rate & rhythm with soft ejection systolic murmur and no lower extremity edema ABDOMEN:abdomen soft, non-tender and normal bowel sounds Musculoskeletal:no cyanosis of digits and no clubbing  NEURO: alert & oriented x 3 with fluent speech, no focal motor/sensory deficits  LABORATORY DATA:  I have reviewed the data as listed     Component Value Date/Time   NA 134 (L) 02/07/2017 0350   NA 139 03/23/2016 1448   K 4.7 02/07/2017 0350   K 4.0 03/23/2016 1448   CL 106 02/07/2017 0350   CO2 22 02/07/2017 0350   CO2 23 03/23/2016 1448   GLUCOSE 78 02/07/2017 0350   GLUCOSE 94 03/23/2016 1448   BUN 15 02/07/2017 0350   BUN 28.5 (H) 03/23/2016 1448   CREATININE 1.05 (H) 02/07/2017 0350   CREATININE 1.4 (H) 03/23/2016 1448   CALCIUM 8.0 (L) 02/07/2017 0350   CALCIUM 10.7 (H) 03/23/2016 1448   PROT 6.3 (L) 02/02/2017 2330   PROT 6.6 03/23/2016 1448   ALBUMIN 3.3 (L) 02/02/2017 2330   ALBUMIN 3.7 03/23/2016 1448   AST 15 02/02/2017 2330   AST 16 03/23/2016 1448   ALT 9 (L) 02/02/2017 2330   ALT 10 03/23/2016 1448   ALKPHOS 86 02/02/2017 2330   ALKPHOS 76  03/23/2016 1448   BILITOT 0.3 02/02/2017 2330   BILITOT 0.29 03/23/2016 1448   GFRNONAA 47 (L) 02/07/2017 0350   GFRAA 54 (L) 02/07/2017 0350    No results found for: SPEP, UPEP  Lab Results  Component Value Date   WBC 5.4 03/03/2018   NEUTROABS 4.3 03/03/2018   HGB 11.9 (L) 03/03/2018   HCT 37.0 03/03/2018   MCV 98.4 03/03/2018   PLT 173 03/03/2018      Chemistry      Component Value Date/Time   NA 134 (L) 02/07/2017 0350   NA 139 03/23/2016 1448   K 4.7 02/07/2017 0350   K 4.0 03/23/2016 1448   CL 106 02/07/2017 0350   CO2 22 02/07/2017 0350   CO2 23 03/23/2016 1448   BUN 15 02/07/2017 0350   BUN 28.5 (H) 03/23/2016 1448   CREATININE 1.05 (H) 02/07/2017 0350   CREATININE  1.4 (H) 03/23/2016 1448      Component Value Date/Time   CALCIUM 8.0 (L) 02/07/2017 0350   CALCIUM 10.7 (H) 03/23/2016 1448   ALKPHOS 86 02/02/2017 2330   ALKPHOS 76 03/23/2016 1448   AST 15 02/02/2017 2330   AST 16 03/23/2016 1448   ALT 9 (L) 02/02/2017 2330   ALT 10 03/23/2016 1448   BILITOT 0.3 02/02/2017 2330   BILITOT 0.29 03/23/2016 1448       I spent 15 minutes counseling the patient face to face. The total time spent in the appointment was 20 minutes and more than 50% was on counseling.   All questions were answered. The patient knows to call the clinic with any problems, questions or concerns. No barriers to learning was detected.    [name], MD 1/27/20203:56 PM

## 2018-03-03 NOTE — Assessment & Plan Note (Signed)
The patient likely had intermittent GI bleed She is symptomatic with fatigue, multifactorial due to possible chronic GI bleed, aortic stenosis and congestive heart failure I would proceed with intravenous iron infusion today She does not need blood transfusion We do not plan to wait for ferritin level before each dose of intravenous iron We will treat regardless of ferritin level but will consider holding iron infusion if she is not anemic

## 2018-03-04 LAB — FERRITIN: Ferritin: 269 ng/mL (ref 11–307)

## 2018-03-25 DIAGNOSIS — K219 Gastro-esophageal reflux disease without esophagitis: Secondary | ICD-10-CM | POA: Diagnosis not present

## 2018-03-25 DIAGNOSIS — Z9981 Dependence on supplemental oxygen: Secondary | ICD-10-CM | POA: Diagnosis not present

## 2018-03-25 DIAGNOSIS — E611 Iron deficiency: Secondary | ICD-10-CM | POA: Diagnosis not present

## 2018-03-25 DIAGNOSIS — D649 Anemia, unspecified: Secondary | ICD-10-CM | POA: Diagnosis not present

## 2018-03-25 DIAGNOSIS — F419 Anxiety disorder, unspecified: Secondary | ICD-10-CM | POA: Diagnosis not present

## 2018-03-25 DIAGNOSIS — M810 Age-related osteoporosis without current pathological fracture: Secondary | ICD-10-CM | POA: Diagnosis not present

## 2018-03-25 DIAGNOSIS — J9611 Chronic respiratory failure with hypoxia: Secondary | ICD-10-CM | POA: Diagnosis not present

## 2018-04-15 ENCOUNTER — Inpatient Hospital Stay: Payer: Medicare Other

## 2018-04-15 ENCOUNTER — Inpatient Hospital Stay: Payer: Medicare Other | Attending: Hematology and Oncology

## 2018-04-15 DIAGNOSIS — K922 Gastrointestinal hemorrhage, unspecified: Secondary | ICD-10-CM | POA: Diagnosis not present

## 2018-04-15 DIAGNOSIS — D500 Iron deficiency anemia secondary to blood loss (chronic): Secondary | ICD-10-CM | POA: Diagnosis not present

## 2018-04-15 DIAGNOSIS — K31819 Angiodysplasia of stomach and duodenum without bleeding: Secondary | ICD-10-CM

## 2018-04-15 LAB — CBC WITH DIFFERENTIAL/PLATELET
Abs Immature Granulocytes: 0.01 10*3/uL (ref 0.00–0.07)
Basophils Absolute: 0 10*3/uL (ref 0.0–0.1)
Basophils Relative: 0 %
EOS PCT: 1 %
Eosinophils Absolute: 0 10*3/uL (ref 0.0–0.5)
HCT: 37.2 % (ref 36.0–46.0)
Hemoglobin: 12 g/dL (ref 12.0–15.0)
Immature Granulocytes: 0 %
Lymphocytes Relative: 14 %
Lymphs Abs: 0.7 10*3/uL (ref 0.7–4.0)
MCH: 31.1 pg (ref 26.0–34.0)
MCHC: 32.3 g/dL (ref 30.0–36.0)
MCV: 96.4 fL (ref 80.0–100.0)
Monocytes Absolute: 0.4 10*3/uL (ref 0.1–1.0)
Monocytes Relative: 8 %
Neutro Abs: 3.7 10*3/uL (ref 1.7–7.7)
Neutrophils Relative %: 77 %
Platelets: 160 10*3/uL (ref 150–400)
RBC: 3.86 MIL/uL — ABNORMAL LOW (ref 3.87–5.11)
RDW: 12.9 % (ref 11.5–15.5)
WBC: 4.8 10*3/uL (ref 4.0–10.5)
nRBC: 0 % (ref 0.0–0.2)

## 2018-04-15 LAB — FERRITIN: Ferritin: 252 ng/mL (ref 11–307)

## 2018-04-15 NOTE — Progress Notes (Signed)
Per Dr. Natale Lay, no iron infusion needed today. Hemoglobin 12.0. Patient has no complaints and per patient's son she has been "feeling good." Patient and son made aware and verbalized understanding.

## 2018-05-07 ENCOUNTER — Telehealth: Payer: Self-pay

## 2018-05-07 NOTE — Telephone Encounter (Signed)
Son called and verified upcoming appt on 4/21. Son is asking if home health could come to home and draw labs prior to appt?

## 2018-05-08 NOTE — Telephone Encounter (Signed)
Home health will not draw CBC only without other services

## 2018-05-08 NOTE — Telephone Encounter (Signed)
Called and given below message. He verbalized understanding. 

## 2018-05-27 ENCOUNTER — Ambulatory Visit: Payer: Medicare Other

## 2018-05-27 ENCOUNTER — Other Ambulatory Visit: Payer: Medicare Other

## 2018-07-08 ENCOUNTER — Other Ambulatory Visit: Payer: Self-pay

## 2018-07-08 ENCOUNTER — Inpatient Hospital Stay: Payer: Medicare Other | Attending: Hematology and Oncology

## 2018-07-08 ENCOUNTER — Inpatient Hospital Stay: Payer: Medicare Other

## 2018-07-08 VITALS — BP 153/82 | HR 73 | Temp 97.6°F | Resp 18

## 2018-07-08 DIAGNOSIS — K31819 Angiodysplasia of stomach and duodenum without bleeding: Secondary | ICD-10-CM

## 2018-07-08 DIAGNOSIS — J9610 Chronic respiratory failure, unspecified whether with hypoxia or hypercapnia: Secondary | ICD-10-CM | POA: Diagnosis not present

## 2018-07-08 DIAGNOSIS — K922 Gastrointestinal hemorrhage, unspecified: Secondary | ICD-10-CM | POA: Insufficient documentation

## 2018-07-08 DIAGNOSIS — D500 Iron deficiency anemia secondary to blood loss (chronic): Secondary | ICD-10-CM | POA: Diagnosis not present

## 2018-07-08 DIAGNOSIS — I350 Nonrheumatic aortic (valve) stenosis: Secondary | ICD-10-CM | POA: Diagnosis not present

## 2018-07-08 DIAGNOSIS — D539 Nutritional anemia, unspecified: Secondary | ICD-10-CM

## 2018-07-08 DIAGNOSIS — D508 Other iron deficiency anemias: Secondary | ICD-10-CM

## 2018-07-08 LAB — CBC WITH DIFFERENTIAL/PLATELET
Abs Immature Granulocytes: 0.02 10*3/uL (ref 0.00–0.07)
Basophils Absolute: 0 10*3/uL (ref 0.0–0.1)
Basophils Relative: 1 %
Eosinophils Absolute: 0 10*3/uL (ref 0.0–0.5)
Eosinophils Relative: 1 %
HCT: 35.9 % — ABNORMAL LOW (ref 36.0–46.0)
Hemoglobin: 11.5 g/dL — ABNORMAL LOW (ref 12.0–15.0)
Immature Granulocytes: 1 %
Lymphocytes Relative: 12 %
Lymphs Abs: 0.5 10*3/uL — ABNORMAL LOW (ref 0.7–4.0)
MCH: 31.1 pg (ref 26.0–34.0)
MCHC: 32 g/dL (ref 30.0–36.0)
MCV: 97 fL (ref 80.0–100.0)
Monocytes Absolute: 0.3 10*3/uL (ref 0.1–1.0)
Monocytes Relative: 8 %
Neutro Abs: 3.4 10*3/uL (ref 1.7–7.7)
Neutrophils Relative %: 77 %
Platelets: 168 10*3/uL (ref 150–400)
RBC: 3.7 MIL/uL — ABNORMAL LOW (ref 3.87–5.11)
RDW: 13.9 % (ref 11.5–15.5)
WBC: 4.3 10*3/uL (ref 4.0–10.5)
nRBC: 0 % (ref 0.0–0.2)

## 2018-07-08 MED ORDER — SODIUM CHLORIDE 0.9 % IV SOLN
INTRAVENOUS | Status: DC
  Administered 2018-07-08: 16:00:00 via INTRAVENOUS
  Filled 2018-07-08: qty 250

## 2018-07-08 MED ORDER — SODIUM CHLORIDE 0.9 % IV SOLN
510.0000 mg | Freq: Once | INTRAVENOUS | Status: AC
  Administered 2018-07-08: 16:00:00 510 mg via INTRAVENOUS
  Filled 2018-07-08: qty 17

## 2018-07-08 NOTE — Patient Instructions (Signed)
Soph[facility] [name] Discharge Instructions for Patients Receiving Chemotherapy  Today you received the following chemotherapy agents Feraheme  To help prevent nausea and vomiting after your treatment, we encourage you to take your nausea medication as directed.   If you develop nausea and vomiting that is not controlled by your nausea medication, call the clinic.   BELOW ARE SYMPTOMS THAT SHOULD BE REPORTED IMMEDIATELY:  *FEVER GREATER THAN 100.5 F  *CHILLS WITH OR WITHOUT FEVER  NAUSEA AND VOMITING THAT IS NOT CONTROLLED WITH YOUR NAUSEA MEDICATION  *UNUSUAL SHORTNESS OF BREATH  *UNUSUAL BRUISING OR BLEEDING  TENDERNESS IN MOUTH AND THROAT WITH OR WITHOUT PRESENCE OF ULCERS  *URINARY PROBLEMS  *BOWEL PROBLEMS  UNUSUAL RASH Items with * indicate a potential emergency and should be followed up as soon as possible.  Feel free to call the clinic should you have any questions or concerns. The clinic phone number is [phone].  Please show the [place] at check-in to the Emergency Department and triage nurse.  Ferumoxytol injection What is this medicine? FERUMOXYTOL is an iron complex. Iron is used to make healthy red blood cells, which carry oxygen and nutrients throughout the body. This medicine is used to treat iron deficiency anemia. This medicine may be used for other purposes; ask your health care provider or pharmacist if you have questions. COMMON BRAND NAME(S): Feraheme What should I tell my health care provider before I take this medicine? They need to know if you have any of these conditions: -anemia not caused by low iron levels -high levels of iron in the blood -magnetic resonance imaging (MRI) test scheduled -an unusual or allergic reaction to iron, other medicines, foods, dyes, or preservatives -pregnant or trying to get pregnant -breast-feeding How should I use this medicine? This medicine is for injection into a vein. It is given by a  health care professional in a hospital or clinic setting. Talk to your pediatrician regarding the use of this medicine in children. Special care may be needed. Overdosage: If you think you have taken too much of this medicine contact a poison control center or emergency room at once. NOTE: This medicine is only for you. Do not share this medicine with others. What if I miss a dose? It is important not to miss your dose. Call your doctor or health care professional if you are unable to keep an appointment. What may interact with this medicine? This medicine may interact with the following medications: -other iron products This list may not describe all possible interactions. Give your health care provider a list of all the medicines, herbs, non-prescription drugs, or dietary supplements you use. Also tell them if you smoke, drink alcohol, or use illegal drugs. Some items may interact with your medicine. What should I watch for while using this medicine? Visit your doctor or healthcare professional regularly. Tell your doctor or healthcare professional if your symptoms do not start to get better or if they get worse. You may need blood work done while you are taking this medicine. You may need to follow a special diet. Talk to your doctor. Foods that contain iron include: whole grains/cereals, dried fruits, beans, or peas, leafy green vegetables, and organ meats (liver, kidney). What side effects may I notice from receiving this medicine? Side effects that you should report to your doctor or health care professional as soon as possible: -allergic reactions like skin rash, itching or hives, swelling of the face, lips, or tongue -breathing problems -changes  in blood pressure -feeling faint or lightheaded, falls -fever or chills -flushing, sweating, or hot feelings -swelling of the ankles or feet Side effects that usually do not require medical attention (report to your doctor or health care  professional if they continue or are bothersome): -diarrhea -headache -nausea, vomiting -stomach pain This list may not describe all possible side effects. Call your doctor for medical advice about side effects. You may report side effects to FDA at 1-800-FDA-1088. Where should I keep my medicine? This drug is given in a hospital or clinic and will not be stored at home. NOTE: This sheet is a summary. It may not cover all possible information. If you have questions about this medicine, talk to your doctor, pharmacist, or health care provider.  2019 Elsevier/Gold Standard (2016-03-12 20:21:10)

## 2018-07-09 ENCOUNTER — Telehealth: Payer: Self-pay | Admitting: *Deleted

## 2018-07-09 ENCOUNTER — Telehealth: Payer: Self-pay | Admitting: Hematology and Oncology

## 2018-07-09 LAB — FERRITIN: Ferritin: 242 ng/mL (ref 11–307)

## 2018-07-09 NOTE — Telephone Encounter (Signed)
Scheduled appt per 6/3 sch message - unable to reach son - left message with appt date and time

## 2018-07-09 NOTE — Telephone Encounter (Signed)
Patients son agrees with 8 week plan. He will contact this office with any questions or concerns in the interm. Scheduling message sent.

## 2018-07-09 NOTE — Telephone Encounter (Signed)
-----   Message from [name], MD sent at 07/09/2018 10:11 AM EDT ----- Regarding: call son Her hemoglobin has been very stable along with ferritin I have cancelled her appt on 7/14. I suggest 8 weeks rather than 6 weeks If her son agrees, she needs labs/flush, see me and IV iron in 8 weeks

## 2018-08-19 ENCOUNTER — Ambulatory Visit: Payer: Medicare Other

## 2018-08-19 ENCOUNTER — Ambulatory Visit: Payer: Medicare Other | Admitting: Hematology and Oncology

## 2018-08-19 ENCOUNTER — Other Ambulatory Visit: Payer: Medicare Other

## 2018-08-26 ENCOUNTER — Other Ambulatory Visit: Payer: Self-pay | Admitting: Gastroenterology

## 2018-09-05 ENCOUNTER — Ambulatory Visit: Payer: Medicare Other | Admitting: Hematology and Oncology

## 2018-09-05 ENCOUNTER — Ambulatory Visit: Payer: Medicare Other

## 2018-09-05 ENCOUNTER — Other Ambulatory Visit: Payer: Medicare Other

## 2018-09-23 ENCOUNTER — Inpatient Hospital Stay (HOSPITAL_BASED_OUTPATIENT_CLINIC_OR_DEPARTMENT_OTHER): Payer: Medicare Other | Admitting: Hematology and Oncology

## 2018-09-23 ENCOUNTER — Inpatient Hospital Stay: Payer: Medicare Other

## 2018-09-23 ENCOUNTER — Other Ambulatory Visit: Payer: Self-pay

## 2018-09-23 ENCOUNTER — Inpatient Hospital Stay: Payer: Medicare Other | Attending: Hematology and Oncology

## 2018-09-23 DIAGNOSIS — Z9981 Dependence on supplemental oxygen: Secondary | ICD-10-CM | POA: Diagnosis not present

## 2018-09-23 DIAGNOSIS — K31819 Angiodysplasia of stomach and duodenum without bleeding: Secondary | ICD-10-CM | POA: Diagnosis not present

## 2018-09-23 DIAGNOSIS — R0602 Shortness of breath: Secondary | ICD-10-CM | POA: Diagnosis not present

## 2018-09-23 DIAGNOSIS — Z79899 Other long term (current) drug therapy: Secondary | ICD-10-CM | POA: Insufficient documentation

## 2018-09-23 DIAGNOSIS — D500 Iron deficiency anemia secondary to blood loss (chronic): Secondary | ICD-10-CM

## 2018-09-23 DIAGNOSIS — I350 Nonrheumatic aortic (valve) stenosis: Secondary | ICD-10-CM

## 2018-09-23 LAB — CBC WITH DIFFERENTIAL/PLATELET
Abs Immature Granulocytes: 0.02 10*3/uL (ref 0.00–0.07)
Basophils Absolute: 0 10*3/uL (ref 0.0–0.1)
Basophils Relative: 0 %
Eosinophils Absolute: 0.1 10*3/uL (ref 0.0–0.5)
Eosinophils Relative: 1 %
HCT: 36.6 % (ref 36.0–46.0)
Hemoglobin: 12.1 g/dL (ref 12.0–15.0)
Immature Granulocytes: 0 %
Lymphocytes Relative: 12 %
Lymphs Abs: 0.6 10*3/uL — ABNORMAL LOW (ref 0.7–4.0)
MCH: 31.5 pg (ref 26.0–34.0)
MCHC: 33.1 g/dL (ref 30.0–36.0)
MCV: 95.3 fL (ref 80.0–100.0)
Monocytes Absolute: 0.4 10*3/uL (ref 0.1–1.0)
Monocytes Relative: 8 %
Neutro Abs: 4.1 10*3/uL (ref 1.7–7.7)
Neutrophils Relative %: 79 %
Platelets: 168 10*3/uL (ref 150–400)
RBC: 3.84 MIL/uL — ABNORMAL LOW (ref 3.87–5.11)
RDW: 13.2 % (ref 11.5–15.5)
WBC: 5.3 10*3/uL (ref 4.0–10.5)
nRBC: 0 % (ref 0.0–0.2)

## 2018-09-23 LAB — FERRITIN: Ferritin: 504 ng/mL — ABNORMAL HIGH (ref 11–307)

## 2018-09-25 ENCOUNTER — Encounter: Payer: Self-pay | Admitting: Hematology and Oncology

## 2018-09-25 NOTE — Progress Notes (Signed)
[facility] OFFICE PROGRESS NOTE  [name], MD  ASSESSMENT & PLAN:  Iron deficiency anemia due to chronic blood loss The patient likely had intermittent GI bleed She is symptomatic with fatigue, multifactorial due to possible chronic GI bleed, aortic stenosis and congestive heart failure She does not need blood transfusion She does not need IV iron today We do not plan to wait for ferritin level before each dose of intravenous iron; if she presents with anemia, we will proceed with intravenous iron   GAVE (gastric antral vascular ectasia) She denies recent bleeding.  Aortic stenosis She is on oxygen for chronic shortness of breath She will continue medical management    No orders of the defined types were placed in this encounter.   INTERVAL HISTORY: IDY RAWLING 85 y.o. female returns for further follow-up I also collaborated her history with the son over the telephone Her shortness of breath is manageable on oxygen She denies recent chest pain The patient denies any recent signs or symptoms of bleeding such as spontaneous epistaxis, hematuria or hematochezia. No infusion reactions. Denies recent infection, fever or chills  SUMMARY OF HEMATOLOGIC HISTORY:  She was found to have abnormal CBC from recent blood count monitoring. The patient have chronic iron deficiency anemia due to recurrent GI bleed. 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 She received blood transfusion and iron treatment Since February 2018, she received intravenous iron every 4- 6  weeks  I have reviewed the past medical history, past surgical history, [name]; risedronate sodium; fosamax [alendronate sodium]; lipitor [atorvastatin]; pregabalin; tolterodine tartrate; and wasp venom.  MEDICATIONS:  Current Outpatient Medications  Medication Sig Dispense Refill  . acetaminophen (TYLENOL) 500 MG tablet Take 500 mg by mouth 2 (two) times daily as needed for mild pain or fever.    [name] albuterol (PROVENTIL HFA;VENTOLIN HFA) 108 (90 Base) MCG/ACT inhaler Inhale 1-2 puffs into the lungs every 6 (six) hours as needed for wheezing or shortness of breath. 1 Inhaler 0  . ALPRAZolam (XANAX) 0.25 MG tablet Take 1 tablet (0.25 mg total) by mouth 2 (two) times daily as needed for anxiety. 30 tablet 0  . Calcium Carb-Cholecalciferol (CALCIUM-VITAMIN D) 500-200 MG-UNIT tablet Take by mouth.    . cholecalciferol (VITAMIN D) 1000 units tablet Take 2,000 Units by mouth daily.    [name] DEXILANT 60 MG capsule TAKE 1 TABLET BY MOUTH DAILY EVERY MORNING. 90 capsule 2  . EPINEPHrine (EPIPEN 2-PAK) 0.3 mg/0.3 mL IJ SOAJ injection Inject 0.3 mg into the muscle once as needed (for severe allergic reaction).    . fluorometholone (FML) 0.1 % ophthalmic suspension Place 1 drop into the left eye daily.     . folic acid (FOLVITE) 623 MCG tablet Take 800 mcg by mouth daily.    [name] gabapentin (NEURONTIN) 400 MG capsule Take 2 capsules (800 mg total) by mouth 2 (two) times daily. 60 capsule 0  . hydroxypropyl methylcellulose / hypromellose (ISOPTO TEARS / GONIOVISC) 2.5 % ophthalmic solution Place 1-2 drops into  the right eye as needed for dry eyes.     [name] sertraline (ZOLOFT) 100 MG tablet Take 100 mg by mouth daily.     . verapamil (CALAN-SR) 240 MG CR tablet Take 240 mg by mouth daily.     No current facility-administered medications for this visit.      REVIEW OF SYSTEMS:    Constitutional: Denies fevers, [name], nose, mouth, throat, [name], [name], [name], [name], [name], no new changes  All other systems were reviewed with the patient and are negative.  PHYSICAL EXAMINATION: ECOG PERFORMANCE STATUS: 2 - Symptomatic, <50% confined to bed  Vitals:   09/23/18 1528  BP: 114/60  Pulse: 83  Resp: 18  Temp: 98.3 F (36.8 C)  SpO2: 95%   There were no vitals filed for this visit.  GENERAL:alert, no distress and comfortable.  She looks frail with oxygen in situ SKIN: skin color, texture, turgor are normal, [name], [name], sclera clear OROPHARYNX:no exudate, no erythema and lips, buccal mucosa, and tongue normal  NECK: supple, thyroid normal size, non-tender, without nodularity LYMPH:  no palpable lymphadenopathy in the cervical, axillary or inguinal LUNGS: clear to auscultation and percussion with normal breathing effort HEART: regular rate & rhythm with ejection systolic murmur and no lower extremity edema ABDOMEN:abdomen soft, non-tender and normal bowel sounds Musculoskeletal:no cyanosis of digits and no clubbing  NEURO: alert & oriented x 3 with fluent speech, no focal motor/sensory deficits  LABORATORY DATA:  I have reviewed the data as listed     Component Value Date/Time   NA 134 (L) 02/07/2017 0350   NA 139 03/23/2016 1448   K 4.7 02/07/2017 0350   K 4.0 03/23/2016 1448   CL 106 02/07/2017 0350   CO2 22 02/07/2017 0350   CO2 23 03/23/2016  1448   GLUCOSE 78 02/07/2017 0350   GLUCOSE 94 03/23/2016 1448   BUN 15 02/07/2017 0350   BUN 28.5 (H) 03/23/2016 1448   CREATININE 1.05 (H) 02/07/2017 0350   CREATININE 1.4 (H) 03/23/2016 1448   CALCIUM 8.0 (L) 02/07/2017 0350   CALCIUM 10.7 (H) 03/23/2016 1448   PROT 6.3 (L) 02/02/2017 2330   PROT 6.6 03/23/2016 1448   ALBUMIN 3.3 (L) 02/02/2017 2330   ALBUMIN 3.7 03/23/2016 1448   AST 15 02/02/2017 2330   AST 16 03/23/2016 1448   ALT 9 (L) 02/02/2017 2330   ALT 10 03/23/2016 1448   ALKPHOS 86 02/02/2017 2330   ALKPHOS 76 03/23/2016 1448   BILITOT 0.3 02/02/2017 2330   BILITOT 0.29 03/23/2016 1448   GFRNONAA 47 (L) 02/07/2017 0350   GFRAA 54 (L) 02/07/2017 0350    No results found for: SPEP, UPEP  Lab Results  Component Value Date   WBC 5.3 09/23/2018   NEUTROABS 4.1 09/23/2018   HGB 12.1 09/23/2018   HCT 36.6 09/23/2018   MCV 95.3 09/23/2018   PLT 168 09/23/2018      Chemistry      Component Value Date/Time   NA 134 (L) 02/07/2017 0350   NA 139 03/23/2016 1448   K 4.7 02/07/2017 0350   K 4.0  03/23/2016 1448   CL 106 02/07/2017 0350   CO2 22 02/07/2017 0350   CO2 23 03/23/2016 1448   BUN 15 02/07/2017 0350   BUN 28.5 (H) 03/23/2016 1448   CREATININE 1.05 (H) 02/07/2017 0350   CREATININE 1.4 (H) 03/23/2016 1448      Component Value Date/Time   CALCIUM 8.0 (L) 02/07/2017 0350   CALCIUM 10.7 (H) 03/23/2016 1448   ALKPHOS 86 02/02/2017 2330   ALKPHOS 76 03/23/2016 1448   AST 15 02/02/2017 2330   AST 16 03/23/2016 1448   ALT 9 (L) 02/02/2017 2330   ALT 10 03/23/2016 1448   BILITOT 0.3 02/02/2017 2330   BILITOT 0.29 03/23/2016 1448       I spent 15 minutes counseling the patient face to face. The total time spent in the appointment was 20 minutes and more than 50% was on counseling.   All questions were answered. The patient knows to call the clinic with any problems, questions or concerns. No barriers to learning was detected.    [name],  MD 8/20/202010:48 AM

## 2018-09-25 NOTE — Assessment & Plan Note (Signed)
The patient likely had intermittent GI bleed She is symptomatic with fatigue, multifactorial due to possible chronic GI bleed, aortic stenosis and congestive heart failure She does not need blood transfusion She does not need IV iron today We do not plan to wait for ferritin level before each dose of intravenous iron; if she presents with anemia, we will proceed with intravenous iron

## 2018-09-25 NOTE — Assessment & Plan Note (Signed)
She is on oxygen for chronic shortness of breath She will continue medical management

## 2018-09-25 NOTE — Assessment & Plan Note (Signed)
She denies recent bleeding.

## 2018-10-01 ENCOUNTER — Telehealth: Payer: Self-pay | Admitting: Hematology and Oncology

## 2018-10-01 NOTE — Telephone Encounter (Signed)
I talk with patients son regarding schedule °

## 2018-10-29 DIAGNOSIS — M810 Age-related osteoporosis without current pathological fracture: Secondary | ICD-10-CM | POA: Diagnosis not present

## 2018-10-29 DIAGNOSIS — K219 Gastro-esophageal reflux disease without esophagitis: Secondary | ICD-10-CM | POA: Diagnosis not present

## 2018-10-29 DIAGNOSIS — Z23 Encounter for immunization: Secondary | ICD-10-CM | POA: Diagnosis not present

## 2018-10-29 DIAGNOSIS — J9611 Chronic respiratory failure with hypoxia: Secondary | ICD-10-CM | POA: Diagnosis not present

## 2018-10-29 DIAGNOSIS — Z0000 Encounter for general adult medical examination without abnormal findings: Secondary | ICD-10-CM | POA: Diagnosis not present

## 2018-10-29 DIAGNOSIS — D649 Anemia, unspecified: Secondary | ICD-10-CM | POA: Diagnosis not present

## 2018-10-29 DIAGNOSIS — F419 Anxiety disorder, unspecified: Secondary | ICD-10-CM | POA: Diagnosis not present

## 2018-11-03 ENCOUNTER — Other Ambulatory Visit: Payer: Self-pay | Admitting: Hematology and Oncology

## 2018-11-03 DIAGNOSIS — D509 Iron deficiency anemia, unspecified: Secondary | ICD-10-CM

## 2018-11-11 IMAGING — CR DG CHEST 2V
2 series · 2 of 2 positions shown · non-contrast
Comparison: PA and lateral chest x-ray January 10, 2011

CLINICAL DATA: Four days of right axillary rib pain with no known
injury. History of CHF, peripheral thrombosis. Former smoker.

EXAM:
CHEST  2 VIEW

[w chest lat]
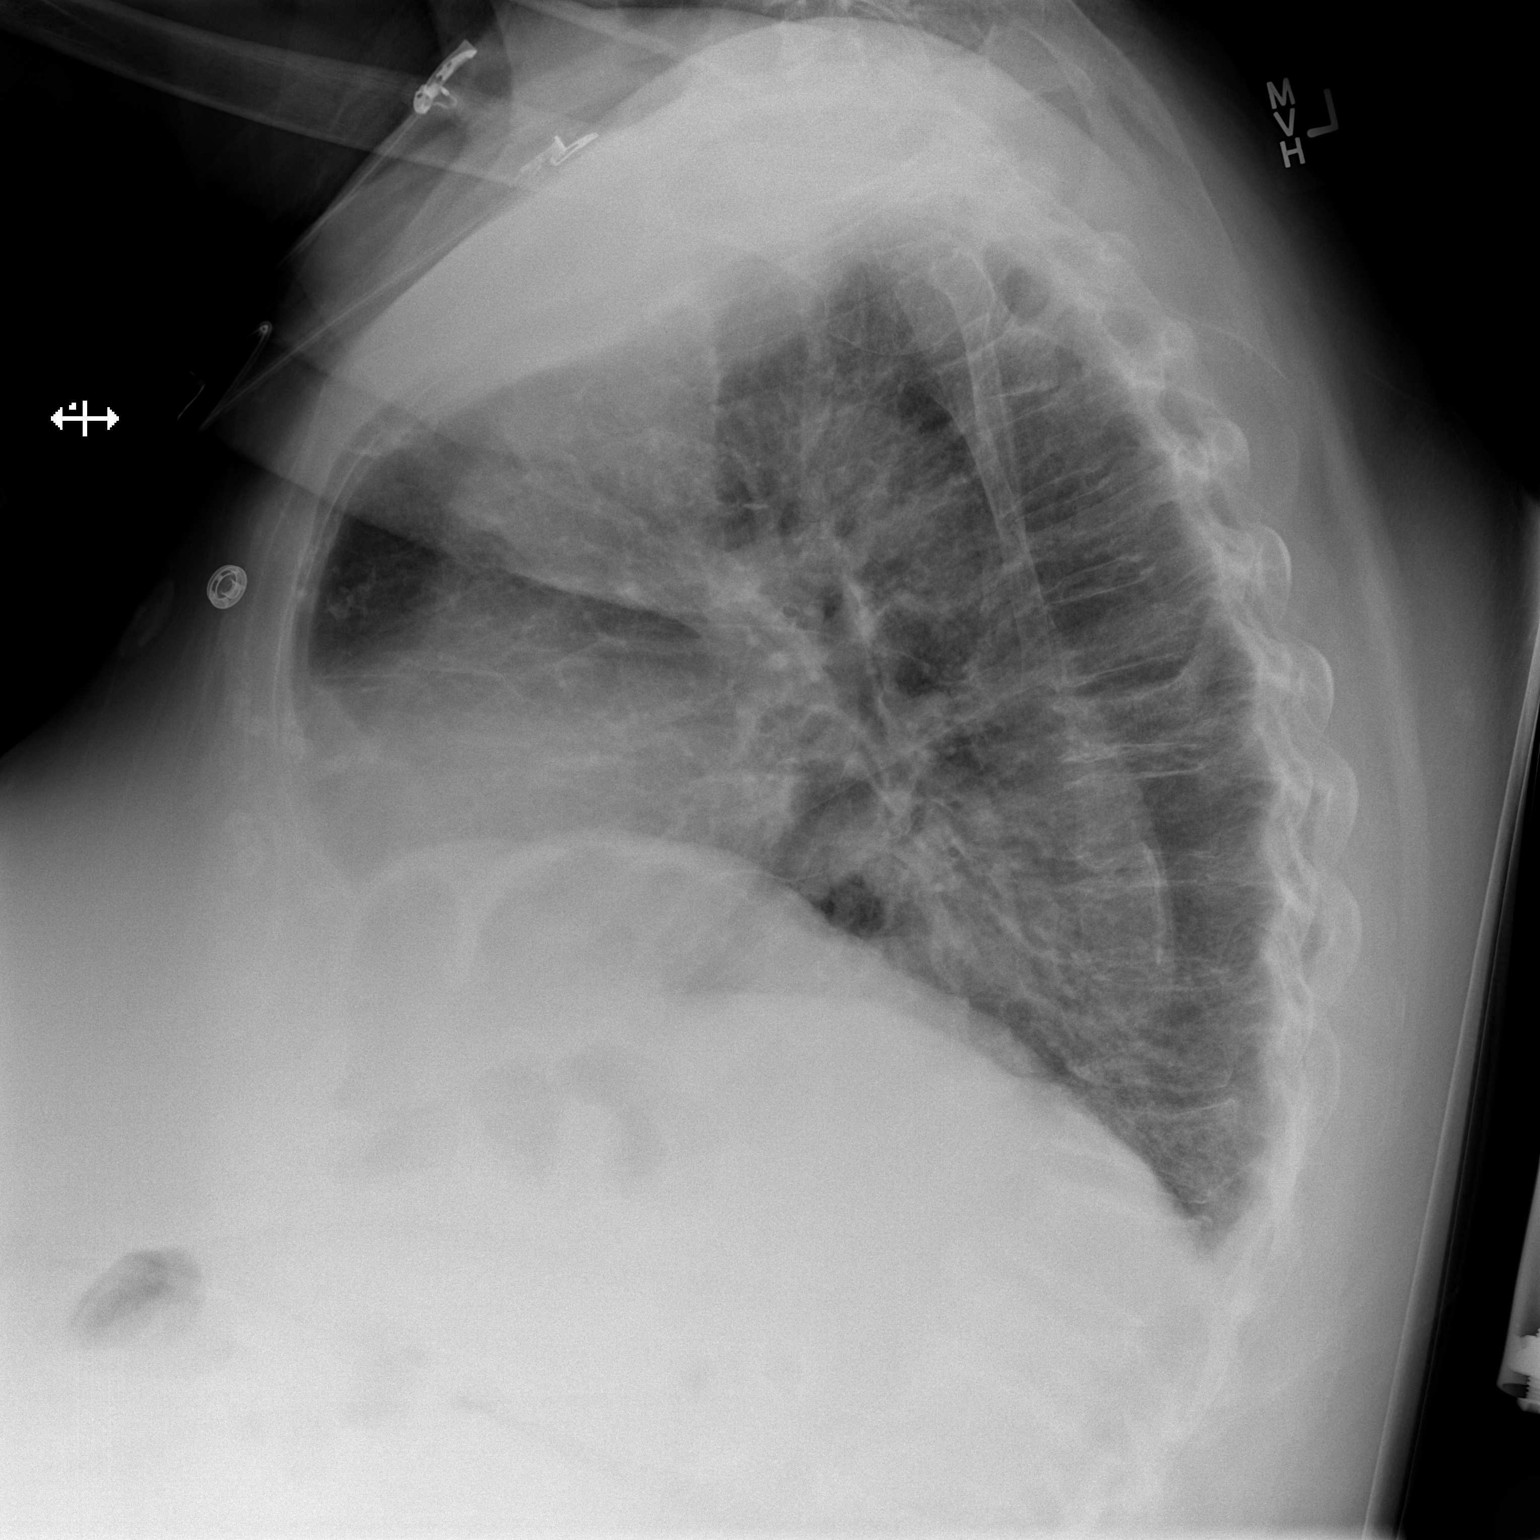

[x chest ap]
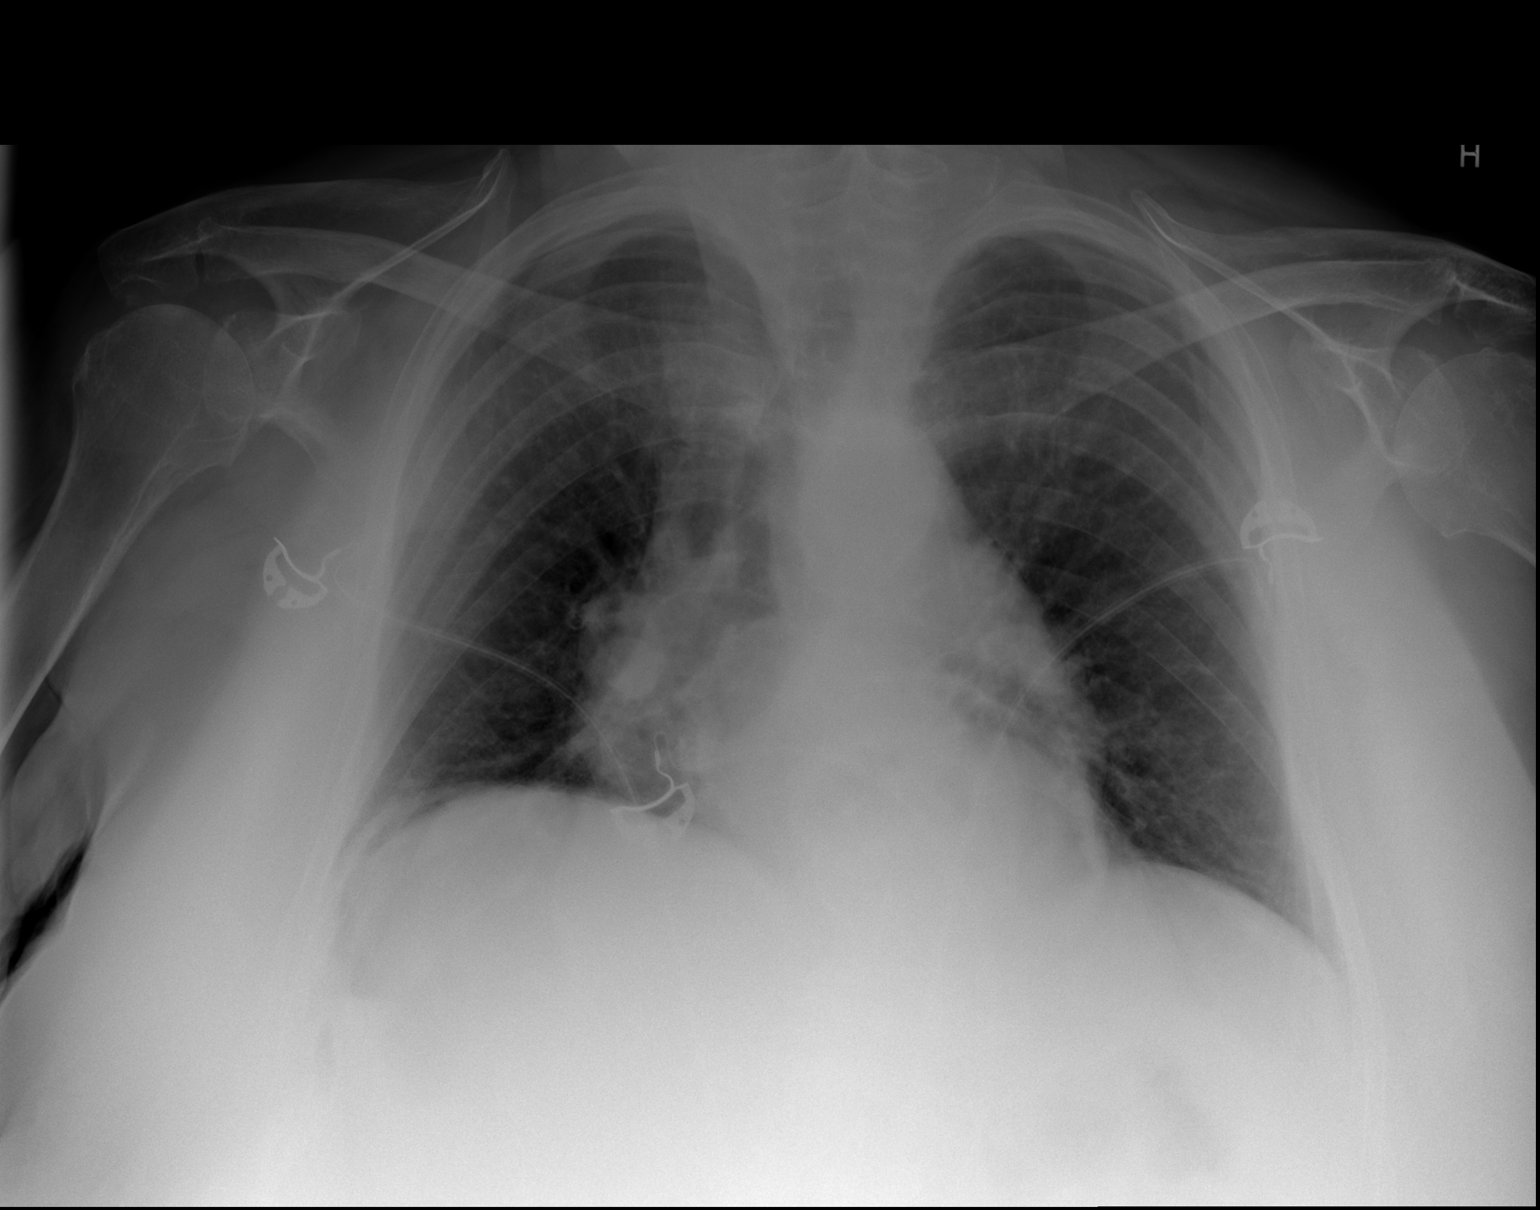

[2 of 2 positions shown; findings below may reference images not displayed]

FINDINGS: The lungs are reasonably well inflated. There is no focal
infiltrate. The cardiac silhouette is top-normal in size. The
pulmonary vascularity is not clearly engorged. There is a hiatal
hernia. There is no pleural effusion. There are compression
fractures of T7 and T11 with loss of height anteriorly of
approximately 40-50%. These compressions appear new.
IMPRESSION: Chronic bronchitic changes, stable. Mild cardiomegaly without
definite pulmonary edema.

New compression fractures of T7 and T11. The observed ribs are
unremarkable.

## 2018-12-02 ENCOUNTER — Inpatient Hospital Stay (HOSPITAL_BASED_OUTPATIENT_CLINIC_OR_DEPARTMENT_OTHER): Payer: Medicare Other | Admitting: Hematology and Oncology

## 2018-12-02 ENCOUNTER — Other Ambulatory Visit: Payer: Self-pay | Admitting: Hematology and Oncology

## 2018-12-02 ENCOUNTER — Ambulatory Visit: Payer: Medicare Other | Admitting: Hematology and Oncology

## 2018-12-02 ENCOUNTER — Encounter: Payer: Self-pay | Admitting: Hematology and Oncology

## 2018-12-02 ENCOUNTER — Other Ambulatory Visit: Payer: Medicare Other

## 2018-12-02 ENCOUNTER — Other Ambulatory Visit: Payer: Self-pay

## 2018-12-02 ENCOUNTER — Inpatient Hospital Stay: Payer: Medicare Other

## 2018-12-02 ENCOUNTER — Telehealth: Payer: Self-pay

## 2018-12-02 ENCOUNTER — Inpatient Hospital Stay: Payer: Medicare Other | Attending: Hematology and Oncology

## 2018-12-02 ENCOUNTER — Ambulatory Visit: Payer: Medicare Other

## 2018-12-02 DIAGNOSIS — D509 Iron deficiency anemia, unspecified: Secondary | ICD-10-CM

## 2018-12-02 DIAGNOSIS — I350 Nonrheumatic aortic (valve) stenosis: Secondary | ICD-10-CM | POA: Diagnosis not present

## 2018-12-02 DIAGNOSIS — D500 Iron deficiency anemia secondary to blood loss (chronic): Secondary | ICD-10-CM

## 2018-12-02 DIAGNOSIS — Z79899 Other long term (current) drug therapy: Secondary | ICD-10-CM | POA: Diagnosis not present

## 2018-12-02 DIAGNOSIS — K31819 Angiodysplasia of stomach and duodenum without bleeding: Secondary | ICD-10-CM

## 2018-12-02 DIAGNOSIS — N183 Chronic kidney disease, stage 3 unspecified: Secondary | ICD-10-CM | POA: Insufficient documentation

## 2018-12-02 DIAGNOSIS — E559 Vitamin D deficiency, unspecified: Secondary | ICD-10-CM | POA: Insufficient documentation

## 2018-12-02 DIAGNOSIS — D539 Nutritional anemia, unspecified: Secondary | ICD-10-CM

## 2018-12-02 DIAGNOSIS — R0602 Shortness of breath: Secondary | ICD-10-CM | POA: Insufficient documentation

## 2018-12-02 DIAGNOSIS — M810 Age-related osteoporosis without current pathological fracture: Secondary | ICD-10-CM | POA: Diagnosis not present

## 2018-12-02 DIAGNOSIS — D508 Other iron deficiency anemias: Secondary | ICD-10-CM

## 2018-12-02 DIAGNOSIS — F419 Anxiety disorder, unspecified: Secondary | ICD-10-CM | POA: Insufficient documentation

## 2018-12-02 DIAGNOSIS — Z0000 Encounter for general adult medical examination without abnormal findings: Secondary | ICD-10-CM | POA: Diagnosis not present

## 2018-12-02 DIAGNOSIS — K219 Gastro-esophageal reflux disease without esophagitis: Secondary | ICD-10-CM | POA: Diagnosis not present

## 2018-12-02 LAB — CBC WITH DIFFERENTIAL/PLATELET
Abs Immature Granulocytes: 0.01 10*3/uL (ref 0.00–0.07)
Basophils Absolute: 0 10*3/uL (ref 0.0–0.1)
Basophils Relative: 0 %
Eosinophils Absolute: 0 10*3/uL (ref 0.0–0.5)
Eosinophils Relative: 1 %
HCT: 36.5 % (ref 36.0–46.0)
Hemoglobin: 11.9 g/dL — ABNORMAL LOW (ref 12.0–15.0)
Immature Granulocytes: 0 %
Lymphocytes Relative: 13 %
Lymphs Abs: 0.6 10*3/uL — ABNORMAL LOW (ref 0.7–4.0)
MCH: 31.2 pg (ref 26.0–34.0)
MCHC: 32.6 g/dL (ref 30.0–36.0)
MCV: 95.5 fL (ref 80.0–100.0)
Monocytes Absolute: 0.4 10*3/uL (ref 0.1–1.0)
Monocytes Relative: 8 %
Neutro Abs: 3.5 10*3/uL (ref 1.7–7.7)
Neutrophils Relative %: 78 %
Platelets: 169 10*3/uL (ref 150–400)
RBC: 3.82 MIL/uL — ABNORMAL LOW (ref 3.87–5.11)
RDW: 12.4 % (ref 11.5–15.5)
WBC: 4.5 10*3/uL (ref 4.0–10.5)
nRBC: 0 % (ref 0.0–0.2)

## 2018-12-02 LAB — COMPREHENSIVE METABOLIC PANEL
ALT: 8 U/L (ref 0–44)
AST: 13 U/L — ABNORMAL LOW (ref 15–41)
Albumin: 3.9 g/dL (ref 3.5–5.0)
Alkaline Phosphatase: 54 U/L (ref 38–126)
Anion gap: 11 (ref 5–15)
BUN: 30 mg/dL — ABNORMAL HIGH (ref 8–23)
CO2: 25 mmol/L (ref 22–32)
Calcium: 9 mg/dL (ref 8.9–10.3)
Chloride: 102 mmol/L (ref 98–111)
Creatinine, Ser: 1.38 mg/dL — ABNORMAL HIGH (ref 0.44–1.00)
GFR calc Af Amer: 39 mL/min — ABNORMAL LOW (ref >60)
GFR calc non Af Amer: 34 mL/min — ABNORMAL LOW (ref >60)
Glucose, Bld: 82 mg/dL (ref 70–99)
Potassium: 4.5 mmol/L (ref 3.5–5.1)
Sodium: 138 mmol/L (ref 135–145)
Total Bilirubin: 0.4 mg/dL (ref 0.3–1.2)
Total Protein: 6.6 g/dL (ref 6.5–8.1)

## 2018-12-02 LAB — VITAMIN D 25 HYDROXY (VIT D DEFICIENCY, FRACTURES): Vit D, 25-Hydroxy: 20.81 ng/mL — ABNORMAL LOW (ref 30–100)

## 2018-12-02 LAB — FERRITIN: Ferritin: 514 ng/mL — ABNORMAL HIGH (ref 11–307)

## 2018-12-02 MED ORDER — SODIUM CHLORIDE 0.9 % IV SOLN
INTRAVENOUS | Status: DC
  Administered 2018-12-02: 15:00:00 via INTRAVENOUS
  Filled 2018-12-02: qty 250

## 2018-12-02 MED ORDER — SODIUM CHLORIDE 0.9 % IV SOLN
510.0000 mg | Freq: Once | INTRAVENOUS | Status: AC
  Administered 2018-12-02: 510 mg via INTRAVENOUS
  Filled 2018-12-02: qty 510

## 2018-12-02 NOTE — Assessment & Plan Note (Signed)
Per patient request, vitamin D level was drawn and it came back low We will fax the result to her primary care doctor for further management

## 2018-12-02 NOTE — Patient Instructions (Signed)
Ferumoxytol injection What is this medicine? FERUMOXYTOL is an iron complex. Iron is used to make healthy red blood cells, which carry oxygen and nutrients throughout the body. This medicine is used to treat iron deficiency anemia. This medicine may be used for other purposes; ask your health care provider or pharmacist if you have questions. COMMON BRAND NAME(S): Feraheme What should I tell my health care provider before I take this medicine? They need to know if you have any of these conditions:  anemia not caused by low iron levels  high levels of iron in the blood  magnetic resonance imaging (MRI) test scheduled  an unusual or allergic reaction to iron, other medicines, foods, dyes, or preservatives  pregnant or trying to get pregnant  breast-feeding How should I use this medicine? This medicine is for injection into a vein. It is given by a health care professional in a hospital or clinic setting. Talk to your pediatrician regarding the use of this medicine in children. Special care may be needed. Overdosage: If you think you have taken too much of this medicine contact a poison control center or emergency room at once. NOTE: This medicine is only for you. Do not share this medicine with others. What if I miss a dose? It is important not to miss your dose. Call your doctor or health care professional if you are unable to keep an appointment. What may interact with this medicine? This medicine may interact with the following medications:  other iron products This list may not describe all possible interactions. Give your health care provider a list of all the medicines, herbs, non-prescription drugs, or dietary supplements you use. Also tell them if you smoke, drink alcohol, or use illegal drugs. Some items may interact with your medicine. What should I watch for while using this medicine? Visit your doctor or healthcare professional regularly. Tell your doctor or healthcare  professional if your symptoms do not start to get better or if they get worse. You may need blood work done while you are taking this medicine. You may need to follow a special diet. Talk to your doctor. Foods that contain iron include: whole grains/cereals, dried fruits, beans, or peas, leafy green vegetables, and organ meats (liver, kidney). What side effects may I notice from receiving this medicine? Side effects that you should report to your doctor or health care professional as soon as possible:  allergic reactions like skin rash, itching or hives, swelling of the face, lips, or tongue  breathing problems  changes in blood pressure  feeling faint or lightheaded, falls  fever or chills  flushing, sweating, or hot feelings  swelling of the ankles or feet Side effects that usually do not require medical attention (report to your doctor or health care professional if they continue or are bothersome):  diarrhea  headache  nausea, vomiting  stomach pain This list may not describe all possible side effects. Call your doctor for medical advice about side effects. You may report side effects to FDA at 1-800-FDA-1088. Where should I keep my medicine? This drug is given in a hospital or clinic and will not be stored at home. NOTE: This sheet is a summary. It may not cover all possible information. If you have questions about this medicine, talk to your doctor, pharmacist, or health care provider.  2020 Elsevier/Gold Standard (2016-03-12 20:21:10) Coronavirus (COVID-19) Are you at risk?  Are you at risk for the Coronavirus (COVID-19)?  To be considered HIGH RISK for Coronavirus (COVID-19),   you have to meet the following criteria:  . Traveled to [place], [place], [place], [place] or [place]; or in the [place] to [place], [place], [place], or [place]; and have fever, cough, and shortness of breath within the last 2 weeks of travel OR . Been in close contact with a person  diagnosed with COVID-19 within the last 2 weeks and have fever, cough, and shortness of breath . IF YOU DO NOT MEET THESE CRITERIA, YOU ARE CONSIDERED LOW RISK FOR COVID-19.  What to do if you are HIGH RISK for COVID-19?  . If you are having a medical emergency, call 911. . Seek medical care right away. Before you go to a doctor's office, urgent care or emergency department, call ahead and tell them about your recent travel, contact with someone diagnosed with COVID-19, and your symptoms. You should receive instructions from your physician's office regarding next steps of care.  . When you arrive at healthcare provider, tell the healthcare staff immediately you have returned from visiting [place], [place], [place], [place] or [place]; or traveled in the [place] to [place], [place], [place], or [place]; in the last two weeks or you have been in close contact with a person diagnosed with COVID-19 in the last 2 weeks.   . Tell the health care staff about your symptoms: fever, cough and shortness of breath. . After you have been seen by a medical provider, you will be either: o Tested for (COVID-19) and discharged home on quarantine except to seek medical care if symptoms worsen, and asked to  - Stay home and avoid contact with others until you get your results (4-5 days)  - Avoid travel on public transportation if possible (such as bus, train, or airplane) or o Sent to the Emergency Department by EMS for evaluation, COVID-19 testing, and possible admission depending on your condition and test results.  What to do if you are LOW RISK for COVID-19?  Reduce your risk of any infection by using the same precautions used for avoiding the common cold or flu:  . Wash your hands often with soap and warm water for at least 20 seconds.  If soap and water are not readily available, use an alcohol-based hand sanitizer with at least 60% alcohol.  . If coughing or sneezing, cover your mouth and nose by  coughing or sneezing into the elbow areas of your shirt or coat, into a tissue or into your sleeve (not your hands). . Avoid shaking hands with others and consider head nods or verbal greetings only. . Avoid touching your eyes, nose, or mouth with unwashed hands.  . Avoid close contact with people who are sick. . Avoid places or events with large numbers of people in one location, like concerts or sporting events. . Carefully consider travel plans you have or are making. . If you are planning any travel outside or inside the [place], visit the CDC's Travelers' Health webpage for the latest health notices. . If you have some symptoms but not all symptoms, continue to monitor at home and seek medical attention if your symptoms worsen. . If you are having a medical emergency, call 911.   ADDITIONAL HEALTHCARE OPTIONS FOR PATIENTS  [facility] / e-Visit: [email or web address]         [facility]: [phone]  [facility]: [phone]                   [facility]: [phone]   

## 2018-12-02 NOTE — Assessment & Plan Note (Signed)
I gave her copies of her test results Her renal function is stable A copy of the result will be faxed to her primary care doctor

## 2018-12-02 NOTE — Progress Notes (Signed)
[place] OFFICE PROGRESS NOTE  [name], MD  ASSESSMENT & PLAN:  Iron deficiency anemia due to chronic blood loss The patient had received numerous doses of IV iron with stabilization of her anemia Her last hospitalization was almost 2 years ago Due to stability of her blood count and iron studies, I recommend giving her another dose of IV iron today despite stable iron level However, I plan to lengthen her next visit until 6 months from now Both the patient and her son agree with the plan of care  Aortic stenosis She is on oxygen for chronic shortness of breath She will continue medical management   CKD (chronic kidney disease), stage III I gave her copies of her test results Her renal function is stable A copy of the result will be faxed to her primary care doctor  Vitamin D deficiency Per patient request, vitamin D level was drawn and it came back low We will fax the result to her primary care doctor for further management   No orders of the defined types were placed in this encounter.   INTERVAL HISTORY: Sophia [name] 85 y.o. female returns for further follow-up with her son Since last time I saw her, she has been doing well She denies recent cough, chest pain or shortness of breath She is on oxygen therapy long-term and his symptoms are stable She denies infusion reaction The patient denies any recent signs or symptoms of bleeding such as spontaneous epistaxis, hematuria or hematochezia.   SUMMARY OF HEMATOLOGIC HISTORY:  She was found to have abnormal CBC from recent blood count monitoring. The patient have chronic iron deficiency anemia due to recurrent GI bleed. 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 On 09/30/2013, bone marrow biopsy was done which showed no evidence to suggest myelodysplastic syndrome From July 2015 to present, she has received numerous intravenous iron infusion On  07/08/2014, repeat endoscopy revealed multiple AVMs and GAVE and she have repeat ablation therapy She was admitted to the hospital in December 2017 due to severe anemia. She received blood transfusion and iron treatment Since February 2018, she received intravenous iron every 4- 6 weeks   I have reviewed the past medical history, past surgical history, [name]; risedronate sodium; fosamax [alendronate sodium]; lipitor [atorvastatin]; pregabalin; tolterodine tartrate; and wasp venom.  MEDICATIONS:  Current Outpatient Medications  Medication Sig Dispense Refill  . acetaminophen (TYLENOL) 500 MG tablet Take 500 mg by mouth 2 (two) times daily as needed for mild pain or fever.    [name] albuterol (PROVENTIL HFA;VENTOLIN HFA) 108 (90 Base) MCG/ACT inhaler Inhale 1-2 puffs into the lungs every 6 (six) hours as needed for wheezing or shortness of breath. 1 Inhaler 0  . ALPRAZolam (XANAX) 0.25 MG tablet Take 1 tablet (0.25 mg total) by mouth 2 (two) times daily as needed for anxiety. (Patient taking differently: Take 0.25 mg by mouth 4 (four) times daily as needed for anxiety. ) 30 tablet 0  . Calcium Carb-Cholecalciferol (CALCIUM-VITAMIN D) 500-200 MG-UNIT tablet Take by mouth.    . cholecalciferol (VITAMIN D) 1000 units tablet Take 2,000 Units by mouth daily.    [name] DEXILANT 60 MG capsule TAKE 1 TABLET BY MOUTH DAILY EVERY MORNING. 90 capsule 2  . EPINEPHrine (EPIPEN 2-PAK) 0.3 mg/0.3 mL IJ SOAJ injection Inject 0.3 mg into the muscle  once as needed (for severe allergic reaction).    . fluorometholone (FML) 0.1 % ophthalmic suspension Place 1 drop into the left eye daily.     . folic acid (FOLVITE) 998 MCG tablet Take 800 mcg by mouth daily.    [name] gabapentin (NEURONTIN) 400 MG capsule Take 2 capsules (800 mg total) by mouth 2 (two) times daily. 60 capsule 0  .  hydroxypropyl methylcellulose / hypromellose (ISOPTO TEARS / GONIOVISC) 2.5 % ophthalmic solution Place 1-2 drops into the right eye as needed for dry eyes.     [name] sertraline (ZOLOFT) 100 MG tablet Take 100 mg by mouth daily.     . verapamil (CALAN-SR) 240 MG CR tablet Take 240 mg by mouth daily.     No current facility-administered medications for this visit.    Facility-Administered Medications Ordered in Other Visits  Medication Dose Route Frequency Provider Last Rate Last Dose  . 0.9 %  sodium chloride infusion   Intravenous Continuous [name], MD   Stopped at 12/02/18 1603     REVIEW OF SYSTEMS:   Constitutional: Denies fevers, [name], nose, mouth, throat, [name], [name], [name], [name], [name], no new changes  All other systems were reviewed with the patient and are negative.  PHYSICAL EXAMINATION: ECOG PERFORMANCE STATUS: 2 - Symptomatic, <50% confined to bed  Vitals:   12/02/18 1441  BP: 113/74  Pulse: 84  Resp: 18  Temp: 98.5 F (36.9 C)  SpO2: 97%   Filed Weights   12/02/18 1441  Weight: 202 lb 3.2 oz (91.7 kg)    GENERAL:alert, no distress and comfortable.  Examination is limited due to the patient's wheelchair-bound status.  She has oxygen delivered via nasal cannula SKIN: skin color, texture, turgor are normal, [name], [name], sclera clear OROPHARYNX:no exudate, no erythema and lips, buccal mucosa, and tongue normal  NECK: supple, thyroid normal size,  non-tender, without nodularity LYMPH:  no palpable lymphadenopathy in the cervical, axillary or inguinal LUNGS: clear to auscultation and percussion with normal breathing effort HEART: regular rate & rhythm with loud ejection systolic murmurs and no lower extremity edema ABDOMEN:abdomen soft, non-tender and normal bowel sounds Musculoskeletal:no cyanosis of digits and no clubbing  NEURO: alert & oriented x 3 with fluent speech, no focal motor/sensory deficits  LABORATORY DATA:  I have reviewed the data as listed     Component Value Date/Time   NA 138 12/02/2018 1348   NA 139 03/23/2016 1448   K 4.5 12/02/2018 1348   K 4.0 03/23/2016 1448   CL 102 12/02/2018 1348   CO2 25 12/02/2018 1348   CO2 23 03/23/2016 1448   GLUCOSE 82 12/02/2018 1348   GLUCOSE 94 03/23/2016 1448   BUN 30 (H) 12/02/2018 1348   BUN 28.5 (H) 03/23/2016 1448   CREATININE 1.38 (H) 12/02/2018 1348   CREATININE 1.4 (H) 03/23/2016 1448   CALCIUM 9.0 12/02/2018 1348   CALCIUM 10.7 (H) 03/23/2016 1448   PROT 6.6 12/02/2018 1348   PROT 6.6 03/23/2016 1448   ALBUMIN 3.9 12/02/2018 1348   ALBUMIN 3.7 03/23/2016 1448   AST 13 (L) 12/02/2018 1348  AST 16 03/23/2016 1448   ALT 8 12/02/2018 1348   ALT 10 03/23/2016 1448   ALKPHOS 54 12/02/2018 1348   ALKPHOS 76 03/23/2016 1448   BILITOT 0.4 12/02/2018 1348   BILITOT 0.29 03/23/2016 1448   GFRNONAA 34 (L) 12/02/2018 1348   GFRAA 39 (L) 12/02/2018 1348    No results found for: SPEP, UPEP  Lab Results  Component Value Date   WBC 4.5 12/02/2018   NEUTROABS 3.5 12/02/2018   HGB 11.9 (L) 12/02/2018   HCT 36.5 12/02/2018   MCV 95.5 12/02/2018   PLT 169 12/02/2018      Chemistry      Component Value Date/Time   NA 138 12/02/2018 1348   NA 139 03/23/2016 1448   K 4.5 12/02/2018 1348   K 4.0 03/23/2016 1448   CL 102 12/02/2018 1348   CO2 25 12/02/2018 1348   CO2 23 03/23/2016 1448   BUN 30 (H) 12/02/2018 1348   BUN 28.5 (H) 03/23/2016 1448   CREATININE  1.38 (H) 12/02/2018 1348   CREATININE 1.4 (H) 03/23/2016 1448      Component Value Date/Time   CALCIUM 9.0 12/02/2018 1348   CALCIUM 10.7 (H) 03/23/2016 1448   ALKPHOS 54 12/02/2018 1348   ALKPHOS 76 03/23/2016 1448   AST 13 (L) 12/02/2018 1348   AST 16 03/23/2016 1448   ALT 8 12/02/2018 1348   ALT 10 03/23/2016 1448   BILITOT 0.4 12/02/2018 1348   BILITOT 0.29 03/23/2016 1448      I spent 15 minutes counseling the patient face to face. The total time spent in the appointment was 20 minutes and more than 50% was on counseling.   All questions were answered. The patient knows to call the clinic with any problems, questions or concerns. No barriers to learning was detected.    [name], MD 10/27/20204:54 PM

## 2018-12-02 NOTE — Telephone Encounter (Signed)
Son called and left a message requesting CMP and Vitamin D per PCP added to labs and faxed to PCP.  Called back. Dr. Alvy Bimler labs added.

## 2018-12-02 NOTE — Assessment & Plan Note (Signed)
She is on oxygen for chronic shortness of breath She will continue medical management

## 2018-12-02 NOTE — Assessment & Plan Note (Signed)
The patient had received numerous doses of IV iron with stabilization of her anemia Her last hospitalization was almost 2 years ago Due to stability of her blood count and iron studies, I recommend giving her another dose of IV iron today despite stable iron level However, I plan to lengthen her next visit until 6 months from now Both the patient and her son agree with the plan of care

## 2018-12-03 ENCOUNTER — Telehealth: Payer: Self-pay | Admitting: Hematology and Oncology

## 2018-12-03 ENCOUNTER — Telehealth: Payer: Self-pay

## 2018-12-03 NOTE — Telephone Encounter (Signed)
Scheduled appt per 10/27 sch message - mailed reminder letter with appt date and time   

## 2018-12-03 NOTE — Telephone Encounter (Signed)
-----   Message from [name], MD sent at 12/02/2018  4:12 PM EDT ----- Regarding: non urgent Pls let her son know Vitamin D is low Ferritin is high. So I am planning appt in 6 months I think he requests the vitamin D and CMET to her PCP to be faxed. Labs should have the order

## 2018-12-03 NOTE — Telephone Encounter (Signed)
Called and given below message. Son verbalized understanding. Fax labs to Dr. [name] office.

## 2019-04-14 DIAGNOSIS — Z23 Encounter for immunization: Secondary | ICD-10-CM | POA: Diagnosis not present

## 2019-05-07 ENCOUNTER — Telehealth: Payer: Self-pay

## 2019-05-07 NOTE — Telephone Encounter (Signed)
Son called and left a message. Covid vaccine is scheduled for the same day as appt's at [facility]. He needs to cancel 4/6 appts and reschedule. Should they wait so many days after covid vaccine is his question?

## 2019-05-07 NOTE — Telephone Encounter (Signed)
I think she likes afternoon appt The next long afternoon appt is on 4/20, pls move everything to 4/30

## 2019-05-12 ENCOUNTER — Ambulatory Visit: Payer: Medicare Other | Admitting: Hematology and Oncology

## 2019-05-12 ENCOUNTER — Other Ambulatory Visit: Payer: Medicare Other

## 2019-05-12 ENCOUNTER — Ambulatory Visit: Payer: Medicare Other

## 2019-05-12 DIAGNOSIS — Z23 Encounter for immunization: Secondary | ICD-10-CM | POA: Diagnosis not present

## 2019-06-05 ENCOUNTER — Inpatient Hospital Stay (HOSPITAL_BASED_OUTPATIENT_CLINIC_OR_DEPARTMENT_OTHER): Payer: Medicare Other | Admitting: Hematology and Oncology

## 2019-06-05 ENCOUNTER — Encounter: Payer: Self-pay | Admitting: Hematology and Oncology

## 2019-06-05 ENCOUNTER — Other Ambulatory Visit: Payer: Self-pay

## 2019-06-05 ENCOUNTER — Inpatient Hospital Stay: Payer: Medicare Other

## 2019-06-05 ENCOUNTER — Other Ambulatory Visit: Payer: Self-pay | Admitting: Hematology and Oncology

## 2019-06-05 ENCOUNTER — Inpatient Hospital Stay: Payer: Medicare Other | Attending: Hematology and Oncology

## 2019-06-05 VITALS — BP 140/55 | HR 60 | Resp 17

## 2019-06-05 DIAGNOSIS — J9610 Chronic respiratory failure, unspecified whether with hypoxia or hypercapnia: Secondary | ICD-10-CM | POA: Insufficient documentation

## 2019-06-05 DIAGNOSIS — K31819 Angiodysplasia of stomach and duodenum without bleeding: Secondary | ICD-10-CM | POA: Insufficient documentation

## 2019-06-05 DIAGNOSIS — D500 Iron deficiency anemia secondary to blood loss (chronic): Secondary | ICD-10-CM | POA: Diagnosis not present

## 2019-06-05 DIAGNOSIS — Z9981 Dependence on supplemental oxygen: Secondary | ICD-10-CM | POA: Insufficient documentation

## 2019-06-05 DIAGNOSIS — I350 Nonrheumatic aortic (valve) stenosis: Secondary | ICD-10-CM | POA: Insufficient documentation

## 2019-06-05 DIAGNOSIS — R5381 Other malaise: Secondary | ICD-10-CM | POA: Diagnosis not present

## 2019-06-05 DIAGNOSIS — D539 Nutritional anemia, unspecified: Secondary | ICD-10-CM

## 2019-06-05 DIAGNOSIS — J9611 Chronic respiratory failure with hypoxia: Secondary | ICD-10-CM

## 2019-06-05 DIAGNOSIS — D509 Iron deficiency anemia, unspecified: Secondary | ICD-10-CM

## 2019-06-05 DIAGNOSIS — D508 Other iron deficiency anemias: Secondary | ICD-10-CM

## 2019-06-05 LAB — CBC WITH DIFFERENTIAL/PLATELET
Abs Immature Granulocytes: 0.01 10*3/uL (ref 0.00–0.07)
Basophils Absolute: 0 10*3/uL (ref 0.0–0.1)
Basophils Relative: 0 %
Eosinophils Absolute: 0 10*3/uL (ref 0.0–0.5)
Eosinophils Relative: 1 %
HCT: 35 % — ABNORMAL LOW (ref 36.0–46.0)
Hemoglobin: 11.3 g/dL — ABNORMAL LOW (ref 12.0–15.0)
Immature Granulocytes: 0 %
Lymphocytes Relative: 15 %
Lymphs Abs: 0.6 10*3/uL — ABNORMAL LOW (ref 0.7–4.0)
MCH: 31.1 pg (ref 26.0–34.0)
MCHC: 32.3 g/dL (ref 30.0–36.0)
MCV: 96.4 fL (ref 80.0–100.0)
Monocytes Absolute: 0.4 10*3/uL (ref 0.1–1.0)
Monocytes Relative: 9 %
Neutro Abs: 3.2 10*3/uL (ref 1.7–7.7)
Neutrophils Relative %: 75 %
Platelets: 141 10*3/uL — ABNORMAL LOW (ref 150–400)
RBC: 3.63 MIL/uL — ABNORMAL LOW (ref 3.87–5.11)
RDW: 13.3 % (ref 11.5–15.5)
WBC: 4.2 10*3/uL (ref 4.0–10.5)
nRBC: 0 % (ref 0.0–0.2)

## 2019-06-05 MED ORDER — SODIUM CHLORIDE 0.9 % IV SOLN
510.0000 mg | Freq: Once | INTRAVENOUS | Status: AC
  Administered 2019-06-05: 510 mg via INTRAVENOUS
  Filled 2019-06-05: qty 510

## 2019-06-05 MED ORDER — SODIUM CHLORIDE 0.9 % IV SOLN
INTRAVENOUS | Status: DC
  Filled 2019-06-05: qty 250

## 2019-06-05 NOTE — Assessment & Plan Note (Signed)
The patient had received numerous doses of IV iron with stabilization of her anemia Over the last 6 months, her hemoglobin is fairly stable Iron studies are pending I recommend seeing her back again in 5 months for further follow-up and IV iron support Both the patient and her son agree with the plan of care

## 2019-06-05 NOTE — Assessment & Plan Note (Signed)
She denies recent bleeding.

## 2019-06-05 NOTE — Assessment & Plan Note (Signed)
She has chronic respiratory failure and oxygen dependent She has been prescribed long-term oxygen treatment She is debilitated  She will continue oxygen therapy as prescribed 

## 2019-06-05 NOTE — Assessment & Plan Note (Signed)
She is on oxygen for chronic shortness of breath She will continue medical management

## 2019-06-05 NOTE — Progress Notes (Signed)
[facility] OFFICE PROGRESS NOTE  [name], MD  ASSESSMENT & PLAN:  Iron deficiency anemia due to chronic blood loss The patient had received numerous doses of IV iron with stabilization of her anemia Over the last 6 months, her hemoglobin is fairly stable Iron studies are pending I recommend seeing her back again in 5 months for further follow-up and IV iron support Both the patient and her son agree with the plan of care  Aortic stenosis She is on oxygen for chronic shortness of breath She will continue medical management   GAVE (gastric antral vascular ectasia) She denies recent bleeding.  Respiratory failure, chronic (HCC) She has chronic respiratory failure and oxygen dependent She has been prescribed long-term oxygen treatment She is debilitated  She will continue oxygen therapy as prescribed   No orders of the defined types were placed in this encounter.   The total time spent in the appointment was 20 minutes encounter with patients including review of chart and various tests results, discussions about plan of care and coordination of care plan   All questions were answered. The patient knows to call the clinic with any problems, questions or concerns. No barriers to learning was detected.    Sophia Lark, MD 4/30/20216:01 PM  INTERVAL HISTORY: Sophia [name] 86 y.o. female returns for further follow-up on chronic iron deficiency anemia She is doing well according to her son No recent falls The patient denies any recent signs or symptoms of bleeding such as spontaneous epistaxis, hematuria or hematochezia. No recent infection, fever or chills She denies recent chest pain or worsening shortness of breath No recent exacerbation of congestive heart failure She tolerated intravenous iron well without side effects   SUMMARY OF HEMATOLOGIC HISTORY:  She was found to have abnormal CBC from recent blood count monitoring. The patient have  chronic iron deficiency anemia due to recurrent GI bleed. 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 She received blood transfusion and iron treatment Since February 2018, she received intravenous iron every 4- 6 weeks Starting in 2019 to 2020, she has reduced intravenous iron requirement  I have reviewed the past medical history, past surgical history, [name]; risedronate sodium; fosamax [alendronate sodium]; lipitor [atorvastatin]; pregabalin; tolterodine tartrate; and wasp venom.  MEDICATIONS:  Current Outpatient Medications  Medication Sig Dispense Refill  . acetaminophen (TYLENOL) 500 MG tablet Take 500 mg by mouth 2 (two) times daily as needed for mild pain or fever.    [name] albuterol (PROVENTIL HFA;VENTOLIN HFA) 108 (90 Base) MCG/ACT inhaler Inhale 1-2 puffs into the lungs every 6 (six) hours as needed for wheezing or shortness of breath. 1 Inhaler 0  . ALPRAZolam (XANAX) 0.25 MG tablet Take 1 tablet (0.25 mg total) by mouth 2 (two) times daily as needed for anxiety. (Patient taking differently: Take 0.25 mg by mouth 4 (four) times daily as needed for anxiety. ) 30 tablet 0  . Calcium Carb-Cholecalciferol (CALCIUM-VITAMIN D) 500-200 MG-UNIT tablet Take by mouth.    . cholecalciferol (VITAMIN  D) 1000 units tablet Take 2,000 Units by mouth daily.    [name] DEXILANT 60 MG capsule TAKE 1 TABLET BY MOUTH DAILY EVERY MORNING. 90 capsule 2  . EPINEPHrine (EPIPEN 2-PAK) 0.3  mg/0.3 mL IJ SOAJ injection Inject 0.3 mg into the muscle once as needed (for severe allergic reaction).    . fluorometholone (FML) 0.1 % ophthalmic suspension Place 1 drop into the left eye daily.     . folic acid (FOLVITE) 388 MCG tablet Take 800 mcg by mouth daily.    [name] gabapentin (NEURONTIN) 400 MG capsule Take 2 capsules (800 mg total) by mouth 2 (two) times daily. 60 capsule 0  . hydroxypropyl methylcellulose / hypromellose (ISOPTO TEARS / GONIOVISC) 2.5 % ophthalmic solution Place 1-2 drops into the right eye as needed for dry eyes.     [name] sertraline (ZOLOFT) 100 MG tablet Take 100 mg by mouth daily.     . verapamil (CALAN-SR) 240 MG CR tablet Take 240 mg by mouth daily.     No current facility-administered medications for this visit.   Facility-Administered Medications Ordered in Other Visits  Medication Dose Route Frequency Provider Last Rate Last Admin  . 0.9 %  sodium chloride infusion   Intravenous Continuous Sophia Lark, MD   Stopped at 06/05/19 1648     REVIEW OF SYSTEMS:   Constitutional: Denies fevers, [name], nose, mouth, throat, and face: Denies mucositis or sore throat Cardiovascular: Denies palpitation, [name], [name], [name], no new changes  All other systems were reviewed with the patient and are negative.  PHYSICAL EXAMINATION: ECOG PERFORMANCE STATUS: 2 - Symptomatic, <50% confined to bed  Vitals:   06/05/19 1439  BP: 130/64  Pulse: 87  Resp: 18  Temp: 98.7 F (37.1 C)  SpO2: 97%   Filed Weights   06/05/19 1439  Weight: 187 lb (84.8 kg)    GENERAL:alert, no distress and comfortable.  Limited examination is performed and she is  sitting on the wheelchair.  Oxygen in situ SKIN: skin color, texture, turgor are normal, [name], [name], sclera clear OROPHARYNX:no exudate, no erythema and lips, buccal mucosa, and tongue normal  NECK: supple, thyroid normal size, non-tender, without nodularity LYMPH:  no palpable lymphadenopathy in the cervical, axillary or inguinal LUNGS: clear to auscultation and percussion with normal breathing effort HEART: regular rate & rhythm with loud systolic murmurs and no lower extremity edema ABDOMEN:abdomen soft, non-tender and normal bowel sounds Musculoskeletal:no cyanosis of digits and no clubbing  NEURO: alert & oriented x 3 with fluent speech, no focal motor/sensory deficits  LABORATORY DATA:  I have reviewed the data as listed     Component Value Date/Time   NA 138 12/02/2018 1348   NA 139 03/23/2016 1448   K 4.5 12/02/2018 1348   K 4.0 03/23/2016 1448   CL 102 12/02/2018 1348   CO2 25 12/02/2018 1348   CO2 23 03/23/2016 1448   GLUCOSE 82 12/02/2018 1348   GLUCOSE 94 03/23/2016 1448   BUN 30 (H) 12/02/2018 1348   BUN 28.5 (H) 03/23/2016 1448   CREATININE 1.38 (H) 12/02/2018 1348   CREATININE 1.4 (H) 03/23/2016 1448   CALCIUM 9.0 12/02/2018 1348   CALCIUM 10.7 (  H) 03/23/2016 1448   PROT 6.6 12/02/2018 1348   PROT 6.6 03/23/2016 1448   ALBUMIN 3.9 12/02/2018 1348   ALBUMIN 3.7 03/23/2016 1448   AST 13 (L) 12/02/2018 1348   AST 16 03/23/2016 1448   ALT 8 12/02/2018 1348   ALT 10 03/23/2016 1448   ALKPHOS 54 12/02/2018 1348   ALKPHOS 76 03/23/2016 1448   BILITOT 0.4 12/02/2018 1348   BILITOT 0.29 03/23/2016 1448   GFRNONAA 34 (L) 12/02/2018 1348   GFRAA 39 (L) 12/02/2018 1348    No results found for: SPEP, UPEP  Lab Results  Component Value Date   WBC 4.2 06/05/2019   NEUTROABS 3.2 06/05/2019   HGB 11.3 (L) 06/05/2019   HCT 35.0 (L) 06/05/2019   MCV 96.4 06/05/2019   PLT 141 (L) 06/05/2019       Chemistry      Component Value Date/Time   NA 138 12/02/2018 1348   NA 139 03/23/2016 1448   K 4.5 12/02/2018 1348   K 4.0 03/23/2016 1448   CL 102 12/02/2018 1348   CO2 25 12/02/2018 1348   CO2 23 03/23/2016 1448   BUN 30 (H) 12/02/2018 1348   BUN 28.5 (H) 03/23/2016 1448   CREATININE 1.38 (H) 12/02/2018 1348   CREATININE 1.4 (H) 03/23/2016 1448      Component Value Date/Time   CALCIUM 9.0 12/02/2018 1348   CALCIUM 10.7 (H) 03/23/2016 1448   ALKPHOS 54 12/02/2018 1348   ALKPHOS 76 03/23/2016 1448   AST 13 (L) 12/02/2018 1348   AST 16 03/23/2016 1448   ALT 8 12/02/2018 1348   ALT 10 03/23/2016 1448   BILITOT 0.4 12/02/2018 1348   BILITOT 0.29 03/23/2016 1448

## 2019-06-05 NOTE — Patient Instructions (Signed)
Ferumoxytol injection What is this medicine? FERUMOXYTOL is an iron complex. Iron is used to make healthy red blood cells, which carry oxygen and nutrients throughout the body. This medicine is used to treat iron deficiency anemia. This medicine may be used for other purposes; ask your health care provider or pharmacist if you have questions. COMMON BRAND NAME(S): Feraheme What should I tell my health care provider before I take this medicine? They need to know if you have any of these conditions:  anemia not caused by low iron levels  high levels of iron in the blood  magnetic resonance imaging (MRI) test scheduled  an unusual or allergic reaction to iron, other medicines, foods, dyes, or preservatives  pregnant or trying to get pregnant  breast-feeding How should I use this medicine? This medicine is for injection into a vein. It is given by a health care professional in a hospital or clinic setting. Talk to your pediatrician regarding the use of this medicine in children. Special care may be needed. Overdosage: If you think you have taken too much of this medicine contact a poison control center or emergency room at once. NOTE: This medicine is only for you. Do not share this medicine with others. What if I miss a dose? It is important not to miss your dose. Call your doctor or health care professional if you are unable to keep an appointment. What may interact with this medicine? This medicine may interact with the following medications:  other iron products This list may not describe all possible interactions. Give your health care provider a list of all the medicines, herbs, non-prescription drugs, or dietary supplements you use. Also tell them if you smoke, drink alcohol, or use illegal drugs. Some items may interact with your medicine. What should I watch for while using this medicine? Visit your doctor or healthcare professional regularly. Tell your doctor or healthcare  professional if your symptoms do not start to get better or if they get worse. You may need blood work done while you are taking this medicine. You may need to follow a special diet. Talk to your doctor. Foods that contain iron include: whole grains/cereals, dried fruits, beans, or peas, leafy green vegetables, and organ meats (liver, kidney). What side effects may I notice from receiving this medicine? Side effects that you should report to your doctor or health care professional as soon as possible:  allergic reactions like skin rash, itching or hives, swelling of the face, lips, or tongue  breathing problems  changes in blood pressure  feeling faint or lightheaded, falls  fever or chills  flushing, sweating, or hot feelings  swelling of the ankles or feet Side effects that usually do not require medical attention (report to your doctor or health care professional if they continue or are bothersome):  diarrhea  headache  nausea, vomiting  stomach pain This list may not describe all possible side effects. Call your doctor for medical advice about side effects. You may report side effects to FDA at 1-800-FDA-1088. Where should I keep my medicine? This drug is given in a hospital or clinic and will not be stored at home. NOTE: This sheet is a summary. It may not cover all possible information. If you have questions about this medicine, talk to your doctor, pharmacist, or health care provider.  2020 Elsevier/Gold Standard (2016-03-12 20:21:10) Coronavirus (COVID-19) Are you at risk?  Are you at risk for the Coronavirus (COVID-19)?  To be considered HIGH RISK for Coronavirus (COVID-19),   you have to meet the following criteria:  . Traveled to [place], [place], [place], [place] or [place]; or in the [place] to [place], [place], [place], or [place]; and have fever, cough, and shortness of breath within the last 2 weeks of travel OR . Been in close contact with a person  diagnosed with COVID-19 within the last 2 weeks and have fever, cough, and shortness of breath . IF YOU DO NOT MEET THESE CRITERIA, YOU ARE CONSIDERED LOW RISK FOR COVID-19.  What to do if you are HIGH RISK for COVID-19?  . If you are having a medical emergency, call 911. . Seek medical care right away. Before you go to a doctor's office, urgent care or emergency department, call ahead and tell them about your recent travel, contact with someone diagnosed with COVID-19, and your symptoms. You should receive instructions from your physician's office regarding next steps of care.  . When you arrive at healthcare provider, tell the healthcare staff immediately you have returned from visiting [place], [place], [place], [place] or [place]; or traveled in the [place] to [place], [place], [place], or [place]; in the last two weeks or you have been in close contact with a person diagnosed with COVID-19 in the last 2 weeks.   . Tell the health care staff about your symptoms: fever, cough and shortness of breath. . After you have been seen by a medical provider, you will be either: o Tested for (COVID-19) and discharged home on quarantine except to seek medical care if symptoms worsen, and asked to  - Stay home and avoid contact with others until you get your results (4-5 days)  - Avoid travel on public transportation if possible (such as bus, train, or airplane) or o Sent to the Emergency Department by EMS for evaluation, COVID-19 testing, and possible admission depending on your condition and test results.  What to do if you are LOW RISK for COVID-19?  Reduce your risk of any infection by using the same precautions used for avoiding the common cold or flu:  . Wash your hands often with soap and warm water for at least 20 seconds.  If soap and water are not readily available, use an alcohol-based hand sanitizer with at least 60% alcohol.  . If coughing or sneezing, cover your mouth and nose by  coughing or sneezing into the elbow areas of your shirt or coat, into a tissue or into your sleeve (not your hands). . Avoid shaking hands with others and consider head nods or verbal greetings only. . Avoid touching your eyes, nose, or mouth with unwashed hands.  . Avoid close contact with people who are sick. . Avoid places or events with large numbers of people in one location, like concerts or sporting events. . Carefully consider travel plans you have or are making. . If you are planning any travel outside or inside the [place], visit the CDC's Travelers' Health webpage for the latest health notices. . If you have some symptoms but not all symptoms, continue to monitor at home and seek medical attention if your symptoms worsen. . If you are having a medical emergency, call 911.   ADDITIONAL HEALTHCARE OPTIONS FOR PATIENTS  [facility] / e-Visit: [email or web address]         [facility]: [phone]  [facility]: [phone]                   [facility]: [phone]   

## 2019-06-08 LAB — FERRITIN: Ferritin: 238 ng/mL (ref 11–307)

## 2019-06-09 ENCOUNTER — Telehealth: Payer: Self-pay | Admitting: Hematology and Oncology

## 2019-06-09 NOTE — Telephone Encounter (Signed)
Scheduled appts per 4/30 sch msg. Pt's son confirmed appt date and time

## 2019-10-21 ENCOUNTER — Other Ambulatory Visit: Payer: Self-pay | Admitting: Hematology and Oncology

## 2019-10-21 DIAGNOSIS — N183 Chronic kidney disease, stage 3 unspecified: Secondary | ICD-10-CM

## 2019-10-21 DIAGNOSIS — E559 Vitamin D deficiency, unspecified: Secondary | ICD-10-CM

## 2019-10-22 ENCOUNTER — Other Ambulatory Visit: Payer: Self-pay

## 2019-10-22 DIAGNOSIS — D539 Nutritional anemia, unspecified: Secondary | ICD-10-CM

## 2019-10-23 ENCOUNTER — Other Ambulatory Visit: Payer: Self-pay | Admitting: Hematology and Oncology

## 2019-10-23 DIAGNOSIS — E785 Hyperlipidemia, unspecified: Secondary | ICD-10-CM

## 2019-10-27 ENCOUNTER — Encounter: Payer: Self-pay | Admitting: Hematology and Oncology

## 2019-10-27 ENCOUNTER — Other Ambulatory Visit: Payer: Self-pay

## 2019-10-27 ENCOUNTER — Inpatient Hospital Stay: Payer: Medicare Other | Attending: Hematology and Oncology

## 2019-10-27 ENCOUNTER — Other Ambulatory Visit: Payer: Medicare Other

## 2019-10-27 ENCOUNTER — Inpatient Hospital Stay: Payer: Medicare Other

## 2019-10-27 ENCOUNTER — Inpatient Hospital Stay (HOSPITAL_BASED_OUTPATIENT_CLINIC_OR_DEPARTMENT_OTHER): Payer: Medicare Other | Admitting: Hematology and Oncology

## 2019-10-27 ENCOUNTER — Telehealth: Payer: Self-pay | Admitting: Hematology and Oncology

## 2019-10-27 ENCOUNTER — Ambulatory Visit: Payer: Medicare Other

## 2019-10-27 ENCOUNTER — Ambulatory Visit: Payer: Medicare Other | Admitting: Hematology and Oncology

## 2019-10-27 VITALS — BP 118/61 | HR 81 | Resp 18

## 2019-10-27 DIAGNOSIS — D509 Iron deficiency anemia, unspecified: Secondary | ICD-10-CM

## 2019-10-27 DIAGNOSIS — E559 Vitamin D deficiency, unspecified: Secondary | ICD-10-CM

## 2019-10-27 DIAGNOSIS — Z23 Encounter for immunization: Secondary | ICD-10-CM

## 2019-10-27 DIAGNOSIS — Z862 Personal history of diseases of the blood and blood-forming organs and certain disorders involving the immune mechanism: Secondary | ICD-10-CM | POA: Insufficient documentation

## 2019-10-27 DIAGNOSIS — D539 Nutritional anemia, unspecified: Secondary | ICD-10-CM

## 2019-10-27 DIAGNOSIS — N183 Chronic kidney disease, stage 3 unspecified: Secondary | ICD-10-CM

## 2019-10-27 DIAGNOSIS — Z299 Encounter for prophylactic measures, unspecified: Secondary | ICD-10-CM

## 2019-10-27 DIAGNOSIS — E785 Hyperlipidemia, unspecified: Secondary | ICD-10-CM

## 2019-10-27 DIAGNOSIS — D500 Iron deficiency anemia secondary to blood loss (chronic): Secondary | ICD-10-CM

## 2019-10-27 LAB — CBC WITH DIFFERENTIAL/PLATELET
Abs Immature Granulocytes: 0.02 10*3/uL (ref 0.00–0.07)
Basophils Absolute: 0 10*3/uL (ref 0.0–0.1)
Basophils Relative: 0 %
Eosinophils Absolute: 0.1 10*3/uL (ref 0.0–0.5)
Eosinophils Relative: 2 %
HCT: 37.4 % (ref 36.0–46.0)
Hemoglobin: 12.1 g/dL (ref 12.0–15.0)
Immature Granulocytes: 1 %
Lymphocytes Relative: 13 %
Lymphs Abs: 0.6 10*3/uL — ABNORMAL LOW (ref 0.7–4.0)
MCH: 31.3 pg (ref 26.0–34.0)
MCHC: 32.4 g/dL (ref 30.0–36.0)
MCV: 96.9 fL (ref 80.0–100.0)
Monocytes Absolute: 0.4 10*3/uL (ref 0.1–1.0)
Monocytes Relative: 10 %
Neutro Abs: 3.2 10*3/uL (ref 1.7–7.7)
Neutrophils Relative %: 74 %
Platelets: 146 10*3/uL — ABNORMAL LOW (ref 150–400)
RBC: 3.86 MIL/uL — ABNORMAL LOW (ref 3.87–5.11)
RDW: 13.1 % (ref 11.5–15.5)
WBC: 4.3 10*3/uL (ref 4.0–10.5)
nRBC: 0 % (ref 0.0–0.2)

## 2019-10-27 LAB — IRON AND TIBC
Iron: 78 ug/dL (ref 41–142)
Saturation Ratios: 33 % (ref 21–57)
TIBC: 234 ug/dL — ABNORMAL LOW (ref 236–444)
UIBC: 156 ug/dL (ref 120–384)

## 2019-10-27 LAB — COMPREHENSIVE METABOLIC PANEL
ALT: 6 U/L (ref 0–44)
AST: 13 U/L — ABNORMAL LOW (ref 15–41)
Albumin: 3.9 g/dL (ref 3.5–5.0)
Alkaline Phosphatase: 49 U/L (ref 38–126)
Anion gap: 6 (ref 5–15)
BUN: 34 mg/dL — ABNORMAL HIGH (ref 8–23)
CO2: 27 mmol/L (ref 22–32)
Calcium: 9 mg/dL (ref 8.9–10.3)
Chloride: 104 mmol/L (ref 98–111)
Creatinine, Ser: 1.28 mg/dL — ABNORMAL HIGH (ref 0.44–1.00)
GFR calc Af Amer: 43 mL/min — ABNORMAL LOW (ref >60)
GFR calc non Af Amer: 37 mL/min — ABNORMAL LOW (ref >60)
Glucose, Bld: 79 mg/dL (ref 70–99)
Potassium: 4.4 mmol/L (ref 3.5–5.1)
Sodium: 137 mmol/L (ref 135–145)
Total Bilirubin: 0.4 mg/dL (ref 0.3–1.2)
Total Protein: 6.8 g/dL (ref 6.5–8.1)

## 2019-10-27 LAB — LIPID PANEL
Cholesterol: 222 mg/dL — ABNORMAL HIGH (ref 0–200)
HDL: 46 mg/dL (ref >40)
LDL Cholesterol: 125 mg/dL — ABNORMAL HIGH (ref 0–99)
Total CHOL/HDL Ratio: 4.8 RATIO
Triglycerides: 254 mg/dL — ABNORMAL HIGH (ref <150)
VLDL: 51 mg/dL — ABNORMAL HIGH (ref 0–40)

## 2019-10-27 LAB — FERRITIN: Ferritin: 858 ng/mL — ABNORMAL HIGH (ref 11–307)

## 2019-10-27 LAB — VITAMIN D 25 HYDROXY (VIT D DEFICIENCY, FRACTURES): Vit D, 25-Hydroxy: 30.39 ng/mL (ref 30–100)

## 2019-10-27 LAB — TSH: TSH: 1.362 u[IU]/mL (ref 0.308–3.960)

## 2019-10-27 MED ORDER — INFLUENZA VAC A&B SA ADJ QUAD 0.5 ML IM PRSY
PREFILLED_SYRINGE | INTRAMUSCULAR | Status: AC
  Filled 2019-10-27: qty 0.5

## 2019-10-27 MED ORDER — INFLUENZA VAC A&B SA ADJ QUAD 0.5 ML IM PRSY
0.5000 mL | PREFILLED_SYRINGE | Freq: Once | INTRAMUSCULAR | Status: AC
  Administered 2019-10-27: 0.5 mL via INTRAMUSCULAR

## 2019-10-27 NOTE — Assessment & Plan Note (Signed)
We discussed the importance of preventive care and reviewed the vaccination programs. She does not have any prior allergic reactions to influenza vaccination. She agrees to proceed with influenza vaccination today and we will administer it today at the clinic.  

## 2019-10-27 NOTE — Telephone Encounter (Signed)
Scheduled appts per 9/21 sch msg. Gave pt a print out of AVS.

## 2019-10-27 NOTE — Assessment & Plan Note (Signed)
Her iron study is still pending She is not anemic She does not need IV iron today We will cancel her appointment I recommend seeing her back again in March for further follow-up The patient and her son are educated to watch for signs and symptoms of recurrent anemia

## 2019-10-27 NOTE — Progress Notes (Signed)
[facility] OFFICE PROGRESS NOTE  Sophia Pretty, MD  ASSESSMENT & PLAN:  Iron deficiency anemia due to chronic blood loss Her iron study is still pending She is not anemic She does not need IV iron today We will cancel her appointment I recommend seeing her back again in March for further follow-up The patient and her son are educated to watch for signs and symptoms of recurrent anemia  Preventive measure We discussed the importance of preventive care and reviewed the vaccination programs. She does not have any prior allergic reactions to influenza vaccination. She agrees to proceed with influenza vaccination today and we will administer it today at the clinic.    No orders of the defined types were placed in this encounter.   The total time spent in the appointment was 15 minutes encounter with patients including review of chart and various tests results, discussions about plan of care and coordination of care plan   All questions were answered. The patient knows to call the clinic with any problems, questions or concerns. No barriers to learning was detected.    Sophia Lark, MD 9/21/20212:24 PM  INTERVAL HISTORY: Sophia [name] 86 y.o. female returns for recurrent iron deficiency anemia She is here with her son She is feeling well Denies recent shortness of breath or dizziness The patient denies any recent signs or symptoms of bleeding such as spontaneous epistaxis, hematuria or hematochezia. No pica   SUMMARY OF HEMATOLOGIC HISTORY:  She was found to have abnormal CBC from recent blood count monitoring. The patient have chronic iron deficiency anemia due to recurrent GI bleed. 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 On 09/30/2013, bone marrow biopsy was done which showed no evidence to suggest myelodysplastic syndrome From July 2015 to present, she has received numerous intravenous iron infusion On  07/08/2014, repeat endoscopy revealed multiple AVMs and GAVE and she have repeat ablation therapy She was admitted to the hospital in December 2017 due to severe anemia. She received blood transfusion and iron treatment Since February 2018, she received intravenous iron every 4- 6 weeks Starting in 2019 to 2021, she has reduced intravenous iron requirement  I have reviewed the past medical history, past surgical history, [name], risedronate sodium, fosamax [alendronate sodium], lipitor [atorvastatin], pregabalin, tolterodine tartrate, and wasp venom.  MEDICATIONS:  Current Outpatient Medications  Medication Sig Dispense Refill  . acetaminophen (TYLENOL) 500 MG tablet Take 500 mg by mouth 2 (two) times daily as needed for mild pain or fever.    [name] albuterol (PROVENTIL HFA;VENTOLIN HFA) 108 (90 Base) MCG/ACT inhaler Inhale 1-2 puffs into the lungs every 6 (six) hours as needed for wheezing or shortness of breath. 1 Inhaler 0  . ALPRAZolam (XANAX) 0.25 MG tablet Take 1 tablet (0.25 mg total) by mouth 2 (two) times daily as needed for anxiety. (Patient taking differently: Take 0.25 mg by mouth 4 (four) times daily as needed for anxiety. ) 30 tablet 0  . Calcium Carb-Cholecalciferol (CALCIUM-VITAMIN D) 500-200 MG-UNIT tablet Take by mouth.    . cholecalciferol (VITAMIN D) 1000 units tablet Take 2,000 Units by mouth daily.    [name] DEXILANT 60 MG capsule TAKE 1 TABLET BY MOUTH DAILY EVERY MORNING. 90 capsule 2  . EPINEPHrine (EPIPEN 2-PAK) 0.3 mg/0.3 mL IJ SOAJ injection Inject 0.3 mg into the muscle once as  needed (for severe allergic reaction).    . fluorometholone (FML) 0.1 % ophthalmic suspension Place 1 drop into the left eye daily.     . folic acid (FOLVITE) 967 MCG tablet Take 800 mcg by mouth daily.    [name] gabapentin (NEURONTIN) 400 MG capsule Take 2 capsules (800  mg total) by mouth 2 (two) times daily. 60 capsule 0  . hydroxypropyl methylcellulose / hypromellose (ISOPTO TEARS / GONIOVISC) 2.5 % ophthalmic solution Place 1-2 drops into the right eye as needed for dry eyes.     [name] sertraline (ZOLOFT) 100 MG tablet Take 100 mg by mouth daily.     . verapamil (CALAN-SR) 240 MG CR tablet Take 240 mg by mouth daily.     Current Facility-Administered Medications  Medication Dose Route Frequency Provider Last Rate Last Admin  . influenza vaccine adjuvanted (FLUAD) injection 0.5 mL  0.5 mL Intramuscular Once Sophia Lark, MD         REVIEW OF SYSTEMS:   Constitutional: Denies fevers, [name], nose, mouth, throat, [name], [name], [name], [name], [name], no new changes  All other systems were reviewed with the patient and are negative.  PHYSICAL EXAMINATION: ECOG PERFORMANCE STATUS: 2 - Symptomatic, <50% confined to bed  Vitals:   10/27/19 1421  BP: 118/61  Pulse: 81  Resp: 18  SpO2: 97%   There were no vitals filed for this visit.  GENERAL:alert, no distress and comfortable.  She uses chronic oxygen NEURO: alert & oriented x 3 with fluent speech, no focal motor/sensory deficits  LABORATORY DATA:  I have reviewed the data as listed     Component Value Date/Time   NA 138 12/02/2018 1348   NA 139 03/23/2016 1448   K 4.5 12/02/2018 1348   K 4.0 03/23/2016 1448   CL 102 12/02/2018 1348   CO2 25 12/02/2018 1348   CO2 23 03/23/2016 1448   GLUCOSE 82 12/02/2018 1348   GLUCOSE 94 03/23/2016 1448   BUN 30 (H) 12/02/2018  1348   BUN 28.5 (H) 03/23/2016 1448   CREATININE 1.38 (H) 12/02/2018 1348   CREATININE 1.4 (H) 03/23/2016 1448   CALCIUM 9.0 12/02/2018 1348   CALCIUM 10.7 (H) 03/23/2016 1448   PROT 6.6 12/02/2018 1348   PROT 6.6 03/23/2016 1448   ALBUMIN 3.9 12/02/2018 1348   ALBUMIN 3.7 03/23/2016 1448   AST 13 (L) 12/02/2018 1348   AST 16 03/23/2016 1448   ALT 8 12/02/2018 1348   ALT 10 03/23/2016 1448   ALKPHOS 54 12/02/2018 1348   ALKPHOS 76 03/23/2016 1448   BILITOT 0.4 12/02/2018 1348   BILITOT 0.29 03/23/2016 1448   GFRNONAA 34 (L) 12/02/2018 1348   GFRAA 39 (L) 12/02/2018 1348    No results found for: SPEP, UPEP  Lab Results  Component Value Date   WBC 4.3 10/27/2019   NEUTROABS 3.2 10/27/2019   HGB 12.1 10/27/2019   HCT 37.4 10/27/2019   MCV 96.9 10/27/2019   PLT 146 (L) 10/27/2019      Chemistry      Component Value Date/Time   NA 138 12/02/2018 1348   NA 139 03/23/2016 1448   K 4.5 12/02/2018  1348   K 4.0 03/23/2016 1448   CL 102 12/02/2018 1348   CO2 25 12/02/2018 1348   CO2 23 03/23/2016 1448   BUN 30 (H) 12/02/2018 1348   BUN 28.5 (H) 03/23/2016 1448   CREATININE 1.38 (H) 12/02/2018 1348   CREATININE 1.4 (H) 03/23/2016 1448      Component Value Date/Time   CALCIUM 9.0 12/02/2018 1348   CALCIUM 10.7 (H) 03/23/2016 1448   ALKPHOS 54 12/02/2018 1348   ALKPHOS 76 03/23/2016 1448   AST 13 (L) 12/02/2018 1348   AST 16 03/23/2016 1448   ALT 8 12/02/2018 1348   ALT 10 03/23/2016 1448   BILITOT 0.4 12/02/2018 1348   BILITOT 0.29 03/23/2016 1448

## 2019-10-28 ENCOUNTER — Telehealth: Payer: Self-pay

## 2019-10-28 NOTE — Telephone Encounter (Signed)
Called and given below message to son. He verbalized understanding. Copy of labs requested from [facility] faxed to [phone]. Received confirmation.

## 2019-10-28 NOTE — Telephone Encounter (Signed)
-----   Message from [name], MD sent at 10/27/2019  4:05 PM EDT ----- Regarding: pls call her son and let him know iron panel is good. cholesterol is high but will defer to PCP to discuss

## 2019-12-01 DIAGNOSIS — Z23 Encounter for immunization: Secondary | ICD-10-CM | POA: Diagnosis not present

## 2020-04-08 ENCOUNTER — Telehealth: Payer: Self-pay | Admitting: Adult Health

## 2020-04-08 NOTE — Telephone Encounter (Signed)
TP pt has appt with you on 03/07 at 3 pm and would like to see if this can be changed to a televisit.  She is worried about catching something coming into the office.  Please advise. Thanks

## 2020-04-08 NOTE — Telephone Encounter (Signed)
Called and spoke with pts son, [name] and he stated that he will bring her in since it is for the oxygen.  He stated that Dr. Shelia Media sent a script in but they are asking for more information.   Nothing further is needed.

## 2020-04-08 NOTE — Telephone Encounter (Signed)
That is fine but if she is for an oxygen qualification we will be able to do her oxygen qualification because she has to have her oxygen checked in order to qualify

## 2020-04-11 ENCOUNTER — Encounter: Payer: Self-pay | Admitting: Adult Health

## 2020-04-11 ENCOUNTER — Ambulatory Visit (INDEPENDENT_AMBULATORY_CARE_PROVIDER_SITE_OTHER): Payer: Medicare Other | Admitting: Adult Health

## 2020-04-11 ENCOUNTER — Other Ambulatory Visit: Payer: Self-pay

## 2020-04-11 DIAGNOSIS — J9611 Chronic respiratory failure with hypoxia: Secondary | ICD-10-CM

## 2020-04-11 NOTE — Progress Notes (Signed)
@Patient  ID: Sophia [name], female    DOB: 07/12/1928, 87 y.o.   MRN: [id number]  Chief Complaint  Patient presents with  . Follow-up    Referring provider: [name], MD  HPI: 87 year old former smoker female followed for chronic respiratory failure on oxygen Medical history significant for congestive heart failure and chronic anemia (chronic AVMs and GAVE)   TEST/EVENTS :   04/11/2020 Follow up : O2 RF  Patient returns for a follow-up visit.  Last seen February 2019.  At that time patient was seen for a visit post hospital for congestive heart failure.  Patient is on 2 L of oxygen.  She is a former smoker.   Since last visit patient is doing she is doing well.  Her son lives with her at home and is her caregiver.  Patient is limited due to chronic medical problems including arthritis in her knees it makes it hard for her to get around.  She is on oxygen 2 L at all times.  Patient does very well on this.  At home O2 saturations do drop in the 80s if she takes off her oxygen.  On oxygen she remains in the 90s.  O2 saturations currently are 85% on room air in the office.  Placed on oxygen at 2 L patient's O2 saturations are greater than 90%.  Patient denies any cough.  Does have shortness of breath with activity .  Patient has no known history of asthma or COPD.  No previous PFTs on record.  Denies any increased leg swelling  Allergies  Allergen Reactions  . Nutritional Supplements Anaphylaxis  . [name] Shortness Of Breath  . [name] [Alendronate Sodium] Other (See Comments)    Reaction:  GI upset   . Lipitor [Atorvastatin] Other (See Comments)    Reaction:  Muscle aches   . Pregabalin Nausea And Vomiting  . [name] Nausea And Vomiting  . Wasp Venom Hives    Immunization History  Administered Date(s) Administered  . Fluad Quad(high Dose 65+) 10/27/2019  . Influenza Split 10/13/2012, 10/07/2014, 10/05/2015, 10/30/2016  . Influenza Whole 12/05/2004   . Influenza, High Dose Seasonal PF 10/28/2017  . Influenza,inj,Quad PF,6+ Mos 10/09/2013  . Influenza-Unspecified 10/29/2018  . PFIZER(Purple Top)SARS-COV-2 Vaccination 02/11/2019, 03/04/2019, 11/25/2019  . Pneumococcal Conjugate-13 08/11/2013  . Pneumococcal Polysaccharide-23 11/06/2002  . Pneumococcal-Unspecified 05/01/2010  . Td 05/07/2002  . Tdap 07/16/2012  . Zoster Recombinat (Shingrix) 10/29/2018    Past Medical History:  Diagnosis Date  . Anemia   . Anxiety   . Aortic stenosis   . Arthritis   . Blood transfusion   . Cataracts, bilateral    removed  . Congestive heart failure ([place])   . Depression   . Diverticulosis 03/14/10  . Duodenitis 03/15/10   per capsule endoscopy report  . DVT (deep venous thrombosis) ([place])    pt denies  . Dyslipidemia   . GAVE (gastric antral vascular ectasia)   . GERD (gastroesophageal reflux disease)   . Heart murmur   . Hemorrhagic gastritis 03/15/10   per capsule endoscopy report  . Hiatal hernia   . Hyperlipidemia   . Hyperplastic colon polyp   . Hypertension   . Neuropathy    bil big toes  . Osteoporosis   . Presbyesophagus   . Unspecified deficiency anemia 08/24/2013   Iron infusion     Tobacco History: Social History   Tobacco Use  Smoking Status Former Smoker  . Packs/day: 0.50  . Years: 20.00  .  Pack years: 10.00  . Types: Cigarettes  . Quit date: 01/08/1989  . Years since quitting: 31.2  Smokeless Tobacco Never Used   Counseling given: Not Answered   Outpatient Medications Prior to Visit  Medication Sig Dispense Refill  . acetaminophen (TYLENOL) 500 MG tablet Take 500 mg by mouth 2 (two) times daily as needed for mild pain or fever.    [name] albuterol (PROVENTIL HFA;VENTOLIN HFA) 108 (90 Base) MCG/ACT inhaler Inhale 1-2 puffs into the lungs every 6 (six) hours as needed for wheezing or shortness of breath. 1 Inhaler 0  . ALPRAZolam (XANAX) 0.25 MG tablet Take 1 tablet (0.25 mg total) by mouth 2 (two) times daily as  needed for anxiety. (Patient taking differently: Take 0.25 mg by mouth 4 (four) times daily as needed for anxiety.) 30 tablet 0  . Calcium Carb-Cholecalciferol (CALCIUM-VITAMIN D) 500-200 MG-UNIT tablet Take by mouth.    . cholecalciferol (VITAMIN D) 1000 units tablet Take 2,000 Units by mouth daily.    [name] DEXILANT 60 MG capsule TAKE 1 TABLET BY MOUTH DAILY EVERY MORNING. 90 capsule 2  . EPINEPHrine 0.3 mg/0.3 mL IJ SOAJ injection Inject 0.3 mg into the muscle once as needed (for severe allergic reaction).    . fluorometholone (FML) 0.1 % ophthalmic suspension Place 1 drop into the left eye daily.     . folic acid (FOLVITE) 109 MCG tablet Take 800 mcg by mouth daily.    [name] gabapentin (NEURONTIN) 400 MG capsule Take 2 capsules (800 mg total) by mouth 2 (two) times daily. 60 capsule 0  . hydroxypropyl methylcellulose / hypromellose (ISOPTO TEARS / GONIOVISC) 2.5 % ophthalmic solution Place 1-2 drops into the right eye as needed for dry eyes.     [name] sertraline (ZOLOFT) 100 MG tablet Take 100 mg by mouth daily.     . verapamil (CALAN-SR) 240 MG CR tablet Take 240 mg by mouth daily.     No facility-administered medications prior to visit.     Review of Systems:   Constitutional:   No  weight loss, night sweats,  Fevers, chills, + fatigue, or  lassitude.  HEENT:   No headaches,  Difficulty swallowing,  Tooth/dental problems, or  Sore throat,                No sneezing, itching, ear ache, nasal congestion, post nasal drip,   CV:  No chest pain,  Orthopnea, PND, swelling in lower extremities, anasarca, dizziness, palpitations, syncope.   GI  No heartburn, indigestion, abdominal pain, nausea, vomiting, diarrhea, change in bowel habits, loss of appetite, bloody stools.   Resp:   No excess mucus, no productive cough,  No non-productive cough,  No coughing up of blood.  No change in color of mucus.  No wheezing.  No chest wall deformity  Skin: no rash or lesions.  GU: no dysuria, change in color of  urine, no urgency or frequency.  No flank pain, no hematuria   MS: Chronic knee pain   Physical Exam  BP 118/72 (BP Location: Right Arm, Patient Position: Sitting, Cuff Size: Normal)   Pulse 75   Temp 97.8 F (36.6 C) (Skin)   Ht 5\' 3"  (1.6 m)   SpO2 92% Comment: 2 L [place] pulsed  BMI 33.13 kg/m   GEN: A/Ox3; pleasant , NAD, elderly, on O2 in wheelchair   HEENT:  [place]/AT,  , NOSE-clear, THROAT-clear, no lesions, no postnasal drip or exudate noted.   NECK:  Supple w/ fair ROM; no JVD;  normal carotid impulses w/o bruits; no thyromegaly or nodules palpated; no lymphadenopathy.    RESP  Clear  P & A; w/o, wheezes/ rales/ or rhonchi. no accessory muscle use, no dullness to percussion  CARD:  RRR, no m/r/g, no peripheral edema, pulses intact, no cyanosis or clubbing.  GI:   Soft & nt; nml bowel sounds; no organomegaly or masses detected.   Musco: Warm bil, no deformities or joint swelling noted.   Neuro: alert, no focal deficits noted.    Skin: Warm, no lesions or rashes    Lab Results:  CBC   BMET    ProBNP No results found for: PROBNP  Imaging: No results found.    No flowsheet data found.  No results found for: NITRICOXIDE      Assessment & Plan:   Respiratory failure, chronic (HCC) Chronic respiratory failure.  O2 saturations off of oxygen or 85%.  Requires oxygen at 2 L to maintain O2 sats greater than 90%.  Plan  Patient Instructions  Continue on Oxygen 2l/m  Activity as tolerated.  Follow up with Dr. Halford Chessman  In 1 year and As needed           Rexene Edison, NP 04/11/2020

## 2020-04-11 NOTE — Assessment & Plan Note (Signed)
Chronic respiratory failure.  O2 saturations off of oxygen or 85%.  Requires oxygen at 2 L to maintain O2 sats greater than 90%.  Plan  Patient Instructions  Continue on Oxygen 2l/m  Activity as tolerated.  Follow up with Dr. Halford Chessman  In 1 year and As needed

## 2020-04-11 NOTE — Patient Instructions (Signed)
Continue on Oxygen 2l/m  Activity as tolerated.  Follow up with Dr. Halford Chessman  In 1 year and As needed

## 2020-05-03 ENCOUNTER — Other Ambulatory Visit: Payer: Self-pay

## 2020-05-03 DIAGNOSIS — D539 Nutritional anemia, unspecified: Secondary | ICD-10-CM

## 2020-05-03 DIAGNOSIS — E785 Hyperlipidemia, unspecified: Secondary | ICD-10-CM

## 2020-05-03 DIAGNOSIS — Z299 Encounter for prophylactic measures, unspecified: Secondary | ICD-10-CM

## 2020-05-03 DIAGNOSIS — E559 Vitamin D deficiency, unspecified: Secondary | ICD-10-CM

## 2020-05-11 ENCOUNTER — Encounter: Payer: Self-pay | Admitting: Hematology and Oncology

## 2020-05-11 ENCOUNTER — Inpatient Hospital Stay: Payer: Medicare Other | Attending: Hematology and Oncology

## 2020-05-11 ENCOUNTER — Inpatient Hospital Stay (HOSPITAL_BASED_OUTPATIENT_CLINIC_OR_DEPARTMENT_OTHER): Payer: Medicare Other | Admitting: Hematology and Oncology

## 2020-05-11 ENCOUNTER — Inpatient Hospital Stay: Payer: Medicare Other

## 2020-05-11 ENCOUNTER — Other Ambulatory Visit: Payer: Self-pay

## 2020-05-11 DIAGNOSIS — D72819 Decreased white blood cell count, unspecified: Secondary | ICD-10-CM | POA: Diagnosis not present

## 2020-05-11 DIAGNOSIS — N183 Chronic kidney disease, stage 3 unspecified: Secondary | ICD-10-CM | POA: Insufficient documentation

## 2020-05-11 DIAGNOSIS — E785 Hyperlipidemia, unspecified: Secondary | ICD-10-CM

## 2020-05-11 DIAGNOSIS — D500 Iron deficiency anemia secondary to blood loss (chronic): Secondary | ICD-10-CM

## 2020-05-11 DIAGNOSIS — D539 Nutritional anemia, unspecified: Secondary | ICD-10-CM

## 2020-05-11 DIAGNOSIS — Z862 Personal history of diseases of the blood and blood-forming organs and certain disorders involving the immune mechanism: Secondary | ICD-10-CM | POA: Diagnosis not present

## 2020-05-11 DIAGNOSIS — E559 Vitamin D deficiency, unspecified: Secondary | ICD-10-CM

## 2020-05-11 DIAGNOSIS — Z299 Encounter for prophylactic measures, unspecified: Secondary | ICD-10-CM

## 2020-05-11 DIAGNOSIS — D509 Iron deficiency anemia, unspecified: Secondary | ICD-10-CM

## 2020-05-11 LAB — CBC WITH DIFFERENTIAL/PLATELET
Abs Immature Granulocytes: 0.01 10*3/uL (ref 0.00–0.07)
Basophils Absolute: 0 10*3/uL (ref 0.0–0.1)
Basophils Relative: 1 %
Eosinophils Absolute: 0.1 10*3/uL (ref 0.0–0.5)
Eosinophils Relative: 2 %
HCT: 36.7 % (ref 36.0–46.0)
Hemoglobin: 12.1 g/dL (ref 12.0–15.0)
Immature Granulocytes: 0 %
Lymphocytes Relative: 18 %
Lymphs Abs: 0.7 10*3/uL (ref 0.7–4.0)
MCH: 32.4 pg (ref 26.0–34.0)
MCHC: 33 g/dL (ref 30.0–36.0)
MCV: 98.1 fL (ref 80.0–100.0)
Monocytes Absolute: 0.4 10*3/uL (ref 0.1–1.0)
Monocytes Relative: 10 %
Neutro Abs: 2.7 10*3/uL (ref 1.7–7.7)
Neutrophils Relative %: 69 %
Platelets: 162 10*3/uL (ref 150–400)
RBC: 3.74 MIL/uL — ABNORMAL LOW (ref 3.87–5.11)
RDW: 13.3 % (ref 11.5–15.5)
WBC: 3.9 10*3/uL — ABNORMAL LOW (ref 4.0–10.5)
nRBC: 0 % (ref 0.0–0.2)

## 2020-05-11 LAB — IRON AND TIBC
Iron: 88 ug/dL (ref 41–142)
Saturation Ratios: 35 % (ref 21–57)
TIBC: 253 ug/dL (ref 236–444)
UIBC: 165 ug/dL (ref 120–384)

## 2020-05-11 LAB — VITAMIN D 25 HYDROXY (VIT D DEFICIENCY, FRACTURES): Vit D, 25-Hydroxy: 35.06 ng/mL (ref 30–100)

## 2020-05-11 LAB — LIPID PANEL
Cholesterol: 230 mg/dL — ABNORMAL HIGH (ref 0–200)
HDL: 55 mg/dL (ref >40)
LDL Cholesterol: 138 mg/dL — ABNORMAL HIGH (ref 0–99)
Total CHOL/HDL Ratio: 4.2 RATIO
Triglycerides: 187 mg/dL — ABNORMAL HIGH (ref <150)
VLDL: 37 mg/dL (ref 0–40)

## 2020-05-11 LAB — TSH: TSH: 1.914 u[IU]/mL (ref 0.308–3.960)

## 2020-05-11 LAB — FERRITIN: Ferritin: 643 ng/mL — ABNORMAL HIGH (ref 11–307)

## 2020-05-11 NOTE — Assessment & Plan Note (Signed)
She has mild intermittent chronic leukopenia for many years She is not symptomatic Observe closely

## 2020-05-11 NOTE — Assessment & Plan Note (Signed)
Her renal function is stable A copy of the result will be faxed to her primary care doctor

## 2020-05-11 NOTE — Assessment & Plan Note (Addendum)
Her iron study is still pending She is not anemic She does not need IV iron today We will cancel her appointment I recommend seeing her back again in 6 months for further follow-up The patient and her son are educated to watch for signs and symptoms of recurrent anemia

## 2020-05-11 NOTE — Progress Notes (Signed)
Sophia [name] OFFICE PROGRESS NOTE  Sophia Pretty, MD  ASSESSMENT & PLAN:  Iron deficiency anemia due to chronic blood loss Her iron study is still pending She is not anemic She does not need IV iron today We will cancel her appointment I recommend seeing her back again in 6 months for further follow-up The patient and her son are educated to watch for signs and symptoms of recurrent anemia  Chronic leukopenia She has mild intermittent chronic leukopenia for many years She is not symptomatic Observe closely  CKD (chronic kidney disease), stage III Her renal function is stable A copy of the result will be faxed to her primary care doctor   Orders Placed This Encounter  Procedures  . CBC with Differential/Platelet    Standing Status:   Standing    Number of Occurrences:   22    Standing Expiration Date:   05/11/2021  . Ferritin    Standing Status:   Standing    Number of Occurrences:   2    Standing Expiration Date:   05/11/2021  . Iron and TIBC    Standing Status:   Standing    Number of Occurrences:   2    Standing Expiration Date:   05/11/2021    The total time spent in the appointment was 20 minutes encounter with patients including review of chart and various tests results, discussions about plan of care and coordination of care plan   All questions were answered. The patient knows to call the clinic with any problems, questions or concerns. No barriers to learning was detected.    Sophia Lark, MD 4/6/20223:05 PM  INTERVAL HISTORY: Sophia [name] 87 y.o. female returns for further follow-up on chronic iron deficiency anemia She has been feeling well The patient denies any recent signs or symptoms of bleeding such as spontaneous epistaxis, hematuria or hematochezia. Denies recent infection, fever or chills  SUMMARY OF HEMATOLOGIC HISTORY:  She was found to have abnormal CBC from recent blood count monitoring. The patient have chronic iron deficiency  anemia due to recurrent GI bleed. 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 She received blood transfusion and iron treatment Since February 2018, she received intravenous iron every 4- 6 weeks Starting in 2019 to 2021, she has reduced intravenous iron requirement  I have reviewed the past medical history, past surgical history, [name], risedronate sodium, fosamax [alendronate sodium], lipitor [atorvastatin], pregabalin, tolterodine tartrate, and wasp venom.  MEDICATIONS:  Current Outpatient Medications  Medication Sig Dispense Refill  . acetaminophen (TYLENOL) 500 MG tablet Take 500 mg by mouth 2 (two) times daily as needed for mild pain or fever.    [name] albuterol (PROVENTIL HFA;VENTOLIN HFA) 108 (90 Base) MCG/ACT inhaler Inhale 1-2 puffs into the lungs every 6 (six) hours as needed for wheezing or shortness of breath. 1 Inhaler 0  . ALPRAZolam (XANAX) 0.25 MG tablet Take 1 tablet (0.25 mg total) by mouth 2 (two) times daily as needed for anxiety. (Patient taking differently: Take 0.25  mg by mouth 4 (four) times daily as needed for anxiety.) 30 tablet 0  . Calcium Carb-Cholecalciferol (CALCIUM-VITAMIN D) 500-200 MG-UNIT tablet Take by mouth.    . cholecalciferol (VITAMIN D) 1000 units tablet Take 2,000 Units by mouth daily.    [name] DEXILANT 60 MG capsule TAKE 1 TABLET BY MOUTH DAILY EVERY MORNING. 90 capsule 2  . EPINEPHrine 0.3 mg/0.3 mL IJ SOAJ injection Inject 0.3 mg  into the muscle once as needed (for severe allergic reaction).    . fluorometholone (FML) 0.1 % ophthalmic suspension Place 1 drop into the left eye daily.     . folic acid (FOLVITE) 825 MCG tablet Take 800 mcg by mouth daily.    [name] gabapentin (NEURONTIN) 400 MG capsule Take 2 capsules (800 mg total) by mouth 2 (two) times daily. 60 capsule 0  . hydroxypropyl methylcellulose / hypromellose (ISOPTO TEARS / GONIOVISC) 2.5 % ophthalmic solution Place 1-2 drops into the right eye as needed for dry eyes.     [name] sertraline (ZOLOFT) 100 MG tablet Take 100 mg by mouth daily.     . verapamil (CALAN-SR) 240 MG CR tablet Take 240 mg by mouth daily.     No current facility-administered medications for this visit.     REVIEW OF SYSTEMS:   Constitutional: Denies fevers, [name], nose, mouth, throat, [name], [name], [name], [name], [name], no new changes  All other systems were reviewed with the patient and are negative.  PHYSICAL EXAMINATION: ECOG PERFORMANCE STATUS: 0 - Asymptomatic  Vitals:   05/11/20 1441  BP: 95/75  Pulse: 68  Resp: 18  Temp: 97.7 F (36.5 C)  SpO2: 100%   There were no vitals filed for this visit.  GENERAL:alert, no distress and comfortable.  She is sitting on the wheelchair with oxygen in situ SKIN: skin color, texture, turgor are normal, [name], [name], sclera clear OROPHARYNX:no exudate, no erythema and lips, buccal mucosa, and tongue normal  NECK: supple, thyroid normal  size, non-tender, without nodularity LYMPH:  no palpable lymphadenopathy in the cervical, axillary or inguinal LUNGS: clear to auscultation and percussion with normal breathing effort HEART: regular rate & rhythm with soft ejection systolic murmur and no lower extremity edema ABDOMEN:abdomen soft, non-tender and normal bowel sounds Musculoskeletal:no cyanosis of digits and no clubbing  NEURO: alert & oriented x 3 with fluent speech, no focal motor/sensory deficits  LABORATORY DATA:  I have reviewed the data as listed     Component Value Date/Time   NA 137 10/27/2019 1344   NA 139 03/23/2016 1448   K 4.4 10/27/2019 1344   K 4.0 03/23/2016 1448   CL 104 10/27/2019 1344   CO2 27 10/27/2019 1344   CO2 23 03/23/2016 1448   GLUCOSE 79 10/27/2019 1344   GLUCOSE 94 03/23/2016 1448   BUN 34 (H) 10/27/2019 1344   BUN 28.5 (H) 03/23/2016 1448   CREATININE 1.28 (H) 10/27/2019 1344   CREATININE 1.4 (H) 03/23/2016 1448   CALCIUM 9.0 10/27/2019 1344   CALCIUM 10.7 (H) 03/23/2016 1448   PROT 6.8 10/27/2019 1344   PROT 6.6 03/23/2016 1448  ALBUMIN 3.9 10/27/2019 1344   ALBUMIN 3.7 03/23/2016 1448   AST 13 (L) 10/27/2019 1344   AST 16 03/23/2016 1448   ALT 6 10/27/2019 1344   ALT 10 03/23/2016 1448   ALKPHOS 49 10/27/2019 1344   ALKPHOS 76 03/23/2016 1448   BILITOT 0.4 10/27/2019 1344   BILITOT 0.29 03/23/2016 1448   GFRNONAA 37 (L) 10/27/2019 1344   GFRAA 43 (L) 10/27/2019 1344    No results found for: SPEP, UPEP  Lab Results  Component Value Date   WBC 3.9 (L) 05/11/2020   NEUTROABS 2.7 05/11/2020   HGB 12.1 05/11/2020   HCT 36.7 05/11/2020   MCV 98.1 05/11/2020   PLT 162 05/11/2020      Chemistry      Component Value Date/Time   NA 137 10/27/2019 1344   NA 139 03/23/2016 1448   K 4.4 10/27/2019 1344   K 4.0 03/23/2016 1448   CL 104 10/27/2019 1344   CO2 27 10/27/2019 1344   CO2 23 03/23/2016 1448   BUN 34 (H) 10/27/2019 1344   BUN 28.5 (H) 03/23/2016 1448    CREATININE 1.28 (H) 10/27/2019 1344   CREATININE 1.4 (H) 03/23/2016 1448      Component Value Date/Time   CALCIUM 9.0 10/27/2019 1344   CALCIUM 10.7 (H) 03/23/2016 1448   ALKPHOS 49 10/27/2019 1344   ALKPHOS 76 03/23/2016 1448   AST 13 (L) 10/27/2019 1344   AST 16 03/23/2016 1448   ALT 6 10/27/2019 1344   ALT 10 03/23/2016 1448   BILITOT 0.4 10/27/2019 1344   BILITOT 0.29 03/23/2016 1448

## 2020-05-12 ENCOUNTER — Telehealth: Payer: Self-pay

## 2020-05-12 DIAGNOSIS — D72819 Decreased white blood cell count, unspecified: Secondary | ICD-10-CM | POA: Diagnosis not present

## 2020-05-12 DIAGNOSIS — N183 Chronic kidney disease, stage 3 unspecified: Secondary | ICD-10-CM | POA: Diagnosis not present

## 2020-05-12 DIAGNOSIS — E785 Hyperlipidemia, unspecified: Secondary | ICD-10-CM | POA: Diagnosis not present

## 2020-05-12 DIAGNOSIS — Z862 Personal history of diseases of the blood and blood-forming organs and certain disorders involving the immune mechanism: Secondary | ICD-10-CM | POA: Diagnosis not present

## 2020-05-12 LAB — URINALYSIS, COMPLETE (UACMP) WITH MICROSCOPIC
Bilirubin Urine: NEGATIVE
Glucose, UA: NEGATIVE mg/dL
Hgb urine dipstick: NEGATIVE
Ketones, ur: NEGATIVE mg/dL
Leukocytes,Ua: NEGATIVE
Nitrite: NEGATIVE
Protein, ur: NEGATIVE mg/dL
Specific Gravity, Urine: 1.02 (ref 1.005–1.030)
pH: 5 (ref 5.0–8.0)

## 2020-05-12 NOTE — Telephone Encounter (Signed)
-----   Message from [name], MD sent at 05/12/2020  9:39 AM EDT ----- Pls forward to her PCP ----- Message ----- From: Interface, Lab In [place] Sent: 05/11/2020   2:13 PM EDT To: [name], MD

## 2020-05-12 NOTE — Telephone Encounter (Signed)
Faxed requested labs to Dr. Pennie Banter office, fax # [phone]. Received confirmation.

## 2020-05-31 DIAGNOSIS — M25561 Pain in right knee: Secondary | ICD-10-CM | POA: Diagnosis not present

## 2020-05-31 DIAGNOSIS — D509 Iron deficiency anemia, unspecified: Secondary | ICD-10-CM | POA: Diagnosis not present

## 2020-05-31 DIAGNOSIS — M25562 Pain in left knee: Secondary | ICD-10-CM | POA: Diagnosis not present

## 2020-05-31 DIAGNOSIS — E559 Vitamin D deficiency, unspecified: Secondary | ICD-10-CM | POA: Diagnosis not present

## 2020-05-31 DIAGNOSIS — E611 Iron deficiency: Secondary | ICD-10-CM | POA: Diagnosis not present

## 2020-05-31 DIAGNOSIS — G8929 Other chronic pain: Secondary | ICD-10-CM | POA: Diagnosis not present

## 2020-05-31 DIAGNOSIS — Z9981 Dependence on supplemental oxygen: Secondary | ICD-10-CM | POA: Diagnosis not present

## 2020-05-31 DIAGNOSIS — M810 Age-related osteoporosis without current pathological fracture: Secondary | ICD-10-CM | POA: Diagnosis not present

## 2020-05-31 DIAGNOSIS — N189 Chronic kidney disease, unspecified: Secondary | ICD-10-CM | POA: Diagnosis not present

## 2020-05-31 DIAGNOSIS — D72819 Decreased white blood cell count, unspecified: Secondary | ICD-10-CM | POA: Diagnosis not present

## 2020-05-31 DIAGNOSIS — J9611 Chronic respiratory failure with hypoxia: Secondary | ICD-10-CM | POA: Diagnosis not present

## 2020-05-31 DIAGNOSIS — R5381 Other malaise: Secondary | ICD-10-CM | POA: Diagnosis not present

## 2020-07-06 ENCOUNTER — Other Ambulatory Visit: Payer: Self-pay

## 2020-07-06 ENCOUNTER — Inpatient Hospital Stay (HOSPITAL_COMMUNITY)
Admission: EM | Admit: 2020-07-06 | Discharge: 2020-07-14 | DRG: 193 | Disposition: A | Discharge status: Skilled Nursing Facility | Payer: Medicare Other | Attending: Family Medicine | Admitting: Family Medicine

## 2020-07-06 ENCOUNTER — Encounter (HOSPITAL_COMMUNITY): Payer: Self-pay

## 2020-07-06 ENCOUNTER — Emergency Department (HOSPITAL_COMMUNITY): Payer: Medicare Other

## 2020-07-06 ENCOUNTER — Inpatient Hospital Stay (HOSPITAL_COMMUNITY): Payer: Medicare Other

## 2020-07-06 DIAGNOSIS — F419 Anxiety disorder, unspecified: Secondary | ICD-10-CM | POA: Diagnosis present

## 2020-07-06 DIAGNOSIS — K5790 Diverticulosis of intestine, part unspecified, without perforation or abscess without bleeding: Secondary | ICD-10-CM | POA: Diagnosis present

## 2020-07-06 DIAGNOSIS — R7989 Other specified abnormal findings of blood chemistry: Secondary | ICD-10-CM | POA: Diagnosis present

## 2020-07-06 DIAGNOSIS — Z87892 Personal history of anaphylaxis: Secondary | ICD-10-CM

## 2020-07-06 DIAGNOSIS — I350 Nonrheumatic aortic (valve) stenosis: Secondary | ICD-10-CM | POA: Diagnosis not present

## 2020-07-06 DIAGNOSIS — M25552 Pain in left hip: Secondary | ICD-10-CM | POA: Diagnosis present

## 2020-07-06 DIAGNOSIS — M1711 Unilateral primary osteoarthritis, right knee: Secondary | ICD-10-CM | POA: Diagnosis present

## 2020-07-06 DIAGNOSIS — Z66 Do not resuscitate: Secondary | ICD-10-CM | POA: Diagnosis not present

## 2020-07-06 DIAGNOSIS — I499 Cardiac arrhythmia, unspecified: Secondary | ICD-10-CM | POA: Diagnosis not present

## 2020-07-06 DIAGNOSIS — D61818 Other pancytopenia: Secondary | ICD-10-CM | POA: Diagnosis not present

## 2020-07-06 DIAGNOSIS — G609 Hereditary and idiopathic neuropathy, unspecified: Secondary | ICD-10-CM | POA: Diagnosis present

## 2020-07-06 DIAGNOSIS — I10 Essential (primary) hypertension: Secondary | ICD-10-CM | POA: Diagnosis not present

## 2020-07-06 DIAGNOSIS — K31819 Angiodysplasia of stomach and duodenum without bleeding: Secondary | ICD-10-CM | POA: Diagnosis present

## 2020-07-06 DIAGNOSIS — M25569 Pain in unspecified knee: Secondary | ICD-10-CM

## 2020-07-06 DIAGNOSIS — Z79899 Other long term (current) drug therapy: Secondary | ICD-10-CM

## 2020-07-06 DIAGNOSIS — N183 Chronic kidney disease, stage 3 unspecified: Secondary | ICD-10-CM | POA: Diagnosis not present

## 2020-07-06 DIAGNOSIS — K219 Gastro-esophageal reflux disease without esophagitis: Secondary | ICD-10-CM | POA: Diagnosis present

## 2020-07-06 DIAGNOSIS — G2581 Restless legs syndrome: Secondary | ICD-10-CM | POA: Diagnosis present

## 2020-07-06 DIAGNOSIS — N179 Acute kidney failure, unspecified: Secondary | ICD-10-CM | POA: Diagnosis not present

## 2020-07-06 DIAGNOSIS — Z7189 Other specified counseling: Secondary | ICD-10-CM | POA: Diagnosis not present

## 2020-07-06 DIAGNOSIS — J9610 Chronic respiratory failure, unspecified whether with hypoxia or hypercapnia: Secondary | ICD-10-CM | POA: Diagnosis present

## 2020-07-06 DIAGNOSIS — M25461 Effusion, right knee: Secondary | ICD-10-CM | POA: Diagnosis present

## 2020-07-06 DIAGNOSIS — J9611 Chronic respiratory failure with hypoxia: Secondary | ICD-10-CM

## 2020-07-06 DIAGNOSIS — Z9851 Tubal ligation status: Secondary | ICD-10-CM

## 2020-07-06 DIAGNOSIS — M1990 Unspecified osteoarthritis, unspecified site: Secondary | ICD-10-CM | POA: Diagnosis present

## 2020-07-06 DIAGNOSIS — I509 Heart failure, unspecified: Secondary | ICD-10-CM | POA: Diagnosis not present

## 2020-07-06 DIAGNOSIS — M19012 Primary osteoarthritis, left shoulder: Secondary | ICD-10-CM | POA: Diagnosis not present

## 2020-07-06 DIAGNOSIS — M47816 Spondylosis without myelopathy or radiculopathy, lumbar region: Secondary | ICD-10-CM | POA: Diagnosis not present

## 2020-07-06 DIAGNOSIS — Z9981 Dependence on supplemental oxygen: Secondary | ICD-10-CM

## 2020-07-06 DIAGNOSIS — I110 Hypertensive heart disease with heart failure: Secondary | ICD-10-CM | POA: Diagnosis not present

## 2020-07-06 DIAGNOSIS — R2689 Other abnormalities of gait and mobility: Secondary | ICD-10-CM | POA: Diagnosis not present

## 2020-07-06 DIAGNOSIS — I248 Other forms of acute ischemic heart disease: Secondary | ICD-10-CM | POA: Diagnosis present

## 2020-07-06 DIAGNOSIS — I5023 Acute on chronic systolic (congestive) heart failure: Secondary | ICD-10-CM | POA: Diagnosis not present

## 2020-07-06 DIAGNOSIS — J9621 Acute and chronic respiratory failure with hypoxia: Secondary | ICD-10-CM | POA: Diagnosis not present

## 2020-07-06 DIAGNOSIS — I130 Hypertensive heart and chronic kidney disease with heart failure and stage 1 through stage 4 chronic kidney disease, or unspecified chronic kidney disease: Secondary | ICD-10-CM | POA: Diagnosis present

## 2020-07-06 DIAGNOSIS — F339 Major depressive disorder, recurrent, unspecified: Secondary | ICD-10-CM | POA: Diagnosis not present

## 2020-07-06 DIAGNOSIS — F32A Depression, unspecified: Secondary | ICD-10-CM | POA: Diagnosis present

## 2020-07-06 DIAGNOSIS — M25559 Pain in unspecified hip: Secondary | ICD-10-CM | POA: Diagnosis not present

## 2020-07-06 DIAGNOSIS — Z888 Allergy status to other drugs, medicaments and biological substances status: Secondary | ICD-10-CM

## 2020-07-06 DIAGNOSIS — R011 Cardiac murmur, unspecified: Secondary | ICD-10-CM

## 2020-07-06 DIAGNOSIS — D519 Vitamin B12 deficiency anemia, unspecified: Secondary | ICD-10-CM | POA: Diagnosis not present

## 2020-07-06 DIAGNOSIS — W06XXXA Fall from bed, initial encounter: Secondary | ICD-10-CM | POA: Diagnosis present

## 2020-07-06 DIAGNOSIS — M25561 Pain in right knee: Secondary | ICD-10-CM | POA: Diagnosis not present

## 2020-07-06 DIAGNOSIS — R410 Disorientation, unspecified: Secondary | ICD-10-CM | POA: Diagnosis not present

## 2020-07-06 DIAGNOSIS — Z515 Encounter for palliative care: Secondary | ICD-10-CM | POA: Diagnosis not present

## 2020-07-06 DIAGNOSIS — R58 Hemorrhage, not elsewhere classified: Secondary | ICD-10-CM | POA: Diagnosis not present

## 2020-07-06 DIAGNOSIS — E785 Hyperlipidemia, unspecified: Secondary | ICD-10-CM | POA: Diagnosis present

## 2020-07-06 DIAGNOSIS — J189 Pneumonia, unspecified organism: Principal | ICD-10-CM | POA: Diagnosis present

## 2020-07-06 DIAGNOSIS — R102 Pelvic and perineal pain: Secondary | ICD-10-CM | POA: Diagnosis not present

## 2020-07-06 DIAGNOSIS — E538 Deficiency of other specified B group vitamins: Secondary | ICD-10-CM | POA: Diagnosis present

## 2020-07-06 DIAGNOSIS — R0602 Shortness of breath: Secondary | ICD-10-CM

## 2020-07-06 DIAGNOSIS — N1831 Chronic kidney disease, stage 3a: Secondary | ICD-10-CM | POA: Diagnosis not present

## 2020-07-06 DIAGNOSIS — Z20822 Contact with and (suspected) exposure to covid-19: Secondary | ICD-10-CM | POA: Diagnosis present

## 2020-07-06 DIAGNOSIS — K449 Diaphragmatic hernia without obstruction or gangrene: Secondary | ICD-10-CM | POA: Diagnosis not present

## 2020-07-06 DIAGNOSIS — M810 Age-related osteoporosis without current pathological fracture: Secondary | ICD-10-CM | POA: Diagnosis present

## 2020-07-06 DIAGNOSIS — R778 Other specified abnormalities of plasma proteins: Secondary | ICD-10-CM | POA: Diagnosis not present

## 2020-07-06 DIAGNOSIS — M6281 Muscle weakness (generalized): Secondary | ICD-10-CM | POA: Diagnosis not present

## 2020-07-06 DIAGNOSIS — Z87891 Personal history of nicotine dependence: Secondary | ICD-10-CM

## 2020-07-06 DIAGNOSIS — I959 Hypotension, unspecified: Secondary | ICD-10-CM | POA: Diagnosis not present

## 2020-07-06 DIAGNOSIS — I517 Cardiomegaly: Secondary | ICD-10-CM | POA: Diagnosis not present

## 2020-07-06 DIAGNOSIS — Z91030 Bee allergy status: Secondary | ICD-10-CM

## 2020-07-06 DIAGNOSIS — M25512 Pain in left shoulder: Secondary | ICD-10-CM | POA: Diagnosis present

## 2020-07-06 DIAGNOSIS — Z743 Need for continuous supervision: Secondary | ICD-10-CM | POA: Diagnosis not present

## 2020-07-06 DIAGNOSIS — T502X5A Adverse effect of carbonic-anhydrase inhibitors, benzothiadiazides and other diuretics, initial encounter: Secondary | ICD-10-CM | POA: Diagnosis present

## 2020-07-06 HISTORY — DX: Unspecified fall, initial encounter: W19.XXXA

## 2020-07-06 HISTORY — DX: Repeated falls: R29.6

## 2020-07-06 LAB — CBC WITH DIFFERENTIAL/PLATELET
Abs Immature Granulocytes: 0.01 10*3/uL (ref 0.00–0.07)
Basophils Absolute: 0 10*3/uL (ref 0.0–0.1)
Basophils Relative: 0 %
Eosinophils Absolute: 0 10*3/uL (ref 0.0–0.5)
Eosinophils Relative: 1 %
HCT: 42.3 % (ref 36.0–46.0)
Hemoglobin: 13.7 g/dL (ref 12.0–15.0)
Immature Granulocytes: 0 %
Lymphocytes Relative: 14 %
Lymphs Abs: 0.5 10*3/uL — ABNORMAL LOW (ref 0.7–4.0)
MCH: 33.4 pg (ref 26.0–34.0)
MCHC: 32.4 g/dL (ref 30.0–36.0)
MCV: 103.2 fL — ABNORMAL HIGH (ref 80.0–100.0)
Monocytes Absolute: 0.3 10*3/uL (ref 0.1–1.0)
Monocytes Relative: 8 %
Neutro Abs: 2.6 10*3/uL (ref 1.7–7.7)
Neutrophils Relative %: 77 %
Platelets: 147 10*3/uL — ABNORMAL LOW (ref 150–400)
RBC: 4.1 MIL/uL (ref 3.87–5.11)
RDW: 15.2 % (ref 11.5–15.5)
WBC: 3.4 10*3/uL — ABNORMAL LOW (ref 4.0–10.5)
nRBC: 0 % (ref 0.0–0.2)

## 2020-07-06 LAB — BASIC METABOLIC PANEL WITH GFR
Anion gap: 6 (ref 5–15)
BUN: 17 mg/dL (ref 8–23)
CO2: 27 mmol/L (ref 22–32)
Calcium: 8.8 mg/dL — ABNORMAL LOW (ref 8.9–10.3)
Chloride: 103 mmol/L (ref 98–111)
Creatinine, Ser: 1.09 mg/dL — ABNORMAL HIGH (ref 0.44–1.00)
GFR, Estimated: 48 mL/min — ABNORMAL LOW
Glucose, Bld: 102 mg/dL — ABNORMAL HIGH (ref 70–99)
Potassium: 4 mmol/L (ref 3.5–5.1)
Sodium: 136 mmol/L (ref 135–145)

## 2020-07-06 LAB — ECHOCARDIOGRAM COMPLETE
AR max vel: 0.66 cm2
AV Area VTI: 0.62 cm2
AV Area mean vel: 0.76 cm2
AV Mean grad: 28 mmHg
AV Peak grad: 48.6 mmHg
Ao pk vel: 3.49 m/s
Area-P 1/2: 5.23 cm2
S' Lateral: 3.5 cm

## 2020-07-06 LAB — TROPONIN I (HIGH SENSITIVITY)
Troponin I (High Sensitivity): 39 ng/L — ABNORMAL HIGH (ref <18)
Troponin I (High Sensitivity): 35 ng/L — ABNORMAL HIGH

## 2020-07-06 LAB — BRAIN NATRIURETIC PEPTIDE: B Natriuretic Peptide: 837.8 pg/mL — ABNORMAL HIGH (ref 0.0–100.0)

## 2020-07-06 LAB — RESP PANEL BY RT-PCR (FLU A&B, COVID) ARPGX2
Influenza A by PCR: NEGATIVE
Influenza B by PCR: NEGATIVE
SARS Coronavirus 2 by RT PCR: NEGATIVE

## 2020-07-06 MED ORDER — SODIUM CHLORIDE 0.9% FLUSH: 3.0000 mL | INTRAVENOUS | Status: DC | PRN

## 2020-07-06 MED ORDER — SODIUM CHLORIDE 0.9% FLUSH
3.0000 mL | Freq: Two times a day (BID) | INTRAVENOUS | Status: DC
  Administered 2020-07-07 – 2020-07-13 (×12): 3 mL via INTRAVENOUS

## 2020-07-06 MED ORDER — LIDOCAINE 5 % EX PTCH
1.0000 | MEDICATED_PATCH | CUTANEOUS | Status: DC
  Administered 2020-07-06 – 2020-07-14 (×9): 1 via TRANSDERMAL
  Filled 2020-07-06 (×10): qty 1

## 2020-07-06 MED ORDER — PANTOPRAZOLE SODIUM 40 MG PO TBEC
40.0000 mg | DELAYED_RELEASE_TABLET | Freq: Every day | ORAL | Status: DC
  Administered 2020-07-07 – 2020-07-10 (×4): 40 mg via ORAL
  Filled 2020-07-06 (×4): qty 1

## 2020-07-06 MED ORDER — ALPRAZOLAM 0.5 MG PO TABS
0.5000 mg | ORAL_TABLET | Freq: Four times a day (QID) | ORAL | Status: DC | PRN
  Administered 2020-07-06: 0.5 mg via ORAL
  Filled 2020-07-06: qty 2
  Filled 2020-07-06: qty 1

## 2020-07-06 MED ORDER — SODIUM CHLORIDE 0.9 % IV SOLN
250.0000 mL | INTRAVENOUS | Status: DC | PRN
Start: 2020-07-06 — End: 2020-07-14

## 2020-07-06 MED ORDER — ALPRAZOLAM 0.25 MG PO TABS
0.2500 mg | ORAL_TABLET | Freq: Four times a day (QID) | ORAL | Status: DC | PRN
  Administered 2020-07-06: 0.25 mg via ORAL
  Filled 2020-07-06: qty 1

## 2020-07-06 MED ORDER — ENOXAPARIN SODIUM 40 MG/0.4ML IJ SOSY
40.0000 mg | PREFILLED_SYRINGE | INTRAMUSCULAR | Status: DC
  Administered 2020-07-06 – 2020-07-07 (×2): 40 mg via SUBCUTANEOUS
  Filled 2020-07-06 (×2): qty 0.4

## 2020-07-06 MED ORDER — VERAPAMIL HCL ER 240 MG PO TBCR
240.0000 mg | EXTENDED_RELEASE_TABLET | Freq: Every day | ORAL | Status: DC
  Administered 2020-07-07 – 2020-07-14 (×8): 240 mg via ORAL
  Filled 2020-07-06 (×8): qty 1

## 2020-07-06 MED ORDER — ALPRAZOLAM 0.25 MG PO TABS
0.2500 mg | ORAL_TABLET | Freq: Four times a day (QID) | ORAL | Status: DC | PRN
  Administered 2020-07-06 – 2020-07-08 (×2): 0.25 mg via ORAL
  Administered 2020-07-08 – 2020-07-14 (×7): 0.5 mg via ORAL
  Filled 2020-07-06 (×7): qty 2
  Filled 2020-07-06: qty 1

## 2020-07-06 MED ORDER — LORAZEPAM 1 MG PO TABS
0.5000 mg | ORAL_TABLET | Freq: Once | ORAL | Status: AC
  Administered 2020-07-06: 0.5 mg via ORAL
  Filled 2020-07-06: qty 1

## 2020-07-06 MED ORDER — PERFLUTREN LIPID MICROSPHERE
1.0000 mL | INTRAVENOUS | Status: AC | PRN
  Administered 2020-07-06: 5 mL via INTRAVENOUS
  Filled 2020-07-06: qty 10

## 2020-07-06 MED ORDER — FUROSEMIDE 10 MG/ML IJ SOLN
40.0000 mg | Freq: Two times a day (BID) | INTRAMUSCULAR | Status: DC
  Administered 2020-07-06: 40 mg via INTRAVENOUS
  Filled 2020-07-06: qty 4

## 2020-07-06 MED ORDER — ACETAMINOPHEN 325 MG PO TABS
650.0000 mg | ORAL_TABLET | ORAL | Status: DC | PRN
  Administered 2020-07-06 – 2020-07-14 (×8): 650 mg via ORAL
  Filled 2020-07-06 (×8): qty 2

## 2020-07-06 MED ORDER — LIDOCAINE 5 % EX PTCH
1.0000 | MEDICATED_PATCH | CUTANEOUS | Status: DC
  Administered 2020-07-08 – 2020-07-14 (×7): 1 via TRANSDERMAL
  Filled 2020-07-06 (×10): qty 1

## 2020-07-06 MED ORDER — SERTRALINE HCL 100 MG PO TABS
100.0000 mg | ORAL_TABLET | Freq: Every day | ORAL | Status: DC
  Administered 2020-07-07 – 2020-07-14 (×8): 100 mg via ORAL
  Filled 2020-07-06 (×8): qty 1

## 2020-07-06 MED ORDER — FUROSEMIDE 10 MG/ML IJ SOLN
40.0000 mg | Freq: Once | INTRAMUSCULAR | Status: AC
  Administered 2020-07-06: 40 mg via INTRAVENOUS
  Filled 2020-07-06: qty 4

## 2020-07-06 MED ORDER — ONDANSETRON HCL 4 MG/2ML IJ SOLN: 4.0000 mg | Freq: Four times a day (QID) | INTRAMUSCULAR | Status: DC | PRN

## 2020-07-06 MED ORDER — GABAPENTIN 400 MG PO CAPS
800.0000 mg | ORAL_CAPSULE | Freq: Two times a day (BID) | ORAL | Status: DC | PRN
  Administered 2020-07-06 – 2020-07-14 (×7): 800 mg via ORAL
  Filled 2020-07-06 (×8): qty 2

## 2020-07-06 NOTE — ED Triage Notes (Signed)
BIB EMS for [facility] and lethargy. Pt also have frequent falls. Per son pt has had general decline with her ADLs last few weeks. Pt on [facility] which is baseline for her. Pt is oriented to self and situation.

## 2020-07-06 NOTE — ED Provider Notes (Signed)
[facility] DEPART[facility] Provider Note   CSN: [id number] Arrival date & time: 07/06/20  0820     History Chief Complaint  Patient presents with  . Shortness of Breath  . Fatigue    Sophia [name] is a 87 y.o. female.  Presented to ER with concern for shortness of breath, generalized weakness, difficulty with mobility.  History obtained from patient and son.  Over the last 3 or 4 weeks patient has had some increased and her difficulty breathing.  Chronically on oxygen 2 L nasal cannula.  Has had some increased mobility, fell on her left shoulder 1 week ago.  She denied hitting her head at the time.  No loss of consciousness.  Son lives with patient, desires to avoid nursing facility if possible, does not have home health at present.  Patient denies cough, congestion, fever.  No abdominal pain nausea or vomiting.  Son reports that she may have been diagnosed previously with heart failure and aortic stenosis, unsure of last echo.  HPI     Past Medical History:  Diagnosis Date  . Anemia   . Anxiety   . Aortic stenosis   . Arthritis   . Blood transfusion   . Cataracts, bilateral    removed  . Congestive heart failure ([place])   . Depression   . Diverticulosis 03/14/10  . Duodenitis 03/15/10   per capsule endoscopy report  . DVT (deep venous thrombosis) ([place])    pt denies  . Dyslipidemia   . Falls    frequent falls  . GAVE (gastric antral vascular ectasia)   . GERD (gastroesophageal reflux disease)   . Heart murmur   . Hemorrhagic gastritis 03/15/10   per capsule endoscopy report  . Hiatal hernia   . Hyperlipidemia   . Hyperplastic colon polyp   . Hypertension   . Neuropathy    bil big toes  . Osteoporosis   . Presbyesophagus   . Unspecified deficiency anemia 08/24/2013   Iron infusion     Patient Active Problem List   Diagnosis Date Noted  . Preventive measure 10/27/2019  . Vitamin D deficiency 12/02/2018  . Respiratory failure, chronic  ([place]) 08/05/2017  . Anemia due to chronic blood loss   . GIB (gastrointestinal bleeding) 02/03/2017  . CKD (chronic kidney disease), stage III ([place]) 02/03/2017  . Chronic combined systolic (congestive) and diastolic (congestive) heart failure ([place]) 02/03/2017  . Orthostatic hypotension 01/12/2016  . Iron deficiency anemia 10/31/2015  . Cataract cortical, senile 07/22/2014  . Absolute anemia   . Chronic leukopenia 09/07/2013  . Deficiency anemia 08/24/2013  . GAVE (gastric antral vascular ectasia) 04/28/2012  . Chronic gastritis 01/09/2011  . Aortic stenosis 01/09/2011  . Epigastric pain 07/29/2010  . Hyperlipidemia 08/05/2006  . Iron deficiency anemia due to chronic blood loss 08/05/2006  . ANXIETY DEPRESSION 08/05/2006  . NEUROPATHY, IDIOPATHIC PERIPHERAL NEC 08/05/2006  . Essential hypertension 08/05/2006  . GASTROESOPHAGEAL REFLUX DISEASE 08/05/2006  . OSTEOARTHRITIS 08/05/2006  . [place] DISEASE 08/05/2006  . OSTEOPOROSIS 08/05/2006  . SYMPTOM, INCONTINENCE, URINARY NOS 08/05/2006    Past Surgical History:  Procedure Laterality Date  . CATARACT EXTRACTION, BILATERAL    . CHOLECYSTECTOMY    . ENTEROSCOPY N/A 07/08/2014   Procedure: ENTEROSCOPY;  Surgeon: Lafayette Dragon, MD;  Location: [facility] ENDOSCOPY;  Service: Endoscopy;  Laterality: N/A;  . ESOPHAGOGASTRODUODENOSCOPY N/A 04/28/2012   Procedure: ESOPHAGOGASTRODUODENOSCOPY (EGD);  Surgeon: Lafayette Dragon, MD;  Location: [facility];  Service: Endoscopy;  Laterality:  N/A;  . ESOPHAGOGASTRODUODENOSCOPY N/A 04/12/2014   Procedure: ESOPHAGOGASTRODUODENOSCOPY (EGD);  Surgeon: Lafayette Dragon, MD;  Location: [facility];  Service: Endoscopy;  Laterality: N/A;  . ESOPHAGOGASTRODUODENOSCOPY N/A 02/06/2017   Procedure: ESOPHAGOGASTRODUODENOSCOPY (EGD);  Surgeon: Jerene Bears, MD;  Location: [facility] ENDOSCOPY;  Service: Gastroenterology;  Laterality: N/A;  . FOOT SURGERY  1998  . fracture left knee  1993   x 2   . HOT HEMOSTASIS N/A  04/28/2012   Procedure: HOT HEMOSTASIS (ARGON PLASMA COAGULATION/BICAP);  Surgeon: Lafayette Dragon, MD;  Location: [facility]OS[facility];  Service: Endoscopy;  Laterality: N/A;  . HOT HEMOSTASIS N/A 04/12/2014   Procedure: HOT HEMOSTASIS (ARGON PLASMA COAGULATION/BICAP);  Surgeon: Lafayette Dragon, MD;  Location: [facility];  Service: Endoscopy;  Laterality: N/A;  . HOT HEMOSTASIS N/A 07/08/2014   Procedure: HOT HEMOSTASIS (ARGON PLASMA COAGULATION/BICAP);  Surgeon: Lafayette Dragon, MD;  Location: [facility] ENDOS[facility];  Service: Endoscopy;  Laterality: N/A;  . left eye cornea  surgery  yrs ago  . ORIF WRIST FRACTURE Right   . plantar fasciitis repair     left  . TUBAL LIGATION       OB History   No obstetric history on file.     Family History  Problem Relation Age of Onset  . Prostate cancer Father   . Uterine cancer Sister   . Kidney cancer Brother   . Kidney cancer Son   . Colon cancer Sister        mets from uterine cancer    Social History   Tobacco Use  . Smoking status: Former Smoker    Packs/day: 0.50    Years: 20.00    Pack years: 10.00    Types: Cigarettes    Quit date: 01/08/1989    Years since quitting: 31.5  . Smokeless tobacco: Never Used  Vaping Use  . Vaping Use: Never used  Substance Use Topics  . Alcohol use: No    Alcohol/week: 0.0 standard drinks  . Drug use: No    Home Medications Prior to Admission medications   Medication Sig Start Date End Date Taking? Authorizing Provider  acetaminophen (TYLENOL) 500 MG tablet Take 1,000 mg by mouth every 6 (six) hours as needed for mild pain or fever.   Yes [provider]  ALPRAZolam (XANAX) 0.25 MG tablet Take 1 tablet (0.25 mg total) by mouth 2 (two) times daily as needed for anxiety. Patient taking differently: Take 0.25 mg by mouth 4 (four) times daily as needed for anxiety. 02/08/17  Yes Regalado, Belkys A, MD  DEXILANT 60 MG capsule TAKE 1 TABLET BY MOUTH DAILY EVERY MORNING. Patient taking differently: Take 60 mg by  mouth daily. 08/26/18  Yes Nandigam, Venia Minks, MD  EPINEPHrine 0.3 mg/0.3 mL IJ SOAJ injection Inject 0.3 mg into the muscle once as needed (for severe allergic reaction).   Yes [provider]  gabapentin (NEURONTIN) 400 MG capsule Take 2 capsules (800 mg total) by mouth 2 (two) times daily. Patient taking differently: Take 800 mg by mouth 2 (two) times daily as needed (nerve pain). 02/08/17  Yes Regalado, Belkys A, MD  hydroxypropyl methylcellulose / hypromellose (ISOPTO TEARS / GONIOVISC) 2.5 % ophthalmic solution Place 1-2 drops into both eyes as needed for dry eyes.   Yes [provider]  OXYGEN Inhale 2 L into the lungs continuous.   Yes [provider]  PROLIA 60 MG/ML SOSY injection Inject 60 mg into the skin every 6 (six) months. 03/15/20  Yes  [provider]  sertraline (ZOLOFT) 100 MG tablet Take 100 mg by mouth daily.  02/15/16  Yes [provider]  verapamil (CALAN-SR) 240 MG CR tablet Take 240 mg by mouth daily.   Yes [provider]    Allergies    Nutritional supplements, [name], [name] [alendronate sodium], Lipitor [atorvastatin], Pregabalin, [name], and Wasp venom  Review of Systems   Review of Systems  Constitutional: Negative for chills and fever.  HENT: Negative for ear pain and sore throat.   Eyes: Negative for pain and visual disturbance.  Respiratory: Positive for shortness of breath. Negative for cough.   Cardiovascular: Negative for chest pain and palpitations.  Gastrointestinal: Negative for abdominal pain and vomiting.  Genitourinary: Negative for dysuria and hematuria.  Musculoskeletal: Positive for arthralgias. Negative for back pain.  Skin: Negative for color change and rash.  Neurological: Negative for seizures and syncope.  Psychiatric/Behavioral: The patient is nervous/anxious.   All other systems reviewed and are negative.   Physical Exam Updated Vital Signs BP (!) 144/91    Pulse 89   Temp 97.6 F (36.4 C) (Oral)   Resp 20   SpO2 97%   Physical Exam Vitals and nursing note reviewed.  Constitutional:      General: She is not in acute distress.    Appearance: She is well-developed.  HENT:     Head: Normocephalic and atraumatic.  Eyes:     Conjunctiva/sclera: Conjunctivae normal.  Cardiovascular:     Rate and Rhythm: Normal rate and regular rhythm.     Heart sounds: Murmur heard.      Comments: Soft systolic ejection murmur Pulmonary:     Effort: Pulmonary effort is normal. No respiratory distress.     Breath sounds: Normal breath sounds.  Abdominal:     Palpations: Abdomen is soft.     Tenderness: There is no abdominal tenderness.  Musculoskeletal:     Cervical back: Neck supple.     Comments: LUE some tenderness over the left shoulder but no significant deformity appreciated, normal joint ROM, normal radial pulse RUE: no TTP throughout, no deformity RLE: no TTP throughout, no deformity LLE: no TTP throughout, no deformity  Skin:    General: Skin is warm and dry.  Neurological:     Mental Status: She is alert.     ED Results / Procedures / Treatments   Labs (all labs ordered are listed, but only abnormal results are displayed) Labs Reviewed  CBC WITH DIFFERENTIAL/PLATELET - Abnormal; Notable for the following components:      Result Value   WBC 3.4 (*)    MCV 103.2 (*)    Platelets 147 (*)    Lymphs Abs 0.5 (*)    All other components within normal limits  BASIC METABOLIC PANEL - Abnormal; Notable for the following components:   Glucose, Bld 102 (*)    Creatinine, Ser 1.09 (*)    Calcium 8.8 (*)    GFR, Estimated 48 (*)    All other components within normal limits  BRAIN NATRIURETIC PEPTIDE - Abnormal; Notable for the following components:   B Natriuretic Peptide 837.8 (*)    All other components within normal limits  TROPONIN I (HIGH SENSITIVITY) - Abnormal; Notable for the following components:   Troponin I (High Sensitivity)  39 (*)    All other components within normal limits  RESP PANEL BY RT-PCR (FLU A&B, COVID) ARPGX2  TROPONIN I (HIGH SENSITIVITY)    EKG EKG Interpretation  Date/Time:  Wednesday July 06 2020 08:49:12 EDT Ventricular Rate:  89 PR Interval:  172 QRS Duration: 121 QT Interval:  393 QTC Calculation: 479 R Axis:   -17 Text Interpretation: Sinus rhythm Left bundle branch block Confirmed by Madalyn Rob [phone]) on 07/06/2020 9:38:27 AM   Radiology DG Pelvis Portable  Result Date: 07/06/2020 CLINICAL DATA:  Pain following fall EXAM: PORTABLE PELVIS 1-2 VIEWS COMPARISON:  None. FINDINGS: There is no evidence of pelvic fracture or dislocation. There is mild symmetric narrowing of each hip joint. No erosive change. Bone somewhat osteoporotic. Degenerative change noted in lower lumbar spine. IMPRESSION: No evident acute fracture or dislocation. Osteoporosis. Mild symmetric narrowing of each hip joint. Electronically Signed   By: Lowella Grip III M.D.   On: 07/06/2020 09:23   DG Chest Portable 1 View  Result Date: 07/06/2020 CLINICAL DATA:  Fall, shortness of breath, lethargy, shoulder pain EXAM: PORTABLE CHEST 1 VIEW COMPARISON:  02/02/2017 FINDINGS: Stable cardiomegaly. Atherosclerotic calcification of the aortic knob. Hiatal hernia noted. Interstitial thickening, most pronounced within the left mid lung. No focal airspace consolidation, pleural effusion, or pneumothorax. IMPRESSION: 1. Interstitial thickening, most pronounced within the left mid lung. Findings favored to represent chronic bronchitic type lung changes. Superimposed mild edema or developing infection not excluded. 2. Cardiomegaly. 3. Hiatal hernia. Electronically Signed   By: Davina Poke D.O.   On: 07/06/2020 09:27   DG Shoulder Left Portable  Result Date: 07/06/2020 CLINICAL DATA:  Fall.  Shoulder pain. EXAM: LEFT SHOULDER COMPARISON:  No recent. FINDINGS: Acromioclavicular and glenohumeral degenerative change. No acute  bony or joint abnormality. No evidence of fracture or dislocation. IMPRESSION: Acromioclavicular glenohumeral degenerative change. No acute bony abnormality. Electronically Signed   By: Marcello Moores  Register   On: 07/06/2020 09:30    Procedures Procedures   Medications Ordered in ED Medications  furosemide (LASIX) injection 40 mg (has no administration in time range)  LORazepam (ATIVAN) tablet 0.5 mg (0.5 mg Oral Given 07/06/20 3790)    ED Course  I have reviewed the triage vital signs and the nursing notes.  Pertinent labs & imaging results that were available during my care of the patient were reviewed by me and considered in my medical decision making (see chart for details).    MDM Rules/Calculators/A&P                         87 year old lady presenting to ER with concern for shortness of breath, increased anxiety and difficulty with mobility at home.  On exam, she appears mildly dyspneic but not in acute distress.  Vital stable on home 2 L.  Noted mild systolic murmur on exam.  Son states patient had been diagnosed with aortic stenosis in 2006 but does not believe she has had an echocardiogram since that time.  CXR with chronic changes versus mild edema.  Slight elevation in troponin but no clear ischemic changes on EKG, lower suspicion for ACS at present.  BNP is elevated.  Concern for possible heart failure exacerbation. Given worsening dyspnea, troponin elevation, BNP elevation, murmur, believe patient would benefit from admission for trial of IV diuresis and echocardiogram.  Will consult the hospitalist service for admission.  Final Clinical Impression(s) / ED Diagnoses Final diagnoses:  Shortness of breath  Heart murmur  Heart failure, unspecified HF chronicity, unspecified heart failure type ([place])    Rx / DC Orders ED Discharge Orders    None       Azusena Erlandson, Ellwood Dense, MD  07/06/20 1107  

## 2020-07-06 NOTE — H&P (Signed)
History and Physical    Sophia [name] ZSW:[id number] DOB: [birth year] DOA: 07/06/2020  Referring MD/NP/PA: Madalyn Rob, MD PCP: [name], MD  Patient coming from: home via EMS  Chief Complaint: Shortness of breath  I have personally briefly reviewed stool old medical records in [place]   HPI: Sophia [name] is a 87 y.o. female with medical history significant of CHF, aortic stenosis, chronic respiratory failure on 2 L oxygen at baseline, osteoarthritis, and anxiety who presents with complaints of shortness of breath which started yesterday.  History is obtained from the patient with the assistance of her son present at bedside.  Her son had bumped up her oxygen to 2.5 L with temporary improvement in symptoms.  However, overnight patient woke up around 2 AM and stated that she was unable to breathe.  Patient denies having any significant cough or leg swelling.  She had accidentally fallen out of bed last week and had been complaining of left shoulder pain.  She had declined to be evaluated medically at that time.  At home patient has a high chair that is usually right beside her, but normally she will want to get up and the restroom.  However, 2-3 weeks ago she had been incontinent of stool on herself while trying to make it to the bathroom and has been more anxious.  She complains that her knees feel like they may give out on her and so she has not been wanting to get up and move around very much.  Normally she had been able to get up with a walker and complete most of her ADLs.  However, here recently she had been relying on her son to help with assistance with bathing and ambulation.  ED Course: Upon admission into the emergency department patient was seen to be afebrile, respiration 14-24, blood pressures 134/70 8-1 80/105, and O2 saturations currently maintained on 2 L of nasal cannula oxygen.  Labs are significant for WBC 3.4, platelets 147, BUN 17, creatinine 1.09, BNP  837.8, and high-sensitivity troponin 39-> 35.  Chest x-ray significant for interstitial thickening most pronounced on the left mid lung concerning for bronchitic type lung changes.  Superimposed edema or developing infection cannot be excluded, hiatal hernia, and cardiomegaly.  Patient was given Lasix 40 mg IV and Ativan 0.5 mg.  Review of Systems  Constitutional: Positive for malaise/fatigue. Negative for fever.  HENT: Positive for hearing loss.   Eyes: Negative for pain and discharge.  Respiratory: Positive for shortness of breath. Negative for cough and stridor.   Cardiovascular: Negative for chest pain and leg swelling.  Gastrointestinal: Negative for abdominal pain, nausea and vomiting.  Genitourinary: Negative for dysuria and frequency.  Musculoskeletal: Positive for back pain and falls.  Neurological: Negative for focal weakness and loss of consciousness.  Psychiatric/Behavioral: Negative for substance abuse. The patient is nervous/anxious.     Past Medical History:  Diagnosis Date  . Anemia   . Anxiety   . Aortic stenosis   . Arthritis   . Blood transfusion   . Cataracts, bilateral    removed  . Congestive heart failure ([place])   . Depression   . Diverticulosis 03/14/10  . Duodenitis 03/15/10   per capsule endoscopy report  . DVT (deep venous thrombosis) ([place])    pt denies  . Dyslipidemia   . Falls    frequent falls  . GAVE (gastric antral vascular ectasia)   . GERD (gastroesophageal reflux disease)   . Heart murmur   .  Hemorrhagic gastritis 03/15/10   per capsule endoscopy report  . Hiatal hernia   . Hyperlipidemia   . Hyperplastic colon polyp   . Hypertension   . Neuropathy    bil big toes  . Osteoporosis   . Presbyesophagus   . Unspecified deficiency anemia 08/24/2013   Iron infusion     Past Surgical History:  Procedure Laterality Date  . CATARACT EXTRACTION, BILATERAL    . CHOLECYSTECTOMY    . ENTEROSCOPY N/A 07/08/2014   Procedure: ENTEROSCOPY;  Surgeon:  Lafayette Dragon, MD;  Location: [facility] ENDOSCOPY;  Service: Endoscopy;  Laterality: N/A;  . ESOPHAGOGASTRODUODENOSCOPY N/A 04/28/2012   Procedure: ESOPHAGOGASTRODUODENOSCOPY (EGD);  Surgeon: Lafayette Dragon, MD;  Location: [facility];  Service: Endoscopy;  Laterality: N/A;  . ESOPHAGOGASTRODUODENOSCOPY N/A 04/12/2014   Procedure: ESOPHAGOGASTRODUODENOSCOPY (EGD);  Surgeon: Lafayette Dragon, MD;  Location: [facility];  Service: Endoscopy;  Laterality: N/A;  . ESOPHAGOGASTRODUODENOSCOPY N/A 02/06/2017   Procedure: ESOPHAGOGASTRODUODENOSCOPY (EGD);  Surgeon: Jerene Bears, MD;  Location: [facility] ENDOSCOPY;  Service: Gastroenterology;  Laterality: N/A;  . FOOT SURGERY  1998  . fracture left knee  1993   x 2   . HOT HEMOSTASIS N/A 04/28/2012   Procedure: HOT HEMOSTASIS (ARGON PLASMA COAGULATION/BICAP);  Surgeon: Lafayette Dragon, MD;  Location: [facility] ENDOSCOPY;  Service: Endoscopy;  Laterality: N/A;  . HOT HEMOSTASIS N/A 04/12/2014   Procedure: HOT HEMOSTASIS (ARGON PLASMA COAGULATION/BICAP);  Surgeon: Lafayette Dragon, MD;  Location: [facility] ENDOSCOPY;  Service: Endoscopy;  Laterality: N/A;  . HOT HEMOSTASIS N/A 07/08/2014   Procedure: HOT HEMOSTASIS (ARGON PLASMA COAGULATION/BICAP);  Surgeon: Lafayette Dragon, MD;  Location: [facility] ENDOSCOPY;  Service: Endoscopy;  Laterality: N/A;  . left eye cornea  surgery  yrs ago  . ORIF WRIST FRACTURE Right   . plantar fasciitis repair     left  . TUBAL LIGATION       reports that she quit smoking about 31 years ago. Her smoking use included cigarettes. She has a 10.00 pack-year smoking history. She has never used smokeless tobacco. She reports that she does not drink alcohol and does not use drugs.  Allergies  Allergen Reactions  . Nutritional Supplements Anaphylaxis  . [name] Shortness Of Breath  . [name] [Alendronate Sodium] Other (See Comments)    Reaction:  GI upset   . Lipitor [Atorvastatin] Other (See Comments)    Reaction:  Muscle aches   . Pregabalin Nausea And  Vomiting  . [name] Nausea And Vomiting  . Wasp Venom Hives    Family History  Problem Relation Age of Onset  . Prostate cancer Father   . Uterine cancer Sister   . Kidney cancer Brother   . Kidney cancer Son   . Colon cancer Sister        mets from uterine cancer    Prior to Admission medications   Medication Sig Start Date End Date Taking? Authorizing Provider  acetaminophen (TYLENOL) 500 MG tablet Take 1,000 mg by mouth every 6 (six) hours as needed for mild pain or fever.   Yes [provider]  ALPRAZolam (XANAX) 0.25 MG tablet Take 1 tablet (0.25 mg total) by mouth 2 (two) times daily as needed for anxiety. Patient taking differently: Take 0.25 mg by mouth 4 (four) times daily as needed for anxiety. 02/08/17  Yes Regalado, Belkys A, MD  DEXILANT 60 MG capsule TAKE 1 TABLET BY MOUTH DAILY EVERY MORNING. Patient taking differently: Take 60 mg by mouth daily. 08/26/18  Yes Nandigam, Venia Minks, MD  EPINEPHrine 0.3 mg/0.3 mL IJ SOAJ injection Inject 0.3 mg into the muscle once as needed (for severe allergic reaction).   Yes [provider]  gabapentin (NEURONTIN) 400 MG capsule Take 2 capsules (800 mg total) by mouth 2 (two) times daily. Patient taking differently: Take 800 mg by mouth 2 (two) times daily as needed (nerve pain). 02/08/17  Yes Regalado, Belkys A, MD  hydroxypropyl methylcellulose / hypromellose (ISOPTO TEARS / GONIOVISC) 2.5 % ophthalmic solution Place 1-2 drops into both eyes as needed for dry eyes.   Yes [provider]  OXYGEN Inhale 2 L into the lungs continuous.   Yes [provider]  PROLIA 60 MG/ML SOSY injection Inject 60 mg into the skin every 6 (six) months. 03/15/20  Yes [provider]  sertraline (ZOLOFT) 100 MG tablet Take 100 mg by mouth daily.  02/15/16  Yes [provider]  verapamil (CALAN-SR) 240 MG CR tablet Take 240 mg by mouth daily.   Yes [provider]    Physical  Exam:  Constitutional: Elderly female who appears to be in some discomfort. Vitals:   07/06/20 0900 07/06/20 0915 07/06/20 0930 07/06/20 1100  BP: (!) 176/74 (!) 154/102 (!) 166/105 (!) 144/91  Pulse: 86 88 89 84  Resp: 18 (!) 22 19 20   Temp:      TempSrc:      SpO2: 99% 99% 100% 97%   Eyes: PERRL, lids and conjunctivae normal ENMT: Mucous membranes are moist. Posterior pharynx clear of any exudate or lesions.hard of hearing Neck: normal, supple, no masses, no thyromegaly Respiratory: Intermittent crackles appreciated in bilateral lung fields.  Patient currently on 2 L of nasal cannula oxygen with O2 saturations maintained. Cardiovascular: Regular rate and rhythm with a positive systolic ejection murmur appreciated.  No extremity edema. 2+ pedal pulses. No carotid bruits.  Abdomen: no tenderness, no masses palpated. No hepatosplenomegaly. Bowel sounds positive.  Musculoskeletal: no clubbing / cyanosis.  Crepitation appreciated of the right knee.  Rod present of the left leg Skin: no rashes, lesions, ulcers. No induration Neurologic: CN 2-12 grossly intact. Sensation intact, DTR normal. Strength 5/5 in all 4.  Psychiatric: Normal judgment and insight. Alert and oriented x 3.  Anxious mood.     Labs on Admission: I have personally reviewed following labs and imaging studies  CBC: Recent Labs  Lab 07/06/20 0825  WBC 3.4*  NEUTROABS 2.6  HGB 13.7  HCT 42.3  MCV 103.2*  PLT 440*   Basic Metabolic Panel: Recent Labs  Lab 07/06/20 0825  NA 136  K 4.0  CL 103  CO2 27  GLUCOSE 102*  BUN 17  CREATININE 1.09*  CALCIUM 8.8*   GFR: CrCl cannot be calculated (Unknown ideal weight.). Liver Function Tests: No results for input(s): AST, ALT, ALKPHOS, BILITOT, PROT, ALBUMIN in the last 168 hours. No results for input(s): LIPASE, AMYLASE in the last 168 hours. No results for input(s): AMMONIA in the last 168 hours. Coagulation Profile: No results for input(s): INR, PROTIME in  the last 168 hours. Cardiac Enzymes: No results for input(s): CKTOTAL, CKMB, CKMBINDEX, TROPONINI in the last 168 hours. BNP (last 3 results) No results for input(s): PROBNP in the last 8760 hours. HbA1C: No results for input(s): HGBA1C in the last 72 hours. CBG: No results for input(s): GLUCAP in the last 168 hours. Lipid Profile: No results for input(s): CHOL, HDL, LDLCALC, TRIG, CHOLHDL, LDLDIRECT in the last 72 hours. Thyroid Function Tests: No  results for input(s): TSH, T4TOTAL, FREET4, T3FREE, THYROIDAB in the last 72 hours. Anemia Panel: No results for input(s): VITAMINB12, FOLATE, FERRITIN, TIBC, IRON, RETICCTPCT in the last 72 hours. Urine analysis:    Component Value Date/Time   COLORURINE YELLOW 05/12/2020 1300   APPEARANCEUR CLEAR 05/12/2020 1300   LABSPEC 1.020 05/12/2020 1300   PHURINE 5.0 05/12/2020 1300   GLUCOSEU NEGATIVE 05/12/2020 1300   HGBUR NEGATIVE 05/12/2020 1300   BILIRUBINUR NEGATIVE 05/12/2020 1300   KETONESUR NEGATIVE 05/12/2020 1300   PROTEINUR NEGATIVE 05/12/2020 1300   UROBILINOGEN 0.2 08/27/2014 1925   NITRITE NEGATIVE 05/12/2020 1300   LEUKOCYTESUR NEGATIVE 05/12/2020 1300   Sepsis Labs: Recent Results (from the past 240 hour(s))  Resp Panel by RT-PCR (Flu A&B, Covid) Nasopharyngeal Swab     Status: None   Collection Time: 07/06/20  8:51 AM   Specimen: Nasopharyngeal Swab; Nasopharyngeal(NP) swabs in vial transport medium  Result Value Ref Range Status   SARS Coronavirus 2 by RT PCR NEGATIVE NEGATIVE Final    Comment: (NOTE) SARS-CoV-2 target nucleic acids are NOT DETECTED.  The SARS-CoV-2 RNA is generally detectable in upper respiratory specimens during the acute phase of infection. The lowest concentration of SARS-CoV-2 viral copies this assay can detect is 138 copies/mL. A negative result does not preclude SARS-Cov-2 infection and should not be used as the sole basis for treatment or other patient management decisions. A negative  result may occur with  improper specimen collection/handling, submission of specimen other than nasopharyngeal swab, presence of viral mutation(s) within the areas targeted by this assay, and inadequate number of viral copies(<138 copies/mL). A negative result must be combined with clinical observations, patient history, and epidemiological information. The expected result is Negative.  Fact Sheet for Patients:  EntrepreneurPulse.com.au  Fact Sheet for Healthcare Providers:  IncredibleEmployment.be  This test is no t yet approved or cleared by the [place] FDA and  has been authorized for detection and/or diagnosis of SARS-CoV-2 by FDA under an Emergency Use Authorization (EUA). This EUA will remain  in effect (meaning this test can be used) for the duration of the COVID-19 declaration under Section 564(b)(1) of the Act, 21 U.S.C.section 360bbb-3(b)(1), unless the authorization is terminated  or revoked sooner.       Influenza A by PCR NEGATIVE NEGATIVE Final   Influenza B by PCR NEGATIVE NEGATIVE Final    Comment: (NOTE) The Xpert Xpress SARS-CoV-2/FLU/RSV plus assay is intended as an aid in the diagnosis of influenza from Nasopharyngeal swab specimens and should not be used as a sole basis for treatment. Nasal washings and aspirates are unacceptable for Xpert Xpress SARS-CoV-2/FLU/RSV testing.  Fact Sheet for Patients: EntrepreneurPulse.com.au  Fact Sheet for Healthcare Providers: IncredibleEmployment.be  This test is not yet approved or cleared by the [place] FDA and has been authorized for detection and/or diagnosis of SARS-CoV-2 by FDA under an Emergency Use Authorization (EUA). This EUA will remain in effect (meaning this test can be used) for the duration of the COVID-19 declaration under Section 564(b)(1) of the Act, 21 U.S.C. section 360bbb-3(b)(1), unless the authorization is  terminated or revoked.  Performed at [facility], [place] [address], [place], [place] [address]      Radiological Exams on Admission: DG Pelvis Portable  Result Date: 07/06/2020 CLINICAL DATA:  Pain following fall EXAM: PORTABLE PELVIS 1-2 VIEWS COMPARISON:  None. FINDINGS: There is no evidence of pelvic fracture or dislocation. There is mild symmetric narrowing of each hip joint. No erosive change. Bone  somewhat osteoporotic. Degenerative change noted in lower lumbar spine. IMPRESSION: No evident acute fracture or dislocation. Osteoporosis. Mild symmetric narrowing of each hip joint. Electronically Signed   By: Lowella Grip III M.D.   On: 07/06/2020 09:23   DG Chest Portable 1 View  Result Date: 07/06/2020 CLINICAL DATA:  Fall, shortness of breath, lethargy, shoulder pain EXAM: PORTABLE CHEST 1 VIEW COMPARISON:  02/02/2017 FINDINGS: Stable cardiomegaly. Atherosclerotic calcification of the aortic knob. Hiatal hernia noted. Interstitial thickening, most pronounced within the left mid lung. No focal airspace consolidation, pleural effusion, or pneumothorax. IMPRESSION: 1. Interstitial thickening, most pronounced within the left mid lung. Findings favored to represent chronic bronchitic type lung changes. Superimposed mild edema or developing infection not excluded. 2. Cardiomegaly. 3. Hiatal hernia. Electronically Signed   By: Davina Poke D.O.   On: 07/06/2020 09:27   DG Shoulder Left Portable  Result Date: 07/06/2020 CLINICAL DATA:  Fall.  Shoulder pain. EXAM: LEFT SHOULDER COMPARISON:  No recent. FINDINGS: Acromioclavicular and glenohumeral degenerative change. No acute bony or joint abnormality. No evidence of fracture or dislocation. IMPRESSION: Acromioclavicular glenohumeral degenerative change. No acute bony abnormality. Electronically Signed   By: Marcello Moores  Register   On: 07/06/2020 09:30    EKG: Independently reviewed.  Sinus rhythm at 89 bpm  Assessment/Plan Congestive heart  failure exacerbation aortic stenosis: Acute.  Patient presents with complaints of shortness of breath and fatigue.  Labs significant for BNP 837.8.  Chest x-ray significant for significant for interstitial thickening more pronounced on the left mid lung which superimposed edema or developing infection could not be excluded.  Patient had been given Lasix 40 mg IV.  Previously seen back in 2006 by Dr. Pernell Dupre cardiology when she was initially diagnosed with aortic stenosis.  Patient's son states that she would not like to undergo any kind of surgical procedures. -Admit to a telemetry bed -Heart failure orders set  initiated  -Strict I&Os and daily weights -Elevate lower extremities -Lasix 40 mg IV Bid -Reassess in a.m. and adjust diuresis as needed. -Check echocardiogram  Chronic respiratory failure with hypoxia: At baseline patient is on 2 L nasal cannula oxygen. -Continuous pulse oximetry with nasal cannula oxygen as needed to keep O2 saturations >92%  Pancytopenia: Chronic.  WBC 3.4 and platelet count 147 which appears similar to previous. -Continue to monitor  Elevated troponin: Acute.  High-sensitivity troponin 39-> 35.  EKG without significant ischemic changes.  Suspect secondary to demand in setting of congestive heart failure exacerbation.  Patient does not report any complaints of chest pain. -Continue to monitor  Essential hypertension -Continue verapamil  Left shoulder and hip pain secondary to fall at home: Prior to arrival.  Patient complained of pain in left shoulder following the incident.  No acute fractures as noted, but did note degenerative changes -PT/OT to evaluate and treat -Lidocaine patches to left shoulder and hip -Consider consulting orthopedics for possible steroid injection if symptoms or not improved with therapies.  Decreased ability to take patient back and forth to providers  Osteoarthritis: Patient has degenerative changes of several joints. -Continue  symptomatic treatment  Chronic kidney disease stage IIIa: Stable. -Continue to monitor  Anxiety: At baseline patient on Xanax 0.25 mg 4 times daily as needed for anxiety and Zoloft 100 mg daily. -Continue Zoloft -Increase Xanax to 0.25-0.5 mg 4 times daily as needed  GERD -Pharmacy substitution of Protonix for Dexilant  DVT prophylaxis: Lovenox Code Status: Full Family Communication: Son updated at bedside Disposition Plan: Home Consults called:  Palliative care Admission status: Inpatient  Norval Morton MD [facility]   If 7PM-7AM, please contact night-coverage   07/06/2020, 11:16 AM

## 2020-07-06 NOTE — ED Notes (Signed)
Pt is asking for a stronger anxiety med. PRN Xanax given and Ativan given at 0938am as well. Admit team sent a secured chat at this time. Waiting for orders.

## 2020-07-07 DIAGNOSIS — I5023 Acute on chronic systolic (congestive) heart failure: Secondary | ICD-10-CM

## 2020-07-07 LAB — RESPIRATORY PANEL BY PCR

## 2020-07-07 LAB — BASIC METABOLIC PANEL
Anion gap: 11 (ref 5–15)
BUN: 21 mg/dL (ref 8–23)
CO2: 27 mmol/L (ref 22–32)
Calcium: 8.7 mg/dL — ABNORMAL LOW (ref 8.9–10.3)
Chloride: 96 mmol/L — ABNORMAL LOW (ref 98–111)
Creatinine, Ser: 1.29 mg/dL — ABNORMAL HIGH (ref 0.44–1.00)
GFR, Estimated: 39 mL/min — ABNORMAL LOW (ref >60)
Glucose, Bld: 105 mg/dL — ABNORMAL HIGH (ref 70–99)
Potassium: 3.6 mmol/L (ref 3.5–5.1)
Sodium: 134 mmol/L — ABNORMAL LOW (ref 135–145)

## 2020-07-07 LAB — VITAMIN B12: Vitamin B-12: 142 pg/mL — ABNORMAL LOW (ref 180–914)

## 2020-07-07 LAB — PROCALCITONIN: Procalcitonin: 0.48 ng/mL

## 2020-07-07 MED ORDER — SODIUM CHLORIDE 0.9 % IV SOLN
500.0000 mg | INTRAVENOUS | Status: DC
  Administered 2020-07-07 – 2020-07-09 (×3): 500 mg via INTRAVENOUS
  Filled 2020-07-07 (×3): qty 500

## 2020-07-07 MED ORDER — CYANOCOBALAMIN 1000 MCG/ML IJ SOLN: 1000.0000 ug | Freq: Once | INTRAMUSCULAR | Status: DC

## 2020-07-07 MED ORDER — CYANOCOBALAMIN 1000 MCG/ML IJ SOLN
1000.0000 ug | Freq: Every day | INTRAMUSCULAR | Status: DC
  Administered 2020-07-07 – 2020-07-09 (×3): 1000 ug via SUBCUTANEOUS
  Filled 2020-07-07 (×4): qty 1

## 2020-07-07 MED ORDER — PRAMIPEXOLE DIHYDROCHLORIDE 0.125 MG PO TABS
0.1250 mg | ORAL_TABLET | Freq: Every evening | ORAL | Status: DC
  Administered 2020-07-07: 0.125 mg via ORAL
  Filled 2020-07-07: qty 1

## 2020-07-07 MED ORDER — PRAMIPEXOLE DIHYDROCHLORIDE 0.125 MG PO TABS
0.1250 mg | ORAL_TABLET | Freq: Every day | ORAL | Status: DC
  Filled 2020-07-07: qty 1

## 2020-07-07 MED ORDER — SODIUM CHLORIDE 0.9 % IV SOLN
2.0000 g | INTRAVENOUS | Status: DC
  Administered 2020-07-07 – 2020-07-09 (×3): 2 g via INTRAVENOUS
  Filled 2020-07-07 (×4): qty 20

## 2020-07-07 NOTE — Evaluation (Signed)
Physical Therapy Evaluation Patient Details Name: Sophia [name] MRN: [id number] DOB: [birth year] Today's Date: 07/07/2020   History of Present Illness  Pt is a 87 y/o female admitted 07/06/20 for SOB. Admitted for CHF exacerbation. Chest x-ray significant for interstitial thickening on L mid lung.  Of note, with L shoulder and hip pain after fall at home, xrays normal.  PMH includes: CHF, aortic stenosis, chronic resp failure on 2L at baseline, OA, anxiety, cataracts, neuropathy.  Clinical Impression  Pt presents to PT with deficits in functional mobility, gait, balance, strength, power, endurance. Pt reports a significant fear of falling and has a history of past falls. Pt is generally weak and requires physical assistance to safely transfer, unable to clear L foot from floor to ambulate at this time. PT provides recommendation for SNF, however pt refuses, and based on palliative care note will likely D/C home with hospice services and assistance of her son. Pt will continue to benefit from acute PT services in an attempt to improve LE strength and reduce falls risk.    Follow Up Recommendations Home health PT (pt refusing PT recommendation for SNF, per palliative note pt and son expecting D/C home with hospice services)    Equipment Recommendations  Wheelchair (measurements PT);Wheelchair cushion (measurements PT);Hospital bed (pt will benefit from wheelchair if not already owned, conflicting DME reports between therapists)    Recommendations for Other Services       Precautions / Restrictions Precautions Precautions: Fall Restrictions Weight Bearing Restrictions: No      Mobility  Bed Mobility Overal bed mobility: Needs Assistance Bed Mobility: Supine to Sit;Sit to Supine     Supine to sit: Min guard;HOB elevated Sit to supine: Min guard        Transfers Overall transfer level: Needs assistance Equipment used: Rolling walker (2 wheeled);1 person hand held assist Transfers:  Sit to/from Stand Sit to Stand: Min assist;Mod assist         General transfer comment: modA without RW, minA with RW, pt declines stand pivot transfer attempts  Ambulation/Gait Ambulation/Gait assistance:  (pt unable to clear L foot when attempting marching at edge of bed, posterior lean noted)              Stairs            Wheelchair Mobility    Modified Rankin (Stroke Patients Only)       Balance Overall balance assessment: Needs assistance Sitting-balance support: No upper extremity supported;Feet supported Sitting balance-Leahy Scale: Good Sitting balance - Comments: pt able to lean forward to adjust shoe   Standing balance support: Bilateral upper extremity supported Standing balance-Leahy Scale: Poor Standing balance comment: minA and BUE support of RW                             Pertinent Vitals/Pain Pain Assessment: 0-10 Pain Score: 5  Pain Location: low back Pain Descriptors / Indicators: Aching Pain Intervention(s): Monitored during session    Home Living Family/patient expects to be discharged to:: Private residence Living Arrangements: Children Available Help at Discharge: Family;Available PRN/intermittently (son leaves intermittently to run errands) Type of Home: House Home Access: Ramped entrance     Home Layout: One level Home Equipment: Walker - 2 wheels;Bedside commode;Walker - 4 wheels;Shower seat Additional Comments: pt does not report owning a manual wheelchair during PT eval, had reported owning a wheelchair to OT earlier in day    Prior Function Level of  Independence: Needs assistance   Gait / Transfers Assistance Needed: typically ambulating short household distances with RW. Pt reports decline over past couple weeks resulting in no ambulation           Hand Dominance   Dominant Hand: Right    Extremity/Trunk Assessment   Upper Extremity Assessment Upper Extremity Assessment: Generalized weakness     Lower Extremity Assessment Lower Extremity Assessment: Generalized weakness    Cervical / Trunk Assessment Cervical / Trunk Assessment: Kyphotic  Communication   Communication: No difficulties  Cognition Arousal/Alertness: Awake/alert Behavior During Therapy: Anxious Overall Cognitive Status: No family/caregiver present to determine baseline cognitive functioning                                 General Comments: pt with some memory deficits, unable to provide timeline for mobility decline      General Comments General comments (skin integrity, edema, etc.): pt on 2L [place] upon arrival, PT weans to RA with sats from 92-95% with activity    Exercises     Assessment/Plan    PT Assessment Patient needs continued PT services  PT Problem List Decreased strength;Decreased activity tolerance;Decreased balance;Decreased mobility;Decreased knowledge of use of DME;Decreased safety awareness;Decreased knowledge of precautions;Cardiopulmonary status limiting activity       PT Treatment Interventions DME instruction;Gait training;Functional mobility training;Therapeutic activities;Therapeutic exercise;Balance training;Patient/family education    PT Goals (Current goals can be found in the Care Plan section)  Acute Rehab PT Goals Patient Stated Goal: to go home, reduce falls risk PT Goal Formulation: With patient Time For Goal Achievement: 07/21/20 Potential to Achieve Goals: Fair    Frequency Min 3X/week (pt refusing SNF, per palliative likely home with hospice services)   Barriers to discharge        Co-evaluation               AM-PAC PT "6 Clicks" Mobility  Outcome Measure Help needed turning from your back to your side while in a flat bed without using bedrails?: A Little Help needed moving from lying on your back to sitting on the side of a flat bed without using bedrails?: A Little Help needed moving to and from a bed to a chair (including a wheelchair)?:  A Lot Help needed standing up from a chair using your arms (e.g., wheelchair or bedside chair)?: A Little Help needed to walk in hospital room?: Total Help needed climbing 3-5 steps with a railing? : Total 6 Click Score: 13    End of Session Equipment Utilized During Treatment: Oxygen Activity Tolerance: Patient limited by fatigue;Other (comment) (anxiety) Patient left: in bed;with call bell/phone within reach;with bed alarm set Nurse Communication: Mobility status;Need for lift equipment (continued use of STEDY for transfers with nursing) PT Visit Diagnosis: Unsteadiness on feet (R26.81);Other abnormalities of gait and mobility (R26.89);Muscle weakness (generalized) (M62.81);History of falling (Z91.81)    Time: 4403-4742 PT Time Calculation (min) (ACUTE ONLY): 28 min   Charges:   PT Evaluation $PT Eval Low Complexity: 1 Low          Zenaida Niece, PT, DPT Acute Rehabilitation Pager: [phone]   Zenaida Niece 07/07/2020, 3:54 PM

## 2020-07-07 NOTE — Progress Notes (Signed)
[facility] PROGRESS NOTE    Progress Note  Sophia [name]  XJO:[id number] DOB: 25-Jul-1928 DOA: 07/06/2020 PCP: [name], MD     Brief Narrative:   Sophia [name] is an 87 y.o. female past medical history significant for EF of 40% moderate aortic stenosis chronic respiratory failure is on 2 L of oxygen, comes into the hospital complaining of shortness of breath started the day prior to admission.  Chest x-ray showed left midlung interstitial thickening, BNP of 800   Assessment/Plan:   Acute on chronic respiratory failure with hypoxia: Of unclear etiology, unlikely heart failure.  I suspect there is a pneumonic process either viral or bacterial. Her estimated dry weight is around 90 kg, on admission she is 81.7. Chest x-ray showing interstitial marking on the left with an elevated BNP.  On physical exam she has crackles in her right lung, and appears euvolemic.  Mild leukopenia she relate an occasional cough overnight. We will go ahead and stop her IV Lasix, started on Rocephin and azithromycin check a procalcitonin, blood cultures. Check respiratory virus panel She is only negative about 600 cc we will allow her oral hydration  Vitamin B12 deficiency: Will start on B12 142, will start B12 daily.  Chronic pancytopenia: Appears similar to previous.  Elevated troponins: She denies any chest pain, likely due to demand ischemia.  Essential hypertension: Continue verapamil.  Left shoulder pain and hip pain secondary to fall at home: Likely due to mechanical fall, consult PT OT.  Osteoarthritis: Continue symptomatic treatment.  Chronic kidney disease stage IIIa: Creatinine appears to be stable.   Essential hypertension     DVT prophylaxis: lovenox Family Communication:none Status is: Inpatient  Remains inpatient appropriate because:Hemodynamically unstable   Dispo: The patient is from: SNF              Anticipated d/c is to: SNF              Patient  currently is not medically stable to d/c.   Difficult to place patient No  Code Status:     Code Status Orders  (From admission, onward)         Start     Ordered   07/06/20 1209  Full code  Continuous        07/06/20 1208        Code Status History    Date Active Date Inactive Code Status Order ID Comments User Context   02/03/2017 0400 02/08/2017 1738 Full Code [id number]  Ivor Costa, MD ED   01/12/2016 1631 01/13/2016 1256 Full Code [id number]  [name], MD Inpatient   Advance Care Planning Activity        IV Access:    Peripheral IV   Procedures and diagnostic studies:   DG Pelvis Portable  Result Date: 07/06/2020 CLINICAL DATA:  Pain following fall EXAM: PORTABLE PELVIS 1-2 VIEWS COMPARISON:  None. FINDINGS: There is no evidence of pelvic fracture or dislocation. There is mild symmetric narrowing of each hip joint. No erosive change. Bone somewhat osteoporotic. Degenerative change noted in lower lumbar spine. IMPRESSION: No evident acute fracture or dislocation. Osteoporosis. Mild symmetric narrowing of each hip joint. Electronically Signed   By: Lowella Grip III M.D.   On: 07/06/2020 09:23   DG Chest Portable 1 View  Result Date: 07/06/2020 CLINICAL DATA:  Fall, shortness of breath, lethargy, shoulder pain EXAM: PORTABLE CHEST 1 VIEW COMPARISON:  02/02/2017 FINDINGS: Stable cardiomegaly. Atherosclerotic calcification of the aortic knob. Hiatal hernia noted.  Interstitial thickening, most pronounced within the left mid lung. No focal airspace consolidation, pleural effusion, or pneumothorax. IMPRESSION: 1. Interstitial thickening, most pronounced within the left mid lung. Findings favored to represent chronic bronchitic type lung changes. Superimposed mild edema or developing infection not excluded. 2. Cardiomegaly. 3. Hiatal hernia. Electronically Signed   By: Davina Poke D.O.   On: 07/06/2020 09:27   DG Shoulder Left Portable  Result Date: 07/06/2020 CLINICAL  DATA:  Fall.  Shoulder pain. EXAM: LEFT SHOULDER COMPARISON:  No recent. FINDINGS: Acromioclavicular and glenohumeral degenerative change. No acute bony or joint abnormality. No evidence of fracture or dislocation. IMPRESSION: Acromioclavicular glenohumeral degenerative change. No acute bony abnormality. Electronically Signed   By: Marcello Moores  Register   On: 07/06/2020 09:30   ECHOCARDIOGRAM COMPLETE  Result Date: 07/06/2020    ECHOCARDIOGRAM REPORT   Patient Name:   Sophia [name] Date of Exam: 07/06/2020 Medical Rec #:  [id number]         Height:       63.0 in Accession #:    [id number]        Weight:       187.0 lb Date of Birth:  [birth year]         BSA:          1.879 m Patient Age:    66 years          BP:           134/78 mmHg Patient Gender: F                 HR:           84 bpm. Exam Location:  Inpatient Procedure: 2D Echo, Cardiac Doppler and Color Doppler Indications:    CHF  History:        Patient has no prior history of Echocardiogram examinations.                 CHF, Aortic Valve Disease; Risk Factors:Dyslipidemia and                 Hypertension.  Sonographer:    Cammy Brochure Referring Phys: [id number] RONDELL A SMITH  Sonographer Comments: Image acquisition challenging due to patient body habitus. IMPRESSIONS  1. Left ventricular ejection fraction, by estimation, is 40 to 45%. The left ventricle has mildly decreased function. The left ventricle demonstrates global hypokinesis. There is moderate left ventricular hypertrophy. Left ventricular diastolic parameters are indeterminate.  2. Right ventricular systolic function is normal. The right ventricular size is normal. Tricuspid regurgitation signal is inadequate for assessing PA pressure.  3. Left atrial size was mildly dilated.  4. The mitral valve is normal in structure. Trivial mitral valve regurgitation. No evidence of mitral stenosis.  5. The aortic valve is abnormal. There is severe calcifcation of the aortic valve. Aortic valve regurgitation  is not visualized. The aortic valve has severe calcification with moderate aortic valve stenosis, however in the setting of low stroke volume index and DI of 0.20, this may represent low flow, low gradient severe aortic valve stenosis.. Aortic valve area, by VTI measures 0.62 cm. Aortic valve mean gradient measures 28.0 mmHg. Aortic valve Vmax measures 3.49 m/s. FINDINGS  Left Ventricle: Left ventricular ejection fraction, by estimation, is 40 to 45%. The left ventricle has mildly decreased function. The left ventricle demonstrates global hypokinesis. The left ventricular internal cavity size was normal in size. There is  moderate left ventricular hypertrophy. Abnormal (paradoxical) septal motion, consistent with left  bundle branch block. Left ventricular diastolic parameters are indeterminate. Right Ventricle: The right ventricular size is normal. No increase in right ventricular wall thickness. Right ventricular systolic function is normal. Tricuspid regurgitation signal is inadequate for assessing PA pressure. Left Atrium: Left atrial size was mildly dilated. Right Atrium: Right atrial size was normal in size. Pericardium: There is no evidence of pericardial effusion. Mitral Valve: The mitral valve is normal in structure. Trivial mitral valve regurgitation. No evidence of mitral valve stenosis. Tricuspid Valve: The tricuspid valve is normal in structure. Tricuspid valve regurgitation is trivial. No evidence of tricuspid stenosis. Aortic Valve: The aortic valve is abnormal. There is severe calcifcation of the aortic valve. Aortic valve regurgitation is not visualized. The aortic valve has severe calcification with moderate aortic valve stenosis, however in the setting of low stroke volume index and DI of 0.20, this may represent low flow, low gradient severe aortic valve stenosis. Aortic valve mean gradient measures 28.0 mmHg. Aortic valve peak gradient measures 48.6 mmHg. Aortic valve area, by VTI measures 0.62  cm. Pulmonic Valve: The pulmonic valve was normal in structure. Pulmonic valve regurgitation is trivial. No evidence of pulmonic stenosis. Aorta: The aortic root is normal in size and structure. Venous: The inferior vena cava was not well visualized. IAS/Shunts: No atrial level shunt detected by color flow Doppler.  LEFT VENTRICLE PLAX 2D LVIDd:         4.60 cm  Diastology LVIDs:         3.50 cm  LV e' medial:    6.09 cm/s LV PW:         1.30 cm  LV E/e' medial:  16.0 LV IVS:        1.50 cm  LV e' lateral:   5.55 cm/s LVOT diam:     2.00 cm  LV E/e' lateral: 17.6 LV SV:         43 LV SV Index:   23 LVOT Area:     3.14 cm  RIGHT VENTRICLE RV S prime:     12.70 cm/s LEFT ATRIUM             Index       RIGHT ATRIUM           Index LA diam:        3.90 cm 2.08 cm/m  RA Area:     13.10 cm LA Vol (A2C):   55.8 ml 29.69 ml/m RA Volume:   32.20 ml  17.14 ml/m LA Vol (A4C):   69.0 ml 36.72 ml/m LA Biplane Vol: 62.2 ml 33.10 ml/m  AORTIC VALVE AV Area (Vmax):    0.66 cm AV Area (Vmean):   0.76 cm AV Area (VTI):     0.62 cm AV Vmax:           348.70 cm/s AV Vmean:          204.000 cm/s AV VTI:            0.700 m AV Peak Grad:      48.6 mmHg AV Mean Grad:      28.0 mmHg LVOT Vmax:         73.70 cm/s LVOT Vmean:        49.300 cm/s LVOT VTI:          0.138 m LVOT/AV VTI ratio: 0.20  AORTA Ao Root diam: 3.50 cm Ao Asc diam:  3.00 cm MITRAL VALVE MV Area (PHT): 5.23 cm    SHUNTS MV Decel Time:  145 msec    Systemic VTI:  0.14 m MV E velocity: 97.70 cm/s  Systemic Diam: 2.00 cm Cherlynn Kaiser MD Electronically signed by Cherlynn Kaiser MD Signature Date/Time: 07/06/2020/10:38:41 PM    Final      Medical Consultants:    None.   Subjective:    Sophia [name] she relates her breathing is unchanged compared to yesterday.  Objective:    Vitals:   07/06/20 2346 07/07/20 0542 07/07/20 0545 07/07/20 0753  BP: (!) 145/74  98/79 117/75  Pulse: 87  80 91  Resp: 18  18 17   Temp: 97.8 F (36.6 C)  97.8 F  (36.6 C) 97.9 F (36.6 C)  TempSrc: Oral  Oral Oral  SpO2: 97%  95% 97%  Weight:  81.7 kg     SpO2: 97 % O2 Flow Rate (L/min): 2 L/min   Intake/Output Summary (Last 24 hours) at 07/07/2020 0812 Last data filed at 07/07/2020 0000 Gross per 24 hour  Intake 480 ml  Output 1100 ml  Net -620 ml   Filed Weights   07/07/20 0542  Weight: 81.7 kg    Exam: General exam: In no acute distress. Respiratory system: Good air movement and crackles at the right middle lung Cardiovascular system: S1 & S2 heard, RRR. No JVD.  Negative hepatojugular reflux Gastrointestinal system: Abdomen is nondistended, soft and nontender.  Extremities: No pedal edema. Skin: No rashes, lesions or ulcers Psychiatry: Judgement and insight appear normal. Mood & affect appropriate.    Data Reviewed:    Labs: Basic Metabolic Panel: Recent Labs  Lab 07/06/20 0825 07/07/20 0405  NA 136 134*  K 4.0 3.6  CL 103 96*  CO2 27 27  GLUCOSE 102* 105*  BUN 17 21  CREATININE 1.09* 1.29*  CALCIUM 8.8* 8.7*   GFR Estimated Creatinine Clearance: 29.3 mL/min (A) (by C-G formula based on SCr of 1.29 mg/dL (H)). Liver Function Tests: No results for input(s): AST, ALT, ALKPHOS, BILITOT, PROT, ALBUMIN in the last 168 hours. No results for input(s): LIPASE, AMYLASE in the last 168 hours. No results for input(s): AMMONIA in the last 168 hours. Coagulation profile No results for input(s): INR, PROTIME in the last 168 hours. COVID-19 Labs  No results for input(s): DDIMER, FERRITIN, LDH, CRP in the last 72 hours.  Lab Results  Component Value Date   [place] NEGATIVE 07/06/2020    CBC: Recent Labs  Lab 07/06/20 0825  WBC 3.4*  NEUTROABS 2.6  HGB 13.7  HCT 42.3  MCV 103.2*  PLT 147*   Cardiac Enzymes: No results for input(s): CKTOTAL, CKMB, CKMBINDEX, TROPONINI in the last 168 hours. BNP (last 3 results) No results for input(s): PROBNP in the last 8760 hours. CBG: No results for input(s): GLUCAP in  the last 168 hours. D-Dimer: No results for input(s): DDIMER in the last 72 hours. Hgb A1c: No results for input(s): HGBA1C in the last 72 hours. Lipid Profile: No results for input(s): CHOL, HDL, LDLCALC, TRIG, CHOLHDL, LDLDIRECT in the last 72 hours. Thyroid function studies: No results for input(s): TSH, T4TOTAL, T3FREE, THYROIDAB in the last 72 hours.  Invalid input(s): FREET3 Anemia work up: Recent Labs    07/07/20 0405  VITAMINB12 142*   Sepsis Labs: Recent Labs  Lab 07/06/20 0825  WBC 3.4*   Microbiology Recent Results (from the past 240 hour(s))  Resp Panel by RT-PCR (Flu A&B, Covid) Nasopharyngeal Swab     Status: None   Collection Time: 07/06/20  8:51 AM   Specimen:  Nasopharyngeal Swab; Nasopharyngeal(NP) swabs in vial transport medium  Result Value Ref Range Status   SARS Coronavirus 2 by RT PCR NEGATIVE NEGATIVE Final    Comment: (NOTE) SARS-CoV-2 target nucleic acids are NOT DETECTED.  The SARS-CoV-2 RNA is generally detectable in upper respiratory specimens during the acute phase of infection. The lowest concentration of SARS-CoV-2 viral copies this assay can detect is 138 copies/mL. A negative result does not preclude SARS-Cov-2 infection and should not be used as the sole basis for treatment or other patient management decisions. A negative result may occur with  improper specimen collection/handling, submission of specimen other than nasopharyngeal swab, presence of viral mutation(s) within the areas targeted by this assay, and inadequate number of viral copies(<138 copies/mL). A negative result must be combined with clinical observations, patient history, and epidemiological information. The expected result is Negative.  Fact Sheet for Patients:  EntrepreneurPulse.com.au  Fact Sheet for Healthcare Providers:  IncredibleEmployment.be  This test is no t yet approved or cleared by the [place] FDA and  has  been authorized for detection and/or diagnosis of SARS-CoV-2 by FDA under an Emergency Use Authorization (EUA). This EUA will remain  in effect (meaning this test can be used) for the duration of the COVID-19 declaration under Section 564(b)(1) of the Act, 21 U.S.C.section 360bbb-3(b)(1), unless the authorization is terminated  or revoked sooner.       Influenza A by PCR NEGATIVE NEGATIVE Final   Influenza B by PCR NEGATIVE NEGATIVE Final    Comment: (NOTE) The Xpert Xpress SARS-CoV-2/FLU/RSV plus assay is intended as an aid in the diagnosis of influenza from Nasopharyngeal swab specimens and should not be used as a sole basis for treatment. Nasal washings and aspirates are unacceptable for Xpert Xpress SARS-CoV-2/FLU/RSV testing.  Fact Sheet for Patients: EntrepreneurPulse.com.au  Fact Sheet for Healthcare Providers: IncredibleEmployment.be  This test is not yet approved or cleared by the [place] FDA and has been authorized for detection and/or diagnosis of SARS-CoV-2 by FDA under an Emergency Use Authorization (EUA). This EUA will remain in effect (meaning this test can be used) for the duration of the COVID-19 declaration under Section 564(b)(1) of the Act, 21 U.S.C. section 360bbb-3(b)(1), unless the authorization is terminated or revoked.  Performed at [facility], [place] [address], [place], [place] [address]      Medications:   . enoxaparin (LOVENOX) injection  40 mg Subcutaneous Q24H  . furosemide  40 mg Intravenous BID  . lidocaine  1 patch Transdermal Q24H  . lidocaine  1 patch Transdermal Q24H  . pantoprazole  40 mg Oral Daily  . sertraline  100 mg Oral Daily  . sodium chloride flush  3 mL Intravenous Q12H  . verapamil  240 mg Oral Daily   Continuous Infusions: . sodium chloride        LOS: 1 day   Charlynne Cousins  [facility]  07/07/2020, 8:12 AM

## 2020-07-07 NOTE — Progress Notes (Signed)
New iv placed in left arm antibiotics resumed

## 2020-07-07 NOTE — Evaluation (Signed)
Occupational Therapy Evaluation Patient Details Name: Sophia [name] MRN: [id number] DOB: 09-Sep-1928 Today's Date: 07/07/2020    History of Present Illness Pt is a 87 y/o female admitted 07/06/20 for SOB. Admitted for CHF exacerbation. Chest x-ray significant for interstitial thickening on L mid lung.  Of note, with L shoulder and hip pain after fall at home, xrays normal.  PMH includes: CHF, aortic stenosis, chronic resp failure on 2L at baseline, OA, anxiety, cataracts, neuropathy.   Clinical Impression   Patient admitted for above and presenting with problem list below, including impaired balance, decreased activity tolerance, generalized weakness, and anxiety/fear of falling.  Patients son present and reports progressive decline over the last 2-3 weeks, pt was able to manage basic ADLs and mobility to/from restroom using RW with independence but has required increased assistance.  Patient currently requires mod assist for transfers (sit to stand) and up to max assist for ADLs.  Based on performance today, believe she will benefit from further OT services while admitted and after dc at SNF level to optimize return to PLOF with ADLs, mobility and decrease burden of care upon return home.     Follow Up Recommendations  SNF;Supervision/Assistance - 24 hour    Equipment Recommendations  Other (comment) (TBD at next venue of care)    Recommendations for Other Services       Precautions / Restrictions Precautions Precautions: Fall Restrictions Weight Bearing Restrictions: No      Mobility Bed Mobility               General bed mobility comments: OOB in recliner upon entry    Transfers Overall transfer level: Needs assistance Equipment used: Rolling walker (2 wheeled) Transfers: Sit to/from Stand Sit to Stand: Mod assist         General transfer comment: pt completed sit to stand (3x) from recliner with up to mod assist with cueing for hand placement and safety, assist to  power up and steady with increased fear of falling.    Balance Overall balance assessment: Needs assistance Sitting-balance support: No upper extremity supported;Feet supported Sitting balance-Leahy Scale: Good     Standing balance support: Bilateral upper extremity supported;During functional activity Standing balance-Leahy Scale: Poor Standing balance comment: relies on BUE and external support                           ADL either performed or assessed with clinical judgement   ADL Overall ADL's : Needs assistance/impaired     Grooming: Set up;Sitting   Upper Body Bathing: Set up;Sitting   Lower Body Bathing: Moderate assistance;Sit to/from stand   Upper Body Dressing : Minimal assistance;Sitting   Lower Body Dressing: Maximal assistance;Sit to/from stand Lower Body Dressing Details (indicate cue type and reason): able to reach feet but would require assist to don socks, relies on BUE support in standing and mod assist to stand   Toilet Transfer Details (indicate cue type and reason): deferred due to safety, marching in place with min assist for balance pt declines further mobility         Functional mobility during ADLs: Moderate assistance;Rolling walker;Cueing for safety General ADL Comments: pt limited by weakness, balance, decreased activity tolerance, pain     Vision   Vision Assessment?: No apparent visual deficits     Perception     Praxis      Pertinent Vitals/Pain Pain Assessment: Faces Faces Pain Scale: Hurts little more Pain Location: L hip  Pain Descriptors / Indicators: Discomfort;Grimacing;Guarding Pain Intervention(s): Limited activity within patient's tolerance;Monitored during session;Repositioned     Hand Dominance Right   Extremity/Trunk Assessment Upper Extremity Assessment Upper Extremity Assessment: Generalized weakness   Lower Extremity Assessment Lower Extremity Assessment: Defer to PT evaluation       Communication  Communication Communication: HOH   Cognition Arousal/Alertness: Awake/alert Behavior During Therapy: WFL for tasks assessed/performed Overall Cognitive Status: Within Functional Limits for tasks assessed                                     General Comments  pt marching in standing with poor clearance for B feet, voices anxious and highly fearful of falling    Exercises     Shoulder Instructions      Home Living Family/patient expects to be discharged to:: Private residence Living Arrangements: Children Available Help at Discharge: Family;Available PRN/intermittently Type of Home: House Home Access: Ramped entrance     Home Layout: One level     Bathroom Shower/Tub: Tub/shower unit (does not shower)   Bathroom Toilet: Handicapped height     Home Equipment: Environmental consultant - 2 wheels;Bedside commode;Wheelchair - manual   Additional Comments: son stays most of the time with pt; 2L at baseline      Prior Functioning/Environment Level of Independence: Needs assistance  Gait / Transfers Assistance Needed: using RW for mobility ADL's / Homemaking Assistance Needed: setup for basin bathing, some assist for toileting hygiene, able to ambulate to bathroom without assist   Comments: decline over the last 2 weeks        OT Problem List: Decreased strength;Decreased activity tolerance;Impaired balance (sitting and/or standing);Decreased safety awareness;Decreased knowledge of use of DME or AE;Decreased knowledge of precautions;Cardiopulmonary status limiting activity;Obesity;Pain      OT Treatment/Interventions: Self-care/ADL training;DME and/or AE instruction;Therapeutic activities;Patient/family education;Balance training;Therapeutic exercise;Energy conservation    OT Goals(Current goals can be found in the care plan section) Acute Rehab OT Goals Patient Stated Goal: to get home OT Goal Formulation: With patient Time For Goal Achievement: 07/21/20 Potential to  Achieve Goals: Good  OT Frequency: Min 2X/week   Barriers to D/C:            Co-evaluation              AM-PAC OT "6 Clicks" Daily Activity     Outcome Measure Help from another person eating meals?: A Little Help from another person taking care of personal grooming?: A Little Help from another person toileting, which includes using toliet, bedpan, or urinal?: A Lot Help from another person bathing (including washing, rinsing, drying)?: A Lot Help from another person to put on and taking off regular upper body clothing?: A Little Help from another person to put on and taking off regular lower body clothing?: A Lot 6 Click Score: 15   End of Session Equipment Utilized During Treatment: Rolling walker;Oxygen (2L) Nurse Communication: Mobility status  Activity Tolerance: Patient tolerated treatment well Patient left: in chair;with call bell/phone within reach;with family/visitor present;with nursing/sitter in room  OT Visit Diagnosis: Other abnormalities of gait and mobility (R26.89);Muscle weakness (generalized) (M62.81);Pain;History of falling (Z91.81) Pain - Right/Left: Left Pain - part of body: Hip                Time: 8099-8338 OT Time Calculation (min): 28 min Charges:  OT General Charges $OT Visit: 1 Visit OT Evaluation $OT Eval Moderate Complexity: 1 Mod  OT Treatments $Self Care/Home Management : 8-22 mins  Jolaine Artist, OT Acute Rehabilitation Services Pager [phone] Office [phone]   Delight Stare 07/07/2020, 12:32 PM

## 2020-07-07 NOTE — Progress Notes (Signed)
Heart Failure Navigator Progress Note  Assessed for Heart & Vascular TOC clinic readiness.  Unfortunately at this time the patient does not meet criteria due to symptom presentation thought to be more likely due to infectious etiology - viral or bacterial pneumonia. LVEF 40-45% on ECHO from yesterday.   Navigator available for reassessment of patient.   Kerby Nora, PharmD, BCPS Heart Failure Stewardship Pharmacist Phone [phone]

## 2020-07-07 NOTE — Consult Note (Signed)
Consultation Note Date: 07/07/2020   Patient Name: Sophia [name]  DOB: 30-Jul-1928  MRN: [id number]  Age / Sex: 87 y.o., female  PCP: [name], MD Referring Physician: Aileen Fass, Tammi Klippel, MD  Reason for Consultation: Establishing goals of care  HPI/Patient Profile: 87 y.o. female  with past medical history of  EF of 40%, moderate aortic stenosis, chronic respiratory failure on 2 L of oxygen, OA, and anxiety admitted on 07/06/2020 with shortness of breath. Chest x-ray showed left midlung interstitial thickening. Labs revealed BNP of 800. Initially treated for HF exacerbation. Now being treated for infectious process. PMT consulted for [place].   Clinical Assessment and Goals of Care: I have reviewed medical records including EPIC notes, labs and imaging, assessed the patient and then met with patient and [name] to discuss diagnosis prognosis, [place], EOL wishes, disposition and options.  I introduced Palliative Medicine as specialized medical care for people living with serious illness. It focuses on providing relief from the symptoms and stress of a serious illness. The goal is to improve quality of life for both the patient and the family.  Sophia [name] reports he lives with patient and has provided 24/7 care for her for the past 3 years. He tells me her spouse passed away in 24-Mar-2004. Patient has 3 children but the other two are estranged.   As far as functional and nutritional status, patient can walk with walker. Able to toilet herself. Unable to bathe independently. Good appetite. Some decline in function over the past couple of weeks with a couple of falls this year. She has had severe anxiety the last couple of weeks. Patient describes restless leg syndrome and tells me this keeps her up at night.    We discussed patient's current illness and what it means in the larger context of patient's on-going co-morbidities.  Natural disease trajectory and  expectations at EOL were discussed. We discuss her decline in functional status along with her heart disease.   I attempted to elicit values and goals of care important to the patient. The difference between aggressive medical intervention and comfort care was considered in light of the patient's goals of care.   Advance directives, concepts specific to code status, artificial feeding and hydration, and rehospitalization were considered and discussed.  Encouraged patient/family to consider DNR/DNI status understanding evidenced based poor outcomes in similar hospitalized patients, as the cause of the arrest is likely associated with chronic/terminal disease rather than a reversible acute cardio-pulmonary event.   I completed a MOST form today. The patient and family outlined their wishes for the following treatment decisions:  Cardiopulmonary Resuscitation: Do Not Attempt Resuscitation (DNR/No CPR)  Medical Interventions: Comfort Measures: Keep clean, warm, and dry. Use medication by any route, positioning, wound care, and other measures to relieve pain and suffering. Use oxygen, suction and manual treatment of airway obstruction as needed for comfort. Do not transfer to the hospital unless comfort needs cannot be met in current location.  Antibiotics: Determine use of limitation of antibiotics when infection occurs  IV Fluids: No IV fluids (provide other measures to ensure comfort)  Feeding Tube: No feeding tube    Discussed with patient/family the importance of continued conversation with family and the medical providers regarding overall plan of care and treatment options, ensuring decisions are within the context of the patient's values and GOCs.    Hospice services outpatient were explained and offered. Discussed differences between hospice and home health. Patient and son both express interest in initiating hospice  services upon discharge.   Questions and concerns were addressed. The  family was encouraged to call with questions or concerns.   Primary Decision Maker PATIENT  [name] if patient unable  SUMMARY OF RECOMMENDATIONS   - MOST completed as above, code status changed to DNR - TOC consult to arrange hospice services at home upon discharge, will need hospital bed - Spiritual care consult to complete advance directive per patient and son request - patient complains on RLS - initiate mirapex at 0.125 mg qpm, can increase to 0.25 mg in a few days if symptoms uncontrolled  Code Status/Advance Care Planning:  DNR  Prognosis:   Unable to determine  Discharge Planning: Home with Hospice      Primary Diagnoses: Present on Admission: . Respiratory failure, chronic ([place]) . Osteoarthritis . GASTROESOPHAGEAL REFLUX DISEASE . Essential hypertension . CKD (chronic kidney disease), stage III ([place]) . Elevated troponin   I have reviewed the medical record, interviewed the patient and family, and examined the patient. The following aspects are pertinent.  Past Medical History:  Diagnosis Date  . Anemia   . Anxiety   . Aortic stenosis   . Arthritis   . Blood transfusion   . Cataracts, bilateral    removed  . Congestive heart failure ([place])   . Depression   . Diverticulosis 03/14/10  . Duodenitis 03/15/10   per capsule endoscopy report  . DVT (deep venous thrombosis) ([place])    pt denies  . Dyslipidemia   . Falls    frequent falls  . GAVE (gastric antral vascular ectasia)   . GERD (gastroesophageal reflux disease)   . Heart murmur   . Hemorrhagic gastritis 03/15/10   per capsule endoscopy report  . Hiatal hernia   . Hyperlipidemia   . Hyperplastic colon polyp   . Hypertension   . Neuropathy    bil big toes  . Osteoporosis   . Presbyesophagus   . Unspecified deficiency anemia 08/24/2013   Iron infusion    Social History   Socioeconomic History  . Marital status: Widowed    Spouse name: Not on file  . Number of children: Not on file  . Years  of education: Not on file  . Highest education level: Not on file  Occupational History  . Occupation: retired  Tobacco Use  . Smoking status: Former Smoker    Packs/day: 0.50    Years: 20.00    Pack years: 10.00    Types: Cigarettes    Quit date: 01/08/1989    Years since quitting: 31.5  . Smokeless tobacco: Never Used  Vaping Use  . Vaping Use: Never used  Substance and Sexual Activity  . Alcohol use: No    Alcohol/week: 0.0 standard drinks  . Drug use: No  . Sexual activity: Never    Birth control/protection: Post-menopausal  Other Topics Concern  . Not on file  Social History Narrative  . Not on file   Social Determinants of Health   Financial Resource Strain: Not on file  Food Insecurity: Not on file  Transportation Needs: Not on file  Physical Activity: Not on file  Stress: Not on file  Social Connections: Not on file   Family History  Problem Relation Age of Onset  . Prostate cancer Father   . Uterine cancer Sister   . Kidney cancer Brother   . Kidney cancer Son   . Colon cancer Sister        mets from uterine cancer   Scheduled  Meds: . cyanocobalamin  1,000 mcg Subcutaneous Daily  . enoxaparin (LOVENOX) injection  40 mg Subcutaneous Q24H  . lidocaine  1 patch Transdermal Q24H  . lidocaine  1 patch Transdermal Q24H  . pantoprazole  40 mg Oral Daily  . pramipexole  0.125 mg Oral QHS  . sertraline  100 mg Oral Daily  . sodium chloride flush  3 mL Intravenous Q12H  . verapamil  240 mg Oral Daily   Continuous Infusions: . sodium chloride    . azithromycin    . cefTRIAXone (ROCEPHIN)  IV 2 g (07/07/20 1327)   PRN Meds:.sodium chloride, acetaminophen, ALPRAZolam, gabapentin, ondansetron (ZOFRAN) IV, sodium chloride flush Allergies  Allergen Reactions  . Nutritional Supplements Anaphylaxis  . [name] Shortness Of Breath  . [name] [Alendronate Sodium] Other (See Comments)    Reaction:  GI upset   . Lipitor [Atorvastatin] Other (See  Comments)    Reaction:  Muscle aches   . Pregabalin Nausea And Vomiting  . [name] Nausea And Vomiting  . Wasp Venom Hives   Review of Systems  Constitutional: Positive for activity change.  Musculoskeletal:       Restless legs  Neurological: Positive for weakness.  Psychiatric/Behavioral: The patient is nervous/anxious.     Physical Exam Constitutional:      General: She is not in acute distress. Pulmonary:     Effort: Pulmonary effort is normal.  Skin:    General: Skin is warm and dry.  Neurological:     Mental Status: She is alert and oriented to person, place, and time.     Vital Signs: BP 96/76 (BP Location: Left Arm)   Pulse (!) 59   Temp 97.6 F (36.4 C)   Resp 16   Wt 81.7 kg   SpO2 96%   BMI 31.91 kg/m  Pain Scale: 0-10   Pain Score: 0-No pain   SpO2: SpO2: 96 % O2 Device:SpO2: 96 % O2 Flow Rate: .O2 Flow Rate (L/min): 2 L/min  IO: Intake/output summary:   Intake/Output Summary (Last 24 hours) at 07/07/2020 1340 Last data filed at 07/07/2020 1100 Gross per 24 hour  Intake 720 ml  Output 1100 ml  Net -380 ml    LBM: Last BM Date: 07/06/20 Baseline Weight: Weight: 81.7 kg Most recent weight: Weight: 81.7 kg     Palliative Assessment/Data: PPS 50%    Time Total: 85 minutes Greater than 50%  of this time was spent counseling and coordinating care related to the above assessment and plan.  Juel Burrow, DNP, AGNP-C Palliative Medicine Team [phone] Pager: [phone]

## 2020-07-08 LAB — PROCALCITONIN: Procalcitonin: 0.43 ng/mL

## 2020-07-08 LAB — BASIC METABOLIC PANEL
Anion gap: 12 (ref 5–15)
BUN: 35 mg/dL — ABNORMAL HIGH (ref 8–23)
CO2: 28 mmol/L (ref 22–32)
Calcium: 8.4 mg/dL — ABNORMAL LOW (ref 8.9–10.3)
Chloride: 94 mmol/L — ABNORMAL LOW (ref 98–111)
Creatinine, Ser: 2.09 mg/dL — ABNORMAL HIGH (ref 0.44–1.00)
GFR, Estimated: 22 mL/min — ABNORMAL LOW (ref >60)
Glucose, Bld: 110 mg/dL — ABNORMAL HIGH (ref 70–99)
Potassium: 3.6 mmol/L (ref 3.5–5.1)
Sodium: 134 mmol/L — ABNORMAL LOW (ref 135–145)

## 2020-07-08 MED ORDER — SODIUM CHLORIDE 0.9 % IV SOLN: INTRAVENOUS | Status: DC

## 2020-07-08 MED ORDER — ROPINIROLE HCL 0.25 MG PO TABS: 0.2500 mg | ORAL_TABLET | Freq: Every day | ORAL | Status: DC

## 2020-07-08 MED ORDER — SODIUM CHLORIDE 0.9 % IV BOLUS
250.0000 mL | Freq: Once | INTRAVENOUS | Status: AC
  Administered 2020-07-08: 250 mL via INTRAVENOUS

## 2020-07-08 MED ORDER — SODIUM CHLORIDE 0.9 % IV SOLN: INTRAVENOUS | Status: AC

## 2020-07-08 MED ORDER — ROPINIROLE HCL 0.25 MG PO TABS
0.2500 mg | ORAL_TABLET | Freq: Every evening | ORAL | Status: DC
  Administered 2020-07-08 – 2020-07-13 (×6): 0.25 mg via ORAL
  Filled 2020-07-08 (×6): qty 1

## 2020-07-08 MED ORDER — ENOXAPARIN SODIUM 30 MG/0.3ML IJ SOSY
30.0000 mg | PREFILLED_SYRINGE | INTRAMUSCULAR | Status: DC
  Administered 2020-07-08 – 2020-07-14 (×7): 30 mg via SUBCUTANEOUS
  Filled 2020-07-08 (×7): qty 0.3

## 2020-07-08 NOTE — Plan of Care (Signed)
?  Problem: Activity: ?Goal: Capacity to carry out activities will improve ?Outcome: Progressing ?  ?

## 2020-07-08 NOTE — Progress Notes (Signed)
**Note Sophia-Identified via Obfuscation** Physical Therapy Treatment Patient Details Name: Sophia [name] MRN: [id number] DOB: 11/06/1928 Today's Date: 07/08/2020    History of Present Illness Pt is a 87 y/o female admitted 07/06/20 for SOB. Admitted for CHF exacerbation. Chest x-ray significant for interstitial thickening on L mid lung.  Of note, with L shoulder and hip pain after fall at home, xrays normal.  PMH includes: CHF, aortic stenosis, chronic resp failure on 2L at baseline, OA, anxiety, cataracts, neuropathy.    PT Comments    Pt remains significantly limited by a fear of falling, expressing great anxiety during session. Pt demonstrates fair strength, and with encouragement and some physical assistance is able to stand during this session, however only for a brief period due to fear of falling. PT provides cueing and attempts to calm the patient in order to attempt further mobility, however the pt declines more mobility at this time. Pt will benefit from continued acute PT POC to improve transfer quality and reduce falls risk. PT continues to recommend SNF placement however pt and family goals are to return home. PT recommends use of a mechanical lift for all out of bed transfers.   Follow Up Recommendations  Home health PT (pt/son declining SNF placement)     Equipment Recommendations  Wheelchair (measurements PT);Wheelchair cushion (measurements PT);Hospital bed (mechanical lift)    Recommendations for Other Services       Precautions / Restrictions Precautions Precautions: Fall Restrictions Weight Bearing Restrictions: No    Mobility  Bed Mobility Overal bed mobility: Needs Assistance Bed Mobility: Supine to Sit;Sit to Supine;Rolling Rolling: Min assist   Supine to sit: Min assist;HOB elevated Sit to supine: Min assist   General bed mobility comments: minA for trunk elevation into sitting and LE elevation upon return to supine    Transfers Overall transfer level: Needs assistance Equipment used: 1  person hand held assist Transfers: Sit to/from Stand Sit to Stand: Min assist;Mod assist         General transfer comment: pt initiates 4 sit to stand attempts, successfully standing once with minA, all 3 other attempts aborted by pt due to anxiety and fear of falling  Ambulation/Gait Ambulation/Gait assistance:  (pt declines attempts 2/2 anxiety)               Stairs             Wheelchair Mobility    Modified Rankin (Stroke Patients Only)       Balance Overall balance assessment: Needs assistance Sitting-balance support: No upper extremity supported;Feet supported Sitting balance-Leahy Scale: Fair     Standing balance support: Bilateral upper extremity supported Standing balance-Leahy Scale: Poor Standing balance comment: minA with BUE support for brief 2-3 second period                            Cognition Arousal/Alertness: Awake/alert Behavior During Therapy: Anxious Overall Cognitive Status: No family/caregiver present to determine baseline cognitive functioning                                 General Comments: pt follows commands well, oriented x4, cognition appropriate for session despite significant anxiety      Exercises      General Comments General comments (skin integrity, edema, etc.): pt on 2L [place] throughout session, sats stable      Pertinent Vitals/Pain Pain Assessment: Faces Faces Pain Scale: Hurts little  more Pain Location: L shoulder, low back Pain Descriptors / Indicators: Grimacing Pain Intervention(s): Monitored during session    Home Living                      Prior Function            PT Goals (current goals can now be found in the care plan section) Acute Rehab PT Goals Patient Stated Goal: to go home, reduce falls risk Progress towards PT goals: Not progressing toward goals - comment (limited by anxiety)    Frequency    Min 3X/week (may need to reduce to 2x/week if progress  remains limited and plan remains to return home with hospice assistance next week)      PT Plan Current plan remains appropriate    Co-evaluation              AM-PAC PT "6 Clicks" Mobility   Outcome Measure  Help needed turning from your back to your side while in a flat bed without using bedrails?: A Little Help needed moving from lying on your back to sitting on the side of a flat bed without using bedrails?: A Little Help needed moving to and from a bed to a chair (including a wheelchair)?: A Lot Help needed standing up from a chair using your arms (e.g., wheelchair or bedside chair)?: A Lot Help needed to walk in hospital room?: Total Help needed climbing 3-5 steps with a railing? : Total 6 Click Score: 12    End of Session Equipment Utilized During Treatment: Oxygen Activity Tolerance: Other (comment) (pt limited by anxiety) Patient left: in bed;with call bell/phone within reach;with bed alarm set Nurse Communication: Mobility status;Need for lift equipment PT Visit Diagnosis: Unsteadiness on feet (R26.81);Other abnormalities of gait and mobility (R26.89);Muscle weakness (generalized) (M62.81);History of falling (Z91.81)     Time: 8832-5498 PT Time Calculation (min) (ACUTE ONLY): 27 min  Charges:  $Therapeutic Activity: 23-37 mins                     Zenaida Niece, PT, DPT Acute Rehabilitation Pager: [phone]    Zenaida Niece 07/08/2020, 4:26 PM

## 2020-07-08 NOTE — Progress Notes (Signed)
This chaplain responded to PMT consult to create or update an Advance Directive.  The Pt. is awake and conversational. The Pt. wants to defer the conversation of an AD, but is easily redirected by the Pt. son-[name] who is at the bedside.  HCPOA education was completed with the Pt. and the document was filled out naming Ladell Pier as the Pt. healthcare decision maker . A notary is not available to notarize today.  The chaplain will revisit notarizing HCPOA on Monday. Options to notarize the HCPOA outside the hospital were explored with [name].  The AD was placed in the Pt. Chart.  The chaplain listened as the Pt. expressed her desire to go home and return to her favorite chair and book.  The chaplain understands [name] requests quality communication, time, and resources to transition home with Hospice services.  The chaplain affirmed the medical team efforts in assisting [name] in taking the Pt. home.   This chaplain is available for F/U spiritual care as needed.

## 2020-07-08 NOTE — Progress Notes (Signed)
[facility] PROGRESS NOTE    Progress Note  Sophia [name]  IRC:[id number] DOB: 03-19-1928 DOA: 07/06/2020 PCP: [name], MD     Brief Narrative:   Sophia [name] is an 87 y.o. female past medical history significant for EF of 40% moderate aortic stenosis chronic respiratory failure is on 2 L of oxygen, comes into the hospital complaining of shortness of breath started the day prior to admission.  Chest x-ray showed left midlung interstitial thickening, BNP of 800   Assessment/Plan:   Acute on chronic respiratory failure with hypoxia: Of unclear etiology, unlikely heart failure.  Lasix was stopped. Started empirically on Rocephin and azithromycin, procalcitonin 0.48 Blood cultures are negative till date, respiratory virus panel is pending.  Acute kidney injury on CKD IIIa: Likely due to overdiuresis, Lasix.  On 07/06/2020 she was started on IV fluids today monitor strict I's and O's. We will give her a bolus along with her infusion.  Vitamin B12 deficiency: Will start on B12 142, will start B12 daily.  Chronic pancytopenia: Appears similar to previous.  Elevated troponins: She denies any chest pain, likely due to demand ischemia.  Essential hypertension: Continue verapamil.  Left shoulder pain and hip pain secondary to fall at home: Likely due to mechanical fall, consult PT OT.  Osteoarthritis: Continue symptomatic treatment.  DVT prophylaxis: lovenox Family Communication:none Status is: Inpatient  Remains inpatient appropriate because:Hemodynamically unstable   Dispo: The patient is from: SNF              Anticipated d/c is to: SNF              Patient currently is not medically stable to d/c.   Difficult to place patient No  Code Status:     Code Status Orders  (From admission, onward)         Start     Ordered   07/06/20 1209  Full code  Continuous        07/06/20 1208        Code Status History    Date Active Date Inactive Code  Status Order ID Comments User Context   02/03/2017 0400 02/08/2017 1738 Full Code [id number]  Ivor Costa, MD ED   01/12/2016 1631 01/13/2016 1256 Full Code [id number]  [name], MD Inpatient   Advance Care Planning Activity        IV Access:    Peripheral IV   Procedures and diagnostic studies:   ECHOCARDIOGRAM COMPLETE  Result Date: 07/06/2020    ECHOCARDIOGRAM REPORT   Patient Name:   Sophia [name] Date of Exam: 07/06/2020 Medical Rec #:  [id number]         Height:       63.0 in Accession #:    [id number]        Weight:       187.0 lb Date of Birth:  Apr 01, 1928         BSA:          1.879 m Patient Age:    56 years          BP:           134/78 mmHg Patient Gender: F                 HR:           84 bpm. Exam Location:  Inpatient Procedure: 2D Echo, Cardiac Doppler and Color Doppler Indications:    CHF  History:  Patient has no prior history of Echocardiogram examinations.                 CHF, Aortic Valve Disease; Risk Factors:Dyslipidemia and                 Hypertension.  Sonographer:    Cammy Brochure Referring Phys: [id number] Sophia [name]  Sonographer Comments: Image acquisition challenging due to patient body habitus. IMPRESSIONS  1. Left ventricular ejection fraction, by estimation, is 40 to 45%. The left ventricle has mildly decreased function. The left ventricle demonstrates global hypokinesis. There is moderate left ventricular hypertrophy. Left ventricular diastolic parameters are indeterminate.  2. Right ventricular systolic function is normal. The right ventricular size is normal. Tricuspid regurgitation signal is inadequate for assessing PA pressure.  3. Left atrial size was mildly dilated.  4. The mitral valve is normal in structure. Trivial mitral valve regurgitation. No evidence of mitral stenosis.  5. The aortic valve is abnormal. There is severe calcifcation of the aortic valve. Aortic valve regurgitation is not visualized. The aortic valve has severe calcification with  moderate aortic valve stenosis, however in the setting of low stroke volume index and DI of 0.20, this may represent low flow, low gradient severe aortic valve stenosis.. Aortic valve area, by VTI measures 0.62 cm. Aortic valve mean gradient measures 28.0 mmHg. Aortic valve Vmax measures 3.49 m/s. FINDINGS  Left Ventricle: Left ventricular ejection fraction, by estimation, is 40 to 45%. The left ventricle has mildly decreased function. The left ventricle demonstrates global hypokinesis. The left ventricular internal cavity size was normal in size. There is  moderate left ventricular hypertrophy. Abnormal (paradoxical) septal motion, consistent with left bundle branch block. Left ventricular diastolic parameters are indeterminate. Right Ventricle: The right ventricular size is normal. No increase in right ventricular wall thickness. Right ventricular systolic function is normal. Tricuspid regurgitation signal is inadequate for assessing PA pressure. Left Atrium: Left atrial size was mildly dilated. Right Atrium: Right atrial size was normal in size. Pericardium: There is no evidence of pericardial effusion. Mitral Valve: The mitral valve is normal in structure. Trivial mitral valve regurgitation. No evidence of mitral valve stenosis. Tricuspid Valve: The tricuspid valve is normal in structure. Tricuspid valve regurgitation is trivial. No evidence of tricuspid stenosis. Aortic Valve: The aortic valve is abnormal. There is severe calcifcation of the aortic valve. Aortic valve regurgitation is not visualized. The aortic valve has severe calcification with moderate aortic valve stenosis, however in the setting of low stroke volume index and DI of 0.20, this may represent low flow, low gradient severe aortic valve stenosis. Aortic valve mean gradient measures 28.0 mmHg. Aortic valve peak gradient measures 48.6 mmHg. Aortic valve area, by VTI measures 0.62 cm. Pulmonic Valve: The pulmonic valve was normal in structure.  Pulmonic valve regurgitation is trivial. No evidence of pulmonic stenosis. Aorta: The aortic root is normal in size and structure. Venous: The inferior vena cava was not well visualized. IAS/Shunts: No atrial level shunt detected by color flow Doppler.  LEFT VENTRICLE PLAX 2D LVIDd:         4.60 cm  Diastology LVIDs:         3.50 cm  LV e' medial:    6.09 cm/s LV PW:         1.30 cm  LV E/e' medial:  16.0 LV IVS:        1.50 cm  LV e' lateral:   5.55 cm/s LVOT diam:     2.00  cm  LV E/e' lateral: 17.6 LV SV:         43 LV SV Index:   23 LVOT Area:     3.14 cm  RIGHT VENTRICLE RV S prime:     12.70 cm/s LEFT ATRIUM             Index       RIGHT ATRIUM           Index LA diam:        3.90 cm 2.08 cm/m  RA Area:     13.10 cm LA Vol (A2C):   55.8 ml 29.69 ml/m RA Volume:   32.20 ml  17.14 ml/m LA Vol (A4C):   69.0 ml 36.72 ml/m LA Biplane Vol: 62.2 ml 33.10 ml/m  AORTIC VALVE AV Area (Vmax):    0.66 cm AV Area (Vmean):   0.76 cm AV Area (VTI):     0.62 cm AV Vmax:           348.70 cm/s AV Vmean:          204.000 cm/s AV VTI:            0.700 m AV Peak Grad:      48.6 mmHg AV Mean Grad:      28.0 mmHg LVOT Vmax:         73.70 cm/s LVOT Vmean:        49.300 cm/s LVOT VTI:          0.138 m LVOT/AV VTI ratio: 0.20  AORTA Ao Root diam: 3.50 cm Ao Asc diam:  3.00 cm MITRAL VALVE MV Area (PHT): 5.23 cm    SHUNTS MV Decel Time: 145 msec    Systemic VTI:  0.14 m MV E velocity: 97.70 cm/s  Systemic Diam: 2.00 cm Cherlynn Kaiser MD Electronically signed by Cherlynn Kaiser MD Signature Date/Time: 07/06/2020/10:38:41 PM    Final      Medical Consultants:    None.   Subjective:    Sophia [name] she relates she keeps having restless legs.  Objective:    Vitals:   07/07/20 1205 07/07/20 2012 07/08/20 0346 07/08/20 0809  BP: 96/76 (!) 96/51 138/90 122/69  Pulse: (!) 59 92 98 94  Resp: 16 18 18 20   Temp: 97.6 F (36.4 C) 98 F (36.7 C) (!) 97.5 F (36.4 C)   TempSrc:  Oral Oral   SpO2: 96% 90% 92%  92%  Weight:   80.7 kg    SpO2: 92 % O2 Flow Rate (L/min): 2 L/min   Intake/Output Summary (Last 24 hours) at 07/08/2020 0933 Last data filed at 07/08/2020 0000 Gross per 24 hour  Intake 1097.17 ml  Output 600 ml  Net 497.17 ml   Filed Weights   07/07/20 0542 07/08/20 0346  Weight: 81.7 kg 80.7 kg    Exam: General exam: In no acute distress. Respiratory system: Good air movement and clear to auscultation. Cardiovascular system: S1 & S2 heard, RRR. No JVD. Gastrointestinal system: Abdomen is nondistended, soft and nontender.  Extremities: No lower extremity edema Skin: No rashes, lesions or ulcers Psychiatry: Judgement and insight appear normal. Mood & affect appropriate.   Data Reviewed:    Labs: Basic Metabolic Panel: Recent Labs  Lab 07/06/20 0825 07/07/20 0405 07/08/20 0415  NA 136 134* 134*  K 4.0 3.6 3.6  CL 103 96* 94*  CO2 27 27 28   GLUCOSE 102* 105* 110*  BUN 17 21 35*  CREATININE 1.09* 1.29* 2.09*  CALCIUM  8.8* 8.7* 8.4*   GFR Estimated Creatinine Clearance: 18 mL/min (A) (by C-G formula based on SCr of 2.09 mg/dL (H)). Liver Function Tests: No results for input(s): AST, ALT, ALKPHOS, BILITOT, PROT, ALBUMIN in the last 168 hours. No results for input(s): LIPASE, AMYLASE in the last 168 hours. No results for input(s): AMMONIA in the last 168 hours. Coagulation profile No results for input(s): INR, PROTIME in the last 168 hours. COVID-19 Labs  No results for input(s): DDIMER, FERRITIN, LDH, CRP in the last 72 hours.  Lab Results  Component Value Date   [place] NEGATIVE 07/06/2020    CBC: Recent Labs  Lab 07/06/20 0825  WBC 3.4*  NEUTROABS 2.6  HGB 13.7  HCT 42.3  MCV 103.2*  PLT 147*   Cardiac Enzymes: No results for input(s): CKTOTAL, CKMB, CKMBINDEX, TROPONINI in the last 168 hours. BNP (last 3 results) No results for input(s): PROBNP in the last 8760 hours. CBG: No results for input(s): GLUCAP in the last 168  hours. D-Dimer: No results for input(s): DDIMER in the last 72 hours. Hgb A1c: No results for input(s): HGBA1C in the last 72 hours. Lipid Profile: No results for input(s): CHOL, HDL, LDLCALC, TRIG, CHOLHDL, LDLDIRECT in the last 72 hours. Thyroid function studies: No results for input(s): TSH, T4TOTAL, T3FREE, THYROIDAB in the last 72 hours.  Invalid input(s): FREET3 Anemia work up: Recent Labs    07/07/20 0405  VITAMINB12 142*   Sepsis Labs: Recent Labs  Lab 07/06/20 0825 07/07/20 0909 07/08/20 0415  PROCALCITON  --  0.48 0.43  WBC 3.4*  --   --    Microbiology Recent Results (from the past 240 hour(s))  Resp Panel by RT-PCR (Flu A&B, Covid) Nasopharyngeal Swab     Status: None   Collection Time: 07/06/20  8:51 AM   Specimen: Nasopharyngeal Swab; Nasopharyngeal(NP) swabs in vial transport medium  Result Value Ref Range Status   SARS Coronavirus 2 by RT PCR NEGATIVE NEGATIVE Final    Comment: (NOTE) SARS-CoV-2 target nucleic acids are NOT DETECTED.  The SARS-CoV-2 RNA is generally detectable in upper respiratory specimens during the acute phase of infection. The lowest concentration of SARS-CoV-2 viral copies this assay can detect is 138 copies/mL. A negative result does not preclude SARS-Cov-2 infection and should not be used as the sole basis for treatment or other patient management decisions. A negative result may occur with  improper specimen collection/handling, submission of specimen other than nasopharyngeal swab, presence of viral mutation(s) within the areas targeted by this assay, and inadequate number of viral copies(<138 copies/mL). A negative result must be combined with clinical observations, patient history, and epidemiological information. The expected result is Negative.  Fact Sheet for Patients:  EntrepreneurPulse.com.au  Fact Sheet for Healthcare Providers:  IncredibleEmployment.be  This test is no t yet  approved or cleared by the [place] FDA and  has been authorized for detection and/or diagnosis of SARS-CoV-2 by FDA under an Emergency Use Authorization (EUA). This EUA will remain  in effect (meaning this test can be used) for the duration of the COVID-19 declaration under Section 564(b)(1) of the Act, 21 U.S.C.section 360bbb-3(b)(1), unless the authorization is terminated  or revoked sooner.       Influenza A by PCR NEGATIVE NEGATIVE Final   Influenza B by PCR NEGATIVE NEGATIVE Final    Comment: (NOTE) The Xpert Xpress SARS-CoV-2/FLU/RSV plus assay is intended as an aid in the diagnosis of influenza from Nasopharyngeal swab specimens and should not be used as  a sole basis for treatment. Nasal washings and aspirates are unacceptable for Xpert Xpress SARS-CoV-2/FLU/RSV testing.  Fact Sheet for Patients: EntrepreneurPulse.com.au  Fact Sheet for Healthcare Providers: IncredibleEmployment.be  This test is not yet approved or cleared by the [place] FDA and has been authorized for detection and/or diagnosis of SARS-CoV-2 by FDA under an Emergency Use Authorization (EUA). This EUA will remain in effect (meaning this test can be used) for the duration of the COVID-19 declaration under Section 564(b)(1) of the Act, 21 U.S.C. section 360bbb-3(b)(1), unless the authorization is terminated or revoked.  Performed at [facility], [place] [address], [place], [place] [address]   Culture, blood (routine x 2) Call MD if unable to obtain prior to antibiotics being given     Status: None (Preliminary result)   Collection Time: 07/07/20  9:06 AM   Specimen: Left Antecubital; Blood  Result Value Ref Range Status   Specimen Description LEFT ANTECUBITAL  Final   Special Requests   Final    BOTTLES DRAWN AEROBIC AND ANAEROBIC Blood Culture results may not be optimal due to an inadequate volume of blood received in culture bottles   Culture   Final     NO GROWTH < 24 HOURS Performed at [facility] [address], [place], [place] [address]    Report Status PENDING  Incomplete  Culture, blood (routine x 2) Call MD if unable to obtain prior to antibiotics being given     Status: None (Preliminary result)   Collection Time: 07/07/20  9:09 AM   Specimen: BLOOD LEFT FOREARM  Result Value Ref Range Status   Specimen Description BLOOD LEFT FOREARM  Final   Special Requests   Final    BOTTLES DRAWN AEROBIC ONLY Blood Culture adequate volume   Culture   Final    NO GROWTH < 24 HOURS Performed at [facility], [place] [address] [place], [place] [address]    Report Status PENDING  Incomplete  Respiratory (~20 pathogens) panel by PCR     Status: None   Collection Time: 07/07/20 10:59 AM   Specimen: Nasopharyngeal Swab; Respiratory  Result Value Ref Range Status   Adenovirus NOT DETECTED NOT DETECTED Final   Coronavirus 229E NOT DETECTED NOT DETECTED Final    Comment: (NOTE) The Coronavirus on the Respiratory Panel, DOES NOT test for the novel  Coronavirus (2019 nCoV)    Coronavirus HKU1 NOT DETECTED NOT DETECTED Final   Coronavirus NL63 NOT DETECTED NOT DETECTED Final   Coronavirus OC43 NOT DETECTED NOT DETECTED Final   Metapneumovirus NOT DETECTED NOT DETECTED Final   Rhinovirus / Enterovirus NOT DETECTED NOT DETECTED Final   Influenza A NOT DETECTED NOT DETECTED Final   Influenza B NOT DETECTED NOT DETECTED Final   Parainfluenza Virus 1 NOT DETECTED NOT DETECTED Final   Parainfluenza Virus 2 NOT DETECTED NOT DETECTED Final   Parainfluenza Virus 3 NOT DETECTED NOT DETECTED Final   Parainfluenza Virus 4 NOT DETECTED NOT DETECTED Final   Respiratory Syncytial Virus NOT DETECTED NOT DETECTED Final   Bordetella pertussis NOT DETECTED NOT DETECTED Final   Bordetella Parapertussis NOT DETECTED NOT DETECTED Final   Chlamydophila pneumoniae NOT DETECTED NOT DETECTED Final   Mycoplasma pneumoniae NOT DETECTED NOT DETECTED Final     Comment: Performed at [facility], [place]. [address], [place], [place] [address]     Medications:   . cyanocobalamin  1,000 mcg Subcutaneous Daily  . enoxaparin (LOVENOX) injection  40  mg Subcutaneous Q24H  . lidocaine  1 patch Transdermal Q24H  . lidocaine  1 patch Transdermal Q24H  . pantoprazole  40 mg Oral Daily  . pramipexole  0.125 mg Oral QPM  . sertraline  100 mg Oral Daily  . sodium chloride flush  3 mL Intravenous Q12H  . verapamil  240 mg Oral Daily   Continuous Infusions: . sodium chloride    . azithromycin 500 mg (07/07/20 1542)  . cefTRIAXone (ROCEPHIN)  IV 2 g (07/07/20 1327)      LOS: 2 days   Charlynne Cousins  [facility]  07/08/2020, 9:33 AM

## 2020-07-08 NOTE — Progress Notes (Signed)
Quality infection called and notified RN that patient's respiratory panel came back negative and to remove droplet precaution. MD notified. Droplet precaution discontinued.

## 2020-07-08 NOTE — Progress Notes (Signed)
Daily Progress Note   Patient Name: Sophia [name]       Date: 07/08/2020 DOB: 04/05/1928  Age: 87 y.o. MRN#: [id number] Attending Physician: Charlynne Cousins, MD Primary Care Physician: [name], MD Admit Date: 07/06/2020  Reason for Consultation/Follow-up: Establishing goals of care  Subjective: Anxiety continues - RN has administered PRN xanax RLS symptoms continue - medication dose has been increased by Dr. Aileen Fass.  She shares her fears of having to go to facility - we discuss we have a plan to get her back home once her infection is treated.  Chaplain at bedside assisting with [facility] and living will. Son at bedside who shares concerns about her ongoing anxiety and fear of her being discharged too early. We discuss that there is no plan for discharge today.   Length of Stay: 2  Current Medications: Scheduled Meds:  . cyanocobalamin  1,000 mcg Subcutaneous Daily  . enoxaparin (LOVENOX) injection  30 mg Subcutaneous Q24H  . lidocaine  1 patch Transdermal Q24H  . lidocaine  1 patch Transdermal Q24H  . pantoprazole  40 mg Oral Daily  . rOPINIRole  0.25 mg Oral QHS  . sertraline  100 mg Oral Daily  . sodium chloride flush  3 mL Intravenous Q12H  . verapamil  240 mg Oral Daily    Continuous Infusions: . sodium chloride    . sodium chloride 75 mL/hr at 07/08/20 1019  . azithromycin 500 mg (07/07/20 1542)  . cefTRIAXone (ROCEPHIN)  IV 2 g (07/07/20 1327)    PRN Meds: sodium chloride, acetaminophen, ALPRAZolam, gabapentin, ondansetron (ZOFRAN) IV, sodium chloride flush  Physical Exam Constitutional:      General: She is not in acute distress. Pulmonary:     Effort: Pulmonary effort is normal.  Skin:    General: Skin is warm and dry.  Neurological:     Mental Status: She is  alert.  Psychiatric:        Mood and Affect: Mood is anxious.             Vital Signs: BP 122/69 (BP Location: Right Arm)   Pulse 94   Temp (!) 97.5 F (36.4 C) (Oral)   Resp 20   Wt 80.7 kg   SpO2 92%   BMI 31.52 kg/m  SpO2: SpO2: 92 % O2 Device: O2 Device: Nasal Cannula O2 Flow Rate: O2 Flow Rate (L/min): 2 L/min  Intake/output summary:   Intake/Output Summary (Last 24 hours) at 07/08/2020 1047 Last data filed at 07/08/2020 0000 Gross per 24 hour  Intake 1097.17 ml  Output 600 ml  Net 497.17 ml   LBM: Last BM Date: 07/06/20 Baseline Weight: Weight: 81.7 kg Most recent weight: Weight: 80.7 kg       Palliative Assessment/Data: PPS 50%    Flowsheet Rows   Flowsheet Row Most Recent Value  Intake Tab   Referral Department Hospitalist  Unit at Time of Referral Cardiac/Telemetry Unit  Palliative Care Primary Diagnosis Cardiac  Date Notified 07/06/20  Palliative Care Type New Palliative care  Reason for referral Clarify Goals of Care  Date of Admission 07/06/20  Date first seen by Palliative Care 07/07/20  # of days Palliative referral response time  1 Day(s)  # of days IP prior to Palliative referral 0  Clinical Assessment   Psychosocial & Spiritual Assessment   Palliative Care Outcomes       Patient Active Problem List   Diagnosis Date Noted  . Acute on chronic systolic CHF (congestive heart failure) ([place]) 07/06/2020  . Elevated troponin 07/06/2020  . Preventive measure 10/27/2019  . Vitamin D deficiency 12/02/2018  . Respiratory failure, chronic ([place]) 08/05/2017  . Anemia due to chronic blood loss   . GIB (gastrointestinal bleeding) 02/03/2017  . CKD (chronic kidney disease), stage III ([place]) 02/03/2017  . Chronic combined systolic (congestive) and diastolic (congestive) heart failure ([place]) 02/03/2017  . Orthostatic hypotension 01/12/2016  . Iron deficiency anemia 10/31/2015  . Cataract cortical, senile 07/22/2014  . Pancytopenia ([place])   . Chronic  leukopenia 09/07/2013  . Deficiency anemia 08/24/2013  . GAVE (gastric antral vascular ectasia) 04/28/2012  . Chronic gastritis 01/09/2011  . Aortic stenosis 01/09/2011  . Epigastric pain 07/29/2010  . Hyperlipidemia 08/05/2006  . Iron deficiency anemia due to chronic blood loss 08/05/2006  . ANXIETY DEPRESSION 08/05/2006  . NEUROPATHY, IDIOPATHIC PERIPHERAL NEC 08/05/2006  . Essential hypertension 08/05/2006  . GASTROESOPHAGEAL REFLUX DISEASE 08/05/2006  . Osteoarthritis 08/05/2006  . [place] DISEASE 08/05/2006  . OSTEOPOROSIS 08/05/2006  . SYMPTOM, INCONTINENCE, URINARY NOS 08/05/2006    Palliative Care Assessment & Plan   HPI: 87 y.o. female  with past medical history of EF of 40%, moderate aortic stenosis, chronic respiratory failure on 2 L of oxygen, OA, and anxiety admitted on 07/06/2020 with shortness of breath. Chest x-ray showed left midlung interstitial thickening. Labs revealed BNP of 800. Initially treated for HF exacerbation. Now being treated for infectious process. PMT consulted for [place].   Assessment: Ongoing RLS symptoms - med increased by attending. Ongoing anxiety - PRN xanax administered by RN.  Recommendations/Plan: - MOST completed as above, code status changed to DNR - TOC consult to arrange hospice services at home upon discharge, will need hospital bed -continue addressing symptom management needs - will follow  Code Status:  DNR  Discharge Planning:  Home with Hospice  Care plan was discussed with patient, son, RN  Thank you for allowing the Palliative Medicine Team to assist in the care of this patient.   Total Time 25 minutes Prolonged Time Billed  no       Greater than 50%  of this time was spent counseling and coordinating care related to the above assessment and plan.  Juel Burrow, DNP, [facility] Palliative Medicine Team Team Phone # [phone]  Pager [phone]

## 2020-07-09 DIAGNOSIS — Z515 Encounter for palliative care: Secondary | ICD-10-CM

## 2020-07-09 DIAGNOSIS — Z7189 Other specified counseling: Secondary | ICD-10-CM

## 2020-07-09 DIAGNOSIS — G2581 Restless legs syndrome: Secondary | ICD-10-CM

## 2020-07-09 DIAGNOSIS — Z66 Do not resuscitate: Secondary | ICD-10-CM

## 2020-07-09 LAB — BASIC METABOLIC PANEL
Anion gap: 10 (ref 5–15)
BUN: 27 mg/dL — ABNORMAL HIGH (ref 8–23)
CO2: 24 mmol/L (ref 22–32)
Calcium: 7.6 mg/dL — ABNORMAL LOW (ref 8.9–10.3)
Chloride: 103 mmol/L (ref 98–111)
Creatinine, Ser: 1.31 mg/dL — ABNORMAL HIGH (ref 0.44–1.00)
GFR, Estimated: 39 mL/min — ABNORMAL LOW (ref >60)
Glucose, Bld: 98 mg/dL (ref 70–99)
Potassium: 3.6 mmol/L (ref 3.5–5.1)
Sodium: 137 mmol/L (ref 135–145)

## 2020-07-09 LAB — PROCALCITONIN: Procalcitonin: 0.14 ng/mL

## 2020-07-09 MED ORDER — AZITHROMYCIN 250 MG PO TABS: 500.0000 mg | ORAL_TABLET | Freq: Every day | ORAL | Status: DC

## 2020-07-09 MED ORDER — AMOXICILLIN-POT CLAVULANATE 875-125 MG PO TABS
1.0000 | ORAL_TABLET | Freq: Two times a day (BID) | ORAL | Status: DC
  Administered 2020-07-09 (×2): 1 via ORAL
  Filled 2020-07-09 (×2): qty 1

## 2020-07-09 NOTE — Progress Notes (Signed)
[facility] PROGRESS NOTE    Progress Note  DAVEDA LAROCK  PFX:[id number] DOB: 05-22-1928 DOA: 07/06/2020 PCP: [name], MD     Brief Narrative:   Sophia [name] is an 87 y.o. female past medical history significant for EF of 40% moderate aortic stenosis chronic respiratory failure is on 2 L of oxygen, comes into the hospital complaining of shortness of breath started the day prior to admission.  Chest x-ray showed left midlung interstitial thickening, BNP of 800   Assessment/Plan:   Acute on chronic respiratory failure with hypoxia likely due to community-acquired pneumonia: Started empirically on Rocephin and azithromycin, procalcitonin 0.48 Blood cultures are negative till date, respiratory virus panel is pending. Physical therapy evaluated the patient and recommended home health PT. Acute kidney injury on CKD IIIa: Likely due to overdiuresis, Lasix.  On 07/06/2020 she was started on IV fluids today monitor strict I's and O's. We will give her a bolus along with her infusion.  Vitamin B12 deficiency: Will start on B12 142, will start B12 daily.  Chronic pancytopenia: Appears similar to previous.  Elevated troponins: She denies any chest pain, likely due to demand ischemia.  Essential hypertension: Continue verapamil.  Left shoulder pain and hip pain secondary to fall at home: Likely due to mechanical fall, consult PT OT.  Osteoarthritis: Continue symptomatic treatment.  DVT prophylaxis: lovenox Family Communication:none Status is: Inpatient  Remains inpatient appropriate because:Hemodynamically unstable   Dispo: The patient is from: SNF              Anticipated d/c is to: SNF              Patient currently is not medically stable to d/c.   Difficult to place patient No  Code Status:     Code Status Orders  (From admission, onward)         Start     Ordered   07/06/20 1209  Full code  Continuous        07/06/20 1208        Code  Status History    Date Active Date Inactive Code Status Order ID Comments User Context   02/03/2017 0400 02/08/2017 1738 Full Code [id number]  Ivor Costa, MD ED   01/12/2016 1631 01/13/2016 1256 Full Code [id number]  [name], MD Inpatient   Advance Care Planning Activity        IV Access:    Peripheral IV   Procedures and diagnostic studies:   No results found.   Medical Consultants:    None.   Subjective:    TAQWA DEEM her restless leg is improved.  Objective:    Vitals:   07/08/20 0809 07/08/20 1131 07/08/20 1940 07/09/20 0417  BP: 122/69 (!) 103/53 (!) 152/79 122/77  Pulse: 94 91 92 81  Resp: 20 17 18 18   Temp:  98 F (36.7 C) 97.9 F (36.6 C) 97.8 F (36.6 C)  TempSrc:   Oral Oral  SpO2: 92% 96% 96% 100%  Weight:    85.2 kg   SpO2: 100 % O2 Flow Rate (L/min): 2 L/min   Intake/Output Summary (Last 24 hours) at 07/09/2020 0926 Last data filed at 07/08/2020 2115 Gross per 24 hour  Intake 793.54 ml  Output 550 ml  Net 243.54 ml   Filed Weights   07/07/20 0542 07/08/20 0346 07/09/20 0417  Weight: 81.7 kg 80.7 kg 85.2 kg    Exam: General exam: In no acute distress. Respiratory system: Good air movement and  clear to auscultation. Cardiovascular system: S1 & S2 heard, RRR. No JVD. Gastrointestinal system: Abdomen is nondistended, soft and nontender.  Extremities: No pedal edema. Skin: No rashes, lesions or ulcers Psychiatry: Judgement and insight appear normal. Mood & affect appropriate.   Data Reviewed:    Labs: Basic Metabolic Panel: Recent Labs  Lab 07/06/20 0825 07/07/20 0405 07/08/20 0415 07/09/20 0141  NA 136 134* 134* 137  K 4.0 3.6 3.6 3.6  CL 103 96* 94* 103  CO2 27 27 28 24   GLUCOSE 102* 105* 110* 98  BUN 17 21 35* 27*  CREATININE 1.09* 1.29* 2.09* 1.31*  CALCIUM 8.8* 8.7* 8.4* 7.6*   GFR Estimated Creatinine Clearance: 29.5 mL/min (A) (by C-G formula based on SCr of 1.31 mg/dL (H)). Liver Function Tests: No  results for input(s): AST, ALT, ALKPHOS, BILITOT, PROT, ALBUMIN in the last 168 hours. No results for input(s): LIPASE, AMYLASE in the last 168 hours. No results for input(s): AMMONIA in the last 168 hours. Coagulation profile No results for input(s): INR, PROTIME in the last 168 hours. COVID-19 Labs  No results for input(s): DDIMER, FERRITIN, LDH, CRP in the last 72 hours.  Lab Results  Component Value Date   [place] NEGATIVE 07/06/2020    CBC: Recent Labs  Lab 07/06/20 0825  WBC 3.4*  NEUTROABS 2.6  HGB 13.7  HCT 42.3  MCV 103.2*  PLT 147*   Cardiac Enzymes: No results for input(s): CKTOTAL, CKMB, CKMBINDEX, TROPONINI in the last 168 hours. BNP (last 3 results) No results for input(s): PROBNP in the last 8760 hours. CBG: No results for input(s): GLUCAP in the last 168 hours. D-Dimer: No results for input(s): DDIMER in the last 72 hours. Hgb A1c: No results for input(s): HGBA1C in the last 72 hours. Lipid Profile: No results for input(s): CHOL, HDL, LDLCALC, TRIG, CHOLHDL, LDLDIRECT in the last 72 hours. Thyroid function studies: No results for input(s): TSH, T4TOTAL, T3FREE, THYROIDAB in the last 72 hours.  Invalid input(s): FREET3 Anemia work up: Recent Labs    07/07/20 0405  VITAMINB12 142*   Sepsis Labs: Recent Labs  Lab 07/06/20 0825 07/07/20 0909 07/08/20 0415 07/09/20 0141  PROCALCITON  --  0.48 0.43 0.14  WBC 3.4*  --   --   --    Microbiology Recent Results (from the past 240 hour(s))  Resp Panel by RT-PCR (Flu A&B, Covid) Nasopharyngeal Swab     Status: None   Collection Time: 07/06/20  8:51 AM   Specimen: Nasopharyngeal Swab; Nasopharyngeal(NP) swabs in vial transport medium  Result Value Ref Range Status   SARS Coronavirus 2 by RT PCR NEGATIVE NEGATIVE Final    Comment: (NOTE) SARS-CoV-2 target nucleic acids are NOT DETECTED.  The SARS-CoV-2 RNA is generally detectable in upper respiratory specimens during the acute phase of  infection. The lowest concentration of SARS-CoV-2 viral copies this assay can detect is 138 copies/mL. A negative result does not preclude SARS-Cov-2 infection and should not be used as the sole basis for treatment or other patient management decisions. A negative result may occur with  improper specimen collection/handling, submission of specimen other than nasopharyngeal swab, presence of viral mutation(s) within the areas targeted by this assay, and inadequate number of viral copies(<138 copies/mL). A negative result must be combined with clinical observations, patient history, and epidemiological information. The expected result is Negative.  Fact Sheet for Patients:  EntrepreneurPulse.com.au  Fact Sheet for Healthcare Providers:  IncredibleEmployment.be  This test is no t yet approved or cleared  by the [place] and  has been authorized for detection and/or diagnosis of SARS-CoV-2 by FDA under an Emergency Use Authorization (EUA). This EUA will remain  in effect (meaning this test can be used) for the duration of the COVID-19 declaration under Section 564(b)(1) of the Act, 21 U.S.C.section 360bbb-3(b)(1), unless the authorization is terminated  or revoked sooner.       Influenza A by PCR NEGATIVE NEGATIVE Final   Influenza B by PCR NEGATIVE NEGATIVE Final    Comment: (NOTE) The Xpert Xpress SARS-CoV-2/FLU/RSV plus assay is intended as an aid in the diagnosis of influenza from Nasopharyngeal swab specimens and should not be used as a sole basis for treatment. Nasal washings and aspirates are unacceptable for Xpert Xpress SARS-CoV-2/FLU/RSV testing.  Fact Sheet for Patients: EntrepreneurPulse.com.au  Fact Sheet for Healthcare Providers: IncredibleEmployment.be  This test is not yet approved or cleared by the [place] FDA and has been authorized for detection and/or diagnosis of SARS-CoV-2  by FDA under an Emergency Use Authorization (EUA). This EUA will remain in effect (meaning this test can be used) for the duration of the COVID-19 declaration under Section 564(b)(1) of the Act, 21 U.S.C. section 360bbb-3(b)(1), unless the authorization is terminated or revoked.  Performed at [facility], [place] [address] [place], [place] [address]   Culture, blood (routine x 2) Call MD if unable to obtain prior to antibiotics being given     Status: None (Preliminary result)   Collection Time: 07/07/20  9:06 AM   Specimen: Left Antecubital; Blood  Result Value Ref Range Status   Specimen Description LEFT ANTECUBITAL  Final   Special Requests   Final    BOTTLES DRAWN AEROBIC AND ANAEROBIC Blood Culture results may not be optimal due to an inadequate volume of blood received in culture bottles   Culture   Final    NO GROWTH < 24 HOURS Performed at [place] [address] [place], [place] [address]    Report Status PENDING  Incomplete  Culture, blood (routine x 2) Call MD if unable to obtain prior to antibiotics being given     Status: None (Preliminary result)   Collection Time: 07/07/20  9:09 AM   Specimen: BLOOD LEFT FOREARM  Result Value Ref Range Status   Specimen Description BLOOD LEFT FOREARM  Final   Special Requests   Final    BOTTLES DRAWN AEROBIC ONLY Blood Culture adequate volume   Culture   Final    NO GROWTH < 24 HOURS Performed at [facility], [place] [address], [place], [place] [address]    Report Status PENDING  Incomplete  Respiratory (~20 pathogens) panel by PCR     Status: None   Collection Time: 07/07/20 10:59 AM   Specimen: Nasopharyngeal Swab; Respiratory  Result Value Ref Range Status   Adenovirus NOT DETECTED NOT DETECTED Final   Coronavirus 229E NOT DETECTED NOT DETECTED Final    Comment: (NOTE) The Coronavirus on the Respiratory Panel, DOES NOT test for the novel  Coronavirus (2019 nCoV)    Coronavirus HKU1 NOT DETECTED NOT  DETECTED Final   Coronavirus NL63 NOT DETECTED NOT DETECTED Final   Coronavirus OC43 NOT DETECTED NOT DETECTED Final   Metapneumovirus NOT DETECTED NOT DETECTED Final   Rhinovirus / Enterovirus NOT DETECTED NOT DETECTED Final   Influenza A NOT DETECTED NOT DETECTED Final   Influenza B NOT DETECTED NOT DETECTED Final   Parainfluenza Virus 1 NOT DETECTED NOT DETECTED  Final   Parainfluenza Virus 2 NOT DETECTED NOT DETECTED Final   Parainfluenza Virus 3 NOT DETECTED NOT DETECTED Final   Parainfluenza Virus 4 NOT DETECTED NOT DETECTED Final   Respiratory Syncytial Virus NOT DETECTED NOT DETECTED Final   Bordetella pertussis NOT DETECTED NOT DETECTED Final   Bordetella Parapertussis NOT DETECTED NOT DETECTED Final   Chlamydophila pneumoniae NOT DETECTED NOT DETECTED Final   Mycoplasma pneumoniae NOT DETECTED NOT DETECTED Final    Comment: Performed at [facility], [place] [address] [place], [place] [address]     Medications:   . cyanocobalamin  1,000 mcg Subcutaneous Daily  . enoxaparin (LOVENOX) injection  30 mg Subcutaneous Q24H  . lidocaine  1 patch Transdermal Q24H  . lidocaine  1 patch Transdermal Q24H  . pantoprazole  40 mg Oral Daily  . rOPINIRole  0.25 mg Oral QPM  . sertraline  100 mg Oral Daily  . sodium chloride flush  3 mL Intravenous Q12H  . verapamil  240 mg Oral Daily   Continuous Infusions: . sodium chloride    . sodium chloride 75 mL/hr at 07/08/20 2058  . azithromycin 500 mg (07/09/20 0846)  . cefTRIAXone (ROCEPHIN)  IV 2 g (07/09/20 0811)      LOS: 3 days   [facility]  07/09/2020, 9:26 AM

## 2020-07-09 NOTE — TOC Initial Note (Addendum)
Transition of Care [facility]) - Initial/Assessment Note    Patient Details  Name: Sophia [name] MRN: [id number] Date of Birth: 28-Jan-1929  [facility] CM/SW Contact:    Carles Collet, RN Phone Number: 07/09/2020, 1:54 PM  Clinical Narrative:        Spoke with son in person. He states that 3 years ago his mother moved in with him and he has been her caregiver since. He states that she was more functional prior to admission, and he was able to assist her in her ADLs.  He is concerned now because he is told that she is requiring 2 person assist to get to bedside commode, hasn't walked, and is "shaky." He is concerned about needing to provide 24/7 supervision at home and physical support necessary for bathing, turning, toileting, even with hospital bed, hoyer etc. He states that he does not have financial capacity to provide private duty home aid, and there is not any other family to assist. We discussed treatment trajectory- curative vs comfort and implications that would have on disposition as well as her physical capability to participate in therapies at home or in SNF. Patient is limited by weakness and anxiety, current PT recs are for SNF placement if patient/ family agreeable.    Son would primarily be interested in patient returning home, as he prides himself in the care he has been able to provide to his mother thus far, however he is unsure that he would be able to safely support her based on how she is doing today. He would like to consider residential hospice if she is eligible, or SNF with hospice or palliative.  I have reached out to [facility] NP palliative care team to assist with determination of residential hospice eligibility.  Update- patient not residential hospice eligible             Expected Discharge Plan:  (TBD) Barriers to Discharge: Continued Medical Work up   Patient Goals and CMS Choice        Expected Discharge Plan and Services Expected Discharge Plan:   (TBD)   Discharge Planning Services: CM Consult                                          Prior Living Arrangements/Services   Lives with:: Adult Children                   Activities of Daily Living Home Assistive Devices/Equipment: Environmental consultant (specify type) ADL Screening (condition at time of admission) Is the patient deaf or have difficulty hearing?: No Does the patient have difficulty seeing, even when wearing glasses/contacts?: No Does the patient have difficulty concentrating, remembering, or making decisions?: No Patient able to express need for assistance with ADLs?: Yes Does the patient have difficulty dressing or bathing?: Yes Independently performs ADLs?: Yes (appropriate for developmental age) Does the patient have difficulty walking or climbing stairs?: Yes Weakness of Legs: Both Weakness of Arms/Hands: Both  Permission Sought/Granted                  Emotional Assessment              Admission diagnosis:  Shortness of breath [R06.02] CHF (congestive heart failure) ([place]) [I50.9] Heart murmur [R01.1] Heart failure, unspecified HF chronicity, unspecified heart failure type ([place]) [I50.9] Patient Active Problem List   Diagnosis Date Noted  .  Acute on chronic systolic CHF (congestive heart failure) ([place]) 07/06/2020  . Elevated troponin 07/06/2020  . Preventive measure 10/27/2019  . Vitamin D deficiency 12/02/2018  . Respiratory failure, chronic ([place]) 08/05/2017  . Anemia due to chronic blood loss   . GIB (gastrointestinal bleeding) 02/03/2017  . CKD (chronic kidney disease), stage III ([place]) 02/03/2017  . Chronic combined systolic (congestive) and diastolic (congestive) heart failure ([place]) 02/03/2017  . Orthostatic hypotension 01/12/2016  . Iron deficiency anemia 10/31/2015  . Cataract cortical, senile 07/22/2014  . Pancytopenia ([place])   . Chronic leukopenia 09/07/2013  . Deficiency anemia 08/24/2013  . GAVE (gastric antral vascular  ectasia) 04/28/2012  . Chronic gastritis 01/09/2011  . Aortic stenosis 01/09/2011  . Epigastric pain 07/29/2010  . Hyperlipidemia 08/05/2006  . Iron deficiency anemia due to chronic blood loss 08/05/2006  . ANXIETY DEPRESSION 08/05/2006  . NEUROPATHY, IDIOPATHIC PERIPHERAL NEC 08/05/2006  . Essential hypertension 08/05/2006  . GASTROESOPHAGEAL REFLUX DISEASE 08/05/2006  . Osteoarthritis 08/05/2006  . [place] DISEASE 08/05/2006  . OSTEOPOROSIS 08/05/2006  . SYMPTOM, INCONTINENCE, URINARY NOS 08/05/2006   PCP:  [name], MD Pharmacy:   [id number] - [place], [place] [address] [place] [address] Phone: [phone] Fax: [phone]     Social Determinants of Health (SDOH) Interventions    Readmission Risk Interventions No flowsheet data found.

## 2020-07-09 NOTE — Progress Notes (Signed)
Pt complained of Pain on R knee even with slight movement, swelling noted. MD paged made aware.

## 2020-07-10 ENCOUNTER — Inpatient Hospital Stay (HOSPITAL_COMMUNITY): Payer: Medicare Other

## 2020-07-10 LAB — BASIC METABOLIC PANEL
Anion gap: 7 (ref 5–15)
BUN: 24 mg/dL — ABNORMAL HIGH (ref 8–23)
CO2: 24 mmol/L (ref 22–32)
Calcium: 7.5 mg/dL — ABNORMAL LOW (ref 8.9–10.3)
Chloride: 104 mmol/L (ref 98–111)
Creatinine, Ser: 1.13 mg/dL — ABNORMAL HIGH (ref 0.44–1.00)
GFR, Estimated: 46 mL/min — ABNORMAL LOW (ref >60)
Glucose, Bld: 111 mg/dL — ABNORMAL HIGH (ref 70–99)
Potassium: 3.8 mmol/L (ref 3.5–5.1)
Sodium: 135 mmol/L (ref 135–145)

## 2020-07-10 MED ORDER — AZITHROMYCIN 250 MG PO TABS
500.0000 mg | ORAL_TABLET | Freq: Every day | ORAL | Status: AC
  Administered 2020-07-10 – 2020-07-11 (×2): 500 mg via ORAL
  Filled 2020-07-10 (×2): qty 2

## 2020-07-10 MED ORDER — CYANOCOBALAMIN 1000 MCG/ML IJ SOLN
1000.0000 ug | Freq: Every day | INTRAMUSCULAR | Status: AC
  Administered 2020-07-10 – 2020-07-13 (×4): 1000 ug via SUBCUTANEOUS
  Filled 2020-07-10 (×4): qty 1

## 2020-07-10 MED ORDER — AMOXICILLIN-POT CLAVULANATE 875-125 MG PO TABS
1.0000 | ORAL_TABLET | Freq: Two times a day (BID) | ORAL | Status: AC
  Administered 2020-07-10 – 2020-07-11 (×4): 1 via ORAL
  Filled 2020-07-10 (×4): qty 1

## 2020-07-10 NOTE — Progress Notes (Signed)
[facility] PROGRESS NOTE    Progress Note  Sophia [name]  EXH:[id number] DOB: 1928-06-26 DOA: 07/06/2020 PCP: [name], MD     Brief Narrative:   Sophia [name] is an 87 y.o. female past medical history significant for EF of 40% moderate aortic stenosis chronic respiratory failure is on 2 L of oxygen, comes into the hospital complaining of shortness of breath started the day prior to admission.  Chest x-ray showed left midlung interstitial thickening, BNP of 800   Assessment/Plan:   Acute on chronic respiratory failure with hypoxia likely due to community-acquired pneumonia: Transition to Augmentin and azithromycin. Blood cultures are negative till date, respiratory virus panel is negative Physical therapy evaluated the patient and recommended home health PT.  Acute kidney injury on CKD IIIa: Likely due to overdiuresis, resolved with IV fluid hydration her creatinine has returned to baseline 1.1.  Right knee pain: She relates she feels about 2 weeks ago but now her knee is bothering. She says the lidocaine patch is working. We will get a knee x-ray.  Vitamin B12 deficiency: Will start on B12 142, will start B12 daily.  Chronic pancytopenia: Appears similar to previous.  Elevated troponins: She denies any chest pain, likely due to demand ischemia.  Essential hypertension: Continue verapamil.  Left shoulder pain and hip pain secondary to fall at home: Likely due to mechanical fall, consult PT OT.  Osteoarthritis: Continue symptomatic treatment.  DVT prophylaxis: lovenox Family Communication:none Status is: Inpatient  Remains inpatient appropriate because:Hemodynamically unstable   Dispo: The patient is from: SNF              Anticipated d/c is to: SNF              Patient currently is not medically stable to d/c.   Difficult to place patient No  Code Status:     Code Status Orders  (From admission, onward)         Start     Ordered    07/06/20 1209  Full code  Continuous        07/06/20 1208        Code Status History    Date Active Date Inactive Code Status Order ID Comments User Context   02/03/2017 0400 02/08/2017 1738 Full Code [id number]  Ivor Costa, MD ED   01/12/2016 1631 01/13/2016 1256 Full Code [id number]  [name], MD Inpatient   Advance Care Planning Activity        IV Access:    Peripheral IV   Procedures and diagnostic studies:   No results found.   Medical Consultants:    None.   Subjective:    Sophia [name] complaining of right knee pain.  Objective:    Vitals:   07/09/20 1934 07/10/20 0004 07/10/20 0500 07/10/20 0504  BP: 116/90   136/61  Pulse: 84   84  Resp: 20   19  Temp: 99.9 F (37.7 C)   98.4 F (36.9 C)  TempSrc: Oral   Oral  SpO2: 96%   98%  Weight:  84.7 kg 85.5 kg    SpO2: 98 % O2 Flow Rate (L/min): 2 L/min   Intake/Output Summary (Last 24 hours) at 07/10/2020 0842 Last data filed at 07/09/2020 2300 Gross per 24 hour  Intake 240 ml  Output 600 ml  Net -360 ml   Filed Weights   07/09/20 0417 07/10/20 0004 07/10/20 0500  Weight: 85.2 kg 84.7 kg 85.5 kg    Exam: General  exam: In no acute distress. Respiratory system: Good air movement and clear to auscultation. Cardiovascular system: S1 & S2 heard, RRR. No JVD. Gastrointestinal system: Abdomen is nondistended, soft and nontender.  Extremities: No pedal edema. Skin: No rashes, lesions or ulcers Psychiatry: Judgement and insight appear normal. Mood & affect appropriate.   Data Reviewed:    Labs: Basic Metabolic Panel: Recent Labs  Lab 07/06/20 0825 07/07/20 0405 07/08/20 0415 07/09/20 0141 07/10/20 0419  NA 136 134* 134* 137 135  K 4.0 3.6 3.6 3.6 3.8  CL 103 96* 94* 103 104  CO2 27 27 28 24 24   GLUCOSE 102* 105* 110* 98 111*  BUN 17 21 35* 27* 24*  CREATININE 1.09* 1.29* 2.09* 1.31* 1.13*  CALCIUM 8.8* 8.7* 8.4* 7.6* 7.5*   GFR Estimated Creatinine Clearance: 34.3 mL/min (A) (by  C-G formula based on SCr of 1.13 mg/dL (H)). Liver Function Tests: No results for input(s): AST, ALT, ALKPHOS, BILITOT, PROT, ALBUMIN in the last 168 hours. No results for input(s): LIPASE, AMYLASE in the last 168 hours. No results for input(s): AMMONIA in the last 168 hours. Coagulation profile No results for input(s): INR, PROTIME in the last 168 hours. COVID-19 Labs  No results for input(s): DDIMER, FERRITIN, LDH, CRP in the last 72 hours.  Lab Results  Component Value Date   [place] NEGATIVE 07/06/2020    CBC: Recent Labs  Lab 07/06/20 0825  WBC 3.4*  NEUTROABS 2.6  HGB 13.7  HCT 42.3  MCV 103.2*  PLT 147*   Cardiac Enzymes: No results for input(s): CKTOTAL, CKMB, CKMBINDEX, TROPONINI in the last 168 hours. BNP (last 3 results) No results for input(s): PROBNP in the last 8760 hours. CBG: No results for input(s): GLUCAP in the last 168 hours. D-Dimer: No results for input(s): DDIMER in the last 72 hours. Hgb A1c: No results for input(s): HGBA1C in the last 72 hours. Lipid Profile: No results for input(s): CHOL, HDL, LDLCALC, TRIG, CHOLHDL, LDLDIRECT in the last 72 hours. Thyroid function studies: No results for input(s): TSH, T4TOTAL, T3FREE, THYROIDAB in the last 72 hours.  Invalid input(s): FREET3 Anemia work up: No results for input(s): VITAMINB12, FOLATE, FERRITIN, TIBC, IRON, RETICCTPCT in the last 72 hours. Sepsis Labs: Recent Labs  Lab 07/06/20 0825 07/07/20 0909 07/08/20 0415 07/09/20 0141  PROCALCITON  --  0.48 0.43 0.14  WBC 3.4*  --   --   --    Microbiology Recent Results (from the past 240 hour(s))  Resp Panel by RT-PCR (Flu A&B, Covid) Nasopharyngeal Swab     Status: None   Collection Time: 07/06/20  8:51 AM   Specimen: Nasopharyngeal Swab; Nasopharyngeal(NP) swabs in vial transport medium  Result Value Ref Range Status   SARS Coronavirus 2 by RT PCR NEGATIVE NEGATIVE Final    Comment: (NOTE) SARS-CoV-2 target nucleic acids are NOT  DETECTED.  The SARS-CoV-2 RNA is generally detectable in upper respiratory specimens during the acute phase of infection. The lowest concentration of SARS-CoV-2 viral copies this assay can detect is 138 copies/mL. A negative result does not preclude SARS-Cov-2 infection and should not be used as the sole basis for treatment or other patient management decisions. A negative result may occur with  improper specimen collection/handling, submission of specimen other than nasopharyngeal swab, presence of viral mutation(s) within the areas targeted by this assay, and inadequate number of viral copies(<138 copies/mL). A negative result must be combined with clinical observations, patient history, and epidemiological information. The expected result is Negative.  Fact Sheet for Patients:  EntrepreneurPulse.com.au  Fact Sheet for Healthcare Providers:  IncredibleEmployment.be  This test is no t yet approved or cleared by the [place] FDA and  has been authorized for detection and/or diagnosis of SARS-CoV-2 by FDA under an Emergency Use Authorization (EUA). This EUA will remain  in effect (meaning this test can be used) for the duration of the COVID-19 declaration under Section 564(b)(1) of the Act, 21 U.S.C.section 360bbb-3(b)(1), unless the authorization is terminated  or revoked sooner.       Influenza A by PCR NEGATIVE NEGATIVE Final   Influenza B by PCR NEGATIVE NEGATIVE Final    Comment: (NOTE) The Xpert Xpress SARS-CoV-2/FLU/RSV plus assay is intended as an aid in the diagnosis of influenza from Nasopharyngeal swab specimens and should not be used as a sole basis for treatment. Nasal washings and aspirates are unacceptable for Xpert Xpress SARS-CoV-2/FLU/RSV testing.  Fact Sheet for Patients: EntrepreneurPulse.com.au  Fact Sheet for Healthcare Providers: IncredibleEmployment.be  This test is not yet  approved or cleared by the [place] FDA and has been authorized for detection and/or diagnosis of SARS-CoV-2 by FDA under an Emergency Use Authorization (EUA). This EUA will remain in effect (meaning this test can be used) for the duration of the COVID-19 declaration under Section 564(b)(1) of the Act, 21 U.S.C. section 360bbb-3(b)(1), unless the authorization is terminated or revoked.  Performed at [facility], [place] [address], [place], [place] [address]   Culture, blood (routine x 2) Call MD if unable to obtain prior to antibiotics being given     Status: None (Preliminary result)   Collection Time: 07/07/20  9:06 AM   Specimen: Left Antecubital; Blood  Result Value Ref Range Status   Specimen Description LEFT ANTECUBITAL  Final   Special Requests   Final    BOTTLES DRAWN AEROBIC AND ANAEROBIC Blood Culture results may not be optimal due to an inadequate volume of blood received in culture bottles   Culture   Final    NO GROWTH 2 DAYS Performed at [place] [address], [place], [place] [address]    Report Status PENDING  Incomplete  Culture, blood (routine x 2) Call MD if unable to obtain prior to antibiotics being given     Status: None (Preliminary result)   Collection Time: 07/07/20  9:09 AM   Specimen: BLOOD LEFT FOREARM  Result Value Ref Range Status   Specimen Description BLOOD LEFT FOREARM  Final   Special Requests   Final    BOTTLES DRAWN AEROBIC ONLY Blood Culture adequate volume   Culture   Final    NO GROWTH 2 DAYS Performed at [facility], [address], [place], [place] [address]    Report Status PENDING  Incomplete  Respiratory (~20 pathogens) panel by PCR     Status: None   Collection Time: 07/07/20 10:59 AM   Specimen: Nasopharyngeal Swab; Respiratory  Result Value Ref Range Status   Adenovirus NOT DETECTED NOT DETECTED Final   Coronavirus 229E NOT DETECTED NOT DETECTED Final    Comment: (NOTE) The Coronavirus on the  Respiratory Panel, DOES NOT test for the novel  Coronavirus (2019 nCoV)    Coronavirus HKU1 NOT DETECTED NOT DETECTED Final   Coronavirus NL63 NOT DETECTED NOT DETECTED Final   Coronavirus OC43 NOT DETECTED NOT DETECTED Final   Metapneumovirus NOT DETECTED NOT DETECTED Final   Rhinovirus / Enterovirus NOT DETECTED NOT DETECTED Final   Influenza A NOT  DETECTED NOT DETECTED Final   Influenza B NOT DETECTED NOT DETECTED Final   Parainfluenza Virus 1 NOT DETECTED NOT DETECTED Final   Parainfluenza Virus 2 NOT DETECTED NOT DETECTED Final   Parainfluenza Virus 3 NOT DETECTED NOT DETECTED Final   Parainfluenza Virus 4 NOT DETECTED NOT DETECTED Final   Respiratory Syncytial Virus NOT DETECTED NOT DETECTED Final   Bordetella pertussis NOT DETECTED NOT DETECTED Final   Bordetella Parapertussis NOT DETECTED NOT DETECTED Final   Chlamydophila pneumoniae NOT DETECTED NOT DETECTED Final   Mycoplasma pneumoniae NOT DETECTED NOT DETECTED Final    Comment: Performed at [facility], [place] [address] [place], [place] [address]     Medications:   . amoxicillin-clavulanate  1 tablet Oral Q12H  . azithromycin  500 mg Oral Daily  . cyanocobalamin  1,000 mcg Subcutaneous Daily  . enoxaparin (LOVENOX) injection  30 mg Subcutaneous Q24H  . lidocaine  1 patch Transdermal Q24H  . lidocaine  1 patch Transdermal Q24H  . pantoprazole  40 mg Oral Daily  . rOPINIRole  0.25 mg Oral QPM  . sertraline  100 mg Oral Daily  . sodium chloride flush  3 mL Intravenous Q12H  . verapamil  240 mg Oral Daily   Continuous Infusions: . sodium chloride        LOS: 4 days   Charlynne Cousins  [facility]  07/10/2020, 8:42 AM

## 2020-07-10 NOTE — TOC Progression Note (Signed)
Transition of Care [facility]) - Progression Note    Patient Details  Name: Sophia [name] MRN: [id number] Date of Birth: 05-Mar-1928  Transition of Care [facility]) CM/SW [place], [place] Phone Number: [phone] 07/10/2020, 10:27 AM  Clinical Narrative:     CSW spoke with pt's son [name] in regards to a SNF placement. [name] explained that pt's leg is swollen really bad and he is not ready to make a decision of rather to move forward with a SNF. [name] explained that he has a meeting with Palliative NP Shae tomorrow. He said that he wants to explore all of his options. CSW let him know if he decided to move forward with a SNF to have NP to reach out to weekday CSW.  TOC team will continue to assist with discharge planning needs.   Expected Discharge Plan:  (TBD) Barriers to Discharge: Continued Medical Work up  Expected Discharge Plan and Services Expected Discharge Plan:  (TBD)   Discharge Planning Services: CM Consult                                           Social Determinants of Health (SDOH) Interventions    Readmission Risk Interventions No flowsheet data found.

## 2020-07-11 LAB — SYNOVIAL CELL COUNT + DIFF, W/ CRYSTALS
Crystals, Fluid: NONE SEEN
Eosinophils-Synovial: 0 % (ref 0–1)
Lymphocytes-Synovial Fld: 1 % (ref 0–20)
Monocyte-Macrophage-Synovial Fluid: 7 % — ABNORMAL LOW (ref 50–90)
Neutrophil, Synovial: 92 % — ABNORMAL HIGH (ref 0–25)
WBC, Synovial: 17875 /mm3 — ABNORMAL HIGH (ref 0–200)

## 2020-07-11 MED ORDER — METHYLPREDNISOLONE ACETATE 40 MG/ML IJ SUSP
40.0000 mg | Freq: Once | INTRAMUSCULAR | Status: DC
  Filled 2020-07-11: qty 1

## 2020-07-11 MED ORDER — BUPIVACAINE HCL (PF) 0.5 % IJ SOLN
10.0000 mL | Freq: Once | INTRAMUSCULAR | Status: DC
  Filled 2020-07-11: qty 10

## 2020-07-11 MED ORDER — NAPROXEN 250 MG PO TABS
500.0000 mg | ORAL_TABLET | Freq: Two times a day (BID) | ORAL | Status: AC
  Administered 2020-07-11 – 2020-07-13 (×4): 500 mg via ORAL
  Filled 2020-07-11 (×4): qty 2

## 2020-07-11 MED ORDER — PANTOPRAZOLE SODIUM 40 MG PO TBEC
40.0000 mg | DELAYED_RELEASE_TABLET | Freq: Two times a day (BID) | ORAL | Status: DC
  Administered 2020-07-11 (×2): 40 mg via ORAL
  Filled 2020-07-11 (×3): qty 1

## 2020-07-11 NOTE — Plan of Care (Signed)
  Problem: Education: Goal: Ability to demonstrate management of disease process will improve Outcome: Progressing Goal: Ability to verbalize understanding of medication therapies will improve Outcome: Progressing   

## 2020-07-11 NOTE — Plan of Care (Signed)
  Problem: Cardiac: Goal: Ability to achieve and maintain adequate cardiopulmonary perfusion will improve Outcome: Progressing   Problem: Clinical Measurements: Goal: Ability to maintain clinical measurements within normal limits will improve Outcome: Progressing Goal: Will remain free from infection Outcome: Progressing Goal: Respiratory complications will improve Outcome: Progressing Goal: Cardiovascular complication will be avoided Outcome: Progressing

## 2020-07-11 NOTE — Care Management Important Message (Signed)
Important Message  Patient Details  Name: Sophia [name] MRN: [id number] Date of Birth: 1928/11/12   Medicare Important Message Given:  Yes     [name] 07/11/2020, 9:23 AM

## 2020-07-11 NOTE — Progress Notes (Signed)
Notified MD of patients BP. Pt alert and oriented. Denies lightheadedness, dizziness, chest pain. Repeated BP shows improvement.  Will let provider know if patient becomes symptomatic or BP is this low again.

## 2020-07-11 NOTE — TOC Initial Note (Signed)
Transition of Care [facility]) - Initial/Assessment Note    Patient Details  Name: Sophia [name] MRN: [id number] Date of Birth: 1929/01/27  Transition [facility] Care [facility] CM/SW Contact:    Tresa Endo Phone Number: 07/11/2020, 5:00 PM  Clinical Narrative:                 [id number]: CSW spoke with pt son who has concerns about pt rapid decline, CSW suggested speaking with DR about health concerns. CSW asked about pt SNF at [place], pt son states he does no think he can take care of the pt and he would like her to get inpatient short term rehab to be able to go back home. Pt son suggested [facility], CSW will fax out to [facility] and other surrounding areas. CSW will follow up with family once offers come in.   Expected Discharge Plan:  (TBD) Barriers to Discharge: Continued Medical Work up   Patient Goals and CMS Choice        Expected Discharge Plan and Services Expected Discharge Plan:  (TBD)   Discharge Planning Services: CM Consult                                          Prior Living Arrangements/Services   Lives with:: Adult Children                   Activities of Daily Living Home Assistive Devices/Equipment: Environmental consultant (specify type) ADL Screening (condition at time of admission) Is the patient deaf or have difficulty hearing?: No Does the patient have difficulty seeing, even when wearing glasses/contacts?: No Does the patient have difficulty concentrating, remembering, or making decisions?: No Patient able to express need for assistance with ADLs?: Yes Does the patient have difficulty dressing or bathing?: Yes Independently performs ADLs?: Yes (appropriate for developmental age) Does the patient have difficulty walking or climbing stairs?: Yes Weakness of Legs: Both Weakness of Arms/Hands: Both  Permission Sought/Granted                  Emotional Assessment              Admission diagnosis:  Shortness of breath [R06.02] CHF (congestive  heart failure) ([place]) [I50.9] Heart murmur [R01.1] Heart failure, unspecified HF chronicity, unspecified heart failure type ([place]) [I50.9] Patient Active Problem List   Diagnosis Date Noted  . Acute on chronic systolic CHF (congestive heart failure) ([place]) 07/06/2020  . Elevated troponin 07/06/2020  . Preventive measure 10/27/2019  . Vitamin D deficiency 12/02/2018  . Respiratory failure, chronic ([place]) 08/05/2017  . Anemia due to chronic blood loss   . GIB (gastrointestinal bleeding) 02/03/2017  . CKD (chronic kidney disease), stage III ([place]) 02/03/2017  . Chronic combined systolic (congestive) and diastolic (congestive) heart failure ([place]) 02/03/2017  . Orthostatic hypotension 01/12/2016  . Iron deficiency anemia 10/31/2015  . Cataract cortical, senile 07/22/2014  . Pancytopenia ([place])   . Chronic leukopenia 09/07/2013  . Deficiency anemia 08/24/2013  . GAVE (gastric antral vascular ectasia) 04/28/2012  . Chronic gastritis 01/09/2011  . Aortic stenosis 01/09/2011  . Epigastric pain 07/29/2010  . Hyperlipidemia 08/05/2006  . Iron deficiency anemia due to chronic blood loss 08/05/2006  . ANXIETY DEPRESSION 08/05/2006  . NEUROPATHY, IDIOPATHIC PERIPHERAL NEC 08/05/2006  . Essential hypertension 08/05/2006  . GASTROESOPHAGEAL REFLUX DISEASE 08/05/2006  . Osteoarthritis 08/05/2006  . DEGENERATIVE DISC DISEASE  08/05/2006  . OSTEOPOROSIS 08/05/2006  . SYMPTOM, INCONTINENCE, URINARY NOS 08/05/2006   PCP:  [name], MD Pharmacy:   [id number] - [place], [place] [address] [place] [address] Phone: [phone] Fax: [phone]     Social Determinants of Health (SDOH) Interventions    Readmission Risk Interventions No flowsheet data found.

## 2020-07-11 NOTE — Progress Notes (Signed)
This chaplain is present for F/U spiritual care and facilitating of notary and witnesses for notarizing of Pt. HCPOA.  The Pt. Son-[name] is at the bedside this morning, requesting F/U Palliative Care visit.  The chaplain understands [name] is concerned about transition of care plans.  The chaplain will update the PMT.  **[id number] This chaplain is present with the Pt., [name], the notary and witnesses for the notarizing of the Pt. HCPOA.  The Pt. named Sophia [name] as her healthcare agent.  The chaplain returned the original AD to the Pt. along with one copy.  The unit secretary scanned a copy into the Pt. EMR.  This chaplain is available for F/U spiritual care as needed.

## 2020-07-11 NOTE — Consult Note (Signed)
Reason for Consult:Right knee effusion Referring Physician: Breck Coons Time called: 1001 Time at bedside: Sophia [name] is an 87 y.o. female.  HPI: Sophia [name] was admitted last week with PNA. She has been battling right knee OA for years but on Friday began to c/o and a significant increase in her pain. The knee also swelled quite a bit which it has not done before. She is normally A&O but since this acute illness has had a large decline in her mental state and can't contribute much to history which was supplied by her son. She lives at home with him and doesn't ambulate much but was able to do some with a RW.  Past Medical History:  Diagnosis Date  . Anemia   . Anxiety   . Aortic stenosis   . Arthritis   . Blood transfusion   . Cataracts, bilateral    removed  . Congestive heart failure ([place])   . Depression   . Diverticulosis 03/14/10  . Duodenitis 03/15/10   per capsule endoscopy report  . DVT (deep venous thrombosis) ([place])    pt denies  . Dyslipidemia   . Falls    frequent falls  . GAVE (gastric antral vascular ectasia)   . GERD (gastroesophageal reflux disease)   . Heart murmur   . Hemorrhagic gastritis 03/15/10   per capsule endoscopy report  . Hiatal hernia   . Hyperlipidemia   . Hyperplastic colon polyp   . Hypertension   . Neuropathy    bil big toes  . Osteoporosis   . Presbyesophagus   . Unspecified deficiency anemia 08/24/2013   Iron infusion     Past Surgical History:  Procedure Laterality Date  . CATARACT EXTRACTION, BILATERAL    . CHOLECYSTECTOMY    . ENTEROSCOPY N/A 07/08/2014   Procedure: ENTEROSCOPY;  Surgeon: Lafayette Dragon, MD;  Location: [facility] ENDOSCOPY;  Service: Endoscopy;  Laterality: N/A;  . ESOPHAGOGASTRODUODENOSCOPY N/A 04/28/2012   Procedure: ESOPHAGOGASTRODUODENOSCOPY (EGD);  Surgeon: Lafayette Dragon, MD;  Location: [facility];  Service: Endoscopy;  Laterality: N/A;  . ESOPHAGOGASTRODUODENOSCOPY N/A 04/12/2014   Procedure:  ESOPHAGOGASTRODUODENOSCOPY (EGD);  Surgeon: Lafayette Dragon, MD;  Location: [facility];  Service: Endoscopy;  Laterality: N/A;  . ESOPHAGOGASTRODUODENOSCOPY N/A 02/06/2017   Procedure: ESOPHAGOGASTRODUODENOSCOPY (EGD);  Surgeon: Jerene Bears, MD;  Location: [facility] ENDOSCOPY;  Service: Gastroenterology;  Laterality: N/A;  . FOOT SURGERY  1998  . fracture left knee  1993   x 2   . HOT HEMOSTASIS N/A 04/28/2012   Procedure: HOT HEMOSTASIS (ARGON PLASMA COAGULATION/BICAP);  Surgeon: Lafayette Dragon, MD;  Location: [facility]OSCOPY;  Service: Endoscopy;  Laterality: N/A;  . HOT HEMOSTASIS N/A 04/12/2014   Procedure: HOT HEMOSTASIS (ARGON PLASMA COAGULATION/BICAP);  Surgeon: Lafayette Dragon, MD;  Location: [facility];  Service: Endoscopy;  Laterality: N/A;  . HOT HEMOSTASIS N/A 07/08/2014   Procedure: HOT HEMOSTASIS (ARGON PLASMA COAGULATION/BICAP);  Surgeon: Lafayette Dragon, MD;  Location: [facility] ENDOS[facility];  Service: Endoscopy;  Laterality: N/A;  . left eye cornea  surgery  yrs ago  . ORIF WRIST FRACTURE Right   . plantar fasciitis repair     left  . TUBAL LIGATION      Family History  Problem Relation Age of Onset  . Prostate cancer Father   . Uterine cancer Sister   . Kidney cancer Brother   . Kidney cancer Son   . Colon cancer Sister        mets from  uterine cancer    Social History:  reports that she quit smoking about 31 years ago. Her smoking use included cigarettes. She has a 10.00 pack-year smoking history. She has never used smokeless tobacco. She reports that she does not drink alcohol and does not use drugs.  Allergies:  Allergies  Allergen Reactions  . Nutritional Supplements Anaphylaxis  . [name] Shortness Of Breath  . [name] [Alendronate Sodium] Other (See Comments)    Reaction:  GI upset   . Lipitor [Atorvastatin] Other (See Comments)    Reaction:  Muscle aches   . Pregabalin Nausea And Vomiting  . [name] Nausea And Vomiting  . Wasp Venom Hives     Medications: I have reviewed the patient's current medications.  Results for orders placed or performed during the hospital encounter of 07/06/20 (from the past 48 hour(s))  Basic metabolic panel     Status: Abnormal   Collection Time: 07/10/20  4:19 AM  Result Value Ref Range   Sodium 135 135 - 145 mmol/L   Potassium 3.8 3.5 - 5.1 mmol/L   Chloride 104 98 - 111 mmol/L   CO2 24 22 - 32 mmol/L   Glucose, Bld 111 (H) 70 - 99 mg/dL    Comment: Glucose reference range applies only to samples taken after fasting for at least 8 hours.   BUN 24 (H) 8 - 23 mg/dL   Creatinine, Ser 1.13 (H) 0.44 - 1.00 mg/dL   Calcium 7.5 (L) 8.9 - 10.3 mg/dL   GFR, Estimated 46 (L) >60 mL/min    Comment: (NOTE) Calculated using the CKD-EPI Creatinine Equation (2021)    Anion gap 7 5 - 15    Comment: Performed at [facility] [address] [place], [place] [address]    DG Knee 1-2 Views Right  Result Date: 07/10/2020 CLINICAL DATA:  Right knee pain for 2 days. EXAM: RIGHT KNEE - 1-2 VIEW COMPARISON:  Right knee radiographs dated 12/31/2007. FINDINGS: No evidence of acute fracture or dislocation. There is a moderate right knee joint effusion. There is moderate osteoarthritis of the lateral compartment and mild osteoarthritis of the medial and patellofemoral compartments. Soft tissues are unremarkable. IMPRESSION: Moderate knee joint effusion. Mild-to-moderate tricompartmental osteoarthritis. Electronically Signed   By: Zerita Boers M.D.   On: 07/10/2020 10:57    Review of Systems  Unable to perform ROS: Mental status change   Blood pressure (!) 99/47, pulse 95, temperature 98 F (36.7 C), resp. rate 18, weight 87.1 kg, SpO2 98 %. Physical Exam Constitutional:      General: She is not in acute distress.    Appearance: She is well-developed. She is not diaphoretic.  HENT:     Head: Normocephalic and atraumatic.  Eyes:     General: No scleral icterus.       Right eye: No discharge.         Left eye: No discharge.     Conjunctiva/sclera: Conjunctivae normal.  Cardiovascular:     Rate and Rhythm: Normal rate and regular rhythm.  Pulmonary:     Effort: Pulmonary effort is normal. No respiratory distress.  Musculoskeletal:     Cervical back: Normal range of motion.     Comments: RLE No traumatic wounds, ecchymosis, or rash  Mild TTP  Large knee effusion  Knee stable to varus/ valgus and anterior/posterior stress  Sens DPN, SPN, TN grossly intact  Motor EHL, ext, flex, evers grossly intact  DP 2+, PT 1+, No significant edema  Skin:    General: Skin is warm and dry.  Neurological:     Mental Status: She is alert.  Psychiatric:        Behavior: Behavior normal.     Assessment/Plan: Right knee effusion -- Will aspirate and send for usual labs.    Lisette Abu, PA-C Orthopedic Surgery [phone] 07/11/2020, 11:51 AM

## 2020-07-11 NOTE — Procedures (Signed)
Procedure: Right knee aspiration and injection  Indication: Right knee effusion(s)  Surgeon: Silvestre Gunner, PA-C  Assist: None  Anesthesia: Topical refrigerant  EBL: None  Complications: None  Findings: After risks/benefits explained patient desires to undergo procedure. Consent obtained and time out performed. The right knee was sterilely prepped and aspirated. 1ml clear yellow fluid obtained. 52ml 0.5% Marcaine and 40mg  depomedrol instilled. Pt tolerated the procedure well.    Lisette Abu, PA-C Orthopedic Surgery [phone]

## 2020-07-11 NOTE — NC FL2 (Signed)
[place] LEVEL OF CARE SCREENING TOOL     IDENTIFICATION  Patient Name: Sophia [name] Birthdate: 22-Oct-1928 Sex: female Admission Date (Current Location): 07/06/2020  [facility] and [place] Number:  Herbalist and Address:  [facility], [place] [address] [place], [place] [address]      Provider Number: [id number]  Attending Physician Name and Address:  Charlynne Cousins, MD  Relative Name and Phone Number:  Ezzie Senat, [phone]    Current Level of Care: Hospital Recommended Level of Care: [place] Prior Approval Number:    Date Approved/Denied:   PASRR Number: [id number]  Discharge Plan: SNF    Current Diagnoses: Patient Active Problem List   Diagnosis Date Noted  . Acute on chronic systolic CHF (congestive heart failure) ([place]) 07/06/2020  . Elevated troponin 07/06/2020  . Preventive measure 10/27/2019  . Vitamin D deficiency 12/02/2018  . Respiratory failure, chronic ([place]) 08/05/2017  . Anemia due to chronic blood loss   . GIB (gastrointestinal bleeding) 02/03/2017  . CKD (chronic kidney disease), stage III ([place]) 02/03/2017  . Chronic combined systolic (congestive) and diastolic (congestive) heart failure ([place]) 02/03/2017  . Orthostatic hypotension 01/12/2016  . Iron deficiency anemia 10/31/2015  . Cataract cortical, senile 07/22/2014  . Pancytopenia ([place])   . Chronic leukopenia 09/07/2013  . Deficiency anemia 08/24/2013  . GAVE (gastric antral vascular ectasia) 04/28/2012  . Chronic gastritis 01/09/2011  . Aortic stenosis 01/09/2011  . Epigastric pain 07/29/2010  . Hyperlipidemia 08/05/2006  . Iron deficiency anemia due to chronic blood loss 08/05/2006  . ANXIETY DEPRESSION 08/05/2006  . NEUROPATHY, IDIOPATHIC PERIPHERAL NEC 08/05/2006  . Essential hypertension 08/05/2006  . GASTROESOPHAGEAL REFLUX DISEASE 08/05/2006  . Osteoarthritis 08/05/2006  . [place] DISEASE  08/05/2006  . OSTEOPOROSIS 08/05/2006  . SYMPTOM, INCONTINENCE, URINARY NOS 08/05/2006    Orientation RESPIRATION BLADDER Height & Weight     Self,Time,Situation,Place  O2 (NC3) Incontinent,External catheter Weight: 192 lb 0.3 oz (87.1 kg) Height:     BEHAVIORAL SYMPTOMS/MOOD NEUROLOGICAL BOWEL NUTRITION STATUS      Incontinent Diet (See DC summary)  AMBULATORY STATUS COMMUNICATION OF NEEDS Skin   Total Care Verbally Skin abrasions (Ecchymosis R and L arms)                       Personal Care Assistance Level of Assistance  Bathing,Feeding,Dressing Bathing Assistance: Limited assistance Feeding assistance: Independent Dressing Assistance: Limited assistance     Functional Limitations Info  Sight,Hearing,Speech Sight Info: Adequate Hearing Info: Adequate Speech Info: Adequate    SPECIAL CARE FACTORS FREQUENCY  PT (By licensed PT),OT (By licensed OT)     PT Frequency: 5x week OT Frequency: 5x week            Contractures Contractures Info: Not present    Additional Factors Info  Code Status,Allergies,Psychotropic Code Status Info: DNR Allergies Info: Nutritional Supplements   [name]   [name] (Alendronate Sodium)   Lipitor (Atorvastatin)   Pregabalin   [name]   Wasp Venom Psychotropic Info: Sertraline (Zoloft)         Current Medications (07/11/2020):  This is the current hospital active medication list Current Facility-Administered Medications  Medication Dose Route Frequency Provider Last Rate Last Admin  . 0.9 %  sodium chloride infusion  250 mL Intravenous PRN Fuller Plan A, MD      . acetaminophen (TYLENOL) tablet 650 mg  650 mg Oral Q4H PRN Fuller Plan  A, MD   650 mg at 07/10/20 1735  . ALPRAZolam Duanne Moron) tablet 0.25-0.5 mg  0.25-0.5 mg Oral QID PRN Fuller Plan A, MD   0.5 mg at 07/11/20 0505  . amoxicillin-clavulanate (AUGMENTIN) 875-125 MG per tablet 1 tablet  1 tablet Oral Q12H Charlynne Cousins, MD   1 tablet at  07/11/20 1027  . bupivacaine (MARCAINE) 0.5 % injection 10 mL  10 mL Infiltration Once Lisette Abu, PA-C      . cyanocobalamin ((VITAMIN B-12)) injection 1,000 mcg  1,000 mcg Subcutaneous Daily Charlynne Cousins, MD   1,000 mcg at 07/11/20 1031  . enoxaparin (LOVENOX) injection 30 mg  30 mg Subcutaneous Q24H Charlynne Cousins, MD   30 mg at 07/11/20 1307  . gabapentin (NEURONTIN) capsule 800 mg  800 mg Oral BID PRN Fuller Plan A, MD   800 mg at 07/11/20 0506  . lidocaine (LIDODERM) 5 % 1 patch  1 patch Transdermal Q24H Fuller Plan A, MD   1 patch at 07/11/20 1600  . lidocaine (LIDODERM) 5 % 1 patch  1 patch Transdermal Q24H Fuller Plan A, MD   1 patch at 07/11/20 1600  . methylPREDNISolone acetate (DEPO-MEDROL) injection 40 mg  40 mg Intra-articular Once Lisette Abu, PA-C      . naproxen (NAPROSYN) tablet 500 mg  500 mg Oral BID WC Charlynne Cousins, MD      . ondansetron [facility]) injection 4 mg  4 mg Intravenous Q6H PRN Fuller Plan A, MD      . pantoprazole (PROTONIX) EC tablet 40 mg  40 mg Oral BID Charlynne Cousins, MD   40 mg at 07/11/20 1027  . rOPINIRole (REQUIP) tablet 0.25 mg  0.25 mg Oral QPM Philis Pique, NP   0.25 mg at 07/10/20 1735  . sertraline (ZOLOFT) tablet 100 mg  100 mg Oral Daily Tamala Julian, Rondell A, MD   100 mg at 07/11/20 1028  . sodium chloride flush (NS) 0.9 % injection 3 mL  3 mL Intravenous Q12H Mazi Schuff, Rondell A, MD   3 mL at 07/11/20 1313  . sodium chloride flush (NS) 0.9 % injection 3 mL  3 mL Intravenous PRN Kenyia [place], Rondell A, MD      . verapamil (CALAN-SR) CR tablet 240 mg  240 mg Oral Daily Tamala Julian, Rondell A, MD   240 mg at 07/11/20 1028     Discharge Medications: Please see discharge summary for a list of discharge medications.  Relevant Imaging Results:  Relevant Lab Results:   Additional Information SSN: [place]  [place], [place]

## 2020-07-11 NOTE — Progress Notes (Signed)
[facility] PROGRESS NOTE    Progress Note  Sophia [name]  CMK:[id number] DOB: [birth year] DOA: 07/06/2020 PCP: [name], MD     Brief Narrative:   Sophia [name] is an 87 y.o. female past medical history significant for EF of 40% moderate aortic stenosis chronic respiratory failure is on 2 L of oxygen, comes into the hospital complaining of shortness of breath started the day prior to admission.  Chest x-ray showed left midlung interstitial thickening, BNP of 800   Assessment/Plan:   Acute on chronic respiratory failure with hypoxia likely due to community-acquired pneumonia: Transition to Augmentin and azithromycin. Blood cultures are negative till date, respiratory virus panel is negative Physical therapy evaluated the patient and recommended home health PT.  Acute kidney injury on CKD IIIa: Likely due to overdiuresis, resolved with IV fluid hydration her creatinine has returned to baseline 1.1.  Right knee pain: She relates she fell about 2 weeks ago, since then her knee is bothering. Right knee x-ray shows a large effusion, she does not have any history of gout or pseudogout she is afebrile with no leukocytosis. Will perform arthrocentesis and for cell count and crystals. Start naproxen p.o. twice daily she is already on Protonix  Vitamin B12 deficiency: Will start on B12 142, will start B12 daily.  Chronic pancytopenia: Appears similar to previous.  Elevated troponins: She denies any chest pain, likely due to demand ischemia.  Essential hypertension: Continue verapamil.  Left shoulder pain and hip pain secondary to fall at home: Likely due to mechanical fall, consult PT OT.  DVT prophylaxis: lovenox Family Communication:none Status is: Inpatient  Remains inpatient appropriate because:Hemodynamically unstable   Dispo: The patient is from: SNF              Anticipated d/c is to: SNF              Patient currently is not medically stable to  d/c.   Difficult to place patient No  Code Status:     Code Status Orders  (From admission, onward)         Start     Ordered   07/06/20 1209  Full code  Continuous        07/06/20 1208        Code Status History    Date Active Date Inactive Code Status Order ID Comments User Context   02/03/2017 0400 02/08/2017 1738 Full Code [id number]  Sophia Costa, MD ED   01/12/2016 1631 01/13/2016 1256 Full Code [id number]  [name], MD Inpatient   Advance Care Planning Activity        IV Access:    Peripheral IV   Procedures and diagnostic studies:   DG Knee 1-2 Views Right  Result Date: 07/10/2020 CLINICAL DATA:  Right knee pain for 2 days. EXAM: RIGHT KNEE - 1-2 VIEW COMPARISON:  Right knee radiographs dated 12/31/2007. FINDINGS: No evidence of acute fracture or dislocation. There is a moderate right knee joint effusion. There is moderate osteoarthritis of the lateral compartment and mild osteoarthritis of the medial and patellofemoral compartments. Soft tissues are unremarkable. IMPRESSION: Moderate knee joint effusion. Mild-to-moderate tricompartmental osteoarthritis. Electronically Signed   By: Zerita Boers M.D.   On: 07/10/2020 10:57     Medical Consultants:    None.   Subjective:    Sophia [name] patient is still complaining. Objective:    Vitals:   07/10/20 2014 07/10/20 2048 07/11/20 0225 07/11/20 0505  BP: 111/61 111/61  126/69  Pulse: 72   88  Resp: 18   20  Temp: 98.4 F (36.9 C)   97.6 F (36.4 C)  TempSrc:    Oral  SpO2:    99%  Weight:   87.1 kg    SpO2: 99 % O2 Flow Rate (L/min): 3 L/min   Intake/Output Summary (Last 24 hours) at 07/11/2020 0831 Last data filed at 07/11/2020 0500 Gross per 24 hour  Intake 800 ml  Output --  Net 800 ml   Filed Weights   07/10/20 0004 07/10/20 0500 07/11/20 0225  Weight: 84.7 kg 85.5 kg 87.1 kg    Exam: General exam: In no acute distress. Respiratory system: Good air movement and clear to  auscultation. Cardiovascular system: S1 & S2 heard, RRR. No JVD. Gastrointestinal system: Abdomen is nondistended, soft and nontender.  Extremities: No pedal edema. Skin: No rashes, lesions or ulcers Psychiatry: Judgement and insight appear normal. Mood & affect appropriate.   Data Reviewed:    Labs: Basic Metabolic Panel: Recent Labs  Lab 07/06/20 0825 07/07/20 0405 07/08/20 0415 07/09/20 0141 07/10/20 0419  NA 136 134* 134* 137 135  K 4.0 3.6 3.6 3.6 3.8  CL 103 96* 94* 103 104  CO2 27 27 28 24 24   GLUCOSE 102* 105* 110* 98 111*  BUN 17 21 35* 27* 24*  CREATININE 1.09* 1.29* 2.09* 1.31* 1.13*  CALCIUM 8.8* 8.7* 8.4* 7.6* 7.5*   GFR Estimated Creatinine Clearance: 34.6 mL/min (A) (by C-G formula based on SCr of 1.13 mg/dL (H)). Liver Function Tests: No results for input(s): AST, ALT, ALKPHOS, BILITOT, PROT, ALBUMIN in the last 168 hours. No results for input(s): LIPASE, AMYLASE in the last 168 hours. No results for input(s): AMMONIA in the last 168 hours. Coagulation profile No results for input(s): INR, PROTIME in the last 168 hours. COVID-19 Labs  No results for input(s): DDIMER, FERRITIN, LDH, CRP in the last 72 hours.  Lab Results  Component Value Date   [place] NEGATIVE 07/06/2020    CBC: Recent Labs  Lab 07/06/20 0825  WBC 3.4*  NEUTROABS 2.6  HGB 13.7  HCT 42.3  MCV 103.2*  PLT 147*   Cardiac Enzymes: No results for input(s): CKTOTAL, CKMB, CKMBINDEX, TROPONINI in the last 168 hours. BNP (last 3 results) No results for input(s): PROBNP in the last 8760 hours. CBG: No results for input(s): GLUCAP in the last 168 hours. D-Dimer: No results for input(s): DDIMER in the last 72 hours. Hgb A1c: No results for input(s): HGBA1C in the last 72 hours. Lipid Profile: No results for input(s): CHOL, HDL, LDLCALC, TRIG, CHOLHDL, LDLDIRECT in the last 72 hours. Thyroid function studies: No results for input(s): TSH, T4TOTAL, T3FREE, THYROIDAB in the  last 72 hours.  Invalid input(s): FREET3 Anemia work up: No results for input(s): VITAMINB12, FOLATE, FERRITIN, TIBC, IRON, RETICCTPCT in the last 72 hours. Sepsis Labs: Recent Labs  Lab 07/06/20 0825 07/07/20 0909 07/08/20 0415 07/09/20 0141  PROCALCITON  --  0.48 0.43 0.14  WBC 3.4*  --   --   --    Microbiology Recent Results (from the past 240 hour(s))  Resp Panel by RT-PCR (Flu A&B, Covid) Nasopharyngeal Swab     Status: None   Collection Time: 07/06/20  8:51 AM   Specimen: Nasopharyngeal Swab; Nasopharyngeal(NP) swabs in vial transport medium  Result Value Ref Range Status   SARS Coronavirus 2 by RT PCR NEGATIVE NEGATIVE Final    Comment: (NOTE) SARS-CoV-2 target nucleic acids are NOT DETECTED.  The SARS-CoV-2 RNA is generally detectable in upper respiratory specimens during the acute phase of infection. The lowest concentration of SARS-CoV-2 viral copies this assay can detect is 138 copies/mL. A negative result does not preclude SARS-Cov-2 infection and should not be used as the sole basis for treatment or other patient management decisions. A negative result may occur with  improper specimen collection/handling, submission of specimen other than nasopharyngeal swab, presence of viral mutation(s) within the areas targeted by this assay, and inadequate number of viral copies(<138 copies/mL). A negative result must be combined with clinical observations, patient history, and epidemiological information. The expected result is Negative.  Fact Sheet for Patients:  EntrepreneurPulse.com.au  Fact Sheet for Healthcare Providers:  IncredibleEmployment.be  This test is no t yet approved or cleared by the [place] FDA and  has been authorized for detection and/or diagnosis of SARS-CoV-2 by FDA under an Emergency Use Authorization (EUA). This EUA will remain  in effect (meaning this test can be used) for the duration of the COVID-19  declaration under Section 564(b)(1) of the Act, 21 U.S.C.section 360bbb-3(b)(1), unless the authorization is terminated  or revoked sooner.       Influenza A by PCR NEGATIVE NEGATIVE Final   Influenza B by PCR NEGATIVE NEGATIVE Final    Comment: (NOTE) The Xpert Xpress SARS-CoV-2/FLU/RSV plus assay is intended as an aid in the diagnosis of influenza from Nasopharyngeal swab specimens and should not be used as a sole basis for treatment. Nasal washings and aspirates are unacceptable for Xpert Xpress SARS-CoV-2/FLU/RSV testing.  Fact Sheet for Patients: EntrepreneurPulse.com.au  Fact Sheet for Healthcare Providers: IncredibleEmployment.be  This test is not yet approved or cleared by the [place] FDA and has been authorized for detection and/or diagnosis of SARS-CoV-2 by FDA under an Emergency Use Authorization (EUA). This EUA will remain in effect (meaning this test can be used) for the duration of the COVID-19 declaration under Section 564(b)(1) of the Act, 21 U.S.C. section 360bbb-3(b)(1), unless the authorization is terminated or revoked.  Performed at [facility], [place] [address] [place], [place] [address]   Culture, blood (routine x 2) Call MD if unable to obtain prior to antibiotics being given     Status: None (Preliminary result)   Collection Time: 07/07/20  9:06 AM   Specimen: Left Antecubital; Blood  Result Value Ref Range Status   Specimen Description LEFT ANTECUBITAL  Final   Special Requests   Final    BOTTLES DRAWN AEROBIC AND ANAEROBIC Blood Culture results may not be optimal due to an inadequate volume of blood received in culture bottles   Culture   Final    NO GROWTH 4 DAYS Performed at [place] [address] [place], [place] [address]    Report Status PENDING  Incomplete  Culture, blood (routine x 2) Call MD if unable to obtain prior to antibiotics being given     Status: None (Preliminary result)    Collection Time: 07/07/20  9:09 AM   Specimen: BLOOD LEFT FOREARM  Result Value Ref Range Status   Specimen Description BLOOD LEFT FOREARM  Final   Special Requests   Final    BOTTLES DRAWN AEROBIC ONLY Blood Culture adequate volume   Culture   Final    NO GROWTH 4 DAYS Performed at [facility], [address], [place], [place] [address]    Report Status PENDING  Incomplete  Respiratory (~20 pathogens) panel by PCR     Status:  None   Collection Time: 07/07/20 10:59 AM   Specimen: Nasopharyngeal Swab; Respiratory  Result Value Ref Range Status   Adenovirus NOT DETECTED NOT DETECTED Final   Coronavirus 229E NOT DETECTED NOT DETECTED Final    Comment: (NOTE) The Coronavirus on the Respiratory Panel, DOES NOT test for the novel  Coronavirus (2019 nCoV)    Coronavirus HKU1 NOT DETECTED NOT DETECTED Final   Coronavirus NL63 NOT DETECTED NOT DETECTED Final   Coronavirus OC43 NOT DETECTED NOT DETECTED Final   Metapneumovirus NOT DETECTED NOT DETECTED Final   Rhinovirus / Enterovirus NOT DETECTED NOT DETECTED Final   Influenza A NOT DETECTED NOT DETECTED Final   Influenza B NOT DETECTED NOT DETECTED Final   Parainfluenza Virus 1 NOT DETECTED NOT DETECTED Final   Parainfluenza Virus 2 NOT DETECTED NOT DETECTED Final   Parainfluenza Virus 3 NOT DETECTED NOT DETECTED Final   Parainfluenza Virus 4 NOT DETECTED NOT DETECTED Final   Respiratory Syncytial Virus NOT DETECTED NOT DETECTED Final   Bordetella pertussis NOT DETECTED NOT DETECTED Final   Bordetella Parapertussis NOT DETECTED NOT DETECTED Final   Chlamydophila pneumoniae NOT DETECTED NOT DETECTED Final   Mycoplasma pneumoniae NOT DETECTED NOT DETECTED Final    Comment: Performed at [facility], [address] [place], [place]  [address]     Medications:   . amoxicillin-clavulanate  1 tablet Oral Q12H  . azithromycin  500 mg Oral Daily  . cyanocobalamin  1,000 mcg Subcutaneous Daily  . enoxaparin (LOVENOX)  injection  30 mg Subcutaneous Q24H  . lidocaine  1 patch Transdermal Q24H  . lidocaine  1 patch Transdermal Q24H  . pantoprazole  40 mg Oral Daily  . rOPINIRole  0.25 mg Oral QPM  . sertraline  100 mg Oral Daily  . sodium chloride flush  3 mL Intravenous Q12H  . verapamil  240 mg Oral Daily   Continuous Infusions: . sodium chloride        LOS: 5 days   Charlynne Cousins  [facility]  07/11/2020, 8:31 AM

## 2020-07-12 DIAGNOSIS — M25561 Pain in right knee: Secondary | ICD-10-CM

## 2020-07-12 LAB — CULTURE, BLOOD (ROUTINE X 2)
Culture: NO GROWTH
Culture: NO GROWTH
Special Requests: ADEQUATE

## 2020-07-12 LAB — SARS CORONAVIRUS 2 (TAT 6-24 HRS): SARS Coronavirus 2: NEGATIVE

## 2020-07-12 MED ORDER — DEXLANSOPRAZOLE 60 MG PO CPDR
60.0000 mg | DELAYED_RELEASE_CAPSULE | Freq: Every day | ORAL | Status: DC
  Administered 2020-07-12 – 2020-07-14 (×3): 60 mg via ORAL
  Filled 2020-07-12 (×9): qty 1

## 2020-07-12 NOTE — TOC Progression Note (Addendum)
Transition of Care [facility]) - Progression Note    Patient Details  Name: [name] MRN: [id number] Date of Birth: [birth year]  [facility] CM/SW Contact  Reece Agar, [place] Phone Number: 07/12/2020, 4:15 PM  Clinical Narrative:    1612: CSW spoke with pt son to provide update on [facility] not having bed availability. Pt son asked about [facility], [facility] contacted [facility] with no answer VM left and send message via text no answer. CSW will follow up with pt son.  89: CSW spoke with pt son in room and provided medicare.gov resource and marked facilities that have accepted. Pt would like to be faxed out to [facility] and [facility]. CSW will follow up.   Expected Discharge Plan:  (TBD) Barriers to Discharge: Continued Medical Work up  Expected Discharge Plan and Services Expected Discharge Plan:  (TBD)   Discharge Planning Services: CM Consult                                           Social Determinants of Health (SDOH) Interventions    Readmission Risk Interventions No flowsheet data found.

## 2020-07-12 NOTE — Progress Notes (Signed)
Physical Therapy Treatment Patient Details Name: Sophia [name] MRN: [id number] DOB: 09/08/1928 Today's Date: 07/12/2020    History of Present Illness Pt is a 87 y.o. female admitted 07/06/20 with SOB; of note, recent fall OOB last week with c/o L shoulder pain. Workup for acute on chornic hypoxic respiratory failure likely due to CAP, AKI. L shoulder and pelvic imaging negative for acute injury. Pt with chronic R knee pain; imaging showed large effusion. S/p R knee aspiration and injection on 6/6. PMH includes CHF, chronic resp failure (2L O2 baseline), OA, cataracts, neuropathy, anxiety.   PT Comments    Pt progressing with mobility. Today's session focused on standing transfers and gait initiation with RW, pt requiring consistent modA for stability and RW management. Pt limited by significant anxiety related to fearful of falling, as well as generalized weakness, decreased activity tolerance and impaired balance strategies. Pt's son present and supportive; family now in agreement with recommendation for post-acute rehab at SNF to maximize functional mobility and independence prior to return home. Will continue to follow acutely to address established goals.  SpO2 93% on 3L O2 [place]    Follow Up Recommendations  SNF;Supervision for mobility/OOB     Equipment Recommendations  Wheelchair (measurements PT);Wheelchair cushion (measurements PT);Hospital bed    Recommendations for Other Services       Precautions / Restrictions Precautions Precautions: Fall;Other (comment) Precaution Comments: Urine incontinence; 2L O2 baseline; fearful of falling, prefers having shoes on (in room) Restrictions Weight Bearing Restrictions: No    Mobility  Bed Mobility Overal bed mobility: Needs Assistance Bed Mobility: Supine to Sit     Supine to sit: Mod assist;HOB elevated     General bed mobility comments: Maneuvering self to EOB well with bed rail, modA for scooting hips to EOB     Transfers Overall transfer level: Needs assistance Equipment used: Rolling walker (2 wheeled) Transfers: Sit to/from Stand Sit to Stand: Mod assist         General transfer comment: Multiple sit<>stands from EOB, BSC and recliner with RW, pt requires consistent modA for trunk elevation and stability, cues for hand placement; pt becomes very anxious upon standing with BUE shaking requiring external assist to stabilize RW; frequent cues for relaxation and safe technique  Ambulation/Gait Ambulation/Gait assistance: Min assist;Mod assist Gait Distance (Feet): 2 Feet Assistive device: Rolling walker (2 wheeled) Gait Pattern/deviations: Step-to pattern;Leaning posteriorly;Trunk flexed;Shuffle Gait velocity: Decreased   General Gait Details: Pt hesitant to initiate steps requiring max encouragement; able to take pivotal steps from bed to [facility], then BSC to recliner with minA for trunk stability, requiring modA for RW management; pt fearful of falling with tremulous BUEs requiring assist to stabilize RW; max verbal cues for relaxation and technique; significant SOB   Stairs             Wheelchair Mobility    Modified Rankin (Stroke Patients Only)       Balance Overall balance assessment: Needs assistance Sitting-balance support: No upper extremity supported;Feet supported Sitting balance-Leahy Scale: Fair     Standing balance support: Bilateral upper extremity supported Standing balance-Leahy Scale: Poor Standing balance comment: Reliant on BUE support and external assist; dependent for posterior pericare                            Cognition Arousal/Alertness: Awake/alert Behavior During Therapy: Anxious Overall Cognitive Status: Impaired/Different from baseline Area of Impairment: Attention;Memory;Following commands;Awareness;Problem solving  Current Attention Level: Selective Memory: Decreased short-term memory Following Commands:  Follows one step commands consistently;Follows one step commands with increased time   Awareness: Emergent Problem Solving: Requires verbal cues General Comments: Suspect apparent cognitive impairments more related to significant anxiety with mobility, as opposed to true cognitive deficits. May have some baseline decreased short-term memory      Exercises      General Comments General comments (skin integrity, edema, etc.): SpO2 93% on 3L O2 [place] post-mobility. Increased time discussing anxiety related to mobility, as this seems to be pt's main limiting factor along with generalized weakness. Pt's son in and out during session, supportive; son reports plan for short-term SNF for rehab before returning home      Pertinent Vitals/Pain Pain Assessment: Faces Faces Pain Scale: Hurts a little bit Pain Location: Generalized Pain Descriptors / Indicators: Discomfort Pain Intervention(s): Monitored during session;Limited activity within patient's tolerance    Home Living                      Prior Function            PT Goals (current goals can now be found in the care plan section) Acute Rehab PT Goals Patient Stated Goal: post-acute rehab at SNF to regain strength PT Goal Formulation: With patient/family Progress towards PT goals: Progressing toward goals    Frequency    Min 2X/week      PT Plan Frequency needs to be updated    Co-evaluation              AM-PAC PT "6 Clicks" Mobility   Outcome Measure  Help needed turning from your back to your side while in a flat bed without using bedrails?: A Little Help needed moving from lying on your back to sitting on the side of a flat bed without using bedrails?: A Lot Help needed moving to and from a bed to a chair (including a wheelchair)?: A Lot Help needed standing up from a chair using your arms (e.g., wheelchair or bedside chair)?: A Lot Help needed to walk in hospital room?: A Lot Help needed climbing 3-5  steps with a railing? : Total 6 Click Score: 12    End of Session   Activity Tolerance: Patient tolerated treatment well;Other (comment) (limited by anxiety) Patient left: in chair;with call bell/phone within reach;with chair alarm set;with family/visitor present Nurse Communication: Mobility status PT Visit Diagnosis: Unsteadiness on feet (R26.81);Other abnormalities of gait and mobility (R26.89);Muscle weakness (generalized) (M62.81);History of falling (Z91.81)     Time: 1610-9604 PT Time Calculation (min) (ACUTE ONLY): 35 min  Charges:  $Therapeutic Activity: 23-37 mins                     Mabeline Caras, PT, DPT Acute Rehabilitation Services  Pager [phone] Office [employer] 07/12/2020, 1:54 PM

## 2020-07-12 NOTE — Progress Notes (Signed)
[facility] PROGRESS NOTE    Progress Note  Sophia [name]  YSA:[id number] DOB: [birth year] DOA: 07/06/2020 PCP: [name], MD     Brief Narrative:   Sophia [name] is an 87 y.o. female past medical history significant for EF of 40% moderate aortic stenosis chronic respiratory failure is on 2 L of oxygen, comes into the hospital complaining of shortness of breath started the day prior to admission.  Chest x-ray showed left midlung interstitial thickening, BNP of 800   Assessment/Plan:   Acute on chronic respiratory failure with hypoxia likely due to community-acquired pneumonia: Transition to Augmentin and azithromycin. Blood cultures are negative till date, respiratory virus panel is negative Physical therapy evaluated the patient and recommended home health PT as son refused skilled nursing facility. Valsartan saying that he would like her placed at skilled.  Acute kidney injury on CKD IIIa: Likely due to overdiuresis, resolved with IV fluid hydration her creatinine has returned to baseline 1.1.  Right knee pain probably due to osteoarthritis: Son is at bedside and relays she has been having trouble with his knee for over 5 years. Knee x-ray was done that showed a large effusion orthopedic surgery was consulted which performed more arthrocentesis that showed 20,000 white blood cell count mainly PMN  no crystals she has remained afebrile, stain negative cultures has been sent she has remained afebrile with no leukocytosis there is likely osteoarthritis leading to inflammatory in nature. Started naproxen p.o. twice daily she is already on Protonix  Vitamin B12 deficiency: Will start on B12 142, continue B12 daily after 7 days then monthly.  Chronic pancytopenia: Appears similar to previous.  Elevated troponins: She denies any chest pain, likely due to demand ischemia.  Essential hypertension: Continue verapamil.  Left shoulder pain and hip pain secondary to  fall at home: Likely due to mechanical fall, consult PT OT.  DVT prophylaxis: lovenox Family Communication:none Status is: Inpatient  Remains inpatient appropriate because:Hemodynamically unstable   Dispo: The patient is from: SNF              Anticipated d/c is to: SNF              Patient currently is not medically stable to d/c.   Difficult to place patient No  Code Status:     Code Status Orders  (From admission, onward)         Start     Ordered   07/06/20 1209  Full code  Continuous        07/06/20 1208        Code Status History    Date Active Date Inactive Code Status Order ID Comments User Context   02/03/2017 0400 02/08/2017 1738 Full Code [id number]  Ivor Costa, MD ED   01/12/2016 1631 01/13/2016 1256 Full Code [id number]  [name], MD Inpatient   Advance Care Planning Activity        IV Access:    Peripheral IV   Procedures and diagnostic studies:   No results found.   Medical Consultants:    None.   Subjective:    Sophia [name] patient is still complaining. Objective:    Vitals:   07/11/20 2135 07/11/20 2255 07/12/20 0500 07/12/20 0519  BP: (!) 88/60 (!) 98/59  (!) 112/59  Pulse:  69  75  Resp:    18  Temp:    98.4 F (36.9 C)  TempSrc:    Oral  SpO2:    98%  Weight:  86 kg    SpO2: 98 % O2 Flow Rate (L/min): 2 L/min   Intake/Output Summary (Last 24 hours) at 07/12/2020 1027 Last data filed at 07/11/2020 1651 Gross per 24 hour  Intake --  Output 550 ml  Net -550 ml   Filed Weights   07/10/20 0500 07/11/20 0225 07/12/20 0500  Weight: 85.5 kg 87.1 kg 86 kg    Exam: General exam: In no acute distress. Respiratory system: Good air movement and clear to auscultation. Cardiovascular system: S1 & S2 heard, RRR. No JVD. Gastrointestinal system: Abdomen is nondistended, soft and nontender.  Extremities: No pedal edema. Skin: No rashes, lesions or ulcers Psychiatry: Judgement and insight appear normal. Mood & affect  appropriate.   Data Reviewed:    Labs: Basic Metabolic Panel: Recent Labs  Lab 07/06/20 0825 07/07/20 0405 07/08/20 0415 07/09/20 0141 07/10/20 0419  NA 136 134* 134* 137 135  K 4.0 3.6 3.6 3.6 3.8  CL 103 96* 94* 103 104  CO2 27 27 28 24 24   GLUCOSE 102* 105* 110* 98 111*  BUN 17 21 35* 27* 24*  CREATININE 1.09* 1.29* 2.09* 1.31* 1.13*  CALCIUM 8.8* 8.7* 8.4* 7.6* 7.5*   GFR Estimated Creatinine Clearance: 34.4 mL/min (A) (by C-G formula based on SCr of 1.13 mg/dL (H)). Liver Function Tests: No results for input(s): AST, ALT, ALKPHOS, BILITOT, PROT, ALBUMIN in the last 168 hours. No results for input(s): LIPASE, AMYLASE in the last 168 hours. No results for input(s): AMMONIA in the last 168 hours. Coagulation profile No results for input(s): INR, PROTIME in the last 168 hours. COVID-19 Labs  No results for input(s): DDIMER, FERRITIN, LDH, CRP in the last 72 hours.  Lab Results  Component Value Date   [place] NEGATIVE 07/06/2020    CBC: Recent Labs  Lab 07/06/20 0825  WBC 3.4*  NEUTROABS 2.6  HGB 13.7  HCT 42.3  MCV 103.2*  PLT 147*   Cardiac Enzymes: No results for input(s): CKTOTAL, CKMB, CKMBINDEX, TROPONINI in the last 168 hours. BNP (last 3 results) No results for input(s): PROBNP in the last 8760 hours. CBG: No results for input(s): GLUCAP in the last 168 hours. D-Dimer: No results for input(s): DDIMER in the last 72 hours. Hgb A1c: No results for input(s): HGBA1C in the last 72 hours. Lipid Profile: No results for input(s): CHOL, HDL, LDLCALC, TRIG, CHOLHDL, LDLDIRECT in the last 72 hours. Thyroid function studies: No results for input(s): TSH, T4TOTAL, T3FREE, THYROIDAB in the last 72 hours.  Invalid input(s): FREET3 Anemia work up: No results for input(s): VITAMINB12, FOLATE, FERRITIN, TIBC, IRON, RETICCTPCT in the last 72 hours. Sepsis Labs: Recent Labs  Lab 07/06/20 0825 07/07/20 0909 07/08/20 0415 07/09/20 0141  PROCALCITON   --  0.48 0.43 0.14  WBC 3.4*  --   --   --    Microbiology Recent Results (from the past 240 hour(s))  Resp Panel by RT-PCR (Flu A&B, Covid) Nasopharyngeal Swab     Status: None   Collection Time: 07/06/20  8:51 AM   Specimen: Nasopharyngeal Swab; Nasopharyngeal(NP) swabs in vial transport medium  Result Value Ref Range Status   SARS Coronavirus 2 by RT PCR NEGATIVE NEGATIVE Final    Comment: (NOTE) SARS-CoV-2 target nucleic acids are NOT DETECTED.  The SARS-CoV-2 RNA is generally detectable in upper respiratory specimens during the acute phase of infection. The lowest concentration of SARS-CoV-2 viral copies this assay can detect is 138 copies/mL. A negative result does not preclude SARS-Cov-2 infection and  should not be used as the sole basis for treatment or other patient management decisions. A negative result may occur with  improper specimen collection/handling, submission of specimen other than nasopharyngeal swab, presence of viral mutation(s) within the areas targeted by this assay, and inadequate number of viral copies(<138 copies/mL). A negative result must be combined with clinical observations, patient history, and epidemiological information. The expected result is Negative.  Fact Sheet for Patients:  EntrepreneurPulse.com.au  Fact Sheet for Healthcare Providers:  IncredibleEmployment.be  This test is no t yet approved or cleared by the [place] FDA and  has been authorized for detection and/or diagnosis of SARS-CoV-2 by FDA under an Emergency Use Authorization (EUA). This EUA will remain  in effect (meaning this test can be used) for the duration of the COVID-19 declaration under Section 564(b)(1) of the Act, 21 U.S.C.section 360bbb-3(b)(1), unless the authorization is terminated  or revoked sooner.       Influenza A by PCR NEGATIVE NEGATIVE Final   Influenza B by PCR NEGATIVE NEGATIVE Final    Comment: (NOTE) The  Xpert Xpress SARS-CoV-2/FLU/RSV plus assay is intended as an aid in the diagnosis of influenza from Nasopharyngeal swab specimens and should not be used as a sole basis for treatment. Nasal washings and aspirates are unacceptable for Xpert Xpress SARS-CoV-2/FLU/RSV testing.  Fact Sheet for Patients: EntrepreneurPulse.com.au  Fact Sheet for Healthcare Providers: IncredibleEmployment.be  This test is not yet approved or cleared by the [place] FDA and has been authorized for detection and/or diagnosis of SARS-CoV-2 by FDA under an Emergency Use Authorization (EUA). This EUA will remain in effect (meaning this test can be used) for the duration of the COVID-19 declaration under Section 564(b)(1) of the Act, 21 U.S.C. section 360bbb-3(b)(1), unless the authorization is terminated or revoked.  Performed at [facility], [place] [address], [place], [place] [address]   Culture, blood (routine x 2) Call MD if unable to obtain prior to antibiotics being given     Status: None   Collection Time: 07/07/20  9:06 AM   Specimen: Left Antecubital; Blood  Result Value Ref Range Status   Specimen Description LEFT ANTECUBITAL  Final   Special Requests   Final    BOTTLES DRAWN AEROBIC AND ANAEROBIC Blood Culture results may not be optimal due to an inadequate volume of blood received in culture bottles   Culture   Final    NO GROWTH 5 DAYS Performed at [place] [address], [place], [place] [address]    Report Status 07/12/2020 FINAL  Final  Culture, blood (routine x 2) Call MD if unable to obtain prior to antibiotics being given     Status: None   Collection Time: 07/07/20  9:09 AM   Specimen: BLOOD LEFT FOREARM  Result Value Ref Range Status   Specimen Description BLOOD LEFT FOREARM  Final   Special Requests   Final    BOTTLES DRAWN AEROBIC ONLY Blood Culture adequate volume   Culture   Final    NO GROWTH 5 DAYS Performed at [facility], [place] [address], [place], [place] [address]    Report Status 07/12/2020 FINAL  Final  Respiratory (~20 pathogens) panel by PCR     Status: None   Collection Time: 07/07/20 10:59 AM   Specimen: Nasopharyngeal Swab; Respiratory  Result Value Ref Range Status   Adenovirus NOT DETECTED NOT DETECTED Final   Coronavirus 229E NOT DETECTED NOT DETECTED Final    Comment: (  NOTE) The Coronavirus on the Respiratory Panel, DOES NOT test for the novel  Coronavirus (2019 nCoV)    Coronavirus HKU1 NOT DETECTED NOT DETECTED Final   Coronavirus NL63 NOT DETECTED NOT DETECTED Final   Coronavirus OC43 NOT DETECTED NOT DETECTED Final   Metapneumovirus NOT DETECTED NOT DETECTED Final   Rhinovirus / Enterovirus NOT DETECTED NOT DETECTED Final   Influenza A NOT DETECTED NOT DETECTED Final   Influenza B NOT DETECTED NOT DETECTED Final   Parainfluenza Virus 1 NOT DETECTED NOT DETECTED Final   Parainfluenza Virus 2 NOT DETECTED NOT DETECTED Final   Parainfluenza Virus 3 NOT DETECTED NOT DETECTED Final   Parainfluenza Virus 4 NOT DETECTED NOT DETECTED Final   Respiratory Syncytial Virus NOT DETECTED NOT DETECTED Final   Bordetella pertussis NOT DETECTED NOT DETECTED Final   Bordetella Parapertussis NOT DETECTED NOT DETECTED Final   Chlamydophila pneumoniae NOT DETECTED NOT DETECTED Final   Mycoplasma pneumoniae NOT DETECTED NOT DETECTED Final    Comment: Performed at [facility], [place] [address], [place], [place] [address]  Body fluid culture w Gram Stain     Status: None (Preliminary result)   Collection Time: 07/11/20 10:02 AM   Specimen: Synovium; Body Fluid  Result Value Ref Range Status   Specimen Description SYNOVIAL  Final   Special Requests KNEE  Final   Gram Stain   Final    NO WBC SEEN NO ORGANISMS SEEN Performed at [facility], [address] [place],  [address]    Culture PENDING  Incomplete   Report Status PENDING  Incomplete     Medications:   .  bupivacaine  10 mL Infiltration Once  . cyanocobalamin  1,000 mcg Subcutaneous Daily  . enoxaparin (LOVENOX) injection  30 mg Subcutaneous Q24H  . lidocaine  1 patch Transdermal Q24H  . lidocaine  1 patch Transdermal Q24H  . methylPREDNISolone acetate  40 mg Intra-articular Once  . naproxen  500 mg Oral BID WC  . pantoprazole  40 mg Oral BID  . rOPINIRole  0.25 mg Oral QPM  . sertraline  100 mg Oral Daily  . sodium chloride flush  3 mL Intravenous Q12H  . verapamil  240 mg Oral Daily   Continuous Infusions: . sodium chloride        LOS: 6 days   Charlynne Cousins  [facility]  07/12/2020, 10:27 AM

## 2020-07-12 NOTE — Progress Notes (Signed)
Daily Progress Note   Patient Name: Sophia [name]       Date: 07/12/2020 DOB: 1928-04-18  Age: 87 y.o. MRN#: [id number] Attending Physician: Charlynne Cousins, MD Primary Care Physician: [name], MD Admit Date: 07/06/2020  Reason for Consultation/Follow-up: Establishing goals of care  Subjective: Patient sitting up in bed, appears sad. Per son she had some coughing when eating this morning. GOC discussion continued- although Sophia [name] had previously stated his main goal was to take his mother home- he is now realizing he can't provide adequate care for her even with Hospice support.  He is also experiencing caregiver stress- he is missing his spouse and not being able to sleep in his own home.  We discussed options for SNF-rehab as patient seems to have some improved mobility after getting her knee effusion drained. We also discussed the likelihood that patient will eventually decline over time even if she does improve some with a rehab stay.  Sophia [name] has considered this- he feels that if he needed to he would want for Sophia [name] to reside long term in a SNF if she does not appear to get stronger with rehab.  Hospice vs Palliative services were discussed.  GOC if patient declined further were discussed- if patient declined to the point that she was readmitted to the hospital- Sophia [name] would wish only for her to be comfortable with no life-prolonging treatments. She has a MOST form on file indicating this.  Sophia [name] is accepting to outpatient Palliative referral for follow at [facility] and assist with transition to Hospice if needed.   Review of Systems  Unable to perform ROS: Mental acuity    Length of Stay: 6  Current Medications: Scheduled Meds:  . bupivacaine  10 mL Infiltration Once  .  cyanocobalamin  1,000 mcg Subcutaneous Daily  . enoxaparin (LOVENOX) injection  30 mg Subcutaneous Q24H  . lidocaine  1 patch Transdermal Q24H  . lidocaine  1 patch Transdermal Q24H  . methylPREDNISolone acetate  40 mg Intra-articular Once  . naproxen  500 mg Oral BID WC  . pantoprazole  40 mg Oral BID  . rOPINIRole  0.25 mg Oral QPM  . sertraline  100 mg Oral Daily  . sodium chloride flush  3 mL Intravenous Q12H  . verapamil  240 mg Oral Daily    Continuous  Infusions: . sodium chloride      PRN Meds: sodium chloride, acetaminophen, ALPRAZolam, gabapentin, ondansetron (ZOFRAN) IV, sodium chloride flush  Physical Exam Vitals and nursing note reviewed.  Constitutional:      Appearance: She is ill-appearing.  Cardiovascular:     Pulses: Normal pulses.  Pulmonary:     Effort: Pulmonary effort is normal.  Neurological:     Mental Status: She is alert.     Motor: Weakness present.             Vital Signs: BP (!) 112/59 (BP Location: Left Arm)   Pulse 75   Temp 98.4 F (36.9 C) (Oral)   Resp 18   Wt 86 kg   SpO2 98%   BMI 33.59 kg/m  SpO2: SpO2: 98 % O2 Device: O2 Device: Nasal Cannula O2 Flow Rate: O2 Flow Rate (L/min): 2 L/min  Intake/output summary:   Intake/Output Summary (Last 24 hours) at 07/12/2020 1225 Last data filed at 07/11/2020 1651 Gross per 24 hour  Intake --  Output 550 ml  Net -550 ml   LBM: Last BM Date: 07/12/20 Baseline Weight: Weight: 81.7 kg Most recent weight: Weight: 86 kg       Palliative Assessment/Data: PPS: 50%   Flowsheet Rows   Flowsheet Row Most Recent Value  Intake Tab   Referral Department Hospitalist  Unit at Time of Referral Cardiac/Telemetry Unit  Palliative Care Primary Diagnosis Cardiac  Date Notified 07/06/20  Palliative Care Type New Palliative care  Reason for referral Clarify Goals of Care  Date of Admission 07/06/20  Date first seen by Palliative Care 07/07/20  # of days Palliative referral response time 1  Day(s)  # of days IP prior to Palliative referral 0  Clinical Assessment   Psychosocial & Spiritual Assessment   Palliative Care Outcomes       Patient Active Problem List   Diagnosis Date Noted  . Acute on chronic systolic CHF (congestive heart failure) ([place]) 07/06/2020  . Elevated troponin 07/06/2020  . Preventive measure 10/27/2019  . Vitamin D deficiency 12/02/2018  . Respiratory failure, chronic ([place]) 08/05/2017  . Anemia due to chronic blood loss   . GIB (gastrointestinal bleeding) 02/03/2017  . CKD (chronic kidney disease), stage III ([place]) 02/03/2017  . Chronic combined systolic (congestive) and diastolic (congestive) heart failure ([place]) 02/03/2017  . Orthostatic hypotension 01/12/2016  . Iron deficiency anemia 10/31/2015  . Cataract cortical, senile 07/22/2014  . Pancytopenia ([place])   . Chronic leukopenia 09/07/2013  . Deficiency anemia 08/24/2013  . GAVE (gastric antral vascular ectasia) 04/28/2012  . Chronic gastritis 01/09/2011  . Aortic stenosis 01/09/2011  . Epigastric pain 07/29/2010  . Hyperlipidemia 08/05/2006  . Iron deficiency anemia due to chronic blood loss 08/05/2006  . ANXIETY DEPRESSION 08/05/2006  . NEUROPATHY, IDIOPATHIC PERIPHERAL NEC 08/05/2006  . Essential hypertension 08/05/2006  . GASTROESOPHAGEAL REFLUX DISEASE 08/05/2006  . Osteoarthritis 08/05/2006  . [place] DISEASE 08/05/2006  . OSTEOPOROSIS 08/05/2006  . SYMPTOM, INCONTINENCE, URINARY NOS 08/05/2006    Palliative Care Assessment & Plan   Patient Profile: 87 y.o.femalewith past medical history of EF of 40%,moderate aortic stenosis,chronic respiratory failure on 2 L of oxygen, OA, and anxietyadmitted on 6/1/2022with shortness of breath.Chest x-ray showed left midlung interstitial thickening. Labs revealedBNP of 800. Initially treated for HF exacerbation. Now being treated for infectious process. PMT consulted for [place].  Assessment/Recommendations/Plan   Continue  current symptom management  D/C to SNF rehab when stable  Refer for outpatient Palliative  Comfort  measures only/hospice if patient decompensates at rehab or home  Goals of Care and Additional Recommendations:  Limitations on Scope of Treatment: Avoid Hospitalization  Code Status:  DNR  Prognosis:   < 12 months  Discharge Planning:  [place] for rehab with Palliative care service follow-up  Care plan was discussed with patient's son, HCPOA- Sophia [name]  Thank you for allowing the Palliative Medicine Team to assist in the care of this patient.   Total time: 68 mins Greater than 50%  of this time was spent counseling and coordinating care related to the above assessment and plan.  Mariana Kaufman, AGNP-C Palliative Medicine   Please contact Palliative Medicine Team phone at [phone] for questions and concerns.

## 2020-07-13 DIAGNOSIS — M25461 Effusion, right knee: Secondary | ICD-10-CM

## 2020-07-13 MED ORDER — VITAMIN B-12 1000 MCG PO TABS
1000.0000 ug | ORAL_TABLET | Freq: Every day | ORAL | Status: DC
  Administered 2020-07-14: 1000 ug via ORAL
  Filled 2020-07-13: qty 1

## 2020-07-13 NOTE — Progress Notes (Signed)
Occupational Therapy Treatment Patient Details Name: Sophia [name] MRN: [id number] DOB: April 03, 1928 Today's Date: 07/13/2020    History of present illness Pt is a 87 y.o. female admitted 07/06/20 with SOB; of note, recent fall OOB last week with c/o L shoulder pain. Workup for acute on chornic hypoxic respiratory failure likely due to CAP, AKI. L shoulder and pelvic imaging negative for acute injury. Pt with chronic R knee pain; imaging showed large effusion. S/p R knee aspiration and injection on 6/6. PMH includes CHF, chronic resp failure (2L O2 baseline), OA, cataracts, neuropathy, anxiety.   OT comments  Patient supine in bed and agreeable to OT session. Patient currently requires mod assist for transfers from EOB to recliner, max assist for LB dressing.   Increased time and encouragement required, continues to be highly fearful of falling and anxious throughout session. Will follow acutely.    Follow Up Recommendations  SNF;Supervision/Assistance - 24 hour    Equipment Recommendations  Other (comment) (TBD)    Recommendations for Other Services      Precautions / Restrictions Precautions Precautions: Fall;Other (comment) Precaution Comments: Urine incontinence; 2L O2 baseline; fearful of falling, prefers having shoes on (in room) Restrictions Weight Bearing Restrictions: No       Mobility Bed Mobility Overal bed mobility: Needs Assistance Bed Mobility: Supine to Sit Rolling: Min assist         General bed mobility comments: min assist with incrased time to scoot hips forward, cueing for techniquesa nd hand placement    Transfers Overall transfer level: Needs assistance Equipment used: Rolling walker (2 wheeled) Transfers: Sit to/from Stand Sit to Stand: Mod assist         General transfer comment: mod assist to power up and steady from EOB, cueing required for hand placement, forward lean, readjustment of B feet    Balance Overall balance assessment: Needs  assistance Sitting-balance support: No upper extremity supported;Feet supported Sitting balance-Leahy Scale: Fair     Standing balance support: Bilateral upper extremity supported;During functional activity Standing balance-Leahy Scale: Poor Standing balance comment: relies on BUE and external support                           ADL either performed or assessed with clinical judgement   ADL Overall ADL's : Needs assistance/impaired                     Lower Body Dressing: Maximal assistance;Sitting/lateral leans;Sit to/from stand Lower Body Dressing Details (indicate cue type and reason): requires assist to don shoes over heels, mod assist sit to stand and relies on BUE support in standing Toilet Transfer: Moderate assistance;Stand-pivot Toilet Transfer Details (indicate cue type and reason): simulated to recliner using RW         Functional mobility during ADLs: Moderate assistance;Rolling walker;Cueing for sequencing General ADL Comments: pt limited by weakness, balance, decreased activity tolerance, pain     Vision   Vision Assessment?: No apparent visual deficits   Perception     Praxis      Cognition Arousal/Alertness: Awake/alert Behavior During Therapy: Anxious Overall Cognitive Status: Impaired/Different from baseline Area of Impairment: Attention;Memory;Following commands;Awareness;Problem solving                   Current Attention Level: Selective Memory: Decreased short-term memory Following Commands: Follows one step commands consistently;Follows one step commands with increased time   Awareness: Emergent Problem Solving: Requires verbal cues General Comments: patient  requires increased time and cueing for problem solving, although anxious about mobility as well.        Exercises     Shoulder Instructions       General Comments pt on 2L during session    Pertinent Vitals/ Pain       Pain Assessment: Faces Faces Pain  Scale: Hurts a little bit Pain Location: Generalized Pain Descriptors / Indicators: Discomfort Pain Intervention(s): Limited activity within patient's tolerance;Monitored during session;Repositioned  Home Living                                          Prior Functioning/Environment              Frequency  Min 2X/week        Progress Toward Goals  OT Goals(current goals can now be found in the care plan section)  Progress towards OT goals: Progressing toward goals  Acute Rehab OT Goals Patient Stated Goal: post-acute rehab at SNF to regain strength OT Goal Formulation: With patient  Plan Discharge plan remains appropriate;Frequency remains appropriate    Co-evaluation                 AM-PAC OT "6 Clicks" Daily Activity     Outcome Measure   Help from another person eating meals?: A Little Help from another person taking care of personal grooming?: A Little Help from another person toileting, which includes using toliet, bedpan, or urinal?: A Lot Help from another person bathing (including washing, rinsing, drying)?: A Lot Help from another person to put on and taking off regular upper body clothing?: A Little Help from another person to put on and taking off regular lower body clothing?: A Lot 6 Click Score: 15    End of Session Equipment Utilized During Treatment: Rolling walker;Oxygen  OT Visit Diagnosis: Other abnormalities of gait and mobility (R26.89);Muscle weakness (generalized) (M62.81);Pain;History of falling (Z91.81) Pain - part of body:  (genearlized)   Activity Tolerance Patient tolerated treatment well   Patient Left in chair;with call bell/phone within reach;with chair alarm set;with nursing/sitter in room   Nurse Communication Mobility status        Time: 3295-1884 OT Time Calculation (min): 18 min  Charges: OT General Charges $OT Visit: 1 Visit OT Treatments $Self Care/Home Management : 8-22 mins  Jolaine Artist,  OT [place] Pager [phone] Office [place] 07/13/2020, 9:45 AM

## 2020-07-13 NOTE — TOC Progression Note (Addendum)
Transition of Care [facility]) - Progression Note    Patient Details  Name: Sophia [name] MRN: [id number] Date of Birth: 10-02-1928  Transition of Care [facility]) CM/SW [place], [place] Phone Number: 07/13/2020, 12:36 PM  Clinical Narrative:     Update- CSW received call from [facility] who confirmed they can accept patient for SNF placement. Patient has SN[facility] bed at [facility] with palliative services to follow. CSW let Olivia Mackie with [facility] know that patient will need outpatient palliative at [facility].CSW will continue to follow and assist with discharge planning needs.  CSW spoke with patients son [name] and provided SNF bed offers. Patients son requested for CSW ton check and see if [facility] or [facility] can accept patient for SNF placement. CSW called and spoke with Olivia Mackie with [facility] who confirmed with CSW that they are reviewing patients referral and will let CSW know shortly if they can accept patient for SNF placement. CSW informed patients son. CSW awaiting callback from Ina with [facility].CSW will continue to follow and assist with discharge planning needs.    Expected Discharge Plan:  (TBD) Barriers to Discharge: Continued Medical Work up  Expected Discharge Plan and Services Expected Discharge Plan:  (TBD)   Discharge Planning Services: CM Consult                                           Social Determinants of Health (SDOH) Interventions    Readmission Risk Interventions No flowsheet data found.

## 2020-07-13 NOTE — Plan of Care (Signed)
  Problem: Education: Goal: Ability to demonstrate management of disease process will improve Outcome: Progressing Goal: Ability to verbalize understanding of medication therapies will improve Outcome: Progressing   

## 2020-07-13 NOTE — Progress Notes (Signed)
[facility]  PROGRESS NOTE  Sophia [name] ZDG:[id number] DOB: 04-01-1928 DOA: 07/06/2020 PCP: [name], MD   Brief HPI:   87 year old female with history of moderate aortic stenosis, chronic respiratory failure on 2 L of oxygen 20 to hospital with worsening of shortness of breath.  Chest x-ray showed left midlung interstitial thickening.  BNP was elevated at 800.    Subjective   Patient seen and examined, denies any complaints.  No chest pain or shortness of breath.   Assessment/Plan:     Acute on chronic hypoxemic respiratory failure -Resolved -Secondary to community-acquired pneumonia -Completed 5 days of antibiotic treatment in the hospital -Blood cultures are negative to date -Requiring 3 L/min of oxygen   Right knee pain -Significantly improved -Secondary to osteoarthritis -X-ray of right knee showed large effusion -Orthopedic surgery was consulted, she underwent arthrocentesis that showed 20,000 WBC mainly PMN, no crystals, gram stain negative.  Joint fluid culture is negative to date. -Patient was given naproxen 500 mg p.o. twice daily for 4 doses   Vitamin B12 deficiency -Vitamin B12: 142 -Was given vitamin B12 1000 mcg subcu daily for 4 days -Start vitamin B12 1000 mcg p.o. daily -Need to recheck B12 level in 6 to 8 weeks as outpatient   Hypertension -Blood pressure is stable -Continue verapamil   Acute kidney injury on CKD stage IIIa -Likely from overdiuresis -Creatinine is 1.13 today   Mild elevation of troponin -Troponin 39, 35 -No chest pain -Likely demand ischemia   Scheduled medications:   . bupivacaine  10 mL Infiltration Once  . dexlansoprazole  60 mg Oral Daily  . enoxaparin (LOVENOX) injection  30 mg Subcutaneous Q24H  . lidocaine  1 patch Transdermal Q24H  . lidocaine  1 patch Transdermal Q24H  . methylPREDNISolone acetate  40 mg Intra-articular Once  . rOPINIRole  0.25 mg Oral QPM  . sertraline  100 mg Oral Daily  .  sodium chloride flush  3 mL Intravenous Q12H  . verapamil  240 mg Oral Daily         Data Reviewed:   CBG:  No results for input(s): GLUCAP in the last 168 hours.  SpO2: 99 % O2 Flow Rate (L/min): 3 L/min    Vitals:   07/12/20 1417 07/12/20 2011 07/13/20 0552 07/13/20 1112  BP: (!) 86/54 (!) 123/57 137/78 130/75  Pulse: 62 73 78 100  Resp: 17 18 16 18   Temp: 98 F (36.7 C) 98.2 F (36.8 C) (!) 97.5 F (36.4 C) (!) 97.4 F (36.3 C)  TempSrc: Oral Oral Oral Oral  SpO2: 96% 98% 100% 99%  Weight:   86.5 kg      Intake/Output Summary (Last 24 hours) at 07/13/2020 1756 Last data filed at 07/13/2020 1357 Gross per 24 hour  Intake 477 ml  Output 450 ml  Net 27 ml    06/06 1901 - 06/08 0700 In: 597 [P.O.:597] Out: 150 [Urine:150]  Filed Weights   07/11/20 0225 07/12/20 0500 07/13/20 0552  Weight: 87.1 kg 86 kg 86.5 kg    CBC:  No results for input(s): WBC, HGB, HCT, PLT, MCV, MCH, MCHC, RDW, LYMPHSABS, MONOABS, EOSABS, BASOSABS, BANDABS in the last 168 hours.  Invalid input(s): NEUTRABS, BANDSABD  Complete metabolic panel:  Recent Labs  Lab 07/07/20 0405 07/07/20 0909 07/08/20 0415 07/09/20 0141 07/10/20 0419  NA 134*  --  134* 137 135  K 3.6  --  3.6 3.6 3.8  CL 96*  --  94* 103 104  CO2  27  --  28 24 24   GLUCOSE 105*  --  110* 98 111*  BUN 21  --  35* 27* 24*  CREATININE 1.29*  --  2.09* 1.31* 1.13*  CALCIUM 8.7*  --  8.4* 7.6* 7.5*  PROCALCITON  --  0.48 0.43 0.14  --     No results for input(s): LIPASE, AMYLASE in the last 168 hours.  Recent Labs  Lab 07/07/20 0909 07/08/20 0415 07/09/20 0141 07/12/20 1109  PROCALCITON 0.48 0.43 0.14  --   SARSCOV2NAA  --   --   --  NEGATIVE    ------------------------------------------------------------------------------------------------------------------ No results for input(s): CHOL, HDL, LDLCALC, TRIG, CHOLHDL, LDLDIRECT in the last 72 hours.  No results found for:  HGBA1C ------------------------------------------------------------------------------------------------------------------ No results for input(s): TSH, T4TOTAL, T3FREE, THYROIDAB in the last 72 hours.  Invalid input(s): FREET3 ------------------------------------------------------------------------------------------------------------------ No results for input(s): VITAMINB12, FOLATE, FERRITIN, TIBC, IRON, RETICCTPCT in the last 72 hours.  Coagulation profile No results for input(s): INR, PROTIME in the last 168 hours. No results for input(s): DDIMER in the last 72 hours.  Cardiac Enzymes No results for input(s): CKTOTAL, CKMB, CKMBINDEX, TROPONINI in the last 168 hours.  ------------------------------------------------------------------------------------------------------------------    Component Value Date/Time   BNP 837.8 (H) 07/06/2020 0825     Antibiotics: Anti-infectives (From admission, onward)   Start     Dose/Rate Route Frequency Ordered Stop   07/10/20 1000  amoxicillin-clavulanate (AUGMENTIN) 875-125 MG per tablet 1 tablet        1 tablet Oral Every 12 hours 07/10/20 0843 07/11/20 2115   07/10/20 1000  azithromycin (ZITHROMAX) tablet 500 mg        500 mg Oral Daily 07/10/20 0843 07/11/20 1027   07/09/20 1015  azithromycin (ZITHROMAX) tablet 500 mg  Status:  Discontinued        500 mg Oral Daily 07/09/20 0929 07/10/20 0843   07/09/20 1015  amoxicillin-clavulanate (AUGMENTIN) 875-125 MG per tablet 1 tablet  Status:  Discontinued        1 tablet Oral Every 12 hours 07/09/20 0929 07/10/20 0843   07/07/20 0915  cefTRIAXone (ROCEPHIN) 2 g in sodium chloride 0.9 % 100 mL IVPB  Status:  Discontinued        2 g 200 mL/hr over 30 Minutes Intravenous Every 24 hours 07/07/20 0822 07/09/20 0929   07/07/20 0915  azithromycin (ZITHROMAX) 500 mg in sodium chloride 0.9 % 250 mL IVPB  Status:  Discontinued        500 mg 250 mL/hr over 60 Minutes Intravenous Every 24 hours 07/07/20 9628  07/09/20 0929       Radiology Reports  No results found.    DVT prophylaxis: Lovenox  Code Status: DNR  Family Communication: No family at bedside   Consultants:    Procedures:      Objective    Physical Examination:    General-appears in no acute distress  Heart-S1-S2, regular, no murmur auscultated  Lungs-clear to auscultation bilaterally, no wheezing or crackles auscultated  Abdomen-soft, nontender, no organomegaly  Extremities-no edema in the lower extremities  Neuro-alert, oriented x3, no focal deficit noted   Status is: Inpatient  Dispo: The patient is from: Skilled nursing facility              Anticipated d/c is to: Skilled nursing facility              Anticipated d/c date is: 07/14/2020              Patient currently  stable for discharge  Barrier to discharge-awaiting bed at skilled nursing facility  COVID-19 Labs  No results for input(s): DDIMER, FERRITIN, LDH, CRP in the last 72 hours.  Lab Results  Component Value Date   [place] NEGATIVE 07/12/2020   [place] NEGATIVE 07/06/2020    Microbiology  Recent Results (from the past 240 hour(s))  Resp Panel by RT-PCR (Flu A&B, Covid) Nasopharyngeal Swab     Status: None   Collection Time: 07/06/20  8:51 AM   Specimen: Nasopharyngeal Swab; Nasopharyngeal(NP) swabs in vial transport medium  Result Value Ref Range Status   SARS Coronavirus 2 by RT PCR NEGATIVE NEGATIVE Final    Comment: (NOTE) SARS-CoV-2 target nucleic acids are NOT DETECTED.  The SARS-CoV-2 RNA is generally detectable in upper respiratory specimens during the acute phase of infection. The lowest concentration of SARS-CoV-2 viral copies this assay can detect is 138 copies/mL. A negative result does not preclude SARS-Cov-2 infection and should not be used as the sole basis for treatment or other patient management decisions. A negative result may occur with  improper specimen collection/handling, submission of  specimen other than nasopharyngeal swab, presence of viral mutation(s) within the areas targeted by this assay, and inadequate number of viral copies(<138 copies/mL). A negative result must be combined with clinical observations, patient history, and epidemiological information. The expected result is Negative.  Fact Sheet for Patients:  EntrepreneurPulse.com.au  Fact Sheet for Healthcare Providers:  IncredibleEmployment.be  This test is no t yet approved or cleared by the [place] FDA and  has been authorized for detection and/or diagnosis of SARS-CoV-2 by FDA under an Emergency Use Authorization (EUA). This EUA will remain  in effect (meaning this test can be used) for the duration of the COVID-19 declaration under Section 564(b)(1) of the Act, 21 U.S.C.section 360bbb-3(b)(1), unless the authorization is terminated  or revoked sooner.       Influenza A by PCR NEGATIVE NEGATIVE Final   Influenza B by PCR NEGATIVE NEGATIVE Final    Comment: (NOTE) The Xpert Xpress SARS-CoV-2/FLU/RSV plus assay is intended as an aid in the diagnosis of influenza from Nasopharyngeal swab specimens and should not be used as a sole basis for treatment. Nasal washings and aspirates are unacceptable for Xpert Xpress SARS-CoV-2/FLU/RSV testing.  Fact Sheet for Patients: EntrepreneurPulse.com.au  Fact Sheet for Healthcare Providers: IncredibleEmployment.be  This test is not yet approved or cleared by the [place] FDA and has been authorized for detection and/or diagnosis of SARS-CoV-2 by FDA under an Emergency Use Authorization (EUA). This EUA will remain in effect (meaning this test can be used) for the duration of the COVID-19 declaration under Section 564(b)(1) of the Act, 21 U.S.C. section 360bbb-3(b)(1), unless the authorization is terminated or revoked.  Performed at [facility], [place] [address]  [place], [place] [address]   Culture, blood (routine x 2) Call MD if unable to obtain prior to antibiotics being given     Status: None   Collection Time: 07/07/20  9:06 AM   Specimen: Left Antecubital; Blood  Result Value Ref Range Status   Specimen Description LEFT ANTECUBITAL  Final   Special Requests   Final    BOTTLES DRAWN AEROBIC AND ANAEROBIC Blood Culture results may not be optimal due to an inadequate volume of blood received in culture bottles   Culture   Final    NO GROWTH 5 DAYS Performed at [facility] [address] [place], [place] [address]    Report Status  07/12/2020 FINAL  Final  Culture, blood (routine x 2) Call MD if unable to obtain prior to antibiotics being given     Status: None   Collection Time: 07/07/20  9:09 AM   Specimen: BLOOD LEFT FOREARM  Result Value Ref Range Status   Specimen Description BLOOD LEFT FOREARM  Final   Special Requests   Final    BOTTLES DRAWN AEROBIC ONLY Blood Culture adequate volume   Culture   Final    NO GROWTH 5 DAYS Performed at [facility], [address] [place], [place] [address]    Report Status 07/12/2020 FINAL  Final  Respiratory (~20 pathogens) panel by PCR     Status: None   Collection Time: 07/07/20 10:59 AM   Specimen: Nasopharyngeal Swab; Respiratory  Result Value Ref Range Status   Adenovirus NOT DETECTED NOT DETECTED Final   Coronavirus 229E NOT DETECTED NOT DETECTED Final    Comment: (NOTE) The Coronavirus on the Respiratory Panel, DOES NOT test for the novel  Coronavirus (2019 nCoV)    Coronavirus HKU1 NOT DETECTED NOT DETECTED Final   Coronavirus NL63 NOT DETECTED NOT DETECTED Final   Coronavirus OC43 NOT DETECTED NOT DETECTED Final   Metapneumovirus NOT DETECTED NOT DETECTED Final   Rhinovirus / Enterovirus NOT DETECTED NOT DETECTED Final   Influenza A NOT DETECTED NOT DETECTED Final   Influenza B NOT DETECTED NOT DETECTED Final   Parainfluenza Virus 1 NOT DETECTED NOT DETECTED Final    Parainfluenza Virus 2 NOT DETECTED NOT DETECTED Final   Parainfluenza Virus 3 NOT DETECTED NOT DETECTED Final   Parainfluenza Virus 4 NOT DETECTED NOT DETECTED Final   Respiratory Syncytial Virus NOT DETECTED NOT DETECTED Final   Bordetella pertussis NOT DETECTED NOT DETECTED Final   Bordetella Parapertussis NOT DETECTED NOT DETECTED Final   Chlamydophila pneumoniae NOT DETECTED NOT DETECTED Final   Mycoplasma pneumoniae NOT DETECTED NOT DETECTED Final    Comment: Performed at [facility], [place] [address], [place], [place] [address]  Body fluid culture w Gram Stain     Status: None (Preliminary result)   Collection Time: 07/11/20 10:02 AM   Specimen: Synovium; Body Fluid  Result Value Ref Range Status   Specimen Description SYNOVIAL  Final   Special Requests KNEE  Final   Gram Stain NO WBC SEEN NO ORGANISMS SEEN   Final   Culture   Final    NO GROWTH 2 DAYS Performed at [facility], [address], [place], [place] [address]    Report Status PENDING  Incomplete  SARS CORONAVIRUS 2 (TAT 6-24 HRS) Nasopharyngeal Nasopharyngeal Swab     Status: None   Collection Time: 07/12/20 11:09 AM   Specimen: Nasopharyngeal Swab  Result Value Ref Range Status   SARS Coronavirus 2 NEGATIVE NEGATIVE Final    Comment: (NOTE) SARS-CoV-2 target nucleic acids are NOT DETECTED.  The SARS-CoV-2 RNA is generally detectable in upper and lower respiratory specimens during the acute phase of infection. Negative results do not preclude SARS-CoV-2 infection, do not rule out co-infections with other pathogens, and should not be used as the sole basis for treatment or other patient management decisions. Negative results must be combined with clinical observations, patient history, and epidemiological information. The expected result is Negative.  Fact Sheet for Patients: SugarRoll.be  Fact Sheet for Healthcare  Providers: https://www.woods-mathews.com/  This test is not yet approved or cleared by the [place] FDA and  has been authorized for detection and/or  diagnosis of SARS-CoV-2 by FDA under an Emergency Use Authorization (EUA). This EUA will remain  in effect (meaning this test can be used) for the duration of the COVID-19 declaration under Se ction 564(b)(1) of the Act, 21 U.S.C. section 360bbb-3(b)(1), unless the authorization is terminated or revoked sooner.  Performed at [facility], [place] [address] [place], [place] [address]              [facility] If 7PM-7AM, please contact night-coverage at [email or web address], Office  [phone]   07/13/2020, 5:56 PM  LOS: 7 days

## 2020-07-14 DIAGNOSIS — N1831 Chronic kidney disease, stage 3a: Secondary | ICD-10-CM | POA: Diagnosis not present

## 2020-07-14 DIAGNOSIS — M25561 Pain in right knee: Secondary | ICD-10-CM | POA: Diagnosis not present

## 2020-07-14 DIAGNOSIS — J9621 Acute and chronic respiratory failure with hypoxia: Secondary | ICD-10-CM | POA: Diagnosis not present

## 2020-07-14 DIAGNOSIS — Z515 Encounter for palliative care: Secondary | ICD-10-CM | POA: Diagnosis not present

## 2020-07-14 DIAGNOSIS — Z743 Need for continuous supervision: Secondary | ICD-10-CM | POA: Diagnosis not present

## 2020-07-14 DIAGNOSIS — F339 Major depressive disorder, recurrent, unspecified: Secondary | ICD-10-CM | POA: Diagnosis not present

## 2020-07-14 DIAGNOSIS — D519 Vitamin B12 deficiency anemia, unspecified: Secondary | ICD-10-CM | POA: Diagnosis not present

## 2020-07-14 DIAGNOSIS — R5381 Other malaise: Secondary | ICD-10-CM | POA: Diagnosis not present

## 2020-07-14 DIAGNOSIS — I959 Hypotension, unspecified: Secondary | ICD-10-CM | POA: Diagnosis not present

## 2020-07-14 DIAGNOSIS — J9611 Chronic respiratory failure with hypoxia: Secondary | ICD-10-CM | POA: Diagnosis not present

## 2020-07-14 DIAGNOSIS — N184 Chronic kidney disease, stage 4 (severe): Secondary | ICD-10-CM | POA: Diagnosis not present

## 2020-07-14 DIAGNOSIS — R2689 Other abnormalities of gait and mobility: Secondary | ICD-10-CM | POA: Diagnosis not present

## 2020-07-14 DIAGNOSIS — M2550 Pain in unspecified joint: Secondary | ICD-10-CM | POA: Diagnosis not present

## 2020-07-14 DIAGNOSIS — N183 Chronic kidney disease, stage 3 unspecified: Secondary | ICD-10-CM | POA: Diagnosis not present

## 2020-07-14 DIAGNOSIS — R58 Hemorrhage, not elsewhere classified: Secondary | ICD-10-CM | POA: Diagnosis not present

## 2020-07-14 DIAGNOSIS — M1711 Unilateral primary osteoarthritis, right knee: Secondary | ICD-10-CM | POA: Diagnosis not present

## 2020-07-14 DIAGNOSIS — F419 Anxiety disorder, unspecified: Secondary | ICD-10-CM | POA: Diagnosis not present

## 2020-07-14 DIAGNOSIS — Z7189 Other specified counseling: Secondary | ICD-10-CM | POA: Diagnosis not present

## 2020-07-14 DIAGNOSIS — I10 Essential (primary) hypertension: Secondary | ICD-10-CM | POA: Diagnosis not present

## 2020-07-14 DIAGNOSIS — I509 Heart failure, unspecified: Secondary | ICD-10-CM | POA: Diagnosis not present

## 2020-07-14 DIAGNOSIS — M25461 Effusion, right knee: Secondary | ICD-10-CM | POA: Diagnosis not present

## 2020-07-14 DIAGNOSIS — I5023 Acute on chronic systolic (congestive) heart failure: Secondary | ICD-10-CM | POA: Diagnosis not present

## 2020-07-14 DIAGNOSIS — R410 Disorientation, unspecified: Secondary | ICD-10-CM | POA: Diagnosis not present

## 2020-07-14 DIAGNOSIS — M810 Age-related osteoporosis without current pathological fracture: Secondary | ICD-10-CM | POA: Diagnosis not present

## 2020-07-14 DIAGNOSIS — M6281 Muscle weakness (generalized): Secondary | ICD-10-CM | POA: Diagnosis not present

## 2020-07-14 DIAGNOSIS — K219 Gastro-esophageal reflux disease without esophagitis: Secondary | ICD-10-CM | POA: Diagnosis not present

## 2020-07-14 DIAGNOSIS — I119 Hypertensive heart disease without heart failure: Secondary | ICD-10-CM | POA: Diagnosis not present

## 2020-07-14 LAB — BODY FLUID CULTURE W GRAM STAIN
Culture: NO GROWTH
Gram Stain: NONE SEEN

## 2020-07-14 MED ORDER — ALPRAZOLAM 0.25 MG PO TABS: 0.2500 mg | ORAL_TABLET | Freq: Three times a day (TID) | ORAL | 0 refills | Status: AC | PRN

## 2020-07-14 MED ORDER — DEXLANSOPRAZOLE 60 MG PO CPDR: 60.0000 mg | DELAYED_RELEASE_CAPSULE | Freq: Every day | ORAL | Status: AC

## 2020-07-14 MED ORDER — ROPINIROLE HCL 0.5 MG PO TABS
0.5000 mg | ORAL_TABLET | Freq: Every evening | ORAL | Status: DC
  Filled 2020-07-14: qty 1

## 2020-07-14 MED ORDER — ROPINIROLE HCL 0.5 MG PO TABS: 0.5000 mg | ORAL_TABLET | Freq: Every evening | ORAL | Status: AC

## 2020-07-14 MED ORDER — CYANOCOBALAMIN 1000 MCG PO TABS: 1000.0000 ug | ORAL_TABLET | Freq: Every day | ORAL | Status: AC

## 2020-07-14 MED ORDER — GABAPENTIN 400 MG PO CAPS: 800.0000 mg | ORAL_CAPSULE | Freq: Two times a day (BID) | ORAL | Status: AC | PRN

## 2020-07-14 NOTE — Progress Notes (Signed)
Report called to Ms Juliann Pulse RN [facility].

## 2020-07-14 NOTE — Progress Notes (Signed)
Manufacturing engineer [facility])  Hospital Liaison: RN note         This patient has been referred to our palliative care services in the community at [place].   [facility] will continue to follow for any discharge planning needs and to coordinate continuation of palliative care in the outpatient setting.    If you have questions or need assistance, please call [phone] or contact the hospital Liaison listed on AMION.      Thank you for this referral.         Farrel Gordon, RN, [facility] Liaison   [phone]

## 2020-07-14 NOTE — Progress Notes (Signed)
Nsg Discharge Note  Admit Date:  07/06/2020 Discharge date: 07/14/2020   Sophia [name] to be D/C'd Skilled nursing facility per MD order.  AVS completed.  CPatient/caregiver able to verbalize understanding.  Discharge Medication: Allergies as of 07/14/2020       Reactions   Nutritional Supplements Anaphylaxis   [name] Shortness Of Breath   [name] [alendronate Sodium] Other (See Comments)   Reaction:  GI upset    Lipitor [atorvastatin] Other (See Comments)   Reaction:  Muscle aches    Pregabalin Nausea And Vomiting   [name] Nausea And Vomiting   Wasp Venom Hives        Medication List     TAKE these medications    acetaminophen 500 MG tablet Commonly known as: TYLENOL Take 1,000 mg by mouth every 6 (six) hours as needed for mild pain or fever.   ALPRAZolam 0.25 MG tablet Commonly known as: XANAX Take 1 tablet (0.25 mg total) by mouth 3 (three) times daily as needed for up to 5 doses for anxiety or sleep. What changed:  when to take this reasons to take this   cyanocobalamin 1000 MCG tablet Take 1 tablet (1,000 mcg total) by mouth daily. Start taking on: July 15, 2020   dexlansoprazole 60 MG capsule Commonly known as: Dexilant Take 1 capsule (60 mg total) by mouth daily. What changed: See the new instructions.   EPINEPHrine 0.3 mg/0.3 mL Soaj injection Commonly known as: EPI-PEN Inject 0.3 mg into the muscle once as needed (for severe allergic reaction).   gabapentin 400 MG capsule Commonly known as: NEURONTIN Take 2 capsules (800 mg total) by mouth 2 (two) times daily as needed (nerve pain).   hydroxypropyl methylcellulose / hypromellose 2.5 % ophthalmic solution Commonly known as: ISOPTO TEARS / GONIOVISC Place 1-2 drops into both eyes as needed for dry eyes.   OXYGEN Inhale 2 L into the lungs continuous.   Prolia 60 MG/ML Sosy injection Generic drug: denosumab Inject 60 mg into the skin every 6 (six) months.   rOPINIRole  0.5 MG tablet Commonly known as: REQUIP Take 1 tablet (0.5 mg total) by mouth every evening.   sertraline 100 MG tablet Commonly known as: ZOLOFT Take 100 mg by mouth daily.   verapamil 240 MG CR tablet Commonly known as: CALAN-SR Take 240 mg by mouth daily.               Durable Medical Equipment  (From admission, onward)           Start     Ordered   07/09/20 0932  For home use only DME Hospital bed  Once       Question Answer Comment  Length of Need 12 Months   The above medical condition requires: Patient requires the ability to reposition frequently   Head must be elevated greater than: 45 degrees   Bed type Semi-electric      07/09/20 0932            Discharge Assessment: Vitals:   07/14/20 0451 07/14/20 1146  BP: 139/84 (!) 105/57  Pulse: 82 80  Resp: 16 18  Temp: (!) 97.4 F (36.3 C) 97.7 F (36.5 C)  SpO2: 100% 100%   Skin clean, dry and intact without evidence of skin break down, no evidence of skin tears noted. IV catheter discontinued intact. Site without signs and symptoms of complications - no redness or edema noted at insertion site, patient denies c/o pain - only slight tenderness  at site.  Dressing with slight pressure applied.  D/c Instructions-Education: Discharge instructions given to patient/family with verbalized understanding. D/c education completed with patient/family including follow up instructions, medication list, d/c activities limitations if indicated, with other d/c instructions as indicated by MD - patient able to verbalize understanding, all questions fully answered. Patient instructed to return to ED, call 911, or call MD for any changes in condition.  Patient transported via [facility], Jolene Schimke, RN 07/14/2020 2:31 PM

## 2020-07-14 NOTE — Progress Notes (Signed)
This chaplain is present with the Pt. For F/U spiritual care. The Pt. is sitting up in bed and greeted the chaplain with a smile.  The Pt. is able to articulate her agreement with the d/c plans to a SNF, even though home is her place of comfort. The Pt. son-[name] joined the Pt. later in the visit.    The chaplain practiced reflective listening during the span of three generations of story telling.  The Pt. experiences as a child clearly informs how she values her roles as a wife, mother and grandmother.  The Pt. appreciated the chaplain's willingness to listen and is hopeful other people will have the time to listen.

## 2020-07-14 NOTE — TOC Progression Note (Signed)
Transition of Care [facility] - Progression Note    Patient Details  Name: Sophia [name] MRN: [id number] Date of Birth: 1928-09-09  Transition of Care [facility] CM/SW [place], [place] Phone Number: 07/14/2020, 12:27 PM  Clinical Narrative:     CSW received consult for palliative services to follow patient at SNF. CSW called patients son [name] who agreed for CSW to make referral to [facility]. CSW called Audrea Muscat with [facility] and made referral for outpatient palliative services to follow patient at SNF. CSW will continue to follow and assist with discharge planning needs.  Expected Discharge Plan:  (TBD) Barriers to Discharge: Continued Medical Work up  Expected Discharge Plan and Services Expected Discharge Plan:  (TBD)   Discharge Planning Services: CM Consult     Expected Discharge Date: 07/14/20                                     Social Determinants of Health (SDOH) Interventions    Readmission Risk Interventions No flowsheet data found.

## 2020-07-14 NOTE — Care Management Important Message (Signed)
Important Message  Patient Details  Name: ELLERIE ARENZ MRN: [id number] Date of Birth: 10-12-1928   Medicare Important Message Given:  Yes     [name] 07/14/2020, 12:01 PM

## 2020-07-14 NOTE — TOC Transition Note (Signed)
Transition of Care [facility]) - CM/SW Discharge Note   Patient Details  Name: Sophia [name] MRN: [id number] Date of Birth: 1928-10-17  Transition of Care [facility]) CM/SW Contact:  Sophia [name], [place] Phone Number: 07/14/2020, 12:50 PM   Clinical Narrative:     Patient will DC to: [facility]  Anticipated DC date: 07/14/2020  Family notified: Sophia [name]  Transport by: Sophia [name]  ?  Per MD patient ready for DC to [facility] with palliative services to follow . RN, patient, patient's family, Velta Addison with [facility],and facility notified of DC. Discharge Summary sent to facility. RN given number for report tele#[phone] EX:[phone]. DC packet on chart.DNR signed by MD attached to dc packet.Ambulance transport requested for patient.  CSW signing off.    Final next level of care: Skilled Nursing Facility Barriers to Discharge: No Barriers Identified   Patient Goals and CMS Choice   CMS Medicare.gov Compare Post Acute Care list provided to:: Patient Represenative (must comment) ([name]) Choice offered to / list presented to : Adult Children ([name])  Discharge Placement              Patient chooses bed at: [place], [place] Patient to be transferred to facility by: [place] Name of family member notified: Sophia [name] Patient and family notified of of transfer: 07/14/20  Discharge Plan and Services   Discharge Planning Services: CM Consult                                 Social Determinants of Health (SDOH) Interventions     Readmission Risk Interventions No flowsheet data found.

## 2020-07-14 NOTE — Progress Notes (Addendum)
Palliative- Received note from Rushie Goltz- that patient's son had questions regarding outpatient Palliative at [facility]. Discussed with Bryson Ha the need for outpatient Palliative as goals are to attempt rehab at [facility] and if patient declines then transition to comfort care and Hospice. Palliative at SNF can provide extra layer of support and monitor for decline.    I also called [name] and clarified the outpatient Palliative services that can be provided. Providing an extra layer of support for patient, monitoring for progress/decline and assisting with transition to Hospice when that is appropriate.  [name] agreed with plan.   Mariana Kaufman, AGNP-C Palliative Medicine  Non face to face encounter- 32 minutes  Greater than 50%  of this time was spent counseling and coordinating care related to the above assessment and plan.

## 2020-07-14 NOTE — Discharge Summary (Addendum)
Physician Discharge Summary  Sophia [name] UUV:[id number] DOB: March 06, 1928 DOA: 07/06/2020  PCP: [name], MD  Admit date: 07/06/2020 Discharge date: 07/14/2020  Time spent: 50 minutes  Recommendations for Outpatient Follow-up:  Patient with discharge to skilled nursing facility Continue oxygen 3 L/min, maintain O2 sats more than 92%   Discharge Diagnoses:  Principal Problem:   Acute on chronic hypoxemic respiratory failure Community-acquired pneumonia Active Problems:   Essential hypertension   GASTROESOPHAGEAL REFLUX DISEASE   Osteoarthritis   Pancytopenia (HCC)   CKD (chronic kidney disease), stage III (HCC)   Respiratory failure, chronic (HCC)   Elevated troponin   Discharge Condition: Stable  Diet recommendation: Heart healthy diet  Filed Weights   07/12/20 0500 07/13/20 0552 07/14/20 0451  Weight: 86 kg 86.5 kg 86.6 kg    History of present illness:   87 year old female with history of moderate aortic stenosis, chronic respiratory failure on 2 L of oxygen 20 to hospital with worsening of shortness of breath.  Chest x-ray showed left midlung interstitial thickening.  BNP was elevated at 800.  Hospital Course:   Acute on chronic hypoxemic respiratory failure -Resolved -Secondary to community-acquired pneumonia -Completed 5 days of antibiotic treatment in the hospital -Blood cultures are negative to date -Requiring 3 L/min of oxygen     Right knee pain -Significantly improved -Secondary to osteoarthritis -X-ray of right knee showed large effusion -Orthopedic surgery was consulted, she underwent arthrocentesis that showed 20,000 WBC mainly PMN, no crystals, gram stain negative.  Joint fluid culture is negative to date. -Patient was given naproxen 500 mg p.o. twice daily for 4 doses     Vitamin B12 deficiency -Vitamin B12: 142 -Was given vitamin B12 1000 mcg subcu daily for 4 days -Start vitamin B12 1000 mcg p.o. daily -Need to recheck B12 level in 6  to 8 weeks as outpatient     Hypertension -Blood pressure is stable -Continue verapamil     Acute kidney injury on CKD stage IIIa -Likely from overdiuresis -Creatinine is 1.13 today     Mild elevation of troponin -Troponin 39, 35 -No chest pain -Likely demand ischemia   Restless leg syndrome -Patient complains of restless legs not improved with current regimen -Continue Gabapentin twice a day as needed -Will increase Requip to 0.5 mg p.o. nightly    Procedures: Arthrocentesis of right knee  Consultations: Orthopedics  Discharge Exam: Vitals:   07/14/20 0451 07/14/20 1146  BP: 139/84 (!) 105/57  Pulse: 82 80  Resp: 16 18  Temp: (!) 97.4 F (36.3 C) 97.7 F (36.5 C)  SpO2: 100% 100%    General: Appears in no acute distress Cardiovascular: S1-S2, regular, no murmur auscultated Respiratory: Clear to auscultation bilaterally  Discharge Instructions   Discharge Instructions     Diet - low sodium heart healthy   Complete by: As directed    Increase activity slowly   Complete by: As directed       Allergies as of 07/14/2020       Reactions   Nutritional Supplements Anaphylaxis   [name] Shortness Of Breath   [name] [alendronate Sodium] Other (See Comments)   Reaction:  GI upset    Lipitor [atorvastatin] Other (See Comments)   Reaction:  Muscle aches    Pregabalin Nausea And Vomiting   [name] Nausea And Vomiting   Wasp Venom Hives        Medication List     TAKE these medications    acetaminophen 500 MG tablet Commonly known as:  TYLENOL Take 1,000 mg by mouth every 6 (six) hours as needed for mild pain or fever.   ALPRAZolam 0.25 MG tablet Commonly known as: XANAX Take 1 tablet (0.25 mg total) by mouth 3 (three) times daily as needed for up to 5 doses for anxiety or sleep. What changed:  when to take this reasons to take this   cyanocobalamin 1000 MCG tablet Take 1 tablet (1,000 mcg total) by mouth  daily. Start taking on: July 15, 2020   EPINEPHrine 0.3 mg/0.3 mL Soaj injection Commonly known as: EPI-PEN Inject 0.3 mg into the muscle once as needed (for severe allergic reaction).   gabapentin 400 MG capsule Commonly known as: NEURONTIN Take 2 capsules (800 mg total) by mouth 2 (two) times daily as needed (nerve pain).   hydroxypropyl methylcellulose / hypromellose 2.5 % ophthalmic solution Commonly known as: ISOPTO TEARS / GONIOVISC Place 1-2 drops into both eyes as needed for dry eyes.   OXYGEN Inhale 2 L into the lungs continuous.   Prolia 60 MG/ML Sosy injection Generic drug: denosumab Inject 60 mg into the skin every 6 (six) months.   rOPINIRole 0.5 MG tablet Commonly known as: REQUIP Take 1 tablet (0.5 mg total) by mouth every evening.   sertraline 100 MG tablet Commonly known as: ZOLOFT Take 100 mg by mouth daily.   verapamil 240 MG CR tablet Commonly known as: CALAN-SR Take 240 mg by mouth daily.       ASK your doctor about these medications    Dexilant 60 MG capsule Generic drug: dexlansoprazole TAKE 1 TABLET BY MOUTH DAILY EVERY MORNING.               Durable Medical Equipment  (From admission, onward)           Start     Ordered   07/09/20 0932  For home use only DME Hospital bed  Once       Question Answer Comment  Length of Need 12 Months   The above medical condition requires: Patient requires the ability to reposition frequently   Head must be elevated greater than: 45 degrees   Bed type Semi-electric      07/09/20 0932           Allergies  Allergen Reactions   Nutritional Supplements Anaphylaxis   [name] Shortness Of Breath   [name] [Alendronate Sodium] Other (See Comments)    Reaction:  GI upset    Lipitor [Atorvastatin] Other (See Comments)    Reaction:  Muscle aches    Pregabalin Nausea And Vomiting   [name] Nausea And Vomiting   Wasp Venom Hives    Contact information for follow-up  providers     [name], MD. Go on 08/23/2020.   Specialty: Internal Medicine Why: @10 :45am Contact information: [address] [place] [address] [phone]              Contact information for after-discharge care     Destination     [facility] .   Service: Skilled Nursing Contact information: [place] [phone]                      The results of significant diagnostics from this hospitalization (including imaging, microbiology, ancillary and laboratory) are listed below for reference.    Significant Diagnostic Studies: DG Knee 1-2 Views Right  Result Date: 07/10/2020 CLINICAL DATA:  Right knee pain for 2  days. EXAM: RIGHT KNEE - 1-2 VIEW COMPARISON:  Right knee radiographs dated 12/31/2007. FINDINGS: No evidence of acute fracture or dislocation. There is a moderate right knee joint effusion. There is moderate osteoarthritis of the lateral compartment and mild osteoarthritis of the medial and patellofemoral compartments. Soft tissues are unremarkable. IMPRESSION: Moderate knee joint effusion. Mild-to-moderate tricompartmental osteoarthritis. Electronically Signed   By: Zerita Boers M.D.   On: 07/10/2020 10:57   DG Pelvis Portable  Result Date: 07/06/2020 CLINICAL DATA:  Pain following fall EXAM: PORTABLE PELVIS 1-2 VIEWS COMPARISON:  None. FINDINGS: There is no evidence of pelvic fracture or dislocation. There is mild symmetric narrowing of each hip joint. No erosive change. Bone somewhat osteoporotic. Degenerative change noted in lower lumbar spine. IMPRESSION: No evident acute fracture or dislocation. Osteoporosis. Mild symmetric narrowing of each hip joint. Electronically Signed   By: Lowella Grip III M.D.   On: 07/06/2020 09:23   DG Chest Portable 1 View  Result Date: 07/06/2020 CLINICAL DATA:  Fall, shortness of breath, lethargy, shoulder pain EXAM: PORTABLE  CHEST 1 VIEW COMPARISON:  02/02/2017 FINDINGS: Stable cardiomegaly. Atherosclerotic calcification of the aortic knob. Hiatal hernia noted. Interstitial thickening, most pronounced within the left mid lung. No focal airspace consolidation, pleural effusion, or pneumothorax. IMPRESSION: 1. Interstitial thickening, most pronounced within the left mid lung. Findings favored to represent chronic bronchitic type lung changes. Superimposed mild edema or developing infection not excluded. 2. Cardiomegaly. 3. Hiatal hernia. Electronically Signed   By: Davina Poke D.O.   On: 07/06/2020 09:27   DG Shoulder Left Portable  Result Date: 07/06/2020 CLINICAL DATA:  Fall.  Shoulder pain. EXAM: LEFT SHOULDER COMPARISON:  No recent. FINDINGS: Acromioclavicular and glenohumeral degenerative change. No acute bony or joint abnormality. No evidence of fracture or dislocation. IMPRESSION: Acromioclavicular glenohumeral degenerative change. No acute bony abnormality. Electronically Signed   By: Marcello Moores  Register   On: 07/06/2020 09:30   ECHOCARDIOGRAM COMPLETE  Result Date: 07/06/2020    ECHOCARDIOGRAM REPORT   Patient Name:   Sophia [name] Date of Exam: 07/06/2020 Medical Rec #:  [id number]         Height:       63.0 in Accession #:    [id number]        Weight:       187.0 lb Date of Birth:  [birth year]         BSA:          1.879 m Patient Age:    61 years          BP:           134/78 mmHg Patient Gender: F                 HR:           84 bpm. Exam Location:  Inpatient Procedure: 2D Echo, Cardiac Doppler and Color Doppler Indications:    CHF  History:        Patient has no prior history of Echocardiogram examinations.                 CHF, Aortic Valve Disease; Risk Factors:Dyslipidemia and                 Hypertension.  Sonographer:    Cammy Brochure Referring Phys: [id number] RONDELL A SMITH  Sonographer Comments: Image acquisition challenging due to patient body habitus. IMPRESSIONS  1. Left ventricular ejection fraction, by  estimation, is 40 to 45%. The left ventricle  has mildly decreased function. The left ventricle demonstrates global hypokinesis. There is moderate left ventricular hypertrophy. Left ventricular diastolic parameters are indeterminate.  2. Right ventricular systolic function is normal. The right ventricular size is normal. Tricuspid regurgitation signal is inadequate for assessing PA pressure.  3. Left atrial size was mildly dilated.  4. The mitral valve is normal in structure. Trivial mitral valve regurgitation. No evidence of mitral stenosis.  5. The aortic valve is abnormal. There is severe calcifcation of the aortic valve. Aortic valve regurgitation is not visualized. The aortic valve has severe calcification with moderate aortic valve stenosis, however in the setting of low stroke volume index and DI of 0.20, this may represent low flow, low gradient severe aortic valve stenosis.. Aortic valve area, by VTI measures 0.62 cm. Aortic valve mean gradient measures 28.0 mmHg. Aortic valve Vmax measures 3.49 m/s. FINDINGS  Left Ventricle: Left ventricular ejection fraction, by estimation, is 40 to 45%. The left ventricle has mildly decreased function. The left ventricle demonstrates global hypokinesis. The left ventricular internal cavity size was normal in size. There is  moderate left ventricular hypertrophy. Abnormal (paradoxical) septal motion, consistent with left bundle branch block. Left ventricular diastolic parameters are indeterminate. Right Ventricle: The right ventricular size is normal. No increase in right ventricular wall thickness. Right ventricular systolic function is normal. Tricuspid regurgitation signal is inadequate for assessing PA pressure. Left Atrium: Left atrial size was mildly dilated. Right Atrium: Right atrial size was normal in size. Pericardium: There is no evidence of pericardial effusion. Mitral Valve: The mitral valve is normal in structure. Trivial mitral valve regurgitation. No  evidence of mitral valve stenosis. Tricuspid Valve: The tricuspid valve is normal in structure. Tricuspid valve regurgitation is trivial. No evidence of tricuspid stenosis. Aortic Valve: The aortic valve is abnormal. There is severe calcifcation of the aortic valve. Aortic valve regurgitation is not visualized. The aortic valve has severe calcification with moderate aortic valve stenosis, however in the setting of low stroke volume index and DI of 0.20, this may represent low flow, low gradient severe aortic valve stenosis. Aortic valve mean gradient measures 28.0 mmHg. Aortic valve peak gradient measures 48.6 mmHg. Aortic valve area, by VTI measures 0.62 cm. Pulmonic Valve: The pulmonic valve was normal in structure. Pulmonic valve regurgitation is trivial. No evidence of pulmonic stenosis. Aorta: The aortic root is normal in size and structure. Venous: The inferior vena cava was not well visualized. IAS/Shunts: No atrial level shunt detected by color flow Doppler.  LEFT VENTRICLE PLAX 2D LVIDd:         4.60 cm  Diastology LVIDs:         3.50 cm  LV e' medial:    6.09 cm/s LV PW:         1.30 cm  LV E/e' medial:  16.0 LV IVS:        1.50 cm  LV e' lateral:   5.55 cm/s LVOT diam:     2.00 cm  LV E/e' lateral: 17.6 LV SV:         43 LV SV Index:   23 LVOT Area:     3.14 cm  RIGHT VENTRICLE RV S prime:     12.70 cm/s LEFT ATRIUM             Index       RIGHT ATRIUM           Index LA diam:        3.90 cm 2.08 cm/m  RA Area:     13.10 cm LA Vol (A2C):   55.8 ml 29.69 ml/m RA Volume:   32.20 ml  17.14 ml/m LA Vol (A4C):   69.0 ml 36.72 ml/m LA Biplane Vol: 62.2 ml 33.10 ml/m  AORTIC VALVE AV Area (Vmax):    0.66 cm AV Area (Vmean):   0.76 cm AV Area (VTI):     0.62 cm AV Vmax:           348.70 cm/s AV Vmean:          204.000 cm/s AV VTI:            0.700 m AV Peak Grad:      48.6 mmHg AV Mean Grad:      28.0 mmHg LVOT Vmax:         73.70 cm/s LVOT Vmean:        49.300 cm/s LVOT VTI:          0.138 m LVOT/AV  VTI ratio: 0.20  AORTA Ao Root diam: 3.50 cm Ao Asc diam:  3.00 cm MITRAL VALVE MV Area (PHT): 5.23 cm    SHUNTS MV Decel Time: 145 msec    Systemic VTI:  0.14 m MV E velocity: 97.70 cm/s  Systemic Diam: 2.00 cm Cherlynn Kaiser MD Electronically signed by Cherlynn Kaiser MD Signature Date/Time: 07/06/2020/10:38:41 PM    Final     Microbiology: Recent Results (from the past 240 hour(s))  Resp Panel by RT-PCR (Flu A&B, Covid) Nasopharyngeal Swab     Status: None   Collection Time: 07/06/20  8:51 AM   Specimen: Nasopharyngeal Swab; Nasopharyngeal(NP) swabs in vial transport medium  Result Value Ref Range Status   SARS Coronavirus 2 by RT PCR NEGATIVE NEGATIVE Final    Comment: (NOTE) SARS-CoV-2 target nucleic acids are NOT DETECTED.  The SARS-CoV-2 RNA is generally detectable in upper respiratory specimens during the acute phase of infection. The lowest concentration of SARS-CoV-2 viral copies this assay can detect is 138 copies/mL. A negative result does not preclude SARS-Cov-2 infection and should not be used as the sole basis for treatment or other patient management decisions. A negative result may occur with  improper specimen collection/handling, submission of specimen other than nasopharyngeal swab, presence of viral mutation(s) within the areas targeted by this assay, and inadequate number of viral copies(<138 copies/mL). A negative result must be combined with clinical observations, patient history, and epidemiological information. The expected result is Negative.  Fact Sheet for Patients:  EntrepreneurPulse.com.au  Fact Sheet for Healthcare Providers:  IncredibleEmployment.be  This test is no t yet approved or cleared by the [place] FDA and  has been authorized for detection and/or diagnosis of SARS-CoV-2 by FDA under an Emergency Use Authorization (EUA). This EUA will remain  in effect (meaning this test can be used) for the duration  of the COVID-19 declaration under Section 564(b)(1) of the Act, 21 U.S.C.section 360bbb-3(b)(1), unless the authorization is terminated  or revoked sooner.       Influenza A by PCR NEGATIVE NEGATIVE Final   Influenza B by PCR NEGATIVE NEGATIVE Final    Comment: (NOTE) The Xpert Xpress SARS-CoV-2/FLU/RSV plus assay is intended as an aid in the diagnosis of influenza from Nasopharyngeal swab specimens and should not be used as a sole basis for treatment. Nasal washings and aspirates are unacceptable for Xpert Xpress SARS-CoV-2/FLU/RSV testing.  Fact Sheet for Patients: EntrepreneurPulse.com.au  Fact Sheet for Healthcare Providers: IncredibleEmployment.be  This test is not yet approved or cleared  by the [place] and has been authorized for detection and/or diagnosis of SARS-CoV-2 by FDA under an Emergency Use Authorization (EUA). This EUA will remain in effect (meaning this test can be used) for the duration of the COVID-19 declaration under Section 564(b)(1) of the Act, 21 U.S.C. section 360bbb-3(b)(1), unless the authorization is terminated or revoked.  Performed at [facility], [place] [address], [place], [place] [address]   Culture, blood (routine x 2) Call MD if unable to obtain prior to antibiotics being given     Status: None   Collection Time: 07/07/20  9:06 AM   Specimen: Left Antecubital; Blood  Result Value Ref Range Status   Specimen Description LEFT ANTECUBITAL  Final   Special Requests   Final    BOTTLES DRAWN AEROBIC AND ANAEROBIC Blood Culture results may not be optimal due to an inadequate volume of blood received in culture bottles   Culture   Final    NO GROWTH 5 DAYS Performed at [facility] [address], [place], [place] [address]    Report Status 07/12/2020 FINAL  Final  Culture, blood (routine x 2) Call MD if unable to obtain prior to antibiotics being given     Status: None   Collection Time:  07/07/20  9:09 AM   Specimen: BLOOD LEFT FOREARM  Result Value Ref Range Status   Specimen Description BLOOD LEFT FOREARM  Final   Special Requests   Final    BOTTLES DRAWN AEROBIC ONLY Blood Culture adequate volume   Culture   Final    NO GROWTH 5 DAYS Performed at [facility], [place] [address], [place], [place] [address]    Report Status 07/12/2020 FINAL  Final  Respiratory (~20 pathogens) panel by PCR     Status: None   Collection Time: 07/07/20 10:59 AM   Specimen: Nasopharyngeal Swab; Respiratory  Result Value Ref Range Status   Adenovirus NOT DETECTED NOT DETECTED Final   Coronavirus 229E NOT DETECTED NOT DETECTED Final    Comment: (NOTE) The Coronavirus on the Respiratory Panel, DOES NOT test for the novel  Coronavirus (2019 nCoV)    Coronavirus HKU1 NOT DETECTED NOT DETECTED Final   Coronavirus NL63 NOT DETECTED NOT DETECTED Final   Coronavirus OC43 NOT DETECTED NOT DETECTED Final   Metapneumovirus NOT DETECTED NOT DETECTED Final   Rhinovirus / Enterovirus NOT DETECTED NOT DETECTED Final   Influenza A NOT DETECTED NOT DETECTED Final   Influenza B NOT DETECTED NOT DETECTED Final   Parainfluenza Virus 1 NOT DETECTED NOT DETECTED Final   Parainfluenza Virus 2 NOT DETECTED NOT DETECTED Final   Parainfluenza Virus 3 NOT DETECTED NOT DETECTED Final   Parainfluenza Virus 4 NOT DETECTED NOT DETECTED Final   Respiratory Syncytial Virus NOT DETECTED NOT DETECTED Final   Bordetella pertussis NOT DETECTED NOT DETECTED Final   Bordetella Parapertussis NOT DETECTED NOT DETECTED Final   Chlamydophila pneumoniae NOT DETECTED NOT DETECTED Final   Mycoplasma pneumoniae NOT DETECTED NOT DETECTED Final    Comment: Performed at [facility], [place] [address], [place],  [address]  Body fluid culture w Gram Stain     Status: None   Collection Time: 07/11/20 10:02 AM   Specimen: Synovium; Body Fluid  Result Value Ref Range Status   Specimen Description SYNOVIAL  Final    Special Requests KNEE  Final   Gram Stain NO WBC SEEN NO ORGANISMS SEEN   Final   Culture  Final    NO GROWTH 3 DAYS Performed at [facility], [place] [address], [place], [place] [address]    Report Status 07/14/2020 FINAL  Final  SARS CORONAVIRUS 2 (TAT 6-24 HRS) Nasopharyngeal Nasopharyngeal Swab     Status: None   Collection Time: 07/12/20 11:09 AM   Specimen: Nasopharyngeal Swab  Result Value Ref Range Status   SARS Coronavirus 2 NEGATIVE NEGATIVE Final    Comment: (NOTE) SARS-CoV-2 target nucleic acids are NOT DETECTED.  The SARS-CoV-2 RNA is generally detectable in upper and lower respiratory specimens during the acute phase of infection. Negative results do not preclude SARS-CoV-2 infection, do not rule out co-infections with other pathogens, and should not be used as the sole basis for treatment or other patient management decisions. Negative results must be combined with clinical observations, patient history, and epidemiological information. The expected result is Negative.  Fact Sheet for Patients: SugarRoll.be  Fact Sheet for Healthcare Providers: https://www.woods-mathews.com/  This test is not yet approved or cleared by the [place] FDA and  has been authorized for detection and/or diagnosis of SARS-CoV-2 by FDA under an Emergency Use Authorization (EUA). This EUA will remain  in effect (meaning this test can be used) for the duration of the COVID-19 declaration under Se ction 564(b)(1) of the Act, 21 U.S.C. section 360bbb-3(b)(1), unless the authorization is terminated or revoked sooner.  Performed at [facility], [place] [address] [place], [place] [address]      Labs: Basic Metabolic Panel: Recent Labs  Lab 07/08/20 0415 07/09/20 0141 07/10/20 0419  NA 134* 137 135  K 3.6 3.6 3.8  CL 94* 103 104  CO2 28 24 24   GLUCOSE 110* 98 111*  BUN 35* 27* 24*  CREATININE 2.09* 1.31* 1.13*  CALCIUM  8.4* 7.6* 7.5*   Liver Function Tests: No results for input(s): AST, ALT, ALKPHOS, BILITOT, PROT, ALBUMIN in the last 168 hours. No results for input(s): LIPASE, AMYLASE in the last 168 hours. No results for input(s): AMMONIA in the last 168 hours. CBC: No results for input(s): WBC, NEUTROABS, HGB, HCT, MCV, PLT in the last 168 hours. Cardiac Enzymes: No results for input(s): CKTOTAL, CKMB, CKMBINDEX, TROPONINI in the last 168 hours. BNP: BNP (last 3 results) Recent Labs    07/06/20 0825  BNP 837.8*    ProBNP (last 3 results) No results for input(s): PROBNP in the last 8760 hours.  CBG: No results for input(s): GLUCAP in the last 168 hours.     Signed:  Oswald Hillock MD.  [facility] 07/14/2020, 11:50 AM

## 2020-07-15 DIAGNOSIS — R5381 Other malaise: Secondary | ICD-10-CM | POA: Diagnosis not present

## 2020-07-15 DIAGNOSIS — N184 Chronic kidney disease, stage 4 (severe): Secondary | ICD-10-CM | POA: Diagnosis not present

## 2020-07-15 DIAGNOSIS — M810 Age-related osteoporosis without current pathological fracture: Secondary | ICD-10-CM | POA: Diagnosis not present

## 2020-07-15 DIAGNOSIS — J9621 Acute and chronic respiratory failure with hypoxia: Secondary | ICD-10-CM | POA: Diagnosis not present

## 2020-08-11 DIAGNOSIS — I119 Hypertensive heart disease without heart failure: Secondary | ICD-10-CM | POA: Diagnosis not present

## 2020-08-11 DIAGNOSIS — K219 Gastro-esophageal reflux disease without esophagitis: Secondary | ICD-10-CM | POA: Diagnosis not present

## 2020-08-11 DIAGNOSIS — N1831 Chronic kidney disease, stage 3a: Secondary | ICD-10-CM | POA: Diagnosis not present

## 2020-08-11 DIAGNOSIS — M2550 Pain in unspecified joint: Secondary | ICD-10-CM | POA: Diagnosis not present

## 2020-08-18 DIAGNOSIS — F32A Depression, unspecified: Secondary | ICD-10-CM | POA: Diagnosis not present

## 2020-08-18 DIAGNOSIS — F411 Generalized anxiety disorder: Secondary | ICD-10-CM | POA: Diagnosis not present

## 2020-08-20 DIAGNOSIS — R0602 Shortness of breath: Secondary | ICD-10-CM | POA: Diagnosis not present

## 2020-08-29 DIAGNOSIS — M79674 Pain in right toe(s): Secondary | ICD-10-CM | POA: Diagnosis not present

## 2020-08-29 DIAGNOSIS — M2041 Other hammer toe(s) (acquired), right foot: Secondary | ICD-10-CM | POA: Diagnosis not present

## 2020-08-29 DIAGNOSIS — M79675 Pain in left toe(s): Secondary | ICD-10-CM | POA: Diagnosis not present

## 2020-08-29 DIAGNOSIS — J189 Pneumonia, unspecified organism: Secondary | ICD-10-CM | POA: Diagnosis not present

## 2020-08-29 DIAGNOSIS — I739 Peripheral vascular disease, unspecified: Secondary | ICD-10-CM | POA: Diagnosis not present

## 2020-08-29 DIAGNOSIS — B351 Tinea unguium: Secondary | ICD-10-CM | POA: Diagnosis not present

## 2020-08-29 DIAGNOSIS — R262 Difficulty in walking, not elsewhere classified: Secondary | ICD-10-CM | POA: Diagnosis not present

## 2020-09-03 DIAGNOSIS — J9621 Acute and chronic respiratory failure with hypoxia: Secondary | ICD-10-CM | POA: Diagnosis not present

## 2020-09-03 DIAGNOSIS — R5381 Other malaise: Secondary | ICD-10-CM | POA: Diagnosis not present

## 2020-09-03 DIAGNOSIS — F339 Major depressive disorder, recurrent, unspecified: Secondary | ICD-10-CM | POA: Diagnosis not present

## 2020-09-03 DIAGNOSIS — M810 Age-related osteoporosis without current pathological fracture: Secondary | ICD-10-CM | POA: Diagnosis not present

## 2020-10-04 DIAGNOSIS — F339 Major depressive disorder, recurrent, unspecified: Secondary | ICD-10-CM | POA: Diagnosis not present

## 2020-10-04 DIAGNOSIS — I119 Hypertensive heart disease without heart failure: Secondary | ICD-10-CM | POA: Diagnosis not present

## 2020-10-04 DIAGNOSIS — I5032 Chronic diastolic (congestive) heart failure: Secondary | ICD-10-CM | POA: Diagnosis not present

## 2020-10-04 DIAGNOSIS — K5900 Constipation, unspecified: Secondary | ICD-10-CM | POA: Diagnosis not present

## 2020-10-20 DIAGNOSIS — F411 Generalized anxiety disorder: Secondary | ICD-10-CM | POA: Diagnosis not present

## 2020-10-20 DIAGNOSIS — F5101 Primary insomnia: Secondary | ICD-10-CM | POA: Diagnosis not present

## 2020-10-20 DIAGNOSIS — F32A Depression, unspecified: Secondary | ICD-10-CM | POA: Diagnosis not present

## 2020-11-02 ENCOUNTER — Telehealth: Payer: Self-pay

## 2020-11-02 DIAGNOSIS — F339 Major depressive disorder, recurrent, unspecified: Secondary | ICD-10-CM | POA: Diagnosis not present

## 2020-11-02 DIAGNOSIS — J9610 Chronic respiratory failure, unspecified whether with hypoxia or hypercapnia: Secondary | ICD-10-CM | POA: Diagnosis not present

## 2020-11-02 DIAGNOSIS — R5381 Other malaise: Secondary | ICD-10-CM | POA: Diagnosis not present

## 2020-11-02 DIAGNOSIS — M2550 Pain in unspecified joint: Secondary | ICD-10-CM | POA: Diagnosis not present

## 2020-11-02 NOTE — Telephone Encounter (Signed)
Patient's Son called to cancel October 6th appointments and requests that appointments be rescheduled for December. He stated that his Mother is in a SNF and travel is difficult for her at this time. Hemoglobin was 13.7 in June. Provider and Scheduling notified.

## 2020-11-07 ENCOUNTER — Telehealth: Payer: Self-pay

## 2020-11-07 DIAGNOSIS — U071 COVID-19: Secondary | ICD-10-CM | POA: Diagnosis not present

## 2020-11-07 NOTE — Telephone Encounter (Signed)
Called and given below message to son. He will call 2 weeks prior to due labs so the office can fax needed labs to nursing home.

## 2020-11-07 NOTE — Telephone Encounter (Signed)
Yes, that is fine. 

## 2020-11-07 NOTE — Telephone Encounter (Signed)
Returned sons call. His Mom is at [facility] in [place] now. She had pneumonia and went 6/9. He canceled his Mom's appt for 10/6 at [facility]. She tested positive for covid today.  He is asking if the nursing home could do the labs that you need? 6 months from 6/1? She has cbc 6/1 while in the hospital, hgb 13.7.  Your thoughts?

## 2020-11-08 DIAGNOSIS — A409 Streptococcal sepsis, unspecified: Secondary | ICD-10-CM | POA: Diagnosis not present

## 2020-11-08 DIAGNOSIS — U071 COVID-19: Secondary | ICD-10-CM | POA: Diagnosis not present

## 2020-11-09 ENCOUNTER — Telehealth: Payer: Self-pay

## 2020-11-09 NOTE — Telephone Encounter (Signed)
Patient is also being treated for COVID, on various medications, and in quarantine for 10 days.

## 2020-11-09 NOTE — Telephone Encounter (Signed)
Received voicemail from patient's Son, [name] regarding latest hemoglobin results. Patient's Hemoglobin 9.1. Son verbalized concern regarding hemoglobin results, follow-up visit, and iron infusion. He would like a return call from "Nurse Hassan Rowan"

## 2020-11-10 ENCOUNTER — Encounter: Payer: Self-pay | Admitting: Hematology and Oncology

## 2020-11-10 ENCOUNTER — Other Ambulatory Visit: Payer: Medicare Other

## 2020-11-10 ENCOUNTER — Ambulatory Visit: Payer: Medicare Other

## 2020-11-10 ENCOUNTER — Ambulatory Visit: Payer: Medicare Other | Admitting: Hematology and Oncology

## 2020-11-10 NOTE — Telephone Encounter (Signed)
Called back. Sophia [name] tested positive for covid on 10/3. Due to covid the earliest that Sophia [name] can come into the [facility] is 10/24 for labs and IV iron. He verbalized understanding.  Sophia [name] no longer gets out bed, not even into a w/c. Sophia [name] will check on transportation at nursing home and thinks maybe they should go to the cancer center in [place]. But he is not sure and wants to think about it. He tested positive for covid and will call the office back with what he decides.

## 2020-11-11 DIAGNOSIS — A4189 Other specified sepsis: Secondary | ICD-10-CM | POA: Diagnosis present

## 2020-11-11 DIAGNOSIS — J9621 Acute and chronic respiratory failure with hypoxia: Secondary | ICD-10-CM | POA: Diagnosis present

## 2020-11-11 DIAGNOSIS — R0902 Hypoxemia: Secondary | ICD-10-CM | POA: Diagnosis not present

## 2020-11-11 DIAGNOSIS — R0600 Dyspnea, unspecified: Secondary | ICD-10-CM | POA: Diagnosis not present

## 2020-11-11 DIAGNOSIS — R069 Unspecified abnormalities of breathing: Secondary | ICD-10-CM | POA: Diagnosis not present

## 2020-11-11 DIAGNOSIS — K219 Gastro-esophageal reflux disease without esophagitis: Secondary | ICD-10-CM | POA: Diagnosis present

## 2020-11-11 DIAGNOSIS — J1282 Pneumonia due to coronavirus disease 2019: Secondary | ICD-10-CM | POA: Diagnosis present

## 2020-11-11 DIAGNOSIS — R778 Other specified abnormalities of plasma proteins: Secondary | ICD-10-CM | POA: Diagnosis present

## 2020-11-11 DIAGNOSIS — J440 Chronic obstructive pulmonary disease with acute lower respiratory infection: Secondary | ICD-10-CM | POA: Diagnosis present

## 2020-11-11 DIAGNOSIS — Z743 Need for continuous supervision: Secondary | ICD-10-CM | POA: Diagnosis not present

## 2020-11-11 DIAGNOSIS — I272 Pulmonary hypertension, unspecified: Secondary | ICD-10-CM | POA: Diagnosis present

## 2020-11-11 DIAGNOSIS — U071 COVID-19: Secondary | ICD-10-CM | POA: Diagnosis present

## 2020-11-11 DIAGNOSIS — I080 Rheumatic disorders of both mitral and aortic valves: Secondary | ICD-10-CM | POA: Diagnosis present

## 2020-11-11 DIAGNOSIS — Z8701 Personal history of pneumonia (recurrent): Secondary | ICD-10-CM | POA: Diagnosis not present

## 2020-11-11 DIAGNOSIS — I110 Hypertensive heart disease with heart failure: Secondary | ICD-10-CM | POA: Diagnosis present

## 2020-11-11 DIAGNOSIS — Z888 Allergy status to other drugs, medicaments and biological substances status: Secondary | ICD-10-CM | POA: Diagnosis not present

## 2020-11-11 DIAGNOSIS — F329 Major depressive disorder, single episode, unspecified: Secondary | ICD-10-CM | POA: Diagnosis present

## 2020-11-11 DIAGNOSIS — Z79899 Other long term (current) drug therapy: Secondary | ICD-10-CM | POA: Diagnosis not present

## 2020-11-11 DIAGNOSIS — R011 Cardiac murmur, unspecified: Secondary | ICD-10-CM | POA: Diagnosis present

## 2020-11-11 DIAGNOSIS — I5023 Acute on chronic systolic (congestive) heart failure: Secondary | ICD-10-CM | POA: Diagnosis present

## 2020-11-11 DIAGNOSIS — Z66 Do not resuscitate: Secondary | ICD-10-CM | POA: Diagnosis present

## 2020-11-11 DIAGNOSIS — D509 Iron deficiency anemia, unspecified: Secondary | ICD-10-CM | POA: Diagnosis present

## 2020-11-11 DIAGNOSIS — M1990 Unspecified osteoarthritis, unspecified site: Secondary | ICD-10-CM | POA: Diagnosis present

## 2020-11-11 DIAGNOSIS — G2581 Restless legs syndrome: Secondary | ICD-10-CM | POA: Diagnosis present

## 2020-11-18 ENCOUNTER — Telehealth: Payer: Self-pay

## 2020-11-18 NOTE — Telephone Encounter (Signed)
Called son back and given below message. He will have the nursing home recheck labs around 1/13 and call the office back with results.

## 2020-11-18 NOTE — Telephone Encounter (Signed)
If it is at 12.6 now she can wait 3 months

## 2020-11-18 NOTE — Telephone Encounter (Signed)
Sophia [name], son called and left a message. Sophia [name] is in the hospital with Covid at [facility]. Hgb yesterday was 12.6 at the hospital.  He is asking when should the nursing home recheck her labs and he will call the office back with results?

## 2020-11-22 DIAGNOSIS — J189 Pneumonia, unspecified organism: Secondary | ICD-10-CM | POA: Diagnosis not present

## 2020-11-22 DIAGNOSIS — I509 Heart failure, unspecified: Secondary | ICD-10-CM | POA: Diagnosis not present

## 2020-11-22 DIAGNOSIS — U071 COVID-19: Secondary | ICD-10-CM | POA: Diagnosis not present

## 2020-11-22 DIAGNOSIS — J9611 Chronic respiratory failure with hypoxia: Secondary | ICD-10-CM | POA: Diagnosis not present

## 2020-12-16 DIAGNOSIS — I10 Essential (primary) hypertension: Secondary | ICD-10-CM | POA: Diagnosis not present

## 2020-12-16 DIAGNOSIS — I509 Heart failure, unspecified: Secondary | ICD-10-CM | POA: Diagnosis not present

## 2020-12-16 DIAGNOSIS — N183 Chronic kidney disease, stage 3 unspecified: Secondary | ICD-10-CM | POA: Diagnosis not present

## 2020-12-27 DIAGNOSIS — I272 Pulmonary hypertension, unspecified: Secondary | ICD-10-CM | POA: Diagnosis not present

## 2020-12-27 DIAGNOSIS — J9611 Chronic respiratory failure with hypoxia: Secondary | ICD-10-CM | POA: Diagnosis not present

## 2020-12-27 DIAGNOSIS — M6281 Muscle weakness (generalized): Secondary | ICD-10-CM | POA: Diagnosis not present

## 2020-12-27 DIAGNOSIS — F339 Major depressive disorder, recurrent, unspecified: Secondary | ICD-10-CM | POA: Diagnosis not present

## 2020-12-27 DIAGNOSIS — G603 Idiopathic progressive neuropathy: Secondary | ICD-10-CM | POA: Diagnosis not present

## 2020-12-27 DIAGNOSIS — I350 Nonrheumatic aortic (valve) stenosis: Secondary | ICD-10-CM | POA: Diagnosis not present

## 2020-12-27 DIAGNOSIS — N183 Chronic kidney disease, stage 3 unspecified: Secondary | ICD-10-CM | POA: Diagnosis not present

## 2020-12-27 DIAGNOSIS — F419 Anxiety disorder, unspecified: Secondary | ICD-10-CM | POA: Diagnosis not present

## 2020-12-27 DIAGNOSIS — D519 Vitamin B12 deficiency anemia, unspecified: Secondary | ICD-10-CM | POA: Diagnosis not present

## 2020-12-27 DIAGNOSIS — E785 Hyperlipidemia, unspecified: Secondary | ICD-10-CM | POA: Diagnosis not present

## 2020-12-27 DIAGNOSIS — I509 Heart failure, unspecified: Secondary | ICD-10-CM | POA: Diagnosis not present

## 2020-12-27 DIAGNOSIS — R54 Age-related physical debility: Secondary | ICD-10-CM | POA: Diagnosis not present

## 2020-12-30 DIAGNOSIS — L853 Xerosis cutis: Secondary | ICD-10-CM | POA: Diagnosis not present

## 2020-12-30 DIAGNOSIS — M6281 Muscle weakness (generalized): Secondary | ICD-10-CM | POA: Diagnosis not present

## 2020-12-30 DIAGNOSIS — G603 Idiopathic progressive neuropathy: Secondary | ICD-10-CM | POA: Diagnosis not present

## 2020-12-30 DIAGNOSIS — R54 Age-related physical debility: Secondary | ICD-10-CM | POA: Diagnosis not present

## 2020-12-30 DIAGNOSIS — F419 Anxiety disorder, unspecified: Secondary | ICD-10-CM | POA: Diagnosis not present

## 2020-12-30 DIAGNOSIS — J9611 Chronic respiratory failure with hypoxia: Secondary | ICD-10-CM | POA: Diagnosis not present

## 2020-12-30 DIAGNOSIS — R21 Rash and other nonspecific skin eruption: Secondary | ICD-10-CM | POA: Diagnosis not present

## 2020-12-30 DIAGNOSIS — I272 Pulmonary hypertension, unspecified: Secondary | ICD-10-CM | POA: Diagnosis not present

## 2020-12-30 DIAGNOSIS — I350 Nonrheumatic aortic (valve) stenosis: Secondary | ICD-10-CM | POA: Diagnosis not present

## 2020-12-30 DIAGNOSIS — N183 Chronic kidney disease, stage 3 unspecified: Secondary | ICD-10-CM | POA: Diagnosis not present

## 2020-12-30 DIAGNOSIS — F339 Major depressive disorder, recurrent, unspecified: Secondary | ICD-10-CM | POA: Diagnosis not present

## 2020-12-30 DIAGNOSIS — I509 Heart failure, unspecified: Secondary | ICD-10-CM | POA: Diagnosis not present

## 2021-01-02 DIAGNOSIS — I5032 Chronic diastolic (congestive) heart failure: Secondary | ICD-10-CM | POA: Diagnosis not present

## 2021-01-02 DIAGNOSIS — K5900 Constipation, unspecified: Secondary | ICD-10-CM | POA: Diagnosis not present

## 2021-01-02 DIAGNOSIS — R5381 Other malaise: Secondary | ICD-10-CM | POA: Diagnosis not present

## 2021-01-02 DIAGNOSIS — F339 Major depressive disorder, recurrent, unspecified: Secondary | ICD-10-CM | POA: Diagnosis not present

## 2021-01-03 DIAGNOSIS — R21 Rash and other nonspecific skin eruption: Secondary | ICD-10-CM | POA: Diagnosis not present

## 2021-01-03 DIAGNOSIS — L853 Xerosis cutis: Secondary | ICD-10-CM | POA: Diagnosis not present

## 2021-01-05 DIAGNOSIS — I350 Nonrheumatic aortic (valve) stenosis: Secondary | ICD-10-CM | POA: Diagnosis not present

## 2021-01-05 DIAGNOSIS — G603 Idiopathic progressive neuropathy: Secondary | ICD-10-CM | POA: Diagnosis not present

## 2021-01-05 DIAGNOSIS — M810 Age-related osteoporosis without current pathological fracture: Secondary | ICD-10-CM | POA: Diagnosis not present

## 2021-01-05 DIAGNOSIS — J9611 Chronic respiratory failure with hypoxia: Secondary | ICD-10-CM | POA: Diagnosis not present

## 2021-01-05 DIAGNOSIS — N183 Chronic kidney disease, stage 3 unspecified: Secondary | ICD-10-CM | POA: Diagnosis not present

## 2021-01-05 DIAGNOSIS — F5101 Primary insomnia: Secondary | ICD-10-CM | POA: Diagnosis not present

## 2021-01-05 DIAGNOSIS — F411 Generalized anxiety disorder: Secondary | ICD-10-CM | POA: Diagnosis not present

## 2021-01-05 DIAGNOSIS — I509 Heart failure, unspecified: Secondary | ICD-10-CM | POA: Diagnosis not present

## 2021-01-05 DIAGNOSIS — R54 Age-related physical debility: Secondary | ICD-10-CM | POA: Diagnosis not present

## 2021-01-05 DIAGNOSIS — F419 Anxiety disorder, unspecified: Secondary | ICD-10-CM | POA: Diagnosis not present

## 2021-01-05 DIAGNOSIS — E785 Hyperlipidemia, unspecified: Secondary | ICD-10-CM | POA: Diagnosis not present

## 2021-01-05 DIAGNOSIS — I272 Pulmonary hypertension, unspecified: Secondary | ICD-10-CM | POA: Diagnosis not present

## 2021-01-05 DIAGNOSIS — F339 Major depressive disorder, recurrent, unspecified: Secondary | ICD-10-CM | POA: Diagnosis not present

## 2021-01-05 DIAGNOSIS — F32A Depression, unspecified: Secondary | ICD-10-CM | POA: Diagnosis not present

## 2021-01-05 DIAGNOSIS — L853 Xerosis cutis: Secondary | ICD-10-CM | POA: Diagnosis not present

## 2021-01-10 DIAGNOSIS — M810 Age-related osteoporosis without current pathological fracture: Secondary | ICD-10-CM | POA: Diagnosis not present

## 2021-01-10 DIAGNOSIS — R54 Age-related physical debility: Secondary | ICD-10-CM | POA: Diagnosis not present

## 2021-01-10 DIAGNOSIS — I509 Heart failure, unspecified: Secondary | ICD-10-CM | POA: Diagnosis not present

## 2021-01-10 DIAGNOSIS — J9611 Chronic respiratory failure with hypoxia: Secondary | ICD-10-CM | POA: Diagnosis not present

## 2021-01-10 DIAGNOSIS — G603 Idiopathic progressive neuropathy: Secondary | ICD-10-CM | POA: Diagnosis not present

## 2021-01-10 DIAGNOSIS — E785 Hyperlipidemia, unspecified: Secondary | ICD-10-CM | POA: Diagnosis not present

## 2021-01-10 DIAGNOSIS — F419 Anxiety disorder, unspecified: Secondary | ICD-10-CM | POA: Diagnosis not present

## 2021-01-10 DIAGNOSIS — F339 Major depressive disorder, recurrent, unspecified: Secondary | ICD-10-CM | POA: Diagnosis not present

## 2021-01-10 DIAGNOSIS — N183 Chronic kidney disease, stage 3 unspecified: Secondary | ICD-10-CM | POA: Diagnosis not present

## 2021-01-10 DIAGNOSIS — M6281 Muscle weakness (generalized): Secondary | ICD-10-CM | POA: Diagnosis not present

## 2021-01-10 DIAGNOSIS — I350 Nonrheumatic aortic (valve) stenosis: Secondary | ICD-10-CM | POA: Diagnosis not present

## 2021-01-10 DIAGNOSIS — L853 Xerosis cutis: Secondary | ICD-10-CM | POA: Diagnosis not present

## 2021-05-08 ENCOUNTER — Telehealth: Payer: Self-pay

## 2021-05-08 NOTE — Telephone Encounter (Signed)
It's hard to predict the future ?Just call us again if she becomes anemic ?I will cancel all her orders ?

## 2021-05-08 NOTE — Telephone Encounter (Signed)
Called and given below message to [place]. He verbalized understanding and appreciated the call. ?

## 2021-05-08 NOTE — Telephone Encounter (Signed)
Called son to see how his Mom is doing. She is doing well. She has been in the nursing home since June of 2022. She has a lot of health issues. She is not mobile and does not get out of the bed. She is afraid of falling and refuses to get of bed. ?Per [name] the nursing home is taking good care of his Mom and does lab work prn. ?He is asking what would her HGB need to be at for her to get IV iron again? Or would they just need to monitor? ?

## 2021-05-18 ENCOUNTER — Telehealth: Payer: Self-pay

## 2021-05-18 NOTE — Telephone Encounter (Signed)
Mr Shroff stated that he saw his mom a couple of days ago and told her that Dr. Alvy Bimler and Hassan Rowan were following up with him tho see how she was doing. She was so delighted and grateful.  They want to thank you again for helping get her situation under control. ?Her Hgb currently is 11.4. ?
